# Patient Record
Sex: Female | Born: 1935 | Race: White | Hispanic: No | Smoking: Never smoker
Health system: Southern US, Community
[De-identification: ages and names within clinical notes are randomized; demographics above are authoritative.]

## PROBLEM LIST (undated history)

## (undated) DIAGNOSIS — R001 Bradycardia, unspecified: Secondary | ICD-10-CM

## (undated) DIAGNOSIS — E7800 Pure hypercholesterolemia, unspecified: Secondary | ICD-10-CM

## (undated) DIAGNOSIS — I10 Essential (primary) hypertension: Secondary | ICD-10-CM

## (undated) DIAGNOSIS — I219 Acute myocardial infarction, unspecified: Secondary | ICD-10-CM

## (undated) DIAGNOSIS — Z87898 Personal history of other specified conditions: Secondary | ICD-10-CM

## (undated) DIAGNOSIS — Z95820 Peripheral vascular angioplasty status with implants and grafts: Secondary | ICD-10-CM

## (undated) DIAGNOSIS — I5189 Other ill-defined heart diseases: Secondary | ICD-10-CM

## (undated) DIAGNOSIS — R413 Other amnesia: Secondary | ICD-10-CM

## (undated) DIAGNOSIS — I447 Left bundle-branch block, unspecified: Secondary | ICD-10-CM

## (undated) DIAGNOSIS — I6523 Occlusion and stenosis of bilateral carotid arteries: Secondary | ICD-10-CM

## (undated) DIAGNOSIS — E1140 Type 2 diabetes mellitus with diabetic neuropathy, unspecified: Secondary | ICD-10-CM

## (undated) DIAGNOSIS — K2990 Gastroduodenitis, unspecified, without bleeding: Secondary | ICD-10-CM

## (undated) DIAGNOSIS — I351 Nonrheumatic aortic (valve) insufficiency: Secondary | ICD-10-CM

## (undated) DIAGNOSIS — Z974 Presence of external hearing-aid: Secondary | ICD-10-CM

## (undated) HISTORY — PX: CORONARY ANGIOPLASTY WITH STENT PLACEMENT: SHX49

## (undated) HISTORY — PX: APPENDECTOMY: SHX54

## (undated) HISTORY — PX: ABDOMINAL HYSTERECTOMY: SHX81

---

## 2004-07-17 ENCOUNTER — Ambulatory Visit: Payer: Self-pay | Admitting: Internal Medicine

## 2005-09-18 ENCOUNTER — Ambulatory Visit: Payer: Self-pay | Admitting: Internal Medicine

## 2006-04-20 DIAGNOSIS — I219 Acute myocardial infarction, unspecified: Secondary | ICD-10-CM

## 2006-04-20 HISTORY — DX: Acute myocardial infarction, unspecified: I21.9

## 2006-08-11 ENCOUNTER — Ambulatory Visit: Payer: Self-pay | Admitting: Gastroenterology

## 2006-09-20 ENCOUNTER — Ambulatory Visit: Payer: Self-pay | Admitting: Internal Medicine

## 2006-10-24 ENCOUNTER — Other Ambulatory Visit: Payer: Self-pay

## 2006-10-25 ENCOUNTER — Other Ambulatory Visit: Payer: Self-pay

## 2006-10-25 ENCOUNTER — Inpatient Hospital Stay: Payer: Self-pay | Admitting: Cardiology

## 2006-10-26 ENCOUNTER — Other Ambulatory Visit: Payer: Self-pay

## 2006-12-06 ENCOUNTER — Other Ambulatory Visit: Payer: Self-pay

## 2006-12-06 ENCOUNTER — Inpatient Hospital Stay: Payer: Self-pay | Admitting: Specialist

## 2006-12-07 ENCOUNTER — Other Ambulatory Visit: Payer: Self-pay

## 2006-12-08 ENCOUNTER — Other Ambulatory Visit: Payer: Self-pay

## 2006-12-22 ENCOUNTER — Other Ambulatory Visit: Payer: Self-pay

## 2006-12-22 ENCOUNTER — Inpatient Hospital Stay: Payer: Self-pay | Admitting: Internal Medicine

## 2007-01-05 ENCOUNTER — Encounter: Payer: Self-pay | Admitting: Cardiology

## 2007-01-19 ENCOUNTER — Encounter: Payer: Self-pay | Admitting: Cardiology

## 2007-02-19 ENCOUNTER — Encounter: Payer: Self-pay | Admitting: Cardiology

## 2007-03-15 ENCOUNTER — Ambulatory Visit: Payer: Self-pay | Admitting: Internal Medicine

## 2007-03-21 ENCOUNTER — Encounter: Payer: Self-pay | Admitting: Cardiology

## 2007-04-21 ENCOUNTER — Encounter: Payer: Self-pay | Admitting: Cardiology

## 2007-09-22 ENCOUNTER — Ambulatory Visit: Payer: Self-pay | Admitting: Internal Medicine

## 2008-09-24 ENCOUNTER — Ambulatory Visit: Payer: Self-pay | Admitting: Internal Medicine

## 2009-02-01 ENCOUNTER — Inpatient Hospital Stay: Payer: Self-pay | Admitting: Internal Medicine

## 2009-09-25 ENCOUNTER — Ambulatory Visit: Payer: Self-pay | Admitting: Internal Medicine

## 2009-11-29 ENCOUNTER — Ambulatory Visit: Payer: Self-pay | Admitting: Internal Medicine

## 2010-05-24 ENCOUNTER — Inpatient Hospital Stay: Payer: Self-pay | Admitting: Internal Medicine

## 2010-09-29 ENCOUNTER — Ambulatory Visit: Payer: Self-pay | Admitting: Internal Medicine

## 2010-10-06 ENCOUNTER — Ambulatory Visit: Payer: Self-pay | Admitting: Internal Medicine

## 2010-12-30 ENCOUNTER — Inpatient Hospital Stay: Payer: Self-pay | Admitting: Internal Medicine

## 2011-05-17 ENCOUNTER — Emergency Department: Payer: Self-pay | Admitting: *Deleted

## 2011-05-18 LAB — COMPREHENSIVE METABOLIC PANEL
Alkaline Phosphatase: 94 U/L (ref 50–136)
Bilirubin,Total: 0.3 mg/dL (ref 0.2–1.0)
Chloride: 103 mmol/L (ref 98–107)
Co2: 28 mmol/L (ref 21–32)
Creatinine: 1.06 mg/dL (ref 0.60–1.30)
EGFR (Non-African Amer.): 53 — ABNORMAL LOW
Osmolality: 288 (ref 275–301)
Sodium: 142 mmol/L (ref 136–145)
Total Protein: 7.6 g/dL (ref 6.4–8.2)

## 2011-05-18 LAB — CBC
HCT: 40.2 % (ref 35.0–47.0)
HGB: 13.7 g/dL (ref 12.0–16.0)
MCH: 33.1 pg (ref 26.0–34.0)
MCHC: 34.1 g/dL (ref 32.0–36.0)
RBC: 4.15 10*6/uL (ref 3.80–5.20)

## 2011-05-18 LAB — CK TOTAL AND CKMB (NOT AT ARMC): CK-MB: 2.2 ng/mL (ref 0.5–3.6)

## 2011-05-18 LAB — TROPONIN I: Troponin-I: <0.02 ng/mL

## 2011-09-30 ENCOUNTER — Ambulatory Visit: Payer: Self-pay | Admitting: Internal Medicine

## 2011-12-08 ENCOUNTER — Encounter (HOSPITAL_COMMUNITY): Payer: Self-pay | Admitting: *Deleted

## 2011-12-08 ENCOUNTER — Emergency Department (HOSPITAL_COMMUNITY)
Admission: EM | Admit: 2011-12-08 | Discharge: 2011-12-08 | Disposition: A | Discharge status: Home / Self Care | Payer: Medicare Other | Attending: Emergency Medicine | Admitting: Emergency Medicine

## 2011-12-08 DIAGNOSIS — I10 Essential (primary) hypertension: Secondary | ICD-10-CM | POA: Insufficient documentation

## 2011-12-08 DIAGNOSIS — Z9861 Coronary angioplasty status: Secondary | ICD-10-CM | POA: Insufficient documentation

## 2011-12-08 DIAGNOSIS — E7800 Pure hypercholesterolemia, unspecified: Secondary | ICD-10-CM | POA: Insufficient documentation

## 2011-12-08 DIAGNOSIS — I252 Old myocardial infarction: Secondary | ICD-10-CM | POA: Insufficient documentation

## 2011-12-08 HISTORY — DX: Pure hypercholesterolemia, unspecified: E78.00

## 2011-12-08 HISTORY — DX: Essential (primary) hypertension: I10

## 2011-12-08 HISTORY — DX: Peripheral vascular angioplasty status with implants and grafts: Z95.820

## 2011-12-08 HISTORY — DX: Acute myocardial infarction, unspecified: I21.9

## 2011-12-08 LAB — CBC
HCT: 39.6 % (ref 36.0–46.0)
Hemoglobin: 14.1 g/dL (ref 12.0–15.0)
MCH: 32.9 pg (ref 26.0–34.0)
MCHC: 35.6 g/dL (ref 30.0–36.0)
MCV: 92.3 fL (ref 78.0–100.0)
Platelets: 323 10*3/uL (ref 150–400)
RBC: 4.29 MIL/uL (ref 3.87–5.11)
RDW: 13.7 % (ref 11.5–15.5)
WBC: 8 10*3/uL (ref 4.0–10.5)

## 2011-12-08 LAB — POCT I-STAT, CHEM 8
Calcium, Ion: 1.22 mmol/L (ref 1.13–1.30)
Chloride: 105 mEq/L (ref 96–112)
HCT: 42 % (ref 36.0–46.0)
Sodium: 142 mEq/L (ref 135–145)

## 2011-12-08 NOTE — ED Provider Notes (Signed)
History     CSN: [id number]  Arrival date & time 12/08/11  2008   First MD Initiated Contact with Patient 12/08/11 2212      Chief Complaint  Patient presents with  . Hypertension    (Consider location/radiation/quality/duration/timing/severity/associated sxs/prior treatment) Patient is a 78 y.o. female presenting with hypertension. The history is provided by the patient.  Hypertension This is a chronic problem. The current episode started more than 1 year ago. The problem occurs intermittently. The problem has been unchanged. Associated symptoms include headaches (frontal). Pertinent negatives include no abdominal pain, chest pain, chills, fever, nausea, urinary symptoms, vertigo, vomiting or weakness. Associated symptoms comments: No shortness of breath.. Exacerbated by: unknown.  Pt has had difficult to control blood pressure for greater than a year.  She has been in the hospital with her sister for the past 2 days.  Over the course of the past 24 hours she has had SBP from the 140's-230.  Past Medical History  Diagnosis Date  . S/P angioplasty with stent   . Myocardial infarction 2008  . Hypertension   . Hypercholesteremia     Past Surgical History  Procedure Date  . Coronary angioplasty with stent placement   . Abdominal hysterectomy   . Appendectomy     No family history on file.  History  Substance Use Topics  . Smoking status: Never Smoker   . Smokeless tobacco: Not on file  . Alcohol Use: No    OB History    Grav Para Term Preterm Abortions TAB SAB Ect Mult Living                  Review of Systems  Constitutional: Negative for fever and chills.  Eyes: Negative.  Negative for visual disturbance.  Respiratory: Negative.  Negative for shortness of breath.   Cardiovascular: Negative for chest pain, palpitations and leg swelling.  Gastrointestinal: Negative.  Negative for nausea, vomiting, abdominal pain, diarrhea and constipation.  Genitourinary:  Negative.  Negative for dysuria, urgency, frequency and difficulty urinating.  Musculoskeletal: Negative.   Skin: Negative.   Neurological: Positive for headaches (frontal). Negative for vertigo and weakness.  All other systems reviewed and are negative.    Allergies  Terbinafine and related  Home Medications   Current Outpatient Rx  Name Route Sig Dispense Refill  . ACETAMINOPHEN 500 MG PO TABS Oral Take 500 mg by mouth every 6 (six) hours as needed. For pain    . ALPRAZOLAM 0.25 MG PO TABS Oral Take 0.125 mg by mouth 2 (two) times daily.    . ASPIRIN EC 81 MG PO TBEC Oral Take 81 mg by mouth daily.    Marland Kitchen CLONIDINE HCL 0.1 MG/24HR TD PTWK Transdermal Place 1 patch onto the skin once a week.    Marland Kitchen CLONIDINE HCL 0.1 MG PO TABS Oral Take 0.1 mg by mouth daily.    Marland Kitchen CLOPIDOGREL BISULFATE 75 MG PO TABS Oral Take 75 mg by mouth daily.    Marland Kitchen HYDRALAZINE HCL 50 MG PO TABS Oral Take 50 mg by mouth 2 (two) times daily.    . IBUPROFEN 200 MG PO TABS Oral Take 200 mg by mouth every 6 (six) hours as needed. For pain    . ISOSORBIDE MONONITRATE ER 60 MG PO TB24 Oral Take 60 mg by mouth daily.    Marland Kitchen LANSOPRAZOLE 30 MG PO CPDR Oral Take 30 mg by mouth daily.    Marland Kitchen NITROGLYCERIN 0.4 MG SL SUBL Sublingual Place 0.4 mg under  the tongue every 5 (five) minutes as needed. For chest pain    . OMEGA-3-ACID ETHYL ESTERS 1 G PO CAPS Oral Take 1 g by mouth daily.    Marland Kitchen ROSUVASTATIN CALCIUM 20 MG PO TABS Oral Take 20 mg by mouth daily.    Marland Kitchen TIMOLOL HEMIHYDRATE 0.5 % OP SOLN Both Eyes Place 1 drop into both eyes every evening.    . TORSEMIDE 10 MG PO TABS Oral Take 10 mg by mouth every morning.    Marland Kitchen VERAPAMIL HCL ER 180 MG PO CP24 Oral Take 180 mg by mouth at bedtime.      BP 199/75  Pulse 99  Temp 98.3 F (36.8 C) (Oral)  Resp 18  SpO2 100%  Physical Exam  Nursing note and vitals reviewed. Constitutional: She is oriented to person, place, and time. She appears well-developed and well-nourished. No distress.   HENT:  Head: Normocephalic and atraumatic.  Eyes: Conjunctivae and EOM are normal. Pupils are equal, round, and reactive to light.  Fundoscopic exam:      The right eye shows no hemorrhage and no papilledema.       The left eye shows no hemorrhage and no papilledema.  Neck: Neck supple.  Cardiovascular: Normal rate, regular rhythm, normal heart sounds and intact distal pulses.   Pulmonary/Chest: Effort normal and breath sounds normal. She has no wheezes. She has no rales.  Abdominal: Soft. She exhibits no distension. There is no tenderness.  Musculoskeletal: Normal range of motion.  Neurological: She is alert and oriented to person, place, and time. She has normal strength. No cranial nerve deficit or sensory deficit. Coordination and gait normal.  Skin: Skin is warm and dry.    ED Course  Procedures (including critical care time)  Labs Reviewed  POCT I-STAT, CHEM 8 - Abnormal; Notable for the following:    Glucose, Bld 144 (*)     All other components within normal limits  CBC   No results found.   1. Hypertension       MDM  78 yo female with PMHx of poorly controlled HTN presents to the ED for hypertension.  She has been staying with her sister in the hospital for the past 2 days and has not taken her blood pressure medication at consistent times.  Over the past 24 hours SBP has ranged from 140-230.  RN on the floor took BP and recommended she come to the ED.  Pt complains only of mild frontal headache.  No visual changes, chest pain, shortness of breath, abdominal pain, urinary sx, or focal neurologic deficits.  AF, BP 190's/70's at time of exam, NAD.  Physical exam unremarkable with nml neurologic exam.  Nml fundoscopic exam w/o signs of hemorrhage or papilledema.  Cr wnl.  No signs of end organ damage to suggest hypertensive emergency.  Given history of labile blood pressure and no signs of hypertensive emergency, will not give antihypertensives at this time.  Recommend close  PCP follow-up for further evaluation and management of blood pressure.  Tx plan discussed with pt who voiced understanding and will follow-up.  Strong return precautions provided.        Cherre Robins, MD 12/09/11 [phone]

## 2011-12-08 NOTE — ED Notes (Signed)
Pt has been on the 3rd floor with her sick sister since yesterday. Her bp has been fluctuating b/w systolic 140's and 230's.  The RN took her bp upstairs and it was 238/97 and told her to come downstairs.  Pt states she is under a lot of stress taking care of her sister.  Pt states for several minutes her leg has been feeling "different", not numbness or tingling.  Pt c/o occipital headache 1/10.  No facial droop.  Grips equal bil.

## 2011-12-08 NOTE — ED Notes (Signed)
Pt. Here for HBP. Reports taking her BP medicine as normal but "I've been under a lot of stress lately". States her leg felt "weird". Denies numbness or pain. Reports hx of same.

## 2011-12-08 NOTE — ED Notes (Signed)
Pt given gingerale and crackers b/c she had not eaten anything since this am.

## 2011-12-08 NOTE — ED Notes (Signed)
BP re-taken and it is increaseing to 220/90.  Acuity increased to a 2.  Pt denies any new s/s

## 2011-12-08 NOTE — ED Notes (Signed)
Resident at bedside.  

## 2011-12-13 NOTE — ED Provider Notes (Signed)
I saw and evaluated the patient, reviewed the resident's note and I agree with the findings and plan.  78 year old female with poorly controlled hypertension. Asymptomatic. There is no evidence of acute end organ damage. On exam NAD, Lungs clear, irreg irreg w/o murmur, speech clear and content appropriate, abd soft and NT.  No indication for emergent reduction of her blood pressure. Patient has a PCP who she seems of good relationship with. Patient already on multiple blood pressure medications. Will defer further management to her PCP.  Raeford Razor, MD 12/13/11 [phone]

## 2011-12-14 ENCOUNTER — Ambulatory Visit: Payer: Self-pay | Admitting: Internal Medicine

## 2011-12-14 LAB — COMPREHENSIVE METABOLIC PANEL
Albumin: 4.5 g/dL (ref 3.4–5.0)
Anion Gap: 12 (ref 7–16)
Bilirubin,Total: 0.6 mg/dL (ref 0.2–1.0)
Calcium, Total: 9.6 mg/dL (ref 8.5–10.1)
Creatinine: 1.09 mg/dL (ref 0.60–1.30)
Glucose: 133 mg/dL — ABNORMAL HIGH (ref 65–99)
Osmolality: 276 (ref 275–301)
Potassium: 3.2 mmol/L — ABNORMAL LOW (ref 3.5–5.1)
Sodium: 137 mmol/L (ref 136–145)
Total Protein: 8 g/dL (ref 6.4–8.2)

## 2011-12-14 LAB — URINALYSIS, COMPLETE
Blood: NEGATIVE
Glucose,UR: NEGATIVE mg/dL (ref 0–75)
Protein: NEGATIVE
RBC,UR: <1 /HPF (ref 0–5)
Specific Gravity: 1.003 (ref 1.003–1.030)
WBC UR: 1 /HPF (ref 0–5)

## 2011-12-14 LAB — CBC
HGB: 13.6 g/dL (ref 12.0–16.0)
Platelet: 294 10*3/uL (ref 150–440)
RBC: 4.19 10*6/uL (ref 3.80–5.20)
WBC: 7.7 10*3/uL (ref 3.6–11.0)

## 2011-12-14 LAB — LIPASE, BLOOD: Lipase: 141 U/L (ref 73–393)

## 2011-12-15 ENCOUNTER — Inpatient Hospital Stay: Payer: Self-pay | Admitting: Internal Medicine

## 2011-12-15 LAB — TROPONIN I: Troponin-I: <0.02 ng/mL

## 2011-12-16 LAB — BASIC METABOLIC PANEL
BUN: 14 mg/dL (ref 7–18)
Chloride: 105 mmol/L (ref 98–107)
Co2: 27 mmol/L (ref 21–32)
Creatinine: 1.16 mg/dL (ref 0.60–1.30)
Potassium: 4.9 mmol/L (ref 3.5–5.1)

## 2011-12-19 ENCOUNTER — Emergency Department: Payer: Self-pay | Admitting: Emergency Medicine

## 2011-12-19 LAB — LIPASE, BLOOD: Lipase: 102 U/L (ref 73–393)

## 2011-12-19 LAB — CBC WITH DIFFERENTIAL/PLATELET
Basophil %: 0.3 %
HCT: 34.3 % — ABNORMAL LOW (ref 35.0–47.0)
HGB: 12 g/dL (ref 12.0–16.0)
Lymphocyte #: 1.1 10*3/uL (ref 1.0–3.6)
Lymphocyte %: 10.2 %
MCHC: 35 g/dL (ref 32.0–36.0)
MCV: 96 fL (ref 80–100)
Monocyte %: 6.2 %
Neutrophil #: 8.8 10*3/uL — ABNORMAL HIGH (ref 1.4–6.5)
RDW: 13.4 % (ref 11.5–14.5)

## 2011-12-19 LAB — COMPREHENSIVE METABOLIC PANEL
Alkaline Phosphatase: 95 U/L (ref 50–136)
Bilirubin,Total: 0.4 mg/dL (ref 0.2–1.0)
Calcium, Total: 9.3 mg/dL (ref 8.5–10.1)
Chloride: 100 mmol/L (ref 98–107)
Co2: 29 mmol/L (ref 21–32)
Creatinine: 1.18 mg/dL (ref 0.60–1.30)
Potassium: 4 mmol/L (ref 3.5–5.1)
Sodium: 136 mmol/L (ref 136–145)

## 2011-12-19 LAB — URINALYSIS, COMPLETE
Ketone: NEGATIVE
Nitrite: NEGATIVE
Ph: 9 (ref 4.5–8.0)
Protein: NEGATIVE
Squamous Epithelial: NONE SEEN
WBC UR: <1 /HPF (ref 0–5)

## 2012-02-04 ENCOUNTER — Ambulatory Visit: Payer: Self-pay | Admitting: Gastroenterology

## 2012-02-08 LAB — PATHOLOGY REPORT

## 2012-08-15 ENCOUNTER — Inpatient Hospital Stay: Payer: Self-pay | Admitting: Family Medicine

## 2012-08-15 LAB — LIPID PANEL
Cholesterol: 176 mg/dL (ref 0–200)
Ldl Cholesterol, Calc: 81 mg/dL (ref 0–100)

## 2012-08-15 LAB — URINALYSIS, COMPLETE
Bilirubin,UR: NEGATIVE
Blood: NEGATIVE
Glucose,UR: 300 mg/dL (ref 0–75)
Ketone: NEGATIVE
Leukocyte Esterase: NEGATIVE
Nitrite: NEGATIVE
Protein: 25
Specific Gravity: 1.015 (ref 1.003–1.030)

## 2012-08-15 LAB — COMPREHENSIVE METABOLIC PANEL
BUN: 17 mg/dL (ref 7–18)
Calcium, Total: 9.1 mg/dL (ref 8.5–10.1)
Co2: 27 mmol/L (ref 21–32)
SGOT(AST): 20 U/L (ref 15–37)
SGPT (ALT): 21 U/L (ref 12–78)
Sodium: 140 mmol/L (ref 136–145)

## 2012-08-15 LAB — CBC
HCT: 37 % (ref 35.0–47.0)
MCH: 32.7 pg (ref 26.0–34.0)
Platelet: 238 10*3/uL (ref 150–440)
RBC: 3.9 10*6/uL (ref 3.80–5.20)

## 2012-08-15 LAB — PROTIME-INR: INR: 0.9

## 2012-08-15 LAB — CK TOTAL AND CKMB (NOT AT ARMC)
CK, Total: 62 U/L (ref 21–215)
CK, Total: 83 U/L (ref 21–215)

## 2012-08-16 LAB — CK TOTAL AND CKMB (NOT AT ARMC)
CK, Total: 75 U/L (ref 21–215)
CK-MB: 1.3 ng/mL (ref 0.5–3.6)

## 2012-08-16 LAB — CBC WITH DIFFERENTIAL/PLATELET
Basophil #: 0.1 10*3/uL (ref 0.0–0.1)
Basophil %: 0.8 %
Eosinophil %: 3.5 %
HCT: 33 % — ABNORMAL LOW (ref 35.0–47.0)
HGB: 11.4 g/dL — ABNORMAL LOW (ref 12.0–16.0)
Lymphocyte #: 2.3 10*3/uL (ref 1.0–3.6)
Lymphocyte %: 28.8 %
MCH: 32.6 pg (ref 26.0–34.0)
Monocyte #: 0.7 x10 3/mm (ref 0.2–0.9)
Neutrophil #: 4.5 10*3/uL (ref 1.4–6.5)
Neutrophil %: 57.4 %
Platelet: 235 10*3/uL (ref 150–440)
RBC: 3.48 10*6/uL — ABNORMAL LOW (ref 3.80–5.20)

## 2012-08-16 LAB — BASIC METABOLIC PANEL
Anion Gap: 6 — ABNORMAL LOW (ref 7–16)
BUN: 21 mg/dL — ABNORMAL HIGH (ref 7–18)
Calcium, Total: 8.5 mg/dL (ref 8.5–10.1)
Chloride: 108 mmol/L — ABNORMAL HIGH (ref 98–107)
Co2: 25 mmol/L (ref 21–32)
Creatinine: 1.43 mg/dL — ABNORMAL HIGH (ref 0.60–1.30)
EGFR (African American): 40 — ABNORMAL LOW
EGFR (Non-African Amer.): 35 — ABNORMAL LOW
Glucose: 129 mg/dL — ABNORMAL HIGH (ref 65–99)
Sodium: 139 mmol/L (ref 136–145)

## 2012-08-16 LAB — TROPONIN I: Troponin-I: <0.02 ng/mL

## 2012-09-06 ENCOUNTER — Ambulatory Visit: Payer: Self-pay | Admitting: Vascular Surgery

## 2012-09-30 ENCOUNTER — Ambulatory Visit: Payer: Self-pay | Admitting: Internal Medicine

## 2013-10-09 DIAGNOSIS — I6529 Occlusion and stenosis of unspecified carotid artery: Secondary | ICD-10-CM | POA: Insufficient documentation

## 2013-10-09 DIAGNOSIS — I10 Essential (primary) hypertension: Secondary | ICD-10-CM | POA: Insufficient documentation

## 2013-10-10 ENCOUNTER — Ambulatory Visit: Payer: Self-pay | Admitting: Internal Medicine

## 2013-10-30 DIAGNOSIS — Z9889 Other specified postprocedural states: Secondary | ICD-10-CM | POA: Insufficient documentation

## 2014-08-07 NOTE — Consult Note (Signed)
Chief Complaint:   Subjective/Chief Complaint Please see full GI consult.  Patietn seen and examined, chart reviewed.  Patietn admitted with rlq pain and uncontrolled HTN.  Curently denies abdominal pain. tolerating regualr diet.  Plans for o/p colonoscopy noted, apparently already has an appointment with GI.  Currently no abdominal pain, abdominal exam normal.  See above.   VITAL SIGNS/ANCILLARY NOTES:  **Vital Signs.:   27-Aug-13 17:55   Temperature Temperature (F) 98.4   Celsius 36.8   Temperature Source Oral   Pulse Pulse 67   Systolic BP Systolic BP 726   Diastolic BP (mmHg) Diastolic BP (mmHg) 70   Mean BP 87   Pulse Ox % Pulse Ox % 96   Pulse Ox Activity Level  At rest   Oxygen Delivery Room Air/ 21 %  *Intake and Output.:   27-Aug-13 14:32   Stool  Large loose stool   Brief Assessment:   Cardiac Regular     Respiratory clear BS     Gastrointestinal details normal Soft  Nontender  Nondistended  No masses palpable  Bowel sounds normal    Lab Results:  Hepatic:  26-Aug-13 19:43    Bilirubin, Total 0.6   Alkaline Phosphatase 107   SGPT (ALT) 24   SGOT (AST) 19   Total Protein, Serum 8.0   Albumin, Serum 4.5  Routine Chem:  26-Aug-13 19:43    Lipase 141 (Result(s) reported on 14 Dec 2011 at 08:34PM.)   Glucose, Serum  133   BUN 14   Creatinine (comp) 1.09   Sodium, Serum 137   Potassium, Serum  3.2   Chloride, Serum  97   CO2, Serum 28   Calcium (Total), Serum 9.6   Osmolality (calc) 276   eGFR (African American)  56   eGFR (Non-African American)  49 (eGFR values <89m/min/1.73 m2 may be an indication of chronic kidney disease (CKD). Calculated eGFR is useful in patients with stable renal function. The eGFR calculation will not be reliable in acutely ill patients when serum creatinine is changing rapidly. It is not useful in  patients on dialysis. The eGFR calculation may not be applicable to patients at the low and high extremes of body sizes,  pregnant women, and vegetarians.)   Anion Gap 12  Cardiac:  26-Aug-13 19:43    CK, Total 79   CPK-MB, Serum 2.8 (Result(s) reported on 15 Dec 2011 at 02:49AM.)   Troponin I < 0.02 (0.00-0.05 0.05 ng/mL or less: NEGATIVE  Repeat testing in 3-6 hrs  if clinically indicated. >0.05 ng/mL: POTENTIAL  MYOCARDIAL INJURY. Repeat  testing in 3-6 hrs if  clinically indicated. NOTE: An increase or decrease  of 30% or more on serial  testing suggests a  clinically important change)  Routine UA:  26-Aug-13 19:43    Color (UA) Colorless   Clarity (UA) Clear   Glucose (UA) Negative   Bilirubin (UA) Negative   Ketones (UA) Trace   Specific Gravity (UA) 1.003   Blood (UA) Negative   pH (UA) 6.0   Protein (UA) Negative   Nitrite (UA) Negative   Leukocyte Esterase (UA) Negative (Result(s) reported on 14 Dec 2011 at 08:25PM.)   RBC (UA) <1 /HPF   WBC (UA) 1 /HPF   Bacteria (UA) TRACE   Epithelial Cells (UA) NONE SEEN   Mucous (UA) PRESENT (Result(s) reported on 14 Dec 2011 at 08:25PM.)  Routine Hem:  26-Aug-13 19:43    WBC (CBC) 7.7   RBC (CBC) 4.19   Hemoglobin (CBC)  13.6   Hematocrit (CBC) 39.8   Platelet Count (CBC) 294 (Result(s) reported on 14 Dec 2011 at 08:25PM.)   MCV 95   MCH 32.4   MCHC 34.1   RDW 13.4   Electronic Signatures: Loistine Simas (MD)  (Signed 27-Aug-13 18:58)  Authored: Chief Complaint, VITAL SIGNS/ANCILLARY NOTES, Brief Assessment, Lab Results   Last Updated: 27-Aug-13 18:58 by Loistine Simas (MD)

## 2014-08-07 NOTE — Consult Note (Signed)
PATIENT NAME:  [name], CALVARIO MR#:  [id number] DATE OF BIRTH:  May 07, 1930  DATE OF CONSULTATION:  12/15/2011  CONSULTING PHYSICIAN:  Christena Deem, MD/Nairi Oswald Denman George, NP PRIMARY CARE PHYSICIAN: Bethann Punches, MD   HISTORY OF PRESENT ILLNESS: Ms. [name] is a very pleasant 81 year old Caucasian woman with a history of coronary artery disease, myocardial infarction, cardiac stenting in 2008, chronic constipation, gastroesophageal reflux disease, diverticulosis, hysterectomy, for  evaluation of right lower quadrant pain. She was admitted last night after a hypertensive episode during CT and noted bradycardia while ambulating. She states she is feeling well at present and is not having abdominal pain. She does report the right lower quadrant pain did onset approximately 2 to 3 weeks ago and is unsure what started it. She states it increased with walking. It did increase over the last two days. She thought she had diverticulitis, so she put herself on a clear liquid diet and saw Dr. Hyacinth Meeker yesterday. She did undergo CT that was negative for diverticulitis or other acute abdominal pathology. She states she does have a history of chronic constipation for which she takes daily prune juice, Colace colon helper, and regular but not daily senna. She states that she has had abdominal discomfort due to constipation in the past, but the pain she had yesterday and the day prior was the worst it has ever been, unsure what has relieved it. She did have a dose of milk of magnesia today prior to my arrival as she states it has been several days since her last good bowel movement. She states that she was not having any other associated symptoms with her abdominal pain other than constipation. She reports 1 to 2 bowel movements weekly, usually hard, and sometimes she has to self disimpact. She has not been using MiraLax or Citrucel. She does strain to defecate. Her lab work has been unremarkable with the exception of  hypokalemia which was to be repleted. Her mother did die due to colon cancer diagnosed in her 3s. The patient has had a colonoscopy in 2003 and 2008 which revealed diverticula but no polyps. She states she has received a letter to repeat colonoscopy as outpatient but has not scheduled this appointment yet.   PAST MEDICAL/SURGICAL HISTORY:  1. Coronary artery disease, status post myocardial infarction, cardiac stenting in 2008. 2. Gastroesophageal reflux disease.  3. Hypertension. 4. Hyperlipidemia. 5. Glaucoma. 6. Diverticulitis.  7. Chronic constipation. 8. Diverticulosis. 9. Diet-controlled diabetes. 10. Hysterectomy. 11. Questionable appendectomy. The patient thinks this was removed when she was 81 years old.   MEDICATIONS:  1. Alprazolam 0.25 mg to 0.5 mg, 1 tab b.i.d. as needed.  2. ASA 81 mg p.o. daily.  3. Plavix 75 mg p.o. daily.  4. Hydralazine 50 mg p.o. q.i.d.  5. Isosorbide mononitrate 60 mg p.o. daily.  6. Lansoprazole 30 mg p.o. b.i.d.  7. Torsemide 10 mg p.o. daily.  8. Clonidine patch 0.2 mg weekly Saturdays. 9. Clonidine tablet 0.1 mg p.o. daily in the p.m.  10. Crestor 20 mg, 1 tablet p.o. daily.  11. Verapamil 180 mg p.o. daily.  12. Atenolol maleate ophthalmic solution 0.5%, 1 drop OU at bedtime.  13. Tylenol 500 mg, 1 to 2 tabs p.r.n.  14. Ibuprofen 200 mg, 1 tab b.i.d. p.r.n.  15. Stool softener, states this is a generic Colace colon helper caps, 2 tabs p.r.n.  16. Nitrostat 0.4 mg p.r.n.   ALLERGIES: Terbinafine.   FAMILY HISTORY: Pertinent for colon cancer in mother. Coronary artery  disease and myocardial in father, also hypertension. No known history of liver disease or ulcers.   SOCIAL HISTORY: No tobacco, EtOH or illicits. She lives with her husband.   REVIEW OF SYSTEMS: A 10-point review is negative with the exception of recent labile blood pressure and events noted in the admitting H and P. In chart review, it looks like she has had several  medication adjustments lately due to high blood pressure  LABORATORY, DIAGNOSTIC AND RADIOLOGICAL DATA: Most recent lab work: Glucose 133, BUN 14, creatinine 1.09, sodium 137, potassium 3.2, GFR 49, lipase 141, calcium 9.6, total serum protein 8.0, serum albumin 4.5, total bilirubin 0.6, alkaline phosphatase 107, AST 19, ALT 24. Troponin less than 0.02, CK 79, CPK-MB 2.8. WBC 7.7, hemoglobin 13.6, hematocrit 39.8, platelet count 294, MCV 95, MCH 32.4, MCHC 34.1, RDW 13.4. CT of abdomen and pelvis without contrast yesterday was demonstrative of colonic diverticulosis without evidence of acute diverticulitis. No evidence of acute appendicitis, states the appendix appears normal. No kidney stones or hydronephrosis.   PHYSICAL EXAMINATION:  VITAL SIGNS: Most recent vital signs: Temperature 98.3, pulse 79, respiratory rate 20, blood pressure 159/67, oxygen saturation 98% on room air.   GENERAL: A well-appearing elderly Caucasian woman in bed in no acute distress eating her lunch.   HEENT: Normocephalic, atraumatic. No redness, drainage, or inflammation to the eyes or the nares. Oral mucous membranes are pink and moist.   NECK: Supple. No thyromegaly, adenopathy, JVD.   RESPIRATORY: Respirations eupneic. Lungs CTAB.   CARDIOVASCULAR: S1, S2. Regular rate and rhythm. No murmurs, rubs or gallops. Peripheral pulses 2+. No appreciable edema.   ABDOMEN: Nondistended, active bowel sounds x4, very soft, nontender. No hepatosplenomegaly, peritoneal signs, masses, hernias or other abnormalities.   GENITOURINARY: Deferred.   RECTAL: Deferred.   EXTREMITIES: Moves all extremities well x4. Strength 5/5. No clubbing or cyanosis.   NEUROLOGICAL: Cranial nerves II through XII are grossly intact. Alert and oriented x3. Speech clear. No facial droop.   SKIN: Warm, dry, pink without erythema, lesion or rash.   PSYCHIATRIC: Logical thought, good memory, pleasant, cooperative,   IMPRESSION: Abdominal pain,  improved at present. Differentials include IBS, diverticulosis or other.  RECOMMENDATIONS :  1. Do recommend Citrucel once daily and MiraLax 1 to 2 times a day to help relieve this. This may also help prevent and alleviate further pain. These two agents are preferable over regular stimulant laxative use.  2. She did receive a letter to repeat her colonoscopy. She may repeat this as outpatient, but will require stable, controlled blood pressure, and absence of cardiac symptoms prior to evaluation. 3. Additionally, as the CT report seems to indicate that the patient has an appendix, and the patient thinks she had this removed, I will need to verify with the radiologist to clarify the reading.   These services were provided by Vevelyn Pat, MSN, NPC in collaboration with Christena Deem, MD with whom I have discussed this patient in full.   ____________________________ Keturah Barre, NP chl:cbb D: 12/15/2011 14:54:05 ET T: 12/15/2011 15:49:54 ET JOB#: [id number]  cc: Keturah Barre, NP, <Dictator> Eustaquio Maize Hilary Pundt FNP ELECTRONICALLY SIGNED 12/15/2011 16:22

## 2014-08-07 NOTE — Consult Note (Signed)
Brief Consult Note: Diagnosis: HTN, bradycardia, abdominal pain.   Patient was seen by consultant.   Consult note dictated.   Comments: Appreciate consult for 81 y/o cauc. woman with history of CAD/MI/cardiac stenting 2008, chronic constipation, GERD, diverticulosis, for eval of rlq abdominal pain. Adm last pm, after hypertensive episode during CT and noted bradycardia while ambulating.  States she is feeling well at present, and is not having abdominal pain.  Patient states onset of rlq discomfort 2-3w ago. Unsure what started it. States it increased with walking. Increased over the last 2d, thought she had diverticulitis, so she put herself on a CLIQ diet and saw Dr Hyacinth Meeker yesterday. Had a CT scan that was neg for diverticulitis, other acute abdominal pathology. Does have history of chronic constipation, for which she takes daily prune juice, colace, colon helper, and regular but not daily Senna.  States abdmoninal discomfort due to constipation in past, but the pain she had yesterday and day prior was the worst it has ever been. Unsure what has relieved it. Has had a dose of MOM today prior to my arrival, as she states it has been several days since her last "good" bowel movement.  States that she was not having other associated symptoms with the abdominal pain, other than constipation. Reports 1-2 bowel movements weekly, usually hard, sometimes she has to self-disimpact. No Miralax or Citrucel usage. Strains to defecate.  Her lab work has been unremarkable with the exception of hypoklemia, which was to be repleted. Mother deceased d/t colon cancer in her 51s. Pt had colonoscopy in 2003 & 2008, which revealed diverticula, no polyps. States received letter to rpt colonoscopy as o/p, but has not scheduled appt yet. Impr: Abdominal pain. Improved at present. Diffs incl IBS-C, diverticulosis, other. Do rec Citrucel, once qd, and Miralax 1-2/d to help relieve this. This is preferable over regular stimulant  laxative use.  Also recommend colonoscopy as outpatient at present, however, will need good bp control prior to this. Also will clarify appendix with radiologist, as pt thinks this was removed..  Electronic Signatures: Vevelyn Pat H (NP)  (Signed 27-Aug-13 15:00)  Authored: Brief Consult Note   Last Updated: 27-Aug-13 15:00 by Keturah Barre (NP)

## 2014-08-07 NOTE — H&P (Signed)
PATIENT NAME:  [name], [name] MR#:  [id number] DATE OF BIRTH:  October 30, 1930  DATE OF ADMISSION:  12/15/2011  CHIEF COMPLAINT: Uncontrolled hypertension.   HISTORY OF PRESENT ILLNESS: 81 year old female with chronic medical conditions of hypertension, coronary artery disease and other comorbidities presents with sudden onset of elevated blood pressure. Patient notes that she was in her usual state of health until about two weeks ago when she developed right lower quadrant abdominal pain that is described as a sharp non radiating pain similar to prior bout of diverticulitis that she had years ago. The pain is not exacerbated by meals. The pain is worse with walking and movements. Pain was briefly relieved by a liquid diet that she attempted when presumed diagnosis two weeks ago was recurrent diverticulitis. Pain improved with pain medicines in the ED and currently is a 3/10, dull ache. She presented to her PCP today who recommended a CAT scan of the abdomen. This was obtained and was essentially negative, however,before the CAT scan patient felt flushed and she says that usually when she gets this flushed feeling it is associated with uncontrolled hypertension so she asked that her blood pressure be checked at which point systolics are noted to be greater than 250 so a rapid response was called. She did not have any associated chest pain, headache, blurred vision just the feeling of her face being flushed. Patient has been compliant with her blood pressure medications. In the Emergency Department she has been medicated with her home dose hydralazine and clonidine. She improved, however, when they walked her she became bradycardic with heart rate in the 40s documented by EKG.   Of note, patient does have labile blood pressure particularly when she's stressed her. She has had increased stress recently because her sister just had neck surgery and her husband was in a motor vehicle accident today, actually before her  blood pressure was checked. She notes that after leaving her PCPs office her husband left to go retrieve their vehicle, however, he left her in the hospital parking lot and drove off. Apparently had erratic behavior on the road prompting the police to get involved and the patient's husband backed into the police vehicle.  She has been concerned that her husband has severe dementia.  She notes that she deals with chronic constipation and requires a stool softener and prune juice to help with her bowel movements. She denies nausea or vomiting.  She denies any recent illnesses, dark urine, recent travel.    PAST MEDICAL HISTORY: 1. Coronary artery disease, status post stent placement, status post MI in 2008.  2. Gastroesophageal reflux disease. 3. Hypertension.  4. Hyperlipidemia.  5. Glaucoma.  6. Diverticulitis.  7. History of cerebrovascular accident. 8. History of chronic constipation.  9. History of diverticulosis. 10. Diet controlled diabetes mellitus.   PAST SURGICAL HISTORY:  1. Hysterectomy. 2. Cardiac catheterization with stent placement to RCA.   MEDICATIONS:  1. Alprazolam 0.25 mg 0.5 to 1 tab b.i.d. as needed.  2. Aspirin 81 mg daily.  3. Clopidogril 75 mg daily.  4. Hydralazine 50 mg 4 times a day.  5. Isosorbide mononitrate 60 mg daily.  6. Lansoprazole 30 mg b.i.d.  7. Torsemide 10 mg daily.  8. Clonidine patch 0.2 mg weekly on Saturdays.  9. Clonidine tablet 0.1 mg tab daily in the p.m.   10. Crestor 20 mg 1 tablet daily at bedtime. 11. Verapamil 180 mg 1 tablet at bedtime.  12. Timolol maleate ophthalmic solution, 0.5% one drop  each eye at bedtime.    OVER-THE-COUNTER MEDICATIONS:  1. Tylenol 500 mg 1 to 2 tablets as needed.  2. Ibuprofen 200 mg 1 tab b.i.d. as needed.  3. Stool softener 2 tablets as needed. She takes Engineer, structural brand of Colace.  4. Colon helper caps 2 tablets as needed. 5. Omega 3 1000 mg 1 tab daily.  6. Nitrostat 0.4 mg as needed.    ALLERGIES: Terbinafine, reaction unknown.   FAMILY HISTORY: Notable for hypertension.   SOCIAL HISTORY: Nonsmoker. Denies alcohol or illicit drug use. Lives with her husband.   REVIEW OF SYSTEMS: CONSTITUTIONAL: Denies fevers, fatigue, weight loss or weight gain. EYES: Denies blurred vision, double vision. ENT: Denies tinnitus, ear pain. RESPIRATORY: Denies cough, wheeze, dyspnea. CARDIOVASCULAR: Denies chest pain, orthopnea, edema, palpitations. GASTROINTESTINAL: Admits to nausea today, mild and brief. Denies diarrhea. Admits to abdominal pain. No melena. No rectal bleeding. GENITOURINARY: Denies dysuria, frequency. ENDOCRINE: Denies increased sweating. INTEGUMENT: Denies new skin rashes. MUSCULOSKELETAL: Denies pain in joints or swelling in joints. NEURO: Denies numbness, weakness, or dysarthria. PSYCH: Admits to anxiety and increased stress.  PHYSICAL EXAM:  VITAL SIGNS: Temperature 98, pulse 112, blood pressure 236/108 on presentation, at the time of my evaluation blood pressure 147/47, sating 99% on room air. As I woke her up and she started to recount the events of the day her blood pressure consistently increased with systolics rising into the 160s and ultimately being 191/74.    GENERAL: Elderly Caucasian female in no apparent distress, well appearing.   EYES: Normal eyelid. Anicteric sclera. Pupils equally round and reactive to light and accommodation.   ENT: Normal external ears and nares. Posterior pharynx clear.   NECK: Supple. No thyromegaly.   CARDIOVASCULAR: Normal S1 and S2, regular rate and rhythm. No murmurs appreciated. No pedal edema.   CHEST: Clear to auscultation bilaterally. Normal respiratory effort.   ABDOMEN: Soft, nontender, nondistended. No organomegaly appreciated.   EXTREMITIES: Normal tone. Full muscle strength bilateral upper and lower extremities. Patient is ambulatory.   NEUROLOGICAL: No focal neurological deficits.   SKIN: Without rash, warm and  dry.   PSYCH: Awake, alert, oriented x3. Patient has good insight and judgment at this time. Good eye contact. Appropriate affect.   LABORATORY, DIAGNOSTIC AND RADIOLOGICAL DATA: Lipase 141, glucose 133, BUN 14, creatinine 1.09, sodium 137, potassium 3.2, chloride 97, CO2 28, calcium 9.6, bilirubin 0.6, alkaline phosphatase 107, ALT 24, AST 19, total protein 8, albumin 4.5, osmolality 276, GFR 56, CK 79, CPK-MB 2.8, troponin is less than 0.02.   CBC shows WBC count 7.7, hemoglobin 13.6, hematocrit 39.8, platelets 294, MCV 95.   EKG shows sinus bradycardia at 40 beats per minute.  CAT scan shows colonic diverticulosis without evidence of acute diverticulitis. No evidence of acute appendicitis. No evidence of acute appendicitis. No nephrolithiasis or hydronephrosis.   ASSESSMENT AND PLAN: A 81 year old female presenting with accelerated hypertension, bradycardia, hypokalemia, abdominal pain.  1. Accelerated hypertension. This has been ongoing in the outpatient setting. The patient recently saw her primary care physician for this about a week ago and there were medication changes made. We will continue her current home dose medications and at this point aim for blood pressures less than 160. She is on multiple medications for her medication resistant hypertension. We can consider increasing her clonidine dose instead of adding a diuretic because she is already on it.   2. Abdominal pain. The etiology of this is unclear. Patient does have chronic constipation. This could be as  a result of chronic constipation syndrome or constipation variant irritable bowel syndrome possibly. At this time we have ruled out acute infectious etiology such as diverticulitis, colitis, pancreatitis or any cholestatic disease. I am less concerned for ischemic colitis as her symptoms are not exacerbated nor associated with meals. However, given her history of atherosclerotic disease, work-up can include imaging of the mesenteric  vasculature if initial tests are negative. Currently pain is well controlled. Given history of constipation would avoid opiates. Manage pain with Tylenol or NSAIDs.   3. Bradycardia. I wonder if this is as a result of the medications. At this point will monitor her. I would not stop any AV nodal agents at this time simply because she does have treatment resistant hypertension. We will monitor on telemetry for further episodes.  4. Hypokalemia. This will be repleted.  5. Coronary artery disease. Will continue her aspirin, Plavix, statin and nitrates.  6. Gastroesophageal reflux disease. Will continue her protein pump inhibitor.  7. Constipation. We will add MiraLax in addition to Docusate.  8. Prophylaxis. Lovenox.  9. CODE STATUS: FULL.  10. Decision maker if Patient does not have capacity. Her son Reynaldo Minium. Vonstein.    TIME SPENT ON ADMISSION: 60 minutes.   ____________________________ Aurther Loft, DO aeo:cms D: 12/15/2011 05:18:28 ET T: 12/15/2011 08:56:26 ET JOB#: [id number]  cc: Aurther Loft, DO, <Dictator> Danella Penton, MD Lajune Perine E Arrionna Serena DO ELECTRONICALLY SIGNED 12/16/2011 6:50

## 2014-08-07 NOTE — Discharge Summary (Signed)
PATIENT NAME:  [name], [name] MR#:  [id number] DATE OF BIRTH:  [birth year]  DATE OF ADMISSION:  12/15/2011 DATE OF DISCHARGE:  12/16/2011  DISCHARGE DIAGNOSES:  1. Malignant hypertension.  2. Dehydration.  3. Right lower quadrant abdominal pain due to obstipation.  4. Coronary artery disease.  5. Diabetes mellitus, diet controlled.   DISCHARGE MEDICATIONS:  1. Xanax 0.5 mg b.i.d.  2. Aspirin 81 mg daily.  3. Plavix 75 mg daily.  4. Hydralazine 50 mg b.i.d.  5. Imdur 60 mg daily.  6. Prevacid 30 mg b.i.d.  7. Torsemide 10 mg daily.  8. Clonidine patch TTS-2 weekly clonidine 0.1 mg at bedtime.  9. Crestor 20 mg at bedtime.  10. Verapamil SR 180 mg at bedtime.   REASON FOR ADMISSION: 81 year old female presents with malignant hypertension and abdominal pain. Please see history and physical for history of present illness, past medical history, physical exam.   HOSPITAL COURSE: Patient was admitted. Her blood pressure was related to stressor due to her husband having an auto accident. In the hospital her blood pressure systolics were consistently in the 115 to 120 range on her usual medication. She was given milk of magnesia and her bowels moved well. Her abdominal pain resolved. She will getting a colonoscopy as an outpatient. Abdominal/pelvic CT did not show any abnormality. She will be going home on her usual medications to follow up with GI for outpatient colonoscopy. I talked with her a lot about stress reduction.    FOLLOW UP: Follow up with Dr. Hyacinth Meeker as scheduled.  ____________________________ Danella Penton, MD mfm:cms D: 12/16/2011 07:57:12 ET T: 12/16/2011 13:55:06 ET JOB#: [id number]  cc: Danella Penton, MD, <Dictator> Danella Penton MD ELECTRONICALLY SIGNED 12/17/2011 7:55

## 2014-08-10 NOTE — H&P (Signed)
PATIENT NAME:  [name], [name] MR#:  [id number] DATE OF BIRTH:  01/02/1931  DATE OF ADMISSION:  08/15/2012  ADMITTING PHYSICIAN:  , M.Sloan.   PRIMARY CARE PHYSICIAN: Dr. Mark Miller.   PRIMARY CARDIOLOGIST: Dr. Paraschos.   CHIEF COMPLAINT: Chest pain.   HISTORY OF PRESENT ILLNESS: The patient is a 81-year-old Caucasian female with a past medical history significant for hypertension, diet-controlled diabetes mellitus, diverticulosis and coronary artery disease status post stents, comes to the hospital secondary to chest pain that woke her up this morning. The patient said she was fine up until late last evening. She slept talking to her sister, missing her evening medications, and woke up at 2:30 a.m. in the middle of the night. She did not take her clonidine, and just took her Coreg, went to bed and woke up this morning with chest pain. Chest pain is mid-sternal. She was also nauseous with that. No diaphoresis. Felt  generalized weakness. She went to the bathroom to get her nitro pill. Initially she thought it was muscular chest pain, but it was more tightness in the center of the chest. She took her blood pressure before she took the nitro. It was about 98/54. She went and took her nitro. After the nitro, she felt slightly dizzy, lightheaded, walked to the couch, sat down and passed out, and she did not remember anything until her son started waking her up and called EMS. She still has 2/10 chest tightness right now. She is on nitro paste. She also continues to feel generalized weakness and not herself. Her first set of troponin is negative. Her last stress test by Dr. Paraschos, according to her, could have been around 6 to 8 months ago and according to her it was negative.   PAST MEDICAL HISTORY: 1.  Hypertension.  2.  Diet-controlled diabetes mellitus.  3.  Hyperlipidemia.  4.  CAD, status post LAD and RCA stents in the past.  5.  Diverticulosis and diverticulitis.  6.   Gastroesophageal reflux disease.   PAST SURGICAL HISTORY: 1.  Appendectomy.  2.  Hysterectomy.  3.  Cardiac catheterization and stent placement.   ALLERGIES TO MEDICATIONS: TERBINAFINE.   HOME MEDICATIONS:  1.  Xanax 0.5 mg p.o. b.i.Sloan., and another 0.5 mg as needed for anxiety.  2.  Aspirin 81 mg p.o. daily.  3.  Clonidine 0.1 mg in the evening at 6:00 p.m.  4.  Clonidine 0.2 mg transdermal patch once a week on Saturday.  5.  Plavix 75 mg p.o. daily.  6.  Coreg CR 20 mg p.o. daily in the evening.  7.  Crestor 20 mg p.o. daily.  8.  Hydralazine 50 mg p.o. 3 times a day.  9.  Imdur 60 mg p.o. daily.  10.  Lansoprazole 30 mg p.o. b.i.Sloan.  11.  Sublingual nitroglycerin 0.4 mg p.r.n. for chest pain.  12.  Timolol ophthalmic eyedrops, at bedtime.  13.  Torsemide 10 mg in the morning.   SOCIAL HISTORY: Lives at home with her husband. No smoking or alcohol use.   FAMILY HISTORY: Mom with unknown cancer and also heart disease, and dad with heart disease.   REVIEW OF SYSTEMS:  CONSTITUTIONAL: No fever or fatigue. Positive for generalized weakness.  EYES: Positive for glaucoma. No blurred vision or doubled vision. Uses reading glasses.  ENT: Mild hearing loss. No tinnitus, ear pain, epistaxis or discharge.  RESPIRATORY: No cough, wheeze, hemoptysis or COPD.  CARDIOVASCULAR: Positive for chest pain. No orthopnea, edema, arrhythmia, palpitations. Positive   for syncope.  GASTROINTESTINAL: Positive for nausea. No vomiting, diarrhea, abdominal pain, hematemesis or melena.  GENITOURINARY: No dysuria, hematuria, renal calculus, frequency or incontinence.  ENDOCRINE: No polyuria, nocturia, thyroid problems, heat or cold intolerance.  HEMATOLOGY: No anemia, easy bruising or bleeding.  SKIN: No acne, rash or lesions.  MUSCULOSKELETAL: No neck, back, shoulder pain, arthritis or gout.  NEUROLOGIC: No numbness, weakness, CVA, TIA or seizures.  PSYCHOLOGICAL: No anxiety, insomnia or depression.    PHYSICAL EXAMINATION: VITAL SIGNS: Temperature 97.5 degrees Fahrenheit, pulse 71, respirations 18, blood pressure 197/90, pulse ox 100% on room air.  GENERAL EXAM: Revealed a well-built, well-nourished female lying in bed, not in any acute distress.  HEENT: Normocephalic, atraumatic. Pupils equal, round, reacting to light. Anicteric sclerae. Extraocular movements intact. Oropharynx clear, without erythema, mass or exudates. Dry mucous membranes present.  NECK: Supple. No thyromegaly, JVD, or carotid bruits. No lymphadenopathy.  LUNGS: Moving air bilaterally. No wheeze or crackles. No use of accessory muscles for breathing.  CARDIOVASCULAR: S1, S2 regular rate and rhythm. No murmurs, rubs, or gallops. No chest wall tenderness. No pleuritic chest pain. ABDOMEN: Soft, nontender, nondistended. No hepatosplenomegaly. Normal bowel sounds.  EXTREMITIES: No pedal edema. No clubbing. No cyanosis; 2+ dorsalis pedis pulses palpable bilaterally.  SKIN: No acne, rash or lesions.  LYMPHATIC: No cervical or inguinal lymphadenopathy.  NEUROLOGIC: Cranial nerves intact. No focal motor or sensory deficits.  PSYCHOLOGICAL: The patient is awake, alert, oriented x3.   LAB DATA: WBC 10.2, hemoglobin 12.7, hematocrit 37.0, platelet count 238.   Sodium 140, potassium 4.0, chloride 105, bicarbonate 27, BUN 17, creatinine 1.1, glucose 178, and calcium of 9.1.   ALT 21, AST 20, alk phos 97, total bili 0.4, albumin of 3.7.   Urinalysis negative for any infection.   First set of troponin less than 0.02. BNP is normal at 307.   Chest x-ray revealing mild enlargement of cardiac silhouette, low-grade CHF, no alveolar pneumonia or pleural effusion seen.   CT of the head without contrast showing no acute intracranial process.   EKG showing normal sinus rhythm, heart rate of 70.  ASSESSMENT AND PLAN: A 81 year old female with hypertension, diet-controlled diabetes, coronary artery disease status post stents in the  past, comes in for a syncopal episode and chest pain.   1.  Syncope, likely cardiogenic in nature secondary to her chest pain. It also could be worsened by hypotension after the nitroglycerin. Continue to monitor on telemetry, get a Doppler ultrasound of the carotids.  2.  Chest pain: Sounds like angina. Troponins negative for the first set. We will admit under observation to telemetry. Will cycle troponins, and Myoview in the a.m. Get a cardiology consult and monitor. Continue her medications with aspirin, Plavix, and statin, and also nitro paste. She is not on any beta blockers. Possibly, there is a report of bradycardia the last time she was here.  3.  Coronary artery disease: Again, management as mentioned above, Myoview in the a.m., and cardiology consult. Her last stress test was as an outpatient, according to the patient in the last year, which was negative. Her last cardiac catheterization in September 2012 revealing patent stents and distal right coronary artery 60% stenosis.  4.  Accelerated hypertension: Continue home medication.On clonidine, coreg, Imdur and Hydralazine.  Intravenous hydralazine p.r.n. here for systolic blood pressure greater than 170.  5.  Diet-controlled diabetes mellitus: Follow up HbA1c and on sliding-scale insulin for now.  6.  Hyperlipemia: On Crestor.  7.  Gastrointestinal and deep  venous thrombosis prophylaxis: On Protonix b.i.Sloan. and subcutaneous heparin.   Code status of the patient is FULL CODE.   Time spent on admission is 50 minutes.   ____________________________ Gladstone Lighter, MD rk:dm Sloan: 08/15/2012 12:35:00 ET T: 08/15/2012 13:03:11 ET JOB#: [id number]  cc: Rusty Aus, MD Gladstone Lighter, MD, <Dictator>   Gladstone Lighter MD ELECTRONICALLY SIGNED 08/18/2012 15:41

## 2014-08-10 NOTE — Consult Note (Signed)
PATIENT NAME:  [name], [name] MR#:  [id number] DATE OF BIRTH:  November 21, 1930  DATE OF CONSULTATION:  08/15/2012  REFERRING PHYSICIAN: Danella Penton, MD  CONSULTING PHYSICIAN:  Shai Mckenzie D. Juliann Pares, MD  CARDIOLOGIST: Marcina Millard, MD  INDICATION: Chest pain.   HISTORY OF PRESENT ILLNESS: The patient is a 81 year old white female with past medical history of hypertension, diet-controlled diabetes, diverticulosis, coronary artery disease, prior stents, who came to the hospital with chest pain, which she recently had not had. It woke her up. She has had recurrent left-sided chest pain off and on. She said that she was pain-free up until last evening. She slept relatively early and forgot to take her medicines, so when she got up, she took her Coreg. She had forgotten her clonidine and Coreg, but took her Coreg. Later on that evening, her blood pressure became low, and she felt unsteady, but still had chest pain, so in that instance, she took nitroglycerin, which even lowered her blood pressure further. She had a little bit of headache and may have had a syncopal or blackout spell. Her son called rescue. Her pain at the time was 2/10 or 3/10, but with her low blood pressure after the nitroglycerin and a possible syncopal episode, she was brought to the Emergency Room for evaluation and therapy. While in the Emergency Room, she still had some chest pain, but felt better. Her blood pressure started to elevate, and she became hypertensive at one point. Of note, she had a visit with Dr. Darrold Junker several months ago, which included a stress test 6 to 8 months ago, which reportedly was okay.   REVIEW OF SYSTEMS: Recent blackout spell or syncope possibly related to medication. Denies weight loss, weight gain. No hemoptysis or hematemesis. No bright red blood per rectum. Denies vision change, hearing change. Denies sputum production or cough.   PAST MEDICAL HISTORY:  1. Hypertension.  2. Diet-controlled  diabetes.  3. Hyperlipidemia.  4. Coronary artery disease. 5. Mild obesity.  6. Diverticulosis.  7. Reflux.   PAST SURGICAL HISTORY:  1. Appendectomy.  2. Hysterectomy.  3. Cardiac catheterization with angioplasty and stent.   ALLERGIES: TERBINAFINE.   MEDICATIONS:  1. Xanax 0.5 twice a day p.r.n.  2. Aspirin 81 mg a day.  3. Clonidine 0.1 in the evening.  4. Clonidine 0.2 transdermal patch once a week on Saturday. 6. Plavix 75 a day. 7. Coreg 20 mg daily. 8. Crestor 20 a day.  9. Hydralazine 50 mg 2 times a day. 10. Imdur 60. 12. Lansoprazole 20 mg twice a day.  13. Sublingual nitroglycerin p.r.n.  14. Timolol eyedrops at bedtime.  15. Torsemide 10 mg a day.   FAMILY HISTORY: Cancer, heart disease.   SOCIAL HISTORY: Lives at home with her husband. No smoking or alcohol consumption.   PHYSICAL EXAMINATION:  VITAL SIGNS: Blood pressure was 200/90 in the Emergency Room, pulse of 70, respiratory rate of 16, blood pressure now is 160/70.  HEENT: Normocephalic, atraumatic. Pupils equal and reactive to light.  NECK: Supple. No JVD, bruits or adenopathy.  LUNGS: Clear to auscultation and percussion. No significant wheeze, rhonchi or rale.  HEART: Regular rate and rhythm. Systolic ejection murmur at the apex. Positive S4. PMI nondisplaced.  ABDOMEN: Benign.  EXTREMITIES: Within normal limits.  NEUROLOGIC: Intact.  SKIN: Normal.   LABORATORIES: White count of 10, hemoglobin is 12.7, hematocrit 37. Sodium 140, potassium 4.0, chloride of 105, BUN of 17, creatinine 1.1, glucose of 178, calcium 9.1. LFTs are negative.  UA was negative. Troponin less than 0.02. BNP 307. Chest x-ray: Possible low-grade CHF, but otherwise unremarkable. CT of the head was negative. EKG: Normal sinus rhythm of 70, nonspecific findings.   ASSESSMENT:  1. Unstable angina, intermittent chest pain.  2. Hypertension, malignant.  3. Obesity.  4. Coronary artery disease.  5. Diet-controlled diabetes.  6.  Hyperlipidemia.   PLAN: Agree with admit. Rule out for myocardial infarction. Continue cardiac evaluation, with beta blockers, clonidine, hydralazine. Maintain stricter blood pressure control. Follow up cardiac enzymes. Follow up EKG. Consider functional study. I suspect her blackout spells are related to nitroglycerin dropping her blood pressure. I do not recommend further evaluation with past CT of the head and carotid Dopplers. I doubt neurology consultation would be helpful. Continue blood pressure control as mentioned. Continue lipid management. She states she is unable to tolerate the statin. Continue coronary artery disease therapy with aspirin. Continue Plavix therapy. For anxiety, continue Xanax. For her reflux, continue lansoprazole. Continue Imdur for angina. She has been on Crestor, but states that she has not been able to tolerate it. Continue clonidine for blood pressure. Increase activity. Proceed with functional study, but looks okay. Will treat the patient medically for now and base further evaluation and further studies on tests.    ____________________________ Bobbie Stack Juliann Pares, MD ddc:OSi D: 08/16/2012 00:05:42 ET T: 08/16/2012 06:34:12 ET JOB#: [id number]  cc: Ciarrah Rae D. Juliann Pares, MD, <Dictator> Alwyn Pea MD ELECTRONICALLY SIGNED 09/20/2012 7:22

## 2014-08-10 NOTE — Discharge Summary (Signed)
PATIENT NAME:  [name], [name] MR#:  [id number] DATE OF BIRTH:  07-19-1930  DATE OF ADMISSION:  08/15/2012  DATE OF DISCHARGE:   08/18/2012  DISCHARGE DIAGNOSES: 1.  Chest pain that is noncardiac.  2.  Episode of syncope with hypotension.  3.  Malignant hypertension.   DISCHARGE MEDICATIONS: 1.  Alprazolam 0.5 mg 1 tab p.o. b.i.d. as needed for anxiety.  2.  Clopidogrel 75 mg p.o. daily.  3.  Clonidine 0.1 mg p.o. daily.  4.  Clonidine 0.2 mg transdermal patch once a week.  5.  Crestor 20 mg p.o. at bedtime.  6.  Hydralazine 50 mg p.o. t.i.d.  7.  Imdur 60 mg p.o. daily.  8.  Lansoprazole 30 mg p.o. 1 to 2 times a day as needed.  9.  Aspirin 81 mg p.o. daily.  10.  Timolol ophthalmic 0.5% 1 drop each eye at bedtime.  11.  Coreg 20 mg extended-release 1 capsule daily.   CONSULTATIONS:  Dr. Juliann Pares, per cardiology.   PROCEDURES:  Had a catheterization performed on 08/17/2012. Catheterization showed proximal LAD 60 % to 70% mid stent patent. RCA mid stent patent, EF of 55%. Bilateral renals were okay.   PERTINENT LABORATORIES AND STUDIES: On day of discharge, the patient had no lab work performed. Prior to discharge, the patient had a creatinine of 1.43, sodium 139, potassium 3.9. Cardiac enzymes were negative x 3. White blood cell count 7.9, hemoglobin 11.4, platelets 235.   BRIEF HOSPITAL COURSE:  1.  Chest pain: The patient initially came in with some chest discomfort with history of coronary artery disease in the past with stent placement. She was evaluated by cardiology. No significant EKG changes and no cardiac enzyme elevation was noted with negative troponin as well. She was placed under cardiology care and underwent catheterization, which did not show an acute blockage and no stenting was necessary. Prior stents were patent. Medical management was recommended by Dr. Juliann Pares, and it is thought that her chest pain is noncardiac, possibly MSK-related or anxiety related.  2.   Syncopal episode: The patient initially came in with a syncopal episode and hypotension. It was thought that was due to medication overdose, taking excessive amounts of Coreg against direction. Her blood pressure did rebound and did improve; however, she subsequently  developed malignant hypertension. The hydralazine was restarted, and her blood pressure returned to the 150s, and she was asymptomatic. Prior to discharge, the patient continued to have intermittent chest discomfort, but was cleared by Dr. Juliann Pares for discharge to home.   DISPOSITION: She is stable enough to be discharged home. Follow up with Dr. Hyacinth Meeker next week for further evaluation as needed. The patient will likely need evaluation of her carotid Dopplers at some point in the near future once her creatinine returns to normal.   Please note that the total time of dictation and discharge took greater than 30 minutes.      ____________________________ Marisue Ivan, MD kl:dmm D: 08/18/2012 12:30:30 ET T: 08/18/2012 12:42:43 ET JOB#: [id number]  cc: Marisue Ivan, MD, <Dictator> Dr. Tommie Raymond MD ELECTRONICALLY SIGNED 08/24/2012 16:50

## 2014-10-11 ENCOUNTER — Other Ambulatory Visit: Payer: Self-pay | Admitting: Psychiatry

## 2014-10-11 DIAGNOSIS — Z1231 Encounter for screening mammogram for malignant neoplasm of breast: Secondary | ICD-10-CM

## 2014-10-18 ENCOUNTER — Ambulatory Visit
Admission: RE | Admit: 2014-10-18 | Discharge: 2014-10-18 | Disposition: A | Discharge status: Home / Self Care | Payer: Medicare Other | Source: Ambulatory Visit | Attending: Psychiatry | Admitting: Psychiatry

## 2014-10-18 ENCOUNTER — Other Ambulatory Visit: Payer: Self-pay | Admitting: Psychiatry

## 2014-10-18 DIAGNOSIS — Z1231 Encounter for screening mammogram for malignant neoplasm of breast: Secondary | ICD-10-CM | POA: Insufficient documentation

## 2014-11-16 ENCOUNTER — Encounter: Payer: Self-pay | Admitting: Emergency Medicine

## 2014-11-16 ENCOUNTER — Emergency Department: Payer: Medicare Other

## 2014-11-16 ENCOUNTER — Observation Stay
Admission: EM | Admit: 2014-11-16 | Discharge: 2014-11-16 | Disposition: A | Discharge status: Home / Self Care | Payer: Medicare Other | Attending: Internal Medicine | Admitting: Internal Medicine

## 2014-11-16 DIAGNOSIS — E785 Hyperlipidemia, unspecified: Secondary | ICD-10-CM | POA: Insufficient documentation

## 2014-11-16 DIAGNOSIS — Z955 Presence of coronary angioplasty implant and graft: Secondary | ICD-10-CM | POA: Diagnosis not present

## 2014-11-16 DIAGNOSIS — I252 Old myocardial infarction: Secondary | ICD-10-CM | POA: Diagnosis not present

## 2014-11-16 DIAGNOSIS — Z888 Allergy status to other drugs, medicaments and biological substances status: Secondary | ICD-10-CM | POA: Diagnosis not present

## 2014-11-16 DIAGNOSIS — M2580 Other specified joint disorders, unspecified joint: Secondary | ICD-10-CM | POA: Insufficient documentation

## 2014-11-16 DIAGNOSIS — Z803 Family history of malignant neoplasm of breast: Secondary | ICD-10-CM | POA: Diagnosis not present

## 2014-11-16 DIAGNOSIS — R079 Chest pain, unspecified: Secondary | ICD-10-CM | POA: Diagnosis present

## 2014-11-16 DIAGNOSIS — I10 Essential (primary) hypertension: Secondary | ICD-10-CM | POA: Insufficient documentation

## 2014-11-16 DIAGNOSIS — R0789 Other chest pain: Principal | ICD-10-CM | POA: Insufficient documentation

## 2014-11-16 DIAGNOSIS — Z7982 Long term (current) use of aspirin: Secondary | ICD-10-CM | POA: Insufficient documentation

## 2014-11-16 DIAGNOSIS — Z79899 Other long term (current) drug therapy: Secondary | ICD-10-CM | POA: Insufficient documentation

## 2014-11-16 DIAGNOSIS — I2510 Atherosclerotic heart disease of native coronary artery without angina pectoris: Secondary | ICD-10-CM | POA: Insufficient documentation

## 2014-11-16 DIAGNOSIS — E780 Pure hypercholesterolemia: Secondary | ICD-10-CM | POA: Diagnosis not present

## 2014-11-16 DIAGNOSIS — R918 Other nonspecific abnormal finding of lung field: Secondary | ICD-10-CM | POA: Diagnosis not present

## 2014-11-16 LAB — BASIC METABOLIC PANEL
ANION GAP: 11 (ref 5–15)
BUN: 20 mg/dL (ref 6–20)
CO2: 25 mmol/L (ref 22–32)
CREATININE: 1.05 mg/dL — AB (ref 0.44–1.00)
Calcium: 9.9 mg/dL (ref 8.9–10.3)
Chloride: 98 mmol/L — ABNORMAL LOW (ref 101–111)
GFR calc non Af Amer: 48 mL/min — ABNORMAL LOW (ref >60)
GFR, EST AFRICAN AMERICAN: 56 mL/min — AB (ref >60)
GLUCOSE: 163 mg/dL — AB (ref 65–99)
POTASSIUM: 4.2 mmol/L (ref 3.5–5.1)
SODIUM: 134 mmol/L — AB (ref 135–145)

## 2014-11-16 LAB — CBC
HCT: 37.3 % (ref 35.0–47.0)
Hemoglobin: 12.6 g/dL (ref 12.0–16.0)
MCH: 32.4 pg (ref 26.0–34.0)
MCHC: 33.8 g/dL (ref 32.0–36.0)
MCV: 95.9 fL (ref 80.0–100.0)
PLATELETS: 262 10*3/uL (ref 150–440)
RBC: 3.89 MIL/uL (ref 3.80–5.20)
RDW: 13.5 % (ref 11.5–14.5)
WBC: 8.1 10*3/uL (ref 3.6–11.0)

## 2014-11-16 LAB — TROPONIN I
Troponin I: <0.03 ng/mL (ref <0.031)
Troponin I: <0.03 ng/mL (ref <0.031)

## 2014-11-16 LAB — FIBRIN DERIVATIVES D-DIMER (ARMC ONLY): Fibrin derivatives D-dimer (ARMC): 704 — ABNORMAL HIGH (ref 0–499)

## 2014-11-16 MED ORDER — SPIRONOLACTONE 25 MG PO TABS
25.0000 mg | ORAL_TABLET | Freq: Every day | ORAL | Status: DC
  Administered 2014-11-16: 25 mg via ORAL
  Filled 2014-11-16: qty 1

## 2014-11-16 MED ORDER — NITROGLYCERIN 0.4 MG SL SUBL: 0.4000 mg | SUBLINGUAL_TABLET | SUBLINGUAL | Status: DC | PRN

## 2014-11-16 MED ORDER — CARVEDILOL 25 MG PO TABS: 25.0000 mg | ORAL_TABLET | Freq: Every day | ORAL | Status: DC

## 2014-11-16 MED ORDER — ISOSORBIDE MONONITRATE ER 60 MG PO TB24
60.0000 mg | ORAL_TABLET | Freq: Every day | ORAL | Status: DC
Start: 2014-11-16 — End: 2014-11-16
  Administered 2014-11-16: 60 mg via ORAL
  Filled 2014-11-16: qty 1

## 2014-11-16 MED ORDER — ACETAMINOPHEN 325 MG PO TABS
650.0000 mg | ORAL_TABLET | Freq: Four times a day (QID) | ORAL | Status: DC | PRN
  Administered 2014-11-16: 650 mg via ORAL
  Filled 2014-11-16: qty 2

## 2014-11-16 MED ORDER — SENNOSIDES-DOCUSATE SODIUM 8.6-50 MG PO TABS
1.0000 | ORAL_TABLET | Freq: Two times a day (BID) | ORAL | Status: DC
  Administered 2014-11-16: 1 via ORAL
  Filled 2014-11-16: qty 1

## 2014-11-16 MED ORDER — HEPARIN SODIUM (PORCINE) 5000 UNIT/ML IJ SOLN
5000.0000 [IU] | Freq: Three times a day (TID) | INTRAMUSCULAR | Status: DC
  Administered 2014-11-16: 5000 [IU] via SUBCUTANEOUS
  Filled 2014-11-16: qty 1

## 2014-11-16 MED ORDER — TORSEMIDE 5 MG PO TABS: 10.0000 mg | ORAL_TABLET | Freq: Every evening | ORAL | Status: DC

## 2014-11-16 MED ORDER — ASPIRIN EC 81 MG PO TBEC
81.0000 mg | DELAYED_RELEASE_TABLET | Freq: Every day | ORAL | Status: DC
  Administered 2014-11-16: 81 mg via ORAL

## 2014-11-16 MED ORDER — ASPIRIN EC 81 MG PO TBEC
81.0000 mg | DELAYED_RELEASE_TABLET | Freq: Every day | ORAL | Status: DC
  Filled 2014-11-16: qty 1

## 2014-11-16 MED ORDER — ALPRAZOLAM 0.25 MG PO TABS: 0.1250 mg | ORAL_TABLET | Freq: Two times a day (BID) | ORAL | Status: DC | PRN

## 2014-11-16 MED ORDER — ACETAMINOPHEN 325 MG PO TABS
650.0000 mg | ORAL_TABLET | ORAL | Status: AC
  Administered 2014-11-16: 650 mg via ORAL
  Filled 2014-11-16: qty 2

## 2014-11-16 MED ORDER — MORPHINE SULFATE 2 MG/ML IJ SOLN: 2.0000 mg | INTRAMUSCULAR | Status: DC | PRN

## 2014-11-16 MED ORDER — CLOPIDOGREL BISULFATE 75 MG PO TABS
75.0000 mg | ORAL_TABLET | Freq: Every day | ORAL | Status: DC
  Administered 2014-11-16: 75 mg via ORAL
  Filled 2014-11-16: qty 1

## 2014-11-16 MED ORDER — CLONIDINE HCL 0.2 MG/24HR TD PTWK
0.2000 mg | MEDICATED_PATCH | TRANSDERMAL | Status: DC
  Filled 2014-11-16: qty 1

## 2014-11-16 MED ORDER — VITAMIN D 1000 UNITS PO TABS
1000.0000 [IU] | ORAL_TABLET | Freq: Every day | ORAL | Status: DC
  Administered 2014-11-16: 1000 [IU] via ORAL
  Filled 2014-11-16: qty 1

## 2014-11-16 MED ORDER — AMLODIPINE BESYLATE 5 MG PO TABS
5.0000 mg | ORAL_TABLET | Freq: Two times a day (BID) | ORAL | Status: DC
  Administered 2014-11-16: 5 mg via ORAL
  Filled 2014-11-16: qty 1

## 2014-11-16 MED ORDER — ONDANSETRON HCL 4 MG/2ML IJ SOLN: 4.0000 mg | Freq: Four times a day (QID) | INTRAMUSCULAR | Status: DC | PRN

## 2014-11-16 MED ORDER — ONDANSETRON HCL 4 MG PO TABS
4.0000 mg | ORAL_TABLET | Freq: Four times a day (QID) | ORAL | Status: DC | PRN
Start: 2014-11-16 — End: 2014-11-16

## 2014-11-16 MED ORDER — TIMOLOL HEMIHYDRATE 0.5 % OP SOLN
1.0000 [drp] | Freq: Every evening | OPHTHALMIC | Status: DC
  Filled 2014-11-16: qty 5

## 2014-11-16 MED ORDER — ASPIRIN 81 MG PO CHEW
324.0000 mg | CHEWABLE_TABLET | Freq: Once | ORAL | Status: AC
Start: 2014-11-16 — End: 2014-11-16
  Administered 2014-11-16: 324 mg via ORAL
  Filled 2014-11-16: qty 4

## 2014-11-16 MED ORDER — ROSUVASTATIN CALCIUM 20 MG PO TABS
20.0000 mg | ORAL_TABLET | Freq: Every day | ORAL | Status: DC
  Administered 2014-11-16: 20 mg via ORAL
  Filled 2014-11-16: qty 1

## 2014-11-16 MED ORDER — PANTOPRAZOLE SODIUM 40 MG PO TBEC
40.0000 mg | DELAYED_RELEASE_TABLET | Freq: Every day | ORAL | Status: DC
  Administered 2014-11-16: 40 mg via ORAL
  Filled 2014-11-16: qty 1

## 2014-11-16 MED ORDER — ACETAMINOPHEN 650 MG RE SUPP: 650.0000 mg | Freq: Four times a day (QID) | RECTAL | Status: DC | PRN

## 2014-11-16 MED ORDER — METHYLPREDNISOLONE SODIUM SUCC 40 MG IJ SOLR
40.0000 mg | Freq: Once | INTRAMUSCULAR | Status: AC
  Administered 2014-11-16: 40 mg via INTRAVENOUS
  Filled 2014-11-16: qty 1

## 2014-11-16 NOTE — Progress Notes (Signed)
Patient ambulated around nurses station twice. No distress noted. No c/o chest pain. Gait steady, no sob. Patient returned to room. Waiting on 3rd troponin to update dr. Hyacinth Sloan. Will continue to monitor [employer]

## 2014-11-16 NOTE — H&P (Signed)
[facility] [place] at [facility]   PATIENT NAME: [name]    MR#:  [id number]  DATE OF BIRTH:  [birth year]   DATE OF ADMISSION:  11/16/2014  PRIMARY CARE PHYSICIAN: Danella Penton., MD   REQUESTING/REFERRING PHYSICIAN: Quale  CHIEF COMPLAINT:   Chief Complaint  Patient presents with  . Chest Pain    HISTORY OF PRESENT ILLNESS:  [name]  is a 81 y.o. female with a known history of coronary artery disease presenting with chest pain. Describes three-day duration intermittent chest pain left chest with some radiation to the back. It is not related with exertion or further symptomatology. She did take a nitroglycerin without any relief. She was actually evaluated by her PCP for the above symptoms had an EKG which was within normal limits. Urine increasing intensity of symptoms is at present to the Hospital further workup and evaluation. She describes intensity 7/10, quality and dull ache, no known worsening or relieving factors  PAST MEDICAL HISTORY:   Past Medical History  Diagnosis Date  . S/P angioplasty with stent   . Myocardial infarction 2008  . Hypertension   . Hypercholesteremia     PAST SURGICAL HISTORY:   Past Surgical History  Procedure Laterality Date  . Coronary angioplasty with stent placement    . Abdominal hysterectomy    . Appendectomy      SOCIAL HISTORY:   History  Substance Use Topics  . Smoking status: Never Smoker   . Smokeless tobacco: Not on file  . Alcohol Use: No    FAMILY HISTORY:   Family History  Problem Relation Age of Onset  . Breast cancer Maternal Aunt     DRUG ALLERGIES:   Allergies  Allergen Reactions  . Terbinafine And Related Other (See Comments)    Tongue swelled and turned red    REVIEW OF SYSTEMS:  REVIEW OF SYSTEMS:  CONSTITUTIONAL: Denies fevers, chills, fatigue, weakness.  EYES: Denies blurred vision, double vision, or eye pain.  EARS, NOSE, THROAT: Denies tinnitus, ear  pain, hearing loss.  RESPIRATORY: denies cough, shortness of breath, wheezing  CARDIOVASCULAR: Positive chest pain denies, palpitations, edema.  GASTROINTESTINAL: Denies nausea, vomiting, diarrhea, abdominal pain.  GENITOURINARY: Denies dysuria, hematuria.  ENDOCRINE: Denies nocturia or thyroid problems. HEMATOLOGIC AND LYMPHATIC: Denies easy bruising or bleeding.  SKIN: Denies rash or lesions.  MUSCULOSKELETAL: Denies pain in neck, back, shoulder, knees, hips, or further arthritic symptoms.  NEUROLOGIC: Denies paralysis, paresthesias.  PSYCHIATRIC: Denies anxiety or depressive symptoms. Otherwise full review of systems performed by me is negative.   MEDICATIONS AT HOME:   Prior to Admission medications   Medication Sig Start Date End Date Taking? Authorizing Provider  acetaminophen (TYLENOL) 500 MG tablet Take 1,000 mg by mouth every 6 (six) hours as needed. 1-2 tabs daily as needed For pain   Yes Historical Provider, MD  ALPRAZolam (XANAX) 0.25 MG tablet Take 0.125 mg by mouth 2 (two) times daily as needed for anxiety or sleep.    Yes Historical Provider, MD  AMLODIPINE BESYLATE PO Take 5 mg by mouth 2 (two) times daily.   Yes Historical Provider, MD  aspirin EC 81 MG tablet Take 81 mg by mouth daily.   Yes Historical Provider, MD  carvedilol (COREG) 25 MG tablet Take 25 mg by mouth at bedtime.   Yes Historical Provider, MD  cholecalciferol (VITAMIN D) 1000 UNITS tablet Take 1,000 Units by mouth daily.   Yes Historical Provider, MD  cloNIDine (CATAPRES - DOSED  IN MG/24 HR) 0.2 mg/24hr patch Place 0.2 mg onto the skin once a week.   Yes Historical Provider, MD  clopidogrel (PLAVIX) 75 MG tablet Take 75 mg by mouth daily.   Yes Historical Provider, MD  ibuprofen (ADVIL,MOTRIN) 200 MG tablet Take 400 mg by mouth 2 (two) times daily. For pain   Yes Historical Provider, MD  isosorbide mononitrate (IMDUR) 60 MG 24 hr tablet Take 60 mg by mouth daily.   Yes Historical Provider, MD   lansoprazole (PREVACID) 30 MG capsule Take 30 mg by mouth daily.   Yes Historical Provider, MD  Magnesium 400 MG TABS Take 400 mg by mouth every morning.   Yes Historical Provider, MD  Multiple Vitamin (MULTIVITAMIN WITH MINERALS) TABS tablet Take 1 tablet by mouth daily.   Yes Historical Provider, MD  nitroGLYCERIN (NITROSTAT) 0.4 MG SL tablet Place 0.4 mg under the tongue every 5 (five) minutes as needed. For chest pain   Yes Historical Provider, MD  rosuvastatin (CRESTOR) 20 MG tablet Take 20 mg by mouth daily.   Yes Historical Provider, MD  senna-docusate (SENOKOT-S) 8.6-50 MG per tablet Take 1 tablet by mouth 2 (two) times daily.   Yes Historical Provider, MD  spironolactone (ALDACTONE) 25 MG tablet Take 25 mg by mouth daily.   Yes Historical Provider, MD  timolol (BETIMOL) 0.5 % ophthalmic solution Place 1 drop into both eyes every evening.   Yes Historical Provider, MD  torsemide (DEMADEX) 10 MG tablet Take 10 mg by mouth every evening.    Yes Historical Provider, MD      VITAL SIGNS:  Blood pressure 174/79, pulse 74, temperature 97.5 F (36.4 C), temperature source Oral, resp. rate 18, height 5\' 3"  (1.6 m), weight 149 lb (67.586 kg), SpO2 100 %.  PHYSICAL EXAMINATION:  VITAL SIGNS: Filed Vitals:   11/16/14 0320  BP: 174/79  Pulse: 74  Temp:   Resp: 18   GENERAL:81 y.o.female currently in no acute distress.  HEAD: Normocephalic, atraumatic.  EYES: Pupils equal, round, reactive to light. Extraocular muscles intact. No scleral icterus.  MOUTH: Moist mucosal membrane. Dentition intact. No abscess noted.  EAR, NOSE, THROAT: Clear without exudates. No external lesions.  NECK: Supple. No thyromegaly. No nodules. No JVD.  PULMONARY: Clear to ascultation, without wheeze rails or rhonci. No use of accessory muscles, Good respiratory effort. good air entry bilaterally CHEST: Nontender to palpation.  CARDIOVASCULAR: S1 and S2. Regular rate and rhythm. No murmurs, rubs, or gallops. No  edema. Pedal pulses 2+ bilaterally.  GASTROINTESTINAL: Soft, nontender, nondistended. No masses. Positive bowel sounds. No hepatosplenomegaly.  MUSCULOSKELETAL: No swelling, clubbing, or edema. Range of motion full in all extremities.  NEUROLOGIC: Cranial nerves II through XII are intact. No gross focal neurological deficits. Sensation intact. Reflexes intact.  SKIN: No ulceration, lesions, rashes, or cyanosis. Skin warm and dry. Turgor intact.  PSYCHIATRIC: Mood, affect within normal limits. The patient is awake, alert and oriented x 3. Insight, judgment intact.    LABORATORY PANEL:   CBC  Recent Labs Lab 11/16/14 0119  WBC 8.1  HGB 12.6  HCT 37.3  PLT 262   ------------------------------------------------------------------------------------------------------------------  Chemistries   Recent Labs Lab 11/16/14 0119  NA 134*  K 4.2  CL 98*  CO2 25  GLUCOSE 163*  BUN 20  CREATININE 1.05*  CALCIUM 9.9   ------------------------------------------------------------------------------------------------------------------  Cardiac Enzymes  Recent Labs Lab 11/16/14 0119  TROPONINI <0.03   ------------------------------------------------------------------------------------------------------------------  RADIOLOGY:  Dg Chest 2 View  11/16/2014   CLINICAL  DATA:  Intermittent chest pain for 4 days, worse tonight.  EXAM: CHEST  2 VIEW  COMPARISON:  08/15/2012  FINDINGS: The cardiomediastinal contours are unchanged with stable mild cardiomegaly. Minimal blunting of right costophrenic angle, likely small effusion. Prominent interstitial markings, slightly diminished from prior. Pulmonary vasculature is normal. No consolidation or pneumothorax. No acute osseous abnormalities are seen.  IMPRESSION: 1. Unchanged cardiomegaly. Prominent interstitial markings, appear chronic. 2. Small right pleural effusion.   Electronically Signed   By: Rubye Oaks M.D.   On: 11/16/2014 02:18     EKG:   Orders placed or performed during the hospital encounter of 11/16/14  . ED EKG within 10 minutes  . ED EKG within 10 minutes    IMPRESSION AND PLAN:   81 year old Caucasian female history coronary artery disease presenting with chest pain  1. Left-sided chest pain: Place on telemetry trend cardiac enzymes initiate aspirin statin therapy as needed morphine and nitroglycerin 2. Essential hypertension Coreg, amlodipine clonidine 3. Hyperlipidemia unspecified Crestor 4. Venous thromboembolism prophylactic: Heparin subcutaneous     All the records are reviewed and case discussed with ED provider. Management plans discussed with the patient, family and they are in agreement.  CODE STATUS: Full  TOTAL TIME TAKING CARE OF THIS PATIENT: 35 minutes.    Hower,  Mardi Mainland.D on 11/16/2014 at 3:59 AM  Between 7am to 6pm - Pager - [phone]  After 6pm: House Pager: - [phone]  [facility]  Office  [phone]  CC: Primary care physician; Danella Penton., MD

## 2014-11-16 NOTE — ED Provider Notes (Signed)
[facility] Emergency Department Provider Note  ____________________________________________  Time seen: Approximately 1:40 AM  I have reviewed the triage vital signs and the nursing notes.   HISTORY  Chief Complaint Chest Pain    HPI [name] is a 81 y.o. female who is been experiencing some chest discomfort for the last few days. She reports that she been having off and on chest pain which has been fairly mild in the left chest, radiates around to the left back. Occasionally associated with some mild shortness of breath. Not worse with deep inspiration. Some nausea but no vomiting. She does report a previous history of a heart attack where she had chest pressure that felt like a brick on her chest, today it feels slightly tight but does not feel like a similar heavy pressure.  She saw Dr. Hyacinth Meeker and was told her EKG was normal this week. Tonight she took a nitroglycerin at home for chest discomfort and became very lightheaded, and does not murmurs itself relieved her pain.  No recent hospitalizations, trauma, leg swelling. Does not take any estrogens. No history of previous blood clots. No cough.   Past Medical History  Diagnosis Date  . S/P angioplasty with stent   . Myocardial infarction 2008  . Hypertension   . Hypercholesteremia     There are no active problems to display for this patient.   Past Surgical History  Procedure Laterality Date  . Coronary angioplasty with stent placement    . Abdominal hysterectomy    . Appendectomy      Current Outpatient Rx  Name  Route  Sig  Dispense  Refill  . acetaminophen (TYLENOL) 500 MG tablet   Oral   Take 1,000 mg by mouth every 6 (six) hours as needed. 1-2 tabs daily as needed For pain         . ALPRAZolam (XANAX) 0.25 MG tablet   Oral   Take 0.125 mg by mouth 2 (two) times daily as needed for anxiety or sleep.          Marland Kitchen AMLODIPINE BESYLATE PO   Oral   Take 5 mg by mouth 2 (two) times  daily.         Marland Kitchen aspirin EC 81 MG tablet   Oral   Take 81 mg by mouth daily.         . carvedilol (COREG) 25 MG tablet   Oral   Take 25 mg by mouth at bedtime.         . cholecalciferol (VITAMIN D) 1000 UNITS tablet   Oral   Take 1,000 Units by mouth daily.         . cloNIDine (CATAPRES - DOSED IN MG/24 HR) 0.2 mg/24hr patch   Transdermal   Place 0.2 mg onto the skin once a week.         . clopidogrel (PLAVIX) 75 MG tablet   Oral   Take 75 mg by mouth daily.         Marland Kitchen ibuprofen (ADVIL,MOTRIN) 200 MG tablet   Oral   Take 400 mg by mouth 2 (two) times daily. For pain         . isosorbide mononitrate (IMDUR) 60 MG 24 hr tablet   Oral   Take 60 mg by mouth daily.         . lansoprazole (PREVACID) 30 MG capsule   Oral   Take 30 mg by mouth daily.         Marland Kitchen  Magnesium 400 MG TABS   Oral   Take 400 mg by mouth every morning.         . Multiple Vitamin (MULTIVITAMIN WITH MINERALS) TABS tablet   Oral   Take 1 tablet by mouth daily.         . nitroGLYCERIN (NITROSTAT) 0.4 MG SL tablet   Sublingual   Place 0.4 mg under the tongue every 5 (five) minutes as needed. For chest pain         . rosuvastatin (CRESTOR) 20 MG tablet   Oral   Take 20 mg by mouth daily.         Marland Kitchen senna-docusate (SENOKOT-S) 8.6-50 MG per tablet   Oral   Take 1 tablet by mouth 2 (two) times daily.         Marland Kitchen spironolactone (ALDACTONE) 25 MG tablet   Oral   Take 25 mg by mouth daily.         . timolol (BETIMOL) 0.5 % ophthalmic solution   Both Eyes   Place 1 drop into both eyes every evening.         . torsemide (DEMADEX) 10 MG tablet   Oral   Take 10 mg by mouth every evening.            Allergies Terbinafine and related  Family History  Problem Relation Age of Onset  . Breast cancer Maternal Aunt     Social History History  Substance Use Topics  . Smoking status: Never Smoker   . Smokeless tobacco: Not on file  . Alcohol Use: No    Review of  Systems Constitutional: No fever/chills Eyes: No visual changes. ENT: No sore throat. Cardiovascular: See history of present illness  Respiratory: See history of present illness  Gastrointestinal: No abdominal pain.   no vomiting.  No diarrhea.  No constipation. Genitourinary: Negative for dysuria. Musculoskeletal: Negative for back pain. Skin: Negative for rash. Neurological: Negative for headaches, focal weakness or numbness.  10-point ROS otherwise negative.  ____________________________________________   PHYSICAL EXAM:  VITAL SIGNS: ED Triage Vitals  Enc Vitals Group     BP 11/16/14 0112 178/56 mmHg     Pulse Rate 11/16/14 0112 63     Resp 11/16/14 0112 18     Temp 11/16/14 0112 97.5 F (36.4 C)     Temp Source 11/16/14 0112 Oral     SpO2 11/16/14 0112 100 %     Weight 11/16/14 0112 149 lb (67.586 kg)     Height 11/16/14 0112 5\' 3"  (1.6 m)     Head Cir --      Peak Flow --      Pain Score --      Pain Loc --      Pain Edu? --      Excl. in GC? --     Constitutional: Alert and oriented. Well appearing and in no acute distress. Eyes: Conjunctivae are normal. PERRL. EOMI. Head: Atraumatic. Nose: No congestion/rhinnorhea. Mouth/Throat: Mucous membranes are moist.  Oropharynx non-erythematous. Neck: No stridor.   Cardiovascular: Normal rate, regular rhythm. Grossly normal heart sounds.  Good peripheral circulation. Respiratory: Normal respiratory effort.  No retractions. Lungs CTAB. Gastrointestinal: Soft and nontender. No distention. No abdominal bruits. No CVA tenderness. Musculoskeletal: No lower extremity tenderness nor edema.  No joint effusions. Neurologic:  Normal speech and language. No gross focal neurologic deficits are appreciated. No gait instability. Skin:  Skin is warm, dry and intact. No rash noted. Psychiatric: Mood and affect  are normal. Speech and behavior are normal.  Patient reports still having some mild to moderate left-sided chest discomfort  which is a slight tightness and occasionally sharp feeling. There is no rash on the chest, no evidence of anything such as shingles. No reproducible chest discomfort to palpation. ____________________________________________   LABS (all labs ordered are listed, but only abnormal results are displayed)  Labs Reviewed  BASIC METABOLIC PANEL - Abnormal; Notable for the following:    Sodium 134 (*)    Chloride 98 (*)    Glucose, Bld 163 (*)    Creatinine, Ser 1.05 (*)    GFR calc non Af Amer 48 (*)    GFR calc Af Amer 56 (*)    All other components within normal limits  CBC  TROPONIN I  FIBRIN DERIVATIVES D-DIMER ([facility] ONLY)   ____________________________________________  EKG  ED ECG REPORT I, QUALE, MARK, the attending physician, personally viewed and interpreted this ECG.  Date: 11/16/2014 EKG Time: 1:15 Rate: 60 Rhythm: normal sinus rhythm QRS Axis: normal Intervals:  first-degree AV block ST/T Wave abnormalities: normal Conduction Disutrbances: none Narrative Interpretation: First-degree AV block no ischemic changes ____________________________________________  RADIOLOGY  DG Chest 2 View (Final result) Result time: 11/16/14 02:18:54   Final result by Rad Results In Interface (11/16/14 02:18:54)   Narrative:   CLINICAL DATA: Intermittent chest pain for 4 days, worse tonight.  EXAM: CHEST 2 VIEW  COMPARISON: 08/15/2012  FINDINGS: The cardiomediastinal contours are unchanged with stable mild cardiomegaly. Minimal blunting of right costophrenic angle, likely small effusion. Prominent interstitial markings, slightly diminished from prior. Pulmonary vasculature is normal. No consolidation or pneumothorax. No acute osseous abnormalities are seen.  IMPRESSION: 1. Unchanged cardiomegaly. Prominent interstitial markings, appear chronic. 2. Small right pleural effusion.     ____________________________________________   PROCEDURES  Procedure(s)  performed: None  Critical Care performed: No  ____________________________________________   INITIAL IMPRESSION / ASSESSMENT AND PLAN / ED COURSE  Pertinent labs & imaging results that were available during my care of the patient were reviewed by me and considered in my medical decision making (see chart for details).  DG Chest 2 View (Final result) Result time: 11/16/14 02:18:54   Final result by Rad Results In Interface (11/16/14 02:18:54)   Narrative:   CLINICAL DATA: Intermittent chest pain for 4 days, worse tonight.  EXAM: CHEST 2 VIEW  COMPARISON: 08/15/2012  FINDINGS: The cardiomediastinal contours are unchanged with stable mild cardiomegaly. Minimal blunting of right costophrenic angle, likely small effusion. Prominent interstitial markings, slightly diminished from prior. Pulmonary vasculature is normal. No consolidation or pneumothorax. No acute osseous abnormalities are seen.  IMPRESSION: 1. Unchanged cardiomegaly. Prominent interstitial markings, appear chronic. 2. Small right pleural effusion.    Patient presents with chest pain intermittent for the last 4 days but does have a history of cardiac disease. Presently no evidence of acute ischemia based on twelve-lead and troponin, other differential diagnosis would include less likely pneumothorax, pulmonary embolism for which we'll screen with d-dimer for low risk, pleurisy, etc. No symptoms of acute dissection, again screen with d-dimer. No abdominal pain.  Based on the patient's history of cardiac disease, and age as well as left-sided chest tightness I have recommended admission for chest pain observation and further workup. The patient is agreeable with this plan of care and I have discussed with Dr. Angelica Ran. D-dimer is pending at the time of admission.  ____________________________________________   FINAL CLINICAL IMPRESSION(S) / ED DIAGNOSES  Final diagnoses:  Chest pain with  moderate risk for  cardiac etiology      Sharyn Creamer, MD 11/16/14 [phone]

## 2014-11-16 NOTE — ED Notes (Signed)
Patient transported to X-ray 

## 2014-11-16 NOTE — ED Notes (Signed)
Assisted patient up to bathroom and back into bed.

## 2014-11-16 NOTE — ED Notes (Signed)
Patient back from x-ray 

## 2014-11-16 NOTE — ED Notes (Signed)
Assisted patient up to bathroom and back in bed.

## 2014-11-16 NOTE — Progress Notes (Signed)
[name] is a 81 y.o. female   SUBJECTIVE:  Admitted with left thoracic radiating pain to ant chest,nonexertional, troponin neg, h/o cad. BP stable  ______________________________________________________________________  ROS: Review of systems is unremarkable for any active cardiac,respiratory, GI, GU, hematologic, neurologic or psychiatric systems, 10 systems reviewed.  Marland Kitchen amLODipine  5 mg Oral BID  . aspirin EC  81 mg Oral Daily  . aspirin EC  81 mg Oral Daily  . carvedilol  25 mg Oral QHS  . cholecalciferol  1,000 Units Oral Daily  . cloNIDine  0.2 mg Transdermal Weekly  . clopidogrel  75 mg Oral Daily  . heparin  5,000 Units Subcutaneous 3 times per day  . isosorbide mononitrate  60 mg Oral Daily  . methylPREDNISolone (SOLU-MEDROL) injection  40 mg Intravenous Once  . pantoprazole  40 mg Oral Daily  . rosuvastatin  20 mg Oral Daily  . senna-docusate  1 tablet Oral BID  . spironolactone  25 mg Oral Daily  . timolol  1 drop Both Eyes QPM  . torsemide  10 mg Oral QPM   acetaminophen **OR** acetaminophen, ALPRAZolam, morphine injection, ondansetron **OR** ondansetron (ZOFRAN) IV   Past Medical History  Diagnosis Date  . S/P angioplasty with stent   . Myocardial infarction 2008  . Hypertension   . Hypercholesteremia     Past Surgical History  Procedure Laterality Date  . Coronary angioplasty with stent placement    . Abdominal hysterectomy    . Appendectomy      PHYSICAL EXAM:  BP 174/53 mmHg  Pulse 75  Temp(Src) 97.9 F (36.6 C) (Oral)  Resp 20  Ht 5\' 3"  (1.6 m)  Wt 65 kg (143 lb 4.8 oz)  BMI 25.39 kg/m2  SpO2 100%  Wt Readings from Last 3 Encounters:  11/16/14 65 kg (143 lb 4.8 oz)           BP Readings from Last 3 Encounters:  11/16/14 174/53  12/08/11 163/58    Constitutional: NAD Neck: supple, no thyromegaly Respiratory: CTA, no rales or wheezes Cardiovascular: RRR, no murmur, no gallop Abdomen: soft, good BS, nontender Extremities: no  edema Neuro: alert and oriented, no focal motor or sensory deficits  ASSESSMENT/PLAN:  Labs and imaging studies were reviewed  Atypical cp- likely MS, thoracic, solumedrol.with cad plan stress myoview HTN- complicated regimen, stable

## 2014-11-16 NOTE — Discharge Summary (Signed)
                                                                                    [name], is a 81 y.o. female  DOB 04-04-1931  MRN [id number].  Admission date:  11/16/2014  Admitting Physician  Wyatt Haste, MD  Discharge Date:  11/16/2014    Admission Diagnosis  Chest pain with moderate risk for cardiac etiology [R07.9]  Discharge Diagnoses   Atypical chest pain Thoracic nerve impingement HTN CAD   Past Medical History  Diagnosis Date  . S/P angioplasty with stent   . Myocardial infarction 2008  . Hypertension   . Hypercholesteremia     Past Surgical History  Procedure Laterality Date  . Coronary angioplasty with stent placement    . Abdominal hysterectomy    . Appendectomy         History of present illness and  Hospital Course:     Kindly see H&P for history of present illness and admission details, please review complete Labs, Consult reports and Test reports for all details in brief  HPI  from the history and physical done on the day of admission    Hospital Course    Pt's CP resolved in the ER with troponin neg times 3 and sxs c/w thoracic nerve impingement on left. Pt ambulated with no chest pain. Likelihood of ACS very low.   Discharge Condition: stable    Follow UP  Dr Hyacinth Meeker as scheduled    Discharge Instructions  and  Discharge Medications  Xanax 0.25mg  bid prn Norvasc 5mg  bid Asa 81mg  qd Coreg 25mg  qhs Clonidine TTS-2 patch weekly Plavix 75mg  qd Imdur 60mg  qd Prevacid 30mg  qd Magnesium 400mg  qd Crestor 20mg  qhs Spironolactone qhs Torsemide 10mg  qd    Today   Subjective:   [place] Lasyone today is asx and ready to go home.  Objective:   Blood pressure 144/52, pulse 60, temperature 98.2 F (36.8 C), temperature source Oral, resp. rate 17, height 5\' 3"  (1.6 m), weight 65 kg (143 lb 4.8 oz), SpO2 98 %.   Total Time in preparing paper work, data evaluation and todays exam - 35 minutes  Jaiyanna Safran F. M.D on  11/16/2014 at 5:35 PM

## 2014-11-16 NOTE — ED Notes (Addendum)
Patient present to ED via [facility] from home with intermittent chest pain since Monday, was seen by Dr, Hyacinth Meeker (PCP) on Tuesday and patient reports Dr.Miller said "it was not my heart" Patient states pain became worse tonight. Has history of heart attack, denies similar symptoms. Patient denies chest pain at this time, took 1 Nitro at home with relief. Complaining of nausea. Patient alert and oriented x 4, respirations even and unlabored.

## 2014-11-16 NOTE — Care Management Utilization Note (Signed)
Patient admitted under observation.  Presented and explained observation notice.  Obtained signature.  Original placed in medical record and provided patient/family copy  

## 2014-11-16 NOTE — Progress Notes (Signed)
Per dr. Hyacinth Meeker cancel order for stress test. Have patient up and ambulating, will wait for third troponin. If troponin returns negative and patient is able to ambulate will discharge home this evening [name] [facility]

## 2014-11-16 NOTE — Progress Notes (Signed)
Discharge instructions along with home medication list and follow up gone over with patient. Patient verbalized that she understood instructions. No rx given to patient. No distress noted, no c/o pain. Will remove iv and telemetry once ride is available. Will continue to monitor [employer]

## 2015-02-17 ENCOUNTER — Emergency Department
Admission: EM | Admit: 2015-02-17 | Discharge: 2015-02-17 | Disposition: A | Discharge status: Home / Self Care | Payer: Medicare Other | Attending: Emergency Medicine | Admitting: Emergency Medicine

## 2015-02-17 ENCOUNTER — Encounter: Payer: Self-pay | Admitting: Emergency Medicine

## 2015-02-17 ENCOUNTER — Emergency Department: Payer: Medicare Other

## 2015-02-17 DIAGNOSIS — M5430 Sciatica, unspecified side: Secondary | ICD-10-CM

## 2015-02-17 DIAGNOSIS — Z79899 Other long term (current) drug therapy: Secondary | ICD-10-CM | POA: Insufficient documentation

## 2015-02-17 DIAGNOSIS — Z7902 Long term (current) use of antithrombotics/antiplatelets: Secondary | ICD-10-CM | POA: Insufficient documentation

## 2015-02-17 DIAGNOSIS — M5432 Sciatica, left side: Secondary | ICD-10-CM | POA: Insufficient documentation

## 2015-02-17 DIAGNOSIS — I10 Essential (primary) hypertension: Secondary | ICD-10-CM | POA: Diagnosis not present

## 2015-02-17 DIAGNOSIS — Z7982 Long term (current) use of aspirin: Secondary | ICD-10-CM | POA: Insufficient documentation

## 2015-02-17 DIAGNOSIS — M25552 Pain in left hip: Secondary | ICD-10-CM | POA: Diagnosis present

## 2015-02-17 MED ORDER — TRAMADOL HCL 50 MG PO TABS
50.0000 mg | ORAL_TABLET | ORAL | Status: DC
  Filled 2015-02-17: qty 1

## 2015-02-17 MED ORDER — TRAMADOL HCL 50 MG PO TABS
50.0000 mg | ORAL_TABLET | Freq: Once | ORAL | Status: AC
  Administered 2015-02-17: 50 mg via ORAL
  Filled 2015-02-17: qty 1

## 2015-02-17 MED ORDER — TRAMADOL HCL 50 MG PO TABS: 50.0000 mg | ORAL_TABLET | ORAL | Status: DC

## 2015-02-17 MED ORDER — ACETAMINOPHEN 325 MG PO TABS
650.0000 mg | ORAL_TABLET | ORAL | Status: AC
  Administered 2015-02-17: 650 mg via ORAL
  Filled 2015-02-17: qty 2

## 2015-02-17 NOTE — ED Notes (Signed)
MD at bedside. 

## 2015-02-17 NOTE — ED Notes (Signed)
Patient transported to X-ray 

## 2015-02-17 NOTE — Discharge Instructions (Signed)

## 2015-02-17 NOTE — ED Notes (Signed)
Pt alert and oriented X4, active, cooperative, pt in NAD. RR even and unlabored, color WNL.  Pt informed to return if any life threatening symptoms occur.   

## 2015-02-17 NOTE — ED Provider Notes (Signed)
[facility] Emergency Department Provider Note REMINDER - THIS NOTE IS NOT A FINAL MEDICAL RECORD UNTIL IT IS SIGNED. UNTIL THEN, THE CONTENT BELOW MAY REFLECT INFORMATION FROM A DOCUMENTATION TEMPLATE, NOT THE ACTUAL PATIENT VISIT. ____________________________________________  Time seen: Approximately 8:22 AM  I have reviewed the triage vital signs and the nursing notes.   HISTORY  Chief Complaint Hip Pain    HPI [name] is a 81 y.o. female history of myocardial infarction, hypertension high cholesterol. She reports that for the last couple of weeks she's been having a sharp pain in the left buttock that shoots down into the area behind the left knee. She saw her doctor for this and was given prescription for tramadol and also had an injection done about a week ago which she states actually made the pain seemed worse. She is able to walk, she occasionally gets a feeling of tingling in the left foot, but no muscle weakness. She denies pain in her back. The pain is primarily shoots out from behind the left buttock. No associated abdominal pain, no chest pain or trouble breathing.  Has been no fall or injury.  Patient expresses a Neurontin does not seem to helping, she is also taking Tylenol with little relief. She then told not to take NSAIDs.     Past Medical History  Diagnosis Date  . S/P angioplasty with stent   . Myocardial infarction (HCC) 2008  . Hypertension   . Hypercholesteremia     Patient Active Problem List   Diagnosis Date Noted  . Chest pain 11/16/2014    Past Surgical History  Procedure Laterality Date  . Coronary angioplasty with stent placement    . Abdominal hysterectomy    . Appendectomy      Current Outpatient Rx  Name  Route  Sig  Dispense  Refill  . acetaminophen (TYLENOL) 500 MG tablet   Oral   Take 1,000 mg by mouth every 6 (six) hours as needed. 1-2 tabs daily as needed For pain         . ALPRAZolam (XANAX)  0.25 MG tablet   Oral   Take 0.125 mg by mouth 2 (two) times daily as needed for anxiety or sleep.          Marland Kitchen AMLODIPINE BESYLATE PO   Oral   Take 5 mg by mouth 2 (two) times daily.         Marland Kitchen aspirin EC 81 MG tablet   Oral   Take 81 mg by mouth daily.         . carvedilol (COREG) 25 MG tablet   Oral   Take 25 mg by mouth at bedtime.         . cholecalciferol (VITAMIN D) 1000 UNITS tablet   Oral   Take 1,000 Units by mouth daily.         . cloNIDine (CATAPRES - DOSED IN MG/24 HR) 0.2 mg/24hr patch   Transdermal   Place 0.2 mg onto the skin once a week.         . clopidogrel (PLAVIX) 75 MG tablet   Oral   Take 75 mg by mouth daily.         Marland Kitchen etodolac (LODINE) 400 MG tablet   Oral   Take 2 tablets by mouth daily.         Marland Kitchen gabapentin (NEURONTIN) 100 MG capsule   Oral   Take 4 capsules by mouth daily.         Marland Kitchen  ibuprofen (ADVIL,MOTRIN) 200 MG tablet   Oral   Take 400 mg by mouth 2 (two) times daily. For pain         . isosorbide mononitrate (IMDUR) 60 MG 24 hr tablet   Oral   Take 60 mg by mouth daily.         . lansoprazole (PREVACID) 30 MG capsule   Oral   Take 30 mg by mouth daily.         . Magnesium 400 MG TABS   Oral   Take 400 mg by mouth every morning.         . Multiple Vitamin (MULTIVITAMIN WITH MINERALS) TABS tablet   Oral   Take 1 tablet by mouth daily.         . nitroGLYCERIN (NITROSTAT) 0.4 MG SL tablet   Sublingual   Place 0.4 mg under the tongue every 5 (five) minutes as needed. For chest pain         . rosuvastatin (CRESTOR) 20 MG tablet   Oral   Take 20 mg by mouth daily.         Marland Kitchen senna-docusate (SENOKOT-S) 8.6-50 MG per tablet   Oral   Take 1 tablet by mouth 2 (two) times daily.         Marland Kitchen spironolactone (ALDACTONE) 25 MG tablet   Oral   Take 25 mg by mouth daily.         . timolol (BETIMOL) 0.5 % ophthalmic solution   Both Eyes   Place 1 drop into both eyes every evening.         .  torsemide (DEMADEX) 10 MG tablet   Oral   Take 10 mg by mouth every evening.          . traMADol (ULTRAM) 50 MG tablet   Oral   Take 1 tablet (50 mg total) by mouth stat.   30 tablet   0     Allergies Terbinafine and related  Family History  Problem Relation Age of Onset  . Breast cancer Maternal Aunt     Social History Social History  Substance Use Topics  . Smoking status: Never Smoker   . Smokeless tobacco: None  . Alcohol Use: No    Review of Systems Constitutional: No fever/chills Eyes: No visual changes. ENT: No sore throat. Cardiovascular: Denies chest pain. Respiratory: Denies shortness of breath. Gastrointestinal: No abdominal pain.  No nausea, no vomiting.  No diarrhea.  No constipation. Genitourinary: Negative for dysuria. Musculoskeletal: Negative for back pain. Skin: Negative for rash. Neurological: Negative for headaches, focal weakness or numbness.  10-point ROS otherwise negative.  ____________________________________________   PHYSICAL EXAM:  VITAL SIGNS: ED Triage Vitals  Enc Vitals Group     BP 02/17/15 0542 166/67 mmHg     Pulse Rate 02/17/15 0542 63     Resp 02/17/15 0542 18     Temp 02/17/15 0542 98.2 F (36.8 C)     Temp Source 02/17/15 0542 Oral     SpO2 --      Weight 02/17/15 0542 149 lb (67.586 kg)     Height 02/17/15 0542 5' 3.75" (1.619 m)     Head Cir --      Peak Flow --      Pain Score 02/17/15 0540 10     Pain Loc --      Pain Edu? --      Excl. in GC? --    Constitutional: Alert and oriented. Well appearing  and in no acute distress. Eyes: Conjunctivae are normal. PERRL. EOMI. Head: Atraumatic. Nose: No congestion/rhinnorhea. Mouth/Throat: Mucous membranes are moist.  Oropharynx non-erythematous. Neck: No stridor.  No cervical, thoracic or lumbar tenderness. Cardiovascular: Normal rate, regular rhythm. Grossly normal heart sounds.  Good peripheral circulation. Respiratory: Normal respiratory effort.  No  retractions. Lungs CTAB. Gastrointestinal: Soft and nontender. No distention. No abdominal bruits. No CVA tenderness. Musculoskeletal: No lower extremity tenderness nor edema.  No joint effusions. The patient does have significant pain with palpation of the left sciatic notch, which causes reproducible shooting pain in the left leg. She is neurovascularly intact with strong dorsalis pedis pulses bilaterally. She is able to walk, but reports that she has bruising and pain when she tries to lay flat or stretch the left leg. She has no neurologic deficit noted. 5 out of 5 strength in lower extremity as bilaterally. Neurologic:  Normal speech and language. No gross focal neurologic deficits are appreciated. No gait instability though slightly antalgic. Skin:  Skin is warm, dry and intact. No rash noted. Psychiatric: Mood and affect are normal. Speech and behavior are normal.  ____________________________________________   LABS (all labs ordered are listed, but only abnormal results are displayed)  Labs Reviewed - No data to display ____________________________________________  EKG   ____________________________________________  RADIOLOGY  G FEMUR MIN 2 VIEWS LEFT (Final result) Result time: 02/17/15 07:49:58   Final result by Rad Results In Interface (02/17/15 07:49:58)   Narrative:   CLINICAL DATA: LEFT posterior hip and thigh pain without injury. Pain for 1 week  EXAM: LEFT FEMUR 2 VIEWS  COMPARISON: None.  FINDINGS: No fracture dislocation of the LEFT femur. Arterial vascular calcification noted.  IMPRESSION: No acute osseous abnormality.   Electronically Signed By: Genevive Bi M.D. On: 02/17/2015 07:49          DG Lumbar Spine 2-3 Views (Final result) Result time: 02/17/15 07:51:16   Final result by Rad Results In Interface (02/17/15 07:51:16)   Narrative:   CLINICAL DATA: LEFT hip pain and buttock pain  EXAM: LUMBAR SPINE - 2-3  VIEW  COMPARISON: CT 12/14/2011  FINDINGS: Normal alignment of lumbar vertebral bodies. No loss vertebral body height and disc height. Mild endplate spurring at L2. Dense calcification of the abdominal aorta.  IMPRESSION: 1. No acute findings lumbar spine. 2. Mild disc osteophytic disease. 3. Atherosclerotic calcification of the abdominal aorta.      ____________________________________________   PROCEDURES  Procedure(s) performed: None  Critical Care performed: No  ____________________________________________   INITIAL IMPRESSION / ASSESSMENT AND PLAN / ED COURSE  Pertinent labs & imaging results that were available during my care of the patient were reviewed by me and considered in my medical decision making (see chart for details).  Percent with pain from the left buttock down. Seems most consistent with sciatica, and she has been treated with Neurontin with little relief. She has no evidence to suggest acute neurovascular abnormality. Her gait is slightly antalgic, but able to walk stably. She has no intra-abdominal complaints. Do not believe there is any associated clinical findings to suggest need for imaging be on plain films to rule out compression fracture or stress fracture of the hip. No symptoms to suggest acute cardiac, pulmonary, or vascular abnormality.  We'll treat her with tramadol which is reported as benign helpful pain medicine with no concerning side effects for her in the past. I'll refer her back to Dr. Hyacinth Meeker, for ongoing care and evaluation.  ----------------------------------------- 8:53 AM on 02/17/2015 -----------------------------------------  And given a second dose of tramadol as a patient reports the first dose only mildly dulled her pain. She is fully awake and alert in no distress.  Discharge the patient with tramadol, going home with her husband and son who are driving her home. Careful return precautions discussed including any  numbness, weakness, severe back pain, abdominal pain, fevers, or other concerns arise. She has a primary physician, Dr. Hyacinth Meeker, and she can follow up closely.  ----------------------------------------- 9:14 AM on 02/17/2015 -----------------------------------------  Patient reports that tramadol s now working, her pain is dull significantly and she feels improved. She is awake and alert in no distress. Return precautions and follow-up care advised. ____________________________________________   FINAL CLINICAL IMPRESSION(S) / ED DIAGNOSES  Final diagnoses:  Sciatic leg pain      Sharyn Creamer, MD 02/17/15 [phone]

## 2015-02-17 NOTE — ED Notes (Addendum)
Pt able to stand independently with ease. Pt states that she has prescription for gabapentin but medication is not helping with the pain.  Pt denies injury to hip. Skin around area of injection appears normal, no deformities.

## 2015-02-17 NOTE — ED Notes (Addendum)
Pt says she got an injection in her left buttock Tuesday for sciatica; has been feeling bad since Sunday; pain has worsened slowly since Wednesday; called her MD Thursday and was prescribed etodolac; called the next day and told her to discontinue that medication; put on gabapentin; gabapentin is not helping either; pt denies injury; pt says she's been using a cane or walker to assist with ambulation

## 2015-02-19 ENCOUNTER — Other Ambulatory Visit: Payer: Self-pay | Admitting: Internal Medicine

## 2015-02-19 DIAGNOSIS — M5116 Intervertebral disc disorders with radiculopathy, lumbar region: Secondary | ICD-10-CM

## 2015-02-28 ENCOUNTER — Ambulatory Visit
Admission: RE | Admit: 2015-02-28 | Discharge: 2015-02-28 | Disposition: A | Discharge status: Home / Self Care | Payer: Medicare Other | Source: Ambulatory Visit | Attending: Internal Medicine | Admitting: Internal Medicine

## 2015-02-28 DIAGNOSIS — M5127 Other intervertebral disc displacement, lumbosacral region: Secondary | ICD-10-CM | POA: Diagnosis not present

## 2015-02-28 DIAGNOSIS — M4806 Spinal stenosis, lumbar region: Secondary | ICD-10-CM | POA: Diagnosis not present

## 2015-02-28 DIAGNOSIS — M5116 Intervertebral disc disorders with radiculopathy, lumbar region: Secondary | ICD-10-CM | POA: Diagnosis present

## 2015-02-28 DIAGNOSIS — M5186 Other intervertebral disc disorders, lumbar region: Secondary | ICD-10-CM | POA: Diagnosis not present

## 2015-02-28 DIAGNOSIS — M1288 Other specific arthropathies, not elsewhere classified, other specified site: Secondary | ICD-10-CM | POA: Insufficient documentation

## 2015-02-28 DIAGNOSIS — M5416 Radiculopathy, lumbar region: Secondary | ICD-10-CM | POA: Diagnosis present

## 2015-04-12 DIAGNOSIS — M48061 Spinal stenosis, lumbar region without neurogenic claudication: Secondary | ICD-10-CM | POA: Insufficient documentation

## 2015-04-25 DIAGNOSIS — M48062 Spinal stenosis, lumbar region with neurogenic claudication: Secondary | ICD-10-CM | POA: Insufficient documentation

## 2015-05-02 DIAGNOSIS — E1121 Type 2 diabetes mellitus with diabetic nephropathy: Secondary | ICD-10-CM | POA: Insufficient documentation

## 2015-10-14 DIAGNOSIS — E782 Mixed hyperlipidemia: Secondary | ICD-10-CM | POA: Insufficient documentation

## 2015-10-28 ENCOUNTER — Other Ambulatory Visit: Payer: Self-pay | Admitting: Internal Medicine

## 2015-11-22 ENCOUNTER — Other Ambulatory Visit: Payer: Self-pay | Admitting: Internal Medicine

## 2015-11-22 DIAGNOSIS — Z1231 Encounter for screening mammogram for malignant neoplasm of breast: Secondary | ICD-10-CM

## 2015-12-11 ENCOUNTER — Ambulatory Visit
Admission: RE | Admit: 2015-12-11 | Discharge: 2015-12-11 | Disposition: A | Discharge status: Home / Self Care | Payer: Medicare Other | Source: Ambulatory Visit | Attending: Internal Medicine | Admitting: Internal Medicine

## 2015-12-11 ENCOUNTER — Other Ambulatory Visit: Payer: Self-pay | Admitting: Internal Medicine

## 2015-12-11 DIAGNOSIS — Z1231 Encounter for screening mammogram for malignant neoplasm of breast: Secondary | ICD-10-CM | POA: Insufficient documentation

## 2016-02-05 DIAGNOSIS — K219 Gastro-esophageal reflux disease without esophagitis: Secondary | ICD-10-CM | POA: Insufficient documentation

## 2016-02-05 DIAGNOSIS — I219 Acute myocardial infarction, unspecified: Secondary | ICD-10-CM | POA: Insufficient documentation

## 2016-02-05 DIAGNOSIS — R778 Other specified abnormalities of plasma proteins: Secondary | ICD-10-CM | POA: Insufficient documentation

## 2016-02-05 DIAGNOSIS — E119 Type 2 diabetes mellitus without complications: Secondary | ICD-10-CM | POA: Insufficient documentation

## 2016-02-05 DIAGNOSIS — R7989 Other specified abnormal findings of blood chemistry: Secondary | ICD-10-CM | POA: Insufficient documentation

## 2016-09-24 ENCOUNTER — Ambulatory Visit (INDEPENDENT_AMBULATORY_CARE_PROVIDER_SITE_OTHER): Payer: Medicare Other | Admitting: Vascular Surgery

## 2016-09-24 ENCOUNTER — Encounter (INDEPENDENT_AMBULATORY_CARE_PROVIDER_SITE_OTHER): Payer: Medicare Other

## 2016-09-24 ENCOUNTER — Other Ambulatory Visit (INDEPENDENT_AMBULATORY_CARE_PROVIDER_SITE_OTHER): Payer: Self-pay | Admitting: Vascular Surgery

## 2016-09-24 DIAGNOSIS — I779 Disorder of arteries and arterioles, unspecified: Secondary | ICD-10-CM

## 2016-09-24 DIAGNOSIS — I739 Peripheral vascular disease, unspecified: Principal | ICD-10-CM

## 2016-11-16 ENCOUNTER — Encounter (INDEPENDENT_AMBULATORY_CARE_PROVIDER_SITE_OTHER): Payer: Self-pay | Admitting: Vascular Surgery

## 2016-11-16 ENCOUNTER — Ambulatory Visit (INDEPENDENT_AMBULATORY_CARE_PROVIDER_SITE_OTHER): Payer: Medicare Other

## 2016-11-16 ENCOUNTER — Ambulatory Visit (INDEPENDENT_AMBULATORY_CARE_PROVIDER_SITE_OTHER): Payer: Medicare Other | Admitting: Vascular Surgery

## 2016-11-16 DIAGNOSIS — I779 Disorder of arteries and arterioles, unspecified: Secondary | ICD-10-CM | POA: Diagnosis not present

## 2016-11-16 DIAGNOSIS — I10 Essential (primary) hypertension: Secondary | ICD-10-CM | POA: Diagnosis not present

## 2016-11-16 DIAGNOSIS — I25118 Atherosclerotic heart disease of native coronary artery with other forms of angina pectoris: Secondary | ICD-10-CM

## 2016-11-16 DIAGNOSIS — E782 Mixed hyperlipidemia: Secondary | ICD-10-CM

## 2016-11-16 DIAGNOSIS — I6523 Occlusion and stenosis of bilateral carotid arteries: Secondary | ICD-10-CM | POA: Diagnosis not present

## 2016-11-16 DIAGNOSIS — G629 Polyneuropathy, unspecified: Secondary | ICD-10-CM

## 2016-11-16 DIAGNOSIS — I739 Peripheral vascular disease, unspecified: Principal | ICD-10-CM

## 2016-11-18 DIAGNOSIS — G629 Polyneuropathy, unspecified: Secondary | ICD-10-CM | POA: Insufficient documentation

## 2016-11-18 DIAGNOSIS — E785 Hyperlipidemia, unspecified: Secondary | ICD-10-CM | POA: Insufficient documentation

## 2016-11-18 DIAGNOSIS — I6529 Occlusion and stenosis of unspecified carotid artery: Secondary | ICD-10-CM

## 2016-11-18 DIAGNOSIS — I10 Essential (primary) hypertension: Secondary | ICD-10-CM | POA: Insufficient documentation

## 2016-11-18 DIAGNOSIS — I2510 Atherosclerotic heart disease of native coronary artery without angina pectoris: Secondary | ICD-10-CM | POA: Insufficient documentation

## 2016-11-18 NOTE — Progress Notes (Signed)
MRN : [id number]  [name] is a 83 y.o. (15-Jan-1931) female who presents with chief complaint of  Chief Complaint  Patient presents with  . Carotid    U/S follow up  .  History of Present Illness: The patient is seen for follow up evaluation of carotid stenosis. The carotid stenosis followed by ultrasound.   The patient denies amaurosis fugax. There is no recent history of TIA symptoms or focal motor deficits. There is no prior documented CVA.  The patient is taking enteric-coated aspirin 81 mg daily.  There is no history of migraine headaches. There is no history of seizures.  The patient has a history of coronary artery disease, no recent episodes of angina or shortness of breath. The patient denies PAD or claudication symptoms. There is a history of hyperlipidemia which is being treated with a statin.     Current Meds  Medication Sig  . acetaminophen (TYLENOL) 500 MG tablet Take 1,000 mg by mouth every 6 (six) hours as needed. 1-2 tabs daily as needed For pain  . ALPRAZolam (XANAX) 0.25 MG tablet Take 0.125 mg by mouth 2 (two) times daily as needed for anxiety or sleep.   Marland Kitchen AMLODIPINE BESYLATE PO Take 5 mg by mouth 2 (two) times daily.  Marland Kitchen aspirin EC 81 MG tablet Take 81 mg by mouth daily.  . carvedilol (COREG) 25 MG tablet Take 25 mg by mouth at bedtime.  . cholecalciferol (VITAMIN D) 1000 UNITS tablet Take 1,000 Units by mouth daily.  . cloNIDine (CATAPRES - DOSED IN MG/24 HR) 0.2 mg/24hr patch Place 0.2 mg onto the skin once a week.  . clopidogrel (PLAVIX) 75 MG tablet Take 75 mg by mouth daily.  Marland Kitchen gabapentin (NEURONTIN) 100 MG capsule Take 4 capsules by mouth daily.  Marland Kitchen ibuprofen (ADVIL,MOTRIN) 200 MG tablet Take 400 mg by mouth 2 (two) times daily. For pain  . isosorbide mononitrate (IMDUR) 60 MG 24 hr tablet Take 60 mg by mouth daily.  . lansoprazole (PREVACID) 30 MG capsule Take 30 mg by mouth daily.  . nitroGLYCERIN (NITROSTAT) 0.4 MG SL tablet Place 0.4 mg under  the tongue every 5 (five) minutes as needed. For chest pain  . rosuvastatin (CRESTOR) 20 MG tablet Take 20 mg by mouth daily.  Marland Kitchen senna-docusate (SENOKOT-S) 8.6-50 MG per tablet Take 1 tablet by mouth 2 (two) times daily.  Marland Kitchen spironolactone (ALDACTONE) 25 MG tablet Take 25 mg by mouth daily.  . timolol (BETIMOL) 0.5 % ophthalmic solution Place 1 drop into both eyes every evening.  . torsemide (DEMADEX) 10 MG tablet Take 10 mg by mouth every evening.   . traMADol (ULTRAM) 50 MG tablet Take 1 tablet (50 mg total) by mouth stat.  . [DISCONTINUED] Magnesium 400 MG TABS Take 400 mg by mouth every morning.  . [DISCONTINUED] Multiple Vitamin (MULTIVITAMIN WITH MINERALS) TABS tablet Take 1 tablet by mouth daily.    Past Medical History:  Diagnosis Date  . Hypercholesteremia   . Hypertension   . Myocardial infarction (HCC) 2008  . S/P angioplasty with stent     Past Surgical History:  Procedure Laterality Date  . ABDOMINAL HYSTERECTOMY    . APPENDECTOMY    . CORONARY ANGIOPLASTY WITH STENT PLACEMENT      Social History Social History  Substance Use Topics  . Smoking status: Never Smoker  . Smokeless tobacco: Never Used  . Alcohol use No    Family History Family History  Problem Relation Age of Onset  .  Breast cancer Maternal Aunt     Allergies  Allergen Reactions  . Butenafine Swelling    Tongue swelled and turned red  . Levofloxacin     Other reaction(s): Other (See Comments) Tongue swelled and turned red  . Terbinafine And Related Other (See Comments)    Tongue swelled and turned red     REVIEW OF SYSTEMS (Negative unless checked)  Constitutional: [] Weight loss  [] Fever  [] Chills Cardiac: [] Chest pain   [] Chest pressure   [] Palpitations   [] Shortness of breath when laying flat   [] Shortness of breath with exertion. Vascular:  [] Pain in legs with walking   [] Pain in legs at rest  [] History of DVT   [] Phlebitis   [] Swelling in legs   [] Varicose veins   [] Non-healing  ulcers Pulmonary:   [] Uses home oxygen   [] Productive cough   [] Hemoptysis   [] Wheeze  [] COPD   [] Asthma Neurologic:  [] Dizziness   [] Seizures   [] History of stroke   [] History of TIA  [] Aphasia   [] Vissual changes   [] Weakness or numbness in arm   [] Weakness or numbness in leg Musculoskeletal:   [] Joint swelling   [] Joint pain   [] Low back pain Hematologic:  [] Easy bruising  [] Easy bleeding   [] Hypercoagulable state   [] Anemic Gastrointestinal:  [] Diarrhea   [] Vomiting  [] Gastroesophageal reflux/heartburn   [] Difficulty swallowing. Genitourinary:  [] Chronic kidney disease   [] Difficult urination  [] Frequent urination   [] Blood in urine Skin:  [] Rashes   [] Ulcers  Psychological:  [] History of anxiety   []  History of major depression.  Physical Examination  Vitals:   11/16/16 1129 11/16/16 1130  BP: (!) 151/62 (!) 154/61  Pulse: (!) 56 (!) 52  Resp: 16   Weight: 67.6 kg (149 lb)   Height: 5\' 1"  (1.549 m)    Body mass index is 28.15 kg/m. Gen: WD/WN, NAD Head: [place]/AT, No temporalis wasting.  Ear/Nose/Throat: Hearing grossly intact, nares w/o erythema or drainage Eyes: PER, EOMI, sclera nonicteric.  Neck: Supple, no large masses.   Pulmonary:  Good air movement, no audible wheezing bilaterally, no use of accessory muscles.  Cardiac: RRR, no JVD Vascular: bilateral carotid bruits  Vessel Right Left  Radial Palpable Palpable  Ulnar Palpable Palpable  Brachial Palpable Palpable  Carotid Palpable Palpable  Femoral Palpable Palpable  Popliteal Palpable Palpable  PT Palpable Palpable  DP Palpable Palpable  Gastrointestinal: Non-distended. No guarding/no peritoneal signs.  Musculoskeletal: M/S 5/5 throughout.  No deformity or atrophy.  Neurologic: CN 2-12 intact. Symmetrical.  Speech is fluent. Motor exam as listed above. Psychiatric: Judgment intact, Mood & affect appropriate for pt's clinical situation. Dermatologic: No rashes or ulcers noted.  No changes consistent with  cellulitis. Lymph : No lichenification or skin changes of chronic lymphedema.  CBC Lab Results  Component Value Date   WBC 8.1 11/16/2014   HGB 12.6 11/16/2014   HCT 37.3 11/16/2014   MCV 95.9 11/16/2014   PLT 262 11/16/2014    BMET    Component Value Date/Time   NA 134 (L) 11/16/2014 0119   NA 139 08/16/2012 0255   K 4.2 11/16/2014 0119   K 3.9 08/16/2012 0255   CL 98 (L) 11/16/2014 0119   CL 108 (H) 08/16/2012 0255   CO2 25 11/16/2014 0119   CO2 25 08/16/2012 0255   GLUCOSE 163 (H) 11/16/2014 0119   GLUCOSE 129 (H) 08/16/2012 0255   BUN 20 11/16/2014 0119   BUN 21 (H) 08/16/2012 0255   CREATININE 1.05 (H)  11/16/2014 0119   CREATININE 1.43 (H) 08/16/2012 0255   CALCIUM 9.9 11/16/2014 0119   CALCIUM 8.5 08/16/2012 0255   GFRNONAA 48 (L) 11/16/2014 0119   GFRNONAA 35 (L) 08/16/2012 0255   GFRAA 56 (L) 11/16/2014 0119   GFRAA 40 (L) 08/16/2012 0255   CrCl cannot be calculated (Patient's most recent lab result is older than the maximum 21 days allowed.).  COAG Lab Results  Component Value Date   INR 0.9 08/15/2012    Radiology No results found.  Assessment/Plan 1. Bilateral carotid artery stenosis Recommend:  Given the patient's asymptomatic subcritical stenosis no further invasive testing or surgery at this time.  Duplex ultrasound shows 40-59% stenosis bilaterally.  Continue antiplatelet therapy as prescribed Continue management of CAD, HTN and Hyperlipidemia Healthy heart diet,  encouraged exercise at least 4 times per week Follow up in 12 months with duplex ultrasound and physical exam based on >50% stenosis of the bilateral carotid artery   - VAS US CAROTID; Future  2. Essential hypertension Continue antihypertensive medications as already ordered, these medications have been reviewed and there are no changes at this time.   3. Neuropathy Continue Neurontin as already ordered, these medications have been reviewed and there are no changes at this  time.   4. Coronary artery disease of native artery of native heart with stable angina pectoris (HCC) Continue cardiac and antihypertensive medications as already ordered and reviewed, no changes at this time.  Continue statin as ordered and reviewed, no changes at this time  Nitrates PRN for chest pain   5. Mixed hyperlipidemia Continue statin as ordered and reviewed, no changes at this time     Levora Dredge, MD  11/18/2016 10:40 AM

## 2017-03-15 ENCOUNTER — Encounter: Payer: Self-pay | Admitting: Emergency Medicine

## 2017-03-15 ENCOUNTER — Emergency Department
Admission: EM | Admit: 2017-03-15 | Discharge: 2017-03-15 | Disposition: A | Discharge status: Home / Self Care | Payer: Medicare Other | Attending: Emergency Medicine | Admitting: Emergency Medicine

## 2017-03-15 ENCOUNTER — Other Ambulatory Visit: Payer: Self-pay

## 2017-03-15 DIAGNOSIS — Z7982 Long term (current) use of aspirin: Secondary | ICD-10-CM | POA: Diagnosis not present

## 2017-03-15 DIAGNOSIS — I10 Essential (primary) hypertension: Secondary | ICD-10-CM | POA: Diagnosis not present

## 2017-03-15 DIAGNOSIS — G8929 Other chronic pain: Secondary | ICD-10-CM

## 2017-03-15 DIAGNOSIS — I252 Old myocardial infarction: Secondary | ICD-10-CM | POA: Insufficient documentation

## 2017-03-15 DIAGNOSIS — I2510 Atherosclerotic heart disease of native coronary artery without angina pectoris: Secondary | ICD-10-CM | POA: Diagnosis not present

## 2017-03-15 DIAGNOSIS — Z79899 Other long term (current) drug therapy: Secondary | ICD-10-CM | POA: Diagnosis not present

## 2017-03-15 DIAGNOSIS — R1031 Right lower quadrant pain: Secondary | ICD-10-CM | POA: Diagnosis present

## 2017-03-15 DIAGNOSIS — Z955 Presence of coronary angioplasty implant and graft: Secondary | ICD-10-CM | POA: Insufficient documentation

## 2017-03-15 DIAGNOSIS — M545 Low back pain, unspecified: Secondary | ICD-10-CM

## 2017-03-15 LAB — URINALYSIS, COMPLETE (UACMP) WITH MICROSCOPIC
BILIRUBIN URINE: NEGATIVE
Bacteria, UA: NONE SEEN
Glucose, UA: NEGATIVE mg/dL
HGB URINE DIPSTICK: NEGATIVE
KETONES UR: NEGATIVE mg/dL
LEUKOCYTES UA: NEGATIVE
NITRITE: NEGATIVE
PH: 6 (ref 5.0–8.0)
PROTEIN: NEGATIVE mg/dL
Specific Gravity, Urine: 1.005 (ref 1.005–1.030)
Squamous Epithelial / LPF: NONE SEEN

## 2017-03-15 LAB — BASIC METABOLIC PANEL
ANION GAP: 8 (ref 5–15)
BUN: 24 mg/dL — AB (ref 6–20)
CALCIUM: 9.3 mg/dL (ref 8.9–10.3)
CO2: 26 mmol/L (ref 22–32)
Chloride: 102 mmol/L (ref 101–111)
Creatinine, Ser: 1.35 mg/dL — ABNORMAL HIGH (ref 0.44–1.00)
GFR calc Af Amer: 41 mL/min — ABNORMAL LOW (ref >60)
GFR, EST NON AFRICAN AMERICAN: 35 mL/min — AB (ref >60)
GLUCOSE: 192 mg/dL — AB (ref 65–99)
Potassium: 3.8 mmol/L (ref 3.5–5.1)
Sodium: 136 mmol/L (ref 135–145)

## 2017-03-15 LAB — CBC
HCT: 32.7 % — ABNORMAL LOW (ref 35.0–47.0)
Hemoglobin: 11.4 g/dL — ABNORMAL LOW (ref 12.0–16.0)
MCH: 33 pg (ref 26.0–34.0)
MCHC: 34.8 g/dL (ref 32.0–36.0)
MCV: 94.9 fL (ref 80.0–100.0)
Platelets: 255 10*3/uL (ref 150–440)
RBC: 3.45 MIL/uL — ABNORMAL LOW (ref 3.80–5.20)
RDW: 13.5 % (ref 11.5–14.5)
WBC: 8.6 10*3/uL (ref 3.6–11.0)

## 2017-03-15 MED ORDER — HYDROCODONE-ACETAMINOPHEN 5-325 MG PO TABS: 1.0000 | ORAL_TABLET | Freq: Four times a day (QID) | ORAL | 0 refills | Status: DC | PRN

## 2017-03-15 MED ORDER — HYDROCODONE-ACETAMINOPHEN 5-325 MG PO TABS
1.0000 | ORAL_TABLET | Freq: Once | ORAL | Status: AC
  Administered 2017-03-15: 1 via ORAL
  Filled 2017-03-15: qty 1

## 2017-03-15 NOTE — ED Triage Notes (Signed)
Pt reports that she developed Right flank pain last night. She states that it came on all the sudden and it "cathes". Denies any pain or blood when she urinates.

## 2017-03-15 NOTE — Discharge Instructions (Signed)
Please take your pain medication as needed for severe symptoms and follow-up with your primary care physician in 2 days for recheck.  Return to the emergency department sooner for any concerns whatsoever such as fevers, chills, numbness, weakness, worsening pain, or for any other issues whatsoever.  It was a pleasure to take care of you today, and thank you for coming to our emergency department.  If you have any questions or concerns before leaving please ask the nurse to grab me and I'm more than happy to go through your aftercare instructions again.  If you were prescribed any opioid pain medication today such as Norco, Vicodin, Percocet, morphine, hydrocodone, or oxycodone please make sure you do not drive when you are taking this medication as it can alter your ability to drive safely.  If you have any concerns once you are home that you are not improving or are in fact getting worse before you can make it to your follow-up appointment, please do not hesitate to call 911 and come back for further evaluation.  Merrily Brittle, MD  Results for orders placed or performed during the hospital encounter of 03/15/17  Urinalysis, Complete w Microscopic  Result Value Ref Range   Color, Urine STRAW (A) YELLOW   APPearance CLEAR (A) CLEAR   Specific Gravity, Urine 1.005 1.005 - 1.030   pH 6.0 5.0 - 8.0   Glucose, UA NEGATIVE NEGATIVE mg/dL   Hgb urine dipstick NEGATIVE NEGATIVE   Bilirubin Urine NEGATIVE NEGATIVE   Ketones, ur NEGATIVE NEGATIVE mg/dL   Protein, ur NEGATIVE NEGATIVE mg/dL   Nitrite NEGATIVE NEGATIVE   Leukocytes, UA NEGATIVE NEGATIVE   RBC / HPF 0-5 0 - 5 RBC/hpf   WBC, UA 0-5 0 - 5 WBC/hpf   Bacteria, UA NONE SEEN NONE SEEN   Squamous Epithelial / LPF NONE SEEN NONE SEEN   Hyaline Casts, UA PRESENT   Basic metabolic panel  Result Value Ref Range   Sodium 136 135 - 145 mmol/L   Potassium 3.8 3.5 - 5.1 mmol/L   Chloride 102 101 - 111 mmol/L   CO2 26 22 - 32 mmol/L   Glucose, Bld 192 (H) 65 - 99 mg/dL   BUN 24 (H) 6 - 20 mg/dL   Creatinine, Ser 6.26 (H) 0.44 - 1.00 mg/dL   Calcium 9.3 8.9 - 94.8 mg/dL   GFR calc non Af Amer 35 (L) >60 mL/min   GFR calc Af Amer 41 (L) >60 mL/min   Anion gap 8 5 - 15  CBC  Result Value Ref Range   WBC 8.6 3.6 - 11.0 K/uL   RBC 3.45 (L) 3.80 - 5.20 MIL/uL   Hemoglobin 11.4 (L) 12.0 - 16.0 g/dL   HCT 54.6 (L) 27.0 - 35.0 %   MCV 94.9 80.0 - 100.0 fL   MCH 33.0 26.0 - 34.0 pg   MCHC 34.8 32.0 - 36.0 g/dL   RDW 09.3 81.8 - 29.9 %   Platelets 255 150 - 440 K/uL

## 2017-03-15 NOTE — ED Provider Notes (Signed)
[facility] Emergency Department Provider Note  ____________________________________________   First MD Initiated Contact with Patient 03/15/17 1107     (approximate)  I have reviewed the triage vital signs and the nursing notes.   HISTORY  Chief Complaint Flank Pain    HPI [name] is a 83 y.o. female who self presents the emergency department with roughly 24 hours of throbbing aching right flank pain that is nonradiating.  Pain is moderate to severe at its worst.  It is worse with twisting lifting or moving.  Improved with rest.  She has a long-standing history of chronic low back pain and arthritis.  She denies numbness or weakness.  She denies bowel or bladder incontinence or recent manipulation surgically or injection.  She denies hematuria dysuria frequency or hesitancy.  Past Medical History:  Diagnosis Date  . Hypercholesteremia   . Hypertension   . Myocardial infarction (HCC) 2008  . S/P angioplasty with stent     Patient Active Problem List   Diagnosis Date Noted  . Carotid stenosis 11/18/2016  . Essential hypertension 11/18/2016  . Neuropathy 11/18/2016  . CAD (coronary artery disease) 11/18/2016  . Hyperlipidemia 11/18/2016  . Chest pain 11/16/2014    Past Surgical History:  Procedure Laterality Date  . ABDOMINAL HYSTERECTOMY    . APPENDECTOMY    . CORONARY ANGIOPLASTY WITH STENT PLACEMENT      Prior to Admission medications   Medication Sig Start Date End Date Taking? Authorizing Provider  acetaminophen (TYLENOL) 500 MG tablet Take 1,000 mg by mouth every 6 (six) hours as needed. 1-2 tabs daily as needed For pain    [provider]  ALPRAZolam (XANAX) 0.25 MG tablet Take 0.125 mg by mouth 2 (two) times daily as needed for anxiety or sleep.     [provider]  AMLODIPINE BESYLATE PO Take 5 mg by mouth 2 (two) times daily.    [provider]  aspirin EC 81 MG tablet Take 81 mg by mouth daily.     [provider]  carvedilol (COREG) 25 MG tablet Take 25 mg by mouth at bedtime.    [provider]  cholecalciferol (VITAMIN D) 1000 UNITS tablet Take 1,000 Units by mouth daily.    [provider]  cloNIDine (CATAPRES - DOSED IN MG/24 HR) 0.2 mg/24hr patch Place 0.2 mg onto the skin once a week.    [provider]  clopidogrel (PLAVIX) 75 MG tablet Take 75 mg by mouth daily.    [provider]  gabapentin (NEURONTIN) 100 MG capsule Take 4 capsules by mouth daily. 02/14/15   [provider]  HYDROcodone-acetaminophen (NORCO) 5-325 MG tablet Take 1 tablet by mouth every 6 (six) hours as needed for up to 7 doses for severe pain. 03/15/17   Merrily Brittle, MD  ibuprofen (ADVIL,MOTRIN) 200 MG tablet Take 400 mg by mouth 2 (two) times daily. For pain    [provider]  isosorbide mononitrate (IMDUR) 60 MG 24 hr tablet Take 60 mg by mouth daily.    [provider]  lansoprazole (PREVACID) 30 MG capsule Take 30 mg by mouth daily.    [provider]  nitroGLYCERIN (NITROSTAT) 0.4 MG SL tablet Place 0.4 mg under the tongue every 5 (five) minutes as needed. For chest pain    [provider]  rosuvastatin (CRESTOR) 20 MG tablet Take 20 mg by mouth daily.    [provider]  senna-docusate (SENOKOT-S) 8.6-50 MG per tablet Take  1 tablet by mouth 2 (two) times daily.    [provider]  spironolactone (ALDACTONE) 25 MG tablet Take 25 mg by mouth daily.    [provider]  timolol (BETIMOL) 0.5 % ophthalmic solution Place 1 drop into both eyes every evening.    [provider]  torsemide (DEMADEX) 10 MG tablet Take 10 mg by mouth every evening.     [provider]  traMADol (ULTRAM) 50 MG tablet Take 1 tablet (50 mg total) by mouth stat. 02/17/15   Sharyn CreamerQuale, Mark, MD    Allergies Butenafine; Levofloxacin; and Terbinafine and related  Family History  Problem Relation Age of  Onset  . Breast cancer Maternal Aunt     Social History Social History   Tobacco Use  . Smoking status: Never Smoker  . Smokeless tobacco: Never Used  Substance Use Topics  . Alcohol use: No  . Drug use: No    Review of Systems Constitutional: No fever/chills Eyes: No visual changes. ENT: No sore throat. Cardiovascular: Denies chest pain. Respiratory: Denies shortness of breath. Gastrointestinal: No abdominal pain.  No nausea, no vomiting.  No diarrhea.  No constipation. Genitourinary: Negative for dysuria. Musculoskeletal: Positive for for back pain. Skin: Negative for rash. Neurological: Negative for headaches, focal weakness or numbness.   ____________________________________________   PHYSICAL EXAM:  VITAL SIGNS: ED Triage Vitals  Enc Vitals Group     BP 03/15/17 1001 (!) 122/43     Pulse Rate 03/15/17 1001 (!) 59     Resp 03/15/17 1001 20     Temp 03/15/17 1001 98.2 F (36.8 C)     Temp Source 03/15/17 1001 Oral     SpO2 03/15/17 1001 98 %     Weight 03/15/17 1002 150 lb (68 kg)     Height 03/15/17 1002 5\' 3"  (1.6 m)     Head Circumference --      Peak Flow --      Pain Score --      Pain Loc --      Pain Edu? --      Excl. in GC? --     Constitutional: Alert and oriented x4 appears somewhat uncomfortable holding her right low back no diaphoresis speaks in full clear sentences Eyes: PERRL EOMI. Head: Atraumatic. Nose: No congestion/rhinnorhea. Mouth/Throat: No trismus Neck: No stridor.   Cardiovascular: Cardiac rate, regular rhythm. Grossly normal heart sounds.  Good peripheral circulation. Respiratory: Normal respiratory effort.  No retractions. Lungs CTAB and moving good air Gastrointestinal: Soft nondistended nontender no rebound or guarding no peritonitis Musculoskeletal: No lower extremity edema   No midline back tenderness she is tender right lumbar spine with some spasm 5 out of 5 hip flexion extension plantar flexion dorsiflexion DTRs no  ankle clonus sensation intact light touch throughout Neurologic:  Normal speech and language. No gross focal neurologic deficits are appreciated. Skin:  Skin is warm, dry and intact. No rash noted. Psychiatric: Mood and affect are normal. Speech and behavior are normal.    ____________________________________________   DIFFERENTIAL includes but not limited to  Kidney stone, pyelonephritis, muscle spasm, radicular pain, cauda equina, renal infarction ____________________________________________   LABS (all labs ordered are listed, but only abnormal results are displayed)  Labs Reviewed  URINALYSIS, COMPLETE (UACMP) WITH MICROSCOPIC - Abnormal; Notable for the following components:      Result Value   Color, Urine STRAW (*)    APPearance CLEAR (*)    All other components within normal limits  BASIC  METABOLIC PANEL - Abnormal; Notable for the following components:   Glucose, Bld 192 (*)    BUN 24 (*)    Creatinine, Ser 1.35 (*)    GFR calc non Af Amer 35 (*)    GFR calc Af Amer 41 (*)    All other components within normal limits  CBC - Abnormal; Notable for the following components:   RBC 3.45 (*)    Hemoglobin 11.4 (*)    HCT 32.7 (*)    All other components within normal limits    Blood work reviewed by me with no acute disease noted.  Chronic kidney disease at baseline __________________________________________  EKG   ____________________________________________  RADIOLOGY   ____________________________________________   PROCEDURES  Procedure(s) performed: no  Procedures  Critical Care performed: no  Observation: no ____________________________________________   INITIAL IMPRESSION / ASSESSMENT AND PLAN / ED COURSE  Pertinent labs & imaging results that were available during my care of the patient were reviewed by me and considered in my medical decision making (see chart for details).  The patient arrives somewhat uncomfortable appearing with  intermittent aching right sided low back pain that is worse with movement and touch and improved with rest.  She has normal neurological exam.  On review of her MRI from 2 years ago she has significant arthritis in this in her low back.  I had a lengthy discussion with the patient and given her normal neurological symptoms I do not believe she requires advanced imaging at this time.  She was given 1 hydrocodone and her pain is improved.  I have advised her to follow-up with her primary care physician in 2 days for recheck and strict return precautions have been given.  The patient and her son verbalized understanding and agreed with the plan.      ____________________________________________   FINAL CLINICAL IMPRESSION(S) / ED DIAGNOSES  Final diagnoses:  Chronic right-sided low back pain without sciatica      NEW MEDICATIONS STARTED DURING THIS VISIT:  This SmartLink is deprecated. Use AVSMEDLIST instead to display the medication list for a patient.   Note:  This document was prepared using Dragon voice recognition software and may include unintentional dictation errors.     Merrily Brittle, MD 03/15/17 1143

## 2017-04-06 DIAGNOSIS — N183 Chronic kidney disease, stage 3 unspecified: Secondary | ICD-10-CM | POA: Insufficient documentation

## 2017-08-13 ENCOUNTER — Emergency Department
Admission: EM | Admit: 2017-08-13 | Discharge: 2017-08-13 | Disposition: A | Discharge status: Home / Self Care | Payer: Medicare Other | Attending: Emergency Medicine | Admitting: Emergency Medicine

## 2017-08-13 ENCOUNTER — Emergency Department: Payer: Medicare Other

## 2017-08-13 ENCOUNTER — Encounter: Payer: Self-pay | Admitting: Emergency Medicine

## 2017-08-13 DIAGNOSIS — E860 Dehydration: Secondary | ICD-10-CM | POA: Diagnosis not present

## 2017-08-13 DIAGNOSIS — I10 Essential (primary) hypertension: Secondary | ICD-10-CM | POA: Diagnosis not present

## 2017-08-13 DIAGNOSIS — K5900 Constipation, unspecified: Secondary | ICD-10-CM | POA: Insufficient documentation

## 2017-08-13 DIAGNOSIS — Z79899 Other long term (current) drug therapy: Secondary | ICD-10-CM | POA: Insufficient documentation

## 2017-08-13 DIAGNOSIS — R55 Syncope and collapse: Secondary | ICD-10-CM | POA: Insufficient documentation

## 2017-08-13 DIAGNOSIS — Z955 Presence of coronary angioplasty implant and graft: Secondary | ICD-10-CM | POA: Insufficient documentation

## 2017-08-13 DIAGNOSIS — Z7982 Long term (current) use of aspirin: Secondary | ICD-10-CM | POA: Insufficient documentation

## 2017-08-13 DIAGNOSIS — I259 Chronic ischemic heart disease, unspecified: Secondary | ICD-10-CM | POA: Diagnosis not present

## 2017-08-13 DIAGNOSIS — Z7902 Long term (current) use of antithrombotics/antiplatelets: Secondary | ICD-10-CM | POA: Diagnosis not present

## 2017-08-13 LAB — CBC WITH DIFFERENTIAL/PLATELET
BASOS PCT: 1 %
Basophils Absolute: 0.1 10*3/uL (ref 0–0.1)
Eosinophils Absolute: 0.5 10*3/uL (ref 0–0.7)
Eosinophils Relative: 6 %
HEMATOCRIT: 32 % — AB (ref 35.0–47.0)
HEMOGLOBIN: 11.1 g/dL — AB (ref 12.0–16.0)
LYMPHS ABS: 1.9 10*3/uL (ref 1.0–3.6)
Lymphocytes Relative: 23 %
MCH: 33 pg (ref 26.0–34.0)
MCHC: 34.8 g/dL (ref 32.0–36.0)
MCV: 94.9 fL (ref 80.0–100.0)
MONO ABS: 1.1 10*3/uL — AB (ref 0.2–0.9)
MONOS PCT: 13 %
NEUTROS ABS: 5 10*3/uL (ref 1.4–6.5)
Neutrophils Relative %: 57 %
Platelets: 261 10*3/uL (ref 150–440)
RBC: 3.38 MIL/uL — ABNORMAL LOW (ref 3.80–5.20)
RDW: 13.3 % (ref 11.5–14.5)
WBC: 8.6 10*3/uL (ref 3.6–11.0)

## 2017-08-13 LAB — GASTROINTESTINAL PANEL BY PCR, STOOL (REPLACES STOOL CULTURE)
ASTROVIRUS: NOT DETECTED
Adenovirus F40/41: NOT DETECTED
CAMPYLOBACTER SPECIES: NOT DETECTED
CRYPTOSPORIDIUM: NOT DETECTED
Cyclospora cayetanensis: NOT DETECTED
ENTEROAGGREGATIVE E COLI (EAEC): NOT DETECTED
ENTEROPATHOGENIC E COLI (EPEC): NOT DETECTED
ENTEROTOXIGENIC E COLI (ETEC): NOT DETECTED
Entamoeba histolytica: NOT DETECTED
GIARDIA LAMBLIA: NOT DETECTED
Norovirus GI/GII: NOT DETECTED
PLESIMONAS SHIGELLOIDES: NOT DETECTED
ROTAVIRUS A: NOT DETECTED
Salmonella species: NOT DETECTED
Sapovirus (I, II, IV, and V): NOT DETECTED
Shiga like toxin producing E coli (STEC): NOT DETECTED
Shigella/Enteroinvasive E coli (EIEC): NOT DETECTED
Vibrio cholerae: NOT DETECTED
Vibrio species: NOT DETECTED
Yersinia enterocolitica: NOT DETECTED

## 2017-08-13 LAB — COMPREHENSIVE METABOLIC PANEL
ALBUMIN: 3.8 g/dL (ref 3.5–5.0)
ALK PHOS: 66 U/L (ref 38–126)
ALT: 14 U/L (ref 14–54)
AST: 21 U/L (ref 15–41)
Anion gap: 11 (ref 5–15)
BILIRUBIN TOTAL: 0.5 mg/dL (ref 0.3–1.2)
BUN: 23 mg/dL — ABNORMAL HIGH (ref 6–20)
CALCIUM: 8.9 mg/dL (ref 8.9–10.3)
CO2: 23 mmol/L (ref 22–32)
Chloride: 104 mmol/L (ref 101–111)
Creatinine, Ser: 1.71 mg/dL — ABNORMAL HIGH (ref 0.44–1.00)
GFR, EST AFRICAN AMERICAN: 30 mL/min — AB (ref >60)
GFR, EST NON AFRICAN AMERICAN: 26 mL/min — AB (ref >60)
Glucose, Bld: 177 mg/dL — ABNORMAL HIGH (ref 65–99)
POTASSIUM: 3.7 mmol/L (ref 3.5–5.1)
Sodium: 138 mmol/L (ref 135–145)
Total Protein: 6.7 g/dL (ref 6.5–8.1)

## 2017-08-13 LAB — LACTIC ACID, PLASMA: LACTIC ACID, VENOUS: 1.6 mmol/L (ref 0.5–1.9)

## 2017-08-13 LAB — C DIFFICILE QUICK SCREEN W PCR REFLEX
C DIFFICILE (CDIFF) INTERP: NOT DETECTED
C DIFFICILE (CDIFF) TOXIN: NEGATIVE
C Diff antigen: NEGATIVE

## 2017-08-13 LAB — LIPASE, BLOOD: LIPASE: 24 U/L (ref 11–51)

## 2017-08-13 LAB — TROPONIN I

## 2017-08-13 MED ORDER — ONDANSETRON HCL 4 MG/2ML IJ SOLN
4.0000 mg | Freq: Once | INTRAMUSCULAR | Status: AC
  Administered 2017-08-13: 4 mg via INTRAVENOUS

## 2017-08-13 MED ORDER — NALOXONE HCL 2 MG/2ML IJ SOSY
PREFILLED_SYRINGE | INTRAMUSCULAR | Status: AC
  Administered 2017-08-13: 0.4 mg via INTRAVENOUS
  Filled 2017-08-13: qty 2

## 2017-08-13 MED ORDER — ONDANSETRON HCL 4 MG/2ML IJ SOLN
INTRAMUSCULAR | Status: AC
  Filled 2017-08-13: qty 2

## 2017-08-13 MED ORDER — NALOXONE HCL 2 MG/2ML IJ SOSY
0.4000 mg | PREFILLED_SYRINGE | Freq: Once | INTRAMUSCULAR | Status: AC
  Administered 2017-08-13: 0.4 mg via INTRAVENOUS

## 2017-08-13 NOTE — ED Triage Notes (Signed)
Pt arrived from home via EMS where report by family was pt had syncopal episode while setting at table eating. Pt pale in color, pt c/o that she need to have BM. MD at bedside.

## 2017-08-13 NOTE — ED Provider Notes (Signed)
[facility] Emergency Department Provider Note  ____________________________________________   First MD Initiated Contact with Patient 08/13/17 [phone]     (approximate)  I have reviewed the triage vital signs and the nursing notes.   HISTORY  Chief Complaint Loss of Consciousness  History limited by the patient's clinical condition  HPI [name] is a 84 y.o. female who comes to the emergency department via EMS after a syncopal event.  According to EMS they were initially dispatched for a cardiac arrest but when they arrived the patient was diaphoretic bradycardic and ill-appearing and perseverated that she had to "move my bowels".  Normal blood sugar in route.  Further history limited by the patient's clinical condition.  Past Medical History:  Diagnosis Date  . Hypercholesteremia   . Hypertension   . Myocardial infarction (HCC) 2008  . S/P angioplasty with stent     Patient Active Problem List   Diagnosis Date Noted  . Carotid stenosis 11/18/2016  . Essential hypertension 11/18/2016  . Neuropathy 11/18/2016  . CAD (coronary artery disease) 11/18/2016  . Hyperlipidemia 11/18/2016  . Chest pain 11/16/2014    Past Surgical History:  Procedure Laterality Date  . ABDOMINAL HYSTERECTOMY    . APPENDECTOMY    . CORONARY ANGIOPLASTY WITH STENT PLACEMENT      Prior to Admission medications   Medication Sig Start Date End Date Taking? Authorizing Provider  acetaminophen (TYLENOL) 500 MG tablet Take 1,000 mg by mouth every 6 (six) hours as needed. 1-2 tabs daily as needed For pain    [provider]  ALPRAZolam (XANAX) 0.25 MG tablet Take 0.125 mg by mouth 2 (two) times daily as needed for anxiety or sleep.     [provider]  AMLODIPINE BESYLATE PO Take 5 mg by mouth 2 (two) times daily.    [provider]  aspirin EC 81 MG tablet Take 81 mg by mouth daily.    [provider]  carvedilol (COREG) 25 MG tablet  Take 25 mg by mouth at bedtime.    [provider]  cholecalciferol (VITAMIN D) 1000 UNITS tablet Take 1,000 Units by mouth daily.    [provider]  cloNIDine (CATAPRES - DOSED IN MG/24 HR) 0.2 mg/24hr patch Place 0.2 mg onto the skin once a week.    [provider]  clopidogrel (PLAVIX) 75 MG tablet Take 75 mg by mouth daily.    [provider]  gabapentin (NEURONTIN) 100 MG capsule Take 4 capsules by mouth daily. 02/14/15   [provider]  HYDROcodone-acetaminophen (NORCO) 5-325 MG tablet Take 1 tablet by mouth every 6 (six) hours as needed for up to 7 doses for severe pain. 03/15/17   Merrily Brittle, MD  ibuprofen (ADVIL,MOTRIN) 200 MG tablet Take 400 mg by mouth 2 (two) times daily. For pain    [provider]  isosorbide mononitrate (IMDUR) 60 MG 24 hr tablet Take 60 mg by mouth daily.    [provider]  lansoprazole (PREVACID) 30 MG capsule Take 30 mg by mouth daily.    [provider]  nitroGLYCERIN (NITROSTAT) 0.4 MG SL tablet Place 0.4 mg under the tongue every 5 (five) minutes as needed. For chest pain    [provider]  rosuvastatin (CRESTOR) 20 MG tablet Take 20 mg by mouth daily.    [provider]  senna-docusate (SENOKOT-S) 8.6-50 MG per tablet Take 1 tablet by mouth 2 (two) times daily.    [provider]  spironolactone (ALDACTONE) 25 MG tablet Take 25 mg by mouth daily.    [provider]  timolol (BETIMOL) 0.5 % ophthalmic solution Place 1 drop into both eyes every evening.    [provider]  torsemide (DEMADEX) 10 MG tablet Take 10 mg by mouth every evening.     [provider]  traMADol (ULTRAM) 50 MG tablet Take 1 tablet (50 mg total) by mouth stat. 02/17/15   Sharyn Creamer, MD    Allergies Butenafine; Levofloxacin; and Terbinafine and related  Family History  Problem Relation Age of Onset  . Breast cancer Maternal Aunt     Social  History Social History   Tobacco Use  . Smoking status: Never Smoker  . Smokeless tobacco: Never Used  Substance Use Topics  . Alcohol use: No  . Drug use: No    Review of Systems History limited by the patient's clinical condition  ____________________________________________   PHYSICAL EXAM:  VITAL SIGNS: ED Triage Vitals [08/13/17 0053]  Enc Vitals Group     BP      Pulse      Resp      Temp      Temp src      SpO2      Weight 150 lb (68 kg)     Height      Head Circumference      Peak Flow      Pain Score      Pain Loc      Pain Edu?      Excl. in GC?     Constitutional: Appears uncomfortable, fidgeting and agitated, diaphoretic Eyes: PERRL EOMI. midrange and brisk Head: Atraumatic. Nose: No congestion/rhinnorhea. Mouth/Throat: No trismus Neck: No stridor.   Cardiovascular: Normal rate, regular rhythm. Grossly normal heart sounds.  Good peripheral circulation. Respiratory: Increased respiratory effort.  No retractions. Lungs CTAB and moving good air Gastrointestinal: Soft nondistended nontender no rebound or guarding no peritonitis no masses palpated Bedside ultrasound performed no AAA noted no free fluid Musculoskeletal: No lower extremity edema   Neurologic:  No gross focal neurologic deficits are appreciated. Skin:  Skin is warm, dry and intact. No rash noted. Psychiatric: She does appear quite anxious   ____________________________________________   DIFFERENTIAL includes but not limited to  AAA, STEMI, drug overdose, renal failure ____________________________________________   LABS (all labs ordered are listed, but only abnormal results are displayed)  Labs Reviewed  CBC WITH DIFFERENTIAL/PLATELET - Abnormal; Notable for the following components:      Result Value   RBC 3.38 (*)    Hemoglobin 11.1 (*)    HCT 32.0 (*)    Monocytes Absolute 1.1 (*)    All other components within normal limits  COMPREHENSIVE METABOLIC PANEL - Abnormal;  Notable for the following components:   Glucose, Bld 177 (*)    BUN 23 (*)    Creatinine, Ser 1.71 (*)    GFR calc non Af Amer 26 (*)    GFR calc Af Amer 30 (*)    All other components within normal limits  GASTROINTESTINAL PANEL BY PCR, STOOL (REPLACES STOOL CULTURE)  C DIFFICILE QUICK SCREEN W PCR REFLEX  TROPONIN I  LACTIC ACID, PLASMA  LIPASE, BLOOD    Lab work reviewed by me with slight increase in creatinine __________________________________________  EKG  ED ECG REPORT I, Merrily Brittle, the attending physician, personally viewed and interpreted this ECG.  Date: 08/14/2017 EKG Time:  Rate: 61 Rhythm: normal sinus rhythm QRS Axis: normal Intervals:  First-degree AV block ST/T Wave abnormalities: normal Narrative Interpretation: no evidence of acute ischemia  ____________________________________________  RADIOLOGY  Head CT reviewed by me with no acute disease CT abdomen pelvis reviewed by me with no acute disease ____________________________________________   PROCEDURES  Procedure(s) performed: no  Procedures  Critical Care performed: no  Observation: no ____________________________________________   INITIAL IMPRESSION / ASSESSMENT AND PLAN / ED COURSE  Pertinent labs & imaging results that were available during my care of the patient were reviewed by me and considered in my medical decision making (see chart for details).  On arrival the patient is diaphoretic and uncomfortable appearing although hemodynamically stable.  Differential is extremely broad and most pressing for me given her abdominal pain and diaphoresis is AAA.  Bedside ultrasound shows fluid and no AAA.  Broad labs EKG head and normal CTs are pending.  Patient had a large bowel movement and nearly immediately felt improved.  Work-up is still pending but I do suspect that this could have been contributed by a vasovagal episode.     ----------------------------------------- 3:28 AM  on 08/13/2017 -----------------------------------------  The patient feels improved.  She has been on monitor several hours with no ectopy.  Son is at bedside.  She is discharged home in improved condition verbalized understanding agreement plan with strict return precautions were ____________________________________________   FINAL CLINICAL IMPRESSION(S) / ED DIAGNOSES  Final diagnoses:  Syncope and collapse  Dehydration  Constipation, unspecified constipation type      NEW MEDICATIONS STARTED DURING THIS VISIT:  Discharge Medication List as of 08/13/2017  3:28 AM       Note:  This document was prepared using Dragon voice recognition software and may include unintentional dictation errors.     Merrily Brittle, MD 08/14/17 1102

## 2017-08-13 NOTE — Discharge Instructions (Signed)
Please make sure you remain well-hydrated and follow-up with your primary care physician this coming Monday for recheck.  Return to the emergency department sooner for any new or worsening symptoms such as fevers, chills, chest pain, shortness of breath, or for any other issues whatsoever.  It was a pleasure to take care of you today, and thank you for coming to our emergency department.  If you have any questions or concerns before leaving please ask the nurse to grab me and I'm more than happy to go through your aftercare instructions again.  If you were prescribed any opioid pain medication today such as Norco, Vicodin, Percocet, morphine, hydrocodone, or oxycodone please make sure you do not drive when you are taking this medication as it can alter your ability to drive safely.  If you have any concerns once you are home that you are not improving or are in fact getting worse before you can make it to your follow-up appointment, please do not hesitate to call 911 and come back for further evaluation.  Merrily Brittle, MD  Results for orders placed or performed during the hospital encounter of 08/13/17  C difficile quick scan w PCR reflex  Result Value Ref Range   C Diff antigen NEGATIVE NEGATIVE   C Diff toxin NEGATIVE NEGATIVE   C Diff interpretation No C. difficile detected.   CBC with Differential  Result Value Ref Range   WBC 8.6 3.6 - 11.0 K/uL   RBC 3.38 (L) 3.80 - 5.20 MIL/uL   Hemoglobin 11.1 (L) 12.0 - 16.0 g/dL   HCT 29.5 (L) 18.8 - 41.6 %   MCV 94.9 80.0 - 100.0 fL   MCH 33.0 26.0 - 34.0 pg   MCHC 34.8 32.0 - 36.0 g/dL   RDW 60.6 30.1 - 60.1 %   Platelets 261 150 - 440 K/uL   Neutrophils Relative % 57 %   Neutro Abs 5.0 1.4 - 6.5 K/uL   Lymphocytes Relative 23 %   Lymphs Abs 1.9 1.0 - 3.6 K/uL   Monocytes Relative 13 %   Monocytes Absolute 1.1 (H) 0.2 - 0.9 K/uL   Eosinophils Relative 6 %   Eosinophils Absolute 0.5 0 - 0.7 K/uL   Basophils Relative 1 %   Basophils  Absolute 0.1 0 - 0.1 K/uL  Comprehensive metabolic panel  Result Value Ref Range   Sodium 138 135 - 145 mmol/L   Potassium 3.7 3.5 - 5.1 mmol/L   Chloride 104 101 - 111 mmol/L   CO2 23 22 - 32 mmol/L   Glucose, Bld 177 (H) 65 - 99 mg/dL   BUN 23 (H) 6 - 20 mg/dL   Creatinine, Ser 0.93 (H) 0.44 - 1.00 mg/dL   Calcium 8.9 8.9 - 23.5 mg/dL   Total Protein 6.7 6.5 - 8.1 g/dL   Albumin 3.8 3.5 - 5.0 g/dL   AST 21 15 - 41 U/L   ALT 14 14 - 54 U/L   Alkaline Phosphatase 66 38 - 126 U/L   Total Bilirubin 0.5 0.3 - 1.2 mg/dL   GFR calc non Af Amer 26 (L) >60 mL/min   GFR calc Af Amer 30 (L) >60 mL/min   Anion gap 11 5 - 15  Troponin I  Result Value Ref Range   Troponin I <0.03 <0.03 ng/mL  Lactic acid, plasma  Result Value Ref Range   Lactic Acid, Venous 1.6 0.5 - 1.9 mmol/L  Lipase, blood  Result Value Ref Range   Lipase 24 11 -  51 U/L   Ct Abdomen Pelvis Wo Contrast  Result Date: 08/13/2017 CLINICAL DATA:  Syncopal episode at dining table. History of appendectomy, hysterectomy, colonic diverticulosis. EXAM: CT ABDOMEN AND PELVIS WITHOUT CONTRAST TECHNIQUE: Multidetector CT imaging of the abdomen and pelvis was performed following the standard protocol without IV contrast. COMPARISON:  MRI lumbar spine February 28, 2015 and CT abdomen and pelvis December 14, 2011 FINDINGS: LOWER CHEST: Dependent and lingular atelectasis. Heart size is normal. Severe coronary artery calcifications and/or stent. No pericardial effusion. HEPATOBILIARY: Normal. PANCREAS: Normal. SPLEEN: Normal. ADRENALS/URINARY TRACT: Kidneys are orthotopic, demonstrating normal size and morphology. No nephrolithiasis, hydronephrosis; limited assessment for renal masses on this nonenhanced examination. The unopacified ureters are normal in course and caliber. Urinary bladder is well distended and unremarkable. Normal adrenal glands. STOMACH/BOWEL: Very small hiatal hernia. The stomach, small and large bowel are normal in course and  caliber without inflammatory changes, sensitivity decreased by lack of enteric contrast. Layering dense enteric contents within ptotic cecum. Moderate sigmoid colonic diverticulosis. VASCULAR/LYMPHATIC: Aortoiliac vessels are normal in course and caliber. Severe calcific atherosclerosis. No lymphadenopathy by CT size criteria. REPRODUCTIVE: Status post hysterectomy. OTHER: No intraperitoneal free fluid or free air. MUSCULOSKELETAL: Non-acute. Rectus abdominis diastasis. Small fat containing umbilical hernia. Osteopenia. Grade 1 L4-5 anterolisthesis, unchanged. Stable degenerative change of lumbar spine. IMPRESSION: 1. Colonic diverticulosis without diverticulitis nor acute intra-abdominal/pelvic process. Aortic Atherosclerosis (ICD10-I70.0). Electronically Signed   By: Awilda Metro M.D.   On: 08/13/2017 03:16   Ct Head Wo Contrast  Result Date: 08/13/2017 CLINICAL DATA:  Syncopal episode while eating dinner. History of hypertension and hypercholesterolemia. EXAM: CT HEAD WITHOUT CONTRAST TECHNIQUE: Contiguous axial images were obtained from the base of the skull through the vertex without intravenous contrast. COMPARISON:  CT HEAD August 15, 2012 FINDINGS: BRAIN: No intraparenchymal hemorrhage, mass effect nor midline shift. Moderate ventriculomegaly with slight sulcal effacement at the convexities. Patchy supratentorial white matter hypodensities less than expected for patient's age, though non-specific are most compatible with chronic small vessel ischemic disease. No acute large vascular territory infarcts. No abnormal extra-axial fluid collections. Basal cisterns are patent. VASCULAR: Moderate to severe calcific atherosclerosis of the carotid siphons. SKULL: No skull fracture. No significant scalp soft tissue swelling. SINUSES/ORBITS: The mastoid air-cells and included paranasal sinuses are well-aerated.The included ocular globes and orbital contents are non-suspicious. OTHER: None. IMPRESSION: 1. No  acute intracranial process. 2. 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 Electronically Signed   By: Awilda Metro M.D.   On: 08/13/2017 03:03   Dg Chest Portable 1 View  Result Date: 08/13/2017 CLINICAL DATA:  Cough EXAM: PORTABLE CHEST 1 VIEW COMPARISON:  11/16/2014 FINDINGS: Normal heart size and pulmonary vascularity. No focal airspace disease or consolidation in the lungs. No blunting of costophrenic angles. No pneumothorax. Mediastinal contours appear intact. Calcification of the aorta IMPRESSION: No evidence of active pulmonary disease.  Aortic atherosclerosis. Electronically Signed   By: Burman Nieves M.D.   On: 08/13/2017 02:06

## 2017-11-15 ENCOUNTER — Encounter (INDEPENDENT_AMBULATORY_CARE_PROVIDER_SITE_OTHER): Payer: Self-pay

## 2017-11-15 ENCOUNTER — Ambulatory Visit (INDEPENDENT_AMBULATORY_CARE_PROVIDER_SITE_OTHER): Payer: Medicare Other | Admitting: Vascular Surgery

## 2017-11-15 ENCOUNTER — Ambulatory Visit (INDEPENDENT_AMBULATORY_CARE_PROVIDER_SITE_OTHER): Payer: Medicare Other

## 2017-11-15 DIAGNOSIS — I6523 Occlusion and stenosis of bilateral carotid arteries: Secondary | ICD-10-CM

## 2017-11-16 DIAGNOSIS — Z0000 Encounter for general adult medical examination without abnormal findings: Secondary | ICD-10-CM | POA: Insufficient documentation

## 2017-11-19 ENCOUNTER — Other Ambulatory Visit (INDEPENDENT_AMBULATORY_CARE_PROVIDER_SITE_OTHER): Payer: Self-pay | Admitting: Vascular Surgery

## 2017-11-19 ENCOUNTER — Encounter (INDEPENDENT_AMBULATORY_CARE_PROVIDER_SITE_OTHER): Payer: Self-pay | Admitting: Vascular Surgery

## 2017-11-19 DIAGNOSIS — I6523 Occlusion and stenosis of bilateral carotid arteries: Secondary | ICD-10-CM

## 2018-11-17 ENCOUNTER — Encounter (INDEPENDENT_AMBULATORY_CARE_PROVIDER_SITE_OTHER): Payer: Medicare Other

## 2018-11-17 ENCOUNTER — Ambulatory Visit (INDEPENDENT_AMBULATORY_CARE_PROVIDER_SITE_OTHER): Payer: Medicare Other | Admitting: Vascular Surgery

## 2018-12-14 DIAGNOSIS — R600 Localized edema: Secondary | ICD-10-CM | POA: Insufficient documentation

## 2019-01-23 ENCOUNTER — Encounter (INDEPENDENT_AMBULATORY_CARE_PROVIDER_SITE_OTHER): Payer: Self-pay | Admitting: Vascular Surgery

## 2019-01-23 ENCOUNTER — Ambulatory Visit (INDEPENDENT_AMBULATORY_CARE_PROVIDER_SITE_OTHER): Payer: Medicare Other | Admitting: Vascular Surgery

## 2019-01-23 ENCOUNTER — Ambulatory Visit (INDEPENDENT_AMBULATORY_CARE_PROVIDER_SITE_OTHER): Payer: Medicare Other

## 2019-01-23 ENCOUNTER — Other Ambulatory Visit: Payer: Self-pay

## 2019-01-23 VITALS — BP 171/63 | HR 67 | Resp 10 | Ht 60.5 in | Wt 133.0 lb

## 2019-01-23 DIAGNOSIS — I6523 Occlusion and stenosis of bilateral carotid arteries: Secondary | ICD-10-CM | POA: Diagnosis not present

## 2019-01-23 DIAGNOSIS — E1159 Type 2 diabetes mellitus with other circulatory complications: Secondary | ICD-10-CM

## 2019-01-23 DIAGNOSIS — I25118 Atherosclerotic heart disease of native coronary artery with other forms of angina pectoris: Secondary | ICD-10-CM

## 2019-01-23 DIAGNOSIS — I10 Essential (primary) hypertension: Secondary | ICD-10-CM | POA: Diagnosis not present

## 2019-01-23 DIAGNOSIS — E782 Mixed hyperlipidemia: Secondary | ICD-10-CM

## 2019-01-23 NOTE — Progress Notes (Signed)
MRN : [id number]  [name] is a 85 y.o. (Aug 24, 1930) female who presents with chief complaint of  Chief Complaint  Patient presents with  . Follow-up  .  History of Present Illness:   The patient is seen for follow up evaluation of carotid stenosis. The carotid stenosis followed by ultrasound.   The patient denies amaurosis fugax. There is no recent history of TIA symptoms or focal motor deficits. There is no prior documented CVA.  The patient is taking enteric-coated aspirin 81 mg daily.  There is no history of migraine headaches. There is no history of seizures.  The patient has a history of coronary artery disease, no recent episodes of angina or shortness of breath. The patient denies PAD or claudication symptoms. There is a history of hyperlipidemia which is being treated with a statin.   Current Meds  Medication Sig  . acetaminophen (TYLENOL) 500 MG tablet Take 1,000 mg by mouth every 6 (six) hours as needed. 1-2 tabs daily as needed For pain  . AMLODIPINE BESYLATE PO Take 5 mg by mouth 2 (two) times daily.  Marland Kitchen aspirin EC 81 MG tablet Take 81 mg by mouth daily.  . cloNIDine (CATAPRES - DOSED IN MG/24 HR) 0.2 mg/24hr patch Place 0.2 mg onto the skin once a week.  . clopidogrel (PLAVIX) 75 MG tablet Take 75 mg by mouth daily.  Marland Kitchen gabapentin (NEURONTIN) 100 MG capsule Take 4 capsules by mouth daily.  Marland Kitchen glimepiride (AMARYL) 1 MG tablet Take by mouth.  . isosorbide mononitrate (IMDUR) 60 MG 24 hr tablet Take 60 mg by mouth daily.  . magnesium oxide (MAG-OX) 400 MG tablet Take by mouth.  . nitroGLYCERIN (NITROSTAT) 0.4 MG SL tablet Place 0.4 mg under the tongue every 5 (five) minutes as needed. For chest pain  . omeprazole (PRILOSEC) 40 MG capsule   . rosuvastatin (CRESTOR) 20 MG tablet Take 20 mg by mouth daily.  Marland Kitchen senna-docusate (SENOKOT-S) 8.6-50 MG per tablet Take 1 tablet by mouth 2 (two) times daily.  Marland Kitchen spironolactone (ALDACTONE) 25 MG tablet Take 25 mg by mouth  daily.  . timolol (BETIMOL) 0.5 % ophthalmic solution Place 1 drop into both eyes every evening.  . torsemide (DEMADEX) 10 MG tablet Take 10 mg by mouth every evening.     Past Medical History:  Diagnosis Date  . Hypercholesteremia   . Hypertension   . Myocardial infarction (HCC) 2008  . S/P angioplasty with stent     Past Surgical History:  Procedure Laterality Date  . ABDOMINAL HYSTERECTOMY    . APPENDECTOMY    . CORONARY ANGIOPLASTY WITH STENT PLACEMENT      Social History Social History   Tobacco Use  . Smoking status: Never Smoker  . Smokeless tobacco: Never Used  Substance Use Topics  . Alcohol use: No  . Drug use: No    Family History Family History  Problem Relation Age of Onset  . Breast cancer Maternal Aunt     Allergies  Allergen Reactions  . Butenafine Swelling    Tongue swelled and turned red  . Levofloxacin     Other reaction(s): Other (See Comments) Tongue swelled and turned red  . Limonene Other (See Comments)    Other reaction(s): Other (See Comments) Tongue swelled and turned red Tongue swelled and turned red   . Terbinafine And Related Other (See Comments)    Tongue swelled and turned red     REVIEW OF SYSTEMS (Negative unless checked)  Constitutional: []   Weight loss  [] Fever  [] Chills Cardiac: [] Chest pain   [] Chest pressure   [] Palpitations   [] Shortness of breath when laying flat   [] Shortness of breath with exertion. Vascular:  [] Pain in legs with walking   [] Pain in legs at rest  [] History of DVT   [] Phlebitis   [] Swelling in legs   [] Varicose veins   [] Non-healing ulcers Pulmonary:   [] Uses home oxygen   [] Productive cough   [] Hemoptysis   [] Wheeze  [] COPD   [] Asthma Neurologic:  [] Dizziness   [] Seizures   [] History of stroke   [] History of TIA  [] Aphasia   [] Vissual changes   [] Weakness or numbness in arm   [] Weakness or numbness in leg Musculoskeletal:   [] Joint swelling   [] Joint pain   [] Low back pain Hematologic:  [] Easy  bruising  [] Easy bleeding   [] Hypercoagulable state   [] Anemic Gastrointestinal:  [] Diarrhea   [] Vomiting  [] Gastroesophageal reflux/heartburn   [] Difficulty swallowing. Genitourinary:  [] Chronic kidney disease   [] Difficult urination  [] Frequent urination   [] Blood in urine Skin:  [] Rashes   [] Ulcers  Psychological:  [] History of anxiety   []  History of major depression.  Physical Examination  Vitals:   01/23/19 1057  BP: (!) 171/63  Pulse: 67  Resp: 10  Weight: 133 lb (60.3 kg)  Height: 5' 0.5" (1.537 m)   Body mass index is 25.55 kg/m. Gen: WD/WN, NAD Head: [place]/AT, No temporalis wasting.  Ear/Nose/Throat: Hearing grossly intact, nares w/o erythema or drainage Eyes: PER, EOMI, sclera nonicteric.  Neck: Supple, no large masses.   Pulmonary:  Good air movement, no audible wheezing bilaterally, no use of accessory muscles.  Cardiac: RRR, no JVD Vascular:  Bilateral carotid bruit Vessel Right Left  Radial Palpable Palpable  Brachial Palpable Palpable  Carotid Palpable Palpable  Gastrointestinal: Non-distended. No guarding/no peritoneal signs.  Musculoskeletal: M/S 5/5 throughout.  No deformity or atrophy.  Neurologic: CN 2-12 intact. Symmetrical.  Speech is fluent. Motor exam as listed above. Psychiatric: Judgment intact, Mood & affect appropriate for pt's clinical situation. Dermatologic: No rashes or ulcers noted.  No changes consistent with cellulitis. Lymph : No lichenification or skin changes of chronic lymphedema.  CBC Lab Results  Component Value Date   WBC 8.6 08/13/2017   HGB 11.1 (L) 08/13/2017   HCT 32.0 (L) 08/13/2017   MCV 94.9 08/13/2017   PLT 261 08/13/2017    BMET    Component Value Date/Time   NA 138 08/13/2017 0055   NA 139 08/16/2012 0255   K 3.7 08/13/2017 0055   K 3.9 08/16/2012 0255   CL 104 08/13/2017 0055   CL 108 (H) 08/16/2012 0255   CO2 23 08/13/2017 0055   CO2 25 08/16/2012 0255   GLUCOSE 177 (H) 08/13/2017 0055   GLUCOSE 129 (H)  08/16/2012 0255   BUN 23 (H) 08/13/2017 0055   BUN 21 (H) 08/16/2012 0255   CREATININE 1.71 (H) 08/13/2017 0055   CREATININE 1.43 (H) 08/16/2012 0255   CALCIUM 8.9 08/13/2017 0055   CALCIUM 8.5 08/16/2012 0255   GFRNONAA 26 (L) 08/13/2017 0055   GFRNONAA 35 (L) 08/16/2012 0255   GFRAA 30 (L) 08/13/2017 0055   GFRAA 40 (L) 08/16/2012 0255   CrCl cannot be calculated (Patient's most recent lab result is older than the maximum 21 days allowed.).  COAG Lab Results  Component Value Date   INR 0.9 08/15/2012    Radiology No results found.  Assessment/Plan 1. Bilateral carotid artery stenosis Recommend:  Given  the patient's asymptomatic subcritical stenosis no further invasive testing or surgery at this time.  Duplex ultrasound shows <60% stenosis bilaterally.  Continue antiplatelet therapy as prescribed Continue management of CAD, HTN and Hyperlipidemia Healthy heart diet,  encouraged exercise at least 4 times per week Follow up in 12 months with duplex ultrasound and physical exam  - VAS US CAROTID; Future  2. Essential hypertension Continue antihypertensive medications as already ordered, these medications have been reviewed and there are no changes at this time.   3. Coronary artery disease of native artery of native heart with stable angina pectoris (HCC) Continue cardiac and antihypertensive medications as already ordered and reviewed, no changes at this time.  Continue statin as ordered and reviewed, no changes at this time  Nitrates PRN for chest pain   4. Type 2 diabetes mellitus with other circulatory complication, unspecified whether long term insulin use (HCC) Continue hypoglycemic medications as already ordered, these medications have been reviewed and there are no changes at this time.  Hgb A1C to be monitored as already arranged by primary service   5. Mixed hyperlipidemia Continue statin as ordered and reviewed, no changes at this time     Hortencia Pilar, MD  01/23/2019 11:08 AM

## 2019-03-15 ENCOUNTER — Other Ambulatory Visit: Payer: Self-pay

## 2019-03-15 ENCOUNTER — Emergency Department
Admission: EM | Admit: 2019-03-15 | Discharge: 2019-03-15 | Disposition: A | Discharge status: Home / Self Care | Payer: Medicare Other | Attending: Emergency Medicine | Admitting: Emergency Medicine

## 2019-03-15 ENCOUNTER — Emergency Department: Payer: Medicare Other

## 2019-03-15 ENCOUNTER — Encounter: Payer: Self-pay | Admitting: Emergency Medicine

## 2019-03-15 DIAGNOSIS — Z7982 Long term (current) use of aspirin: Secondary | ICD-10-CM | POA: Insufficient documentation

## 2019-03-15 DIAGNOSIS — Z79899 Other long term (current) drug therapy: Secondary | ICD-10-CM | POA: Diagnosis not present

## 2019-03-15 DIAGNOSIS — E119 Type 2 diabetes mellitus without complications: Secondary | ICD-10-CM | POA: Insufficient documentation

## 2019-03-15 DIAGNOSIS — Z7902 Long term (current) use of antithrombotics/antiplatelets: Secondary | ICD-10-CM | POA: Insufficient documentation

## 2019-03-15 DIAGNOSIS — M545 Low back pain: Secondary | ICD-10-CM | POA: Diagnosis not present

## 2019-03-15 DIAGNOSIS — R079 Chest pain, unspecified: Secondary | ICD-10-CM | POA: Diagnosis not present

## 2019-03-15 DIAGNOSIS — I129 Hypertensive chronic kidney disease with stage 1 through stage 4 chronic kidney disease, or unspecified chronic kidney disease: Secondary | ICD-10-CM | POA: Insufficient documentation

## 2019-03-15 DIAGNOSIS — M79642 Pain in left hand: Secondary | ICD-10-CM | POA: Diagnosis not present

## 2019-03-15 DIAGNOSIS — N183 Chronic kidney disease, stage 3 unspecified: Secondary | ICD-10-CM | POA: Diagnosis not present

## 2019-03-15 DIAGNOSIS — R519 Headache, unspecified: Secondary | ICD-10-CM | POA: Diagnosis not present

## 2019-03-15 DIAGNOSIS — W19XXXA Unspecified fall, initial encounter: Secondary | ICD-10-CM

## 2019-03-15 LAB — COMPREHENSIVE METABOLIC PANEL
ALT: 18 U/L (ref 0–44)
AST: 19 U/L (ref 15–41)
Albumin: 4.3 g/dL (ref 3.5–5.0)
Alkaline Phosphatase: 62 U/L (ref 38–126)
Anion gap: 12 (ref 5–15)
BUN: 28 mg/dL — ABNORMAL HIGH (ref 8–23)
CO2: 27 mmol/L (ref 22–32)
Calcium: 9.9 mg/dL (ref 8.9–10.3)
Chloride: 101 mmol/L (ref 98–111)
Creatinine, Ser: 1.33 mg/dL — ABNORMAL HIGH (ref 0.44–1.00)
GFR calc Af Amer: 42 mL/min — ABNORMAL LOW (ref >60)
GFR calc non Af Amer: 36 mL/min — ABNORMAL LOW (ref >60)
Glucose, Bld: 117 mg/dL — ABNORMAL HIGH (ref 70–99)
Potassium: 3.9 mmol/L (ref 3.5–5.1)
Sodium: 140 mmol/L (ref 135–145)
Total Bilirubin: 0.6 mg/dL (ref 0.3–1.2)
Total Protein: 7.6 g/dL (ref 6.5–8.1)

## 2019-03-15 LAB — CBC WITH DIFFERENTIAL/PLATELET
Abs Immature Granulocytes: 0.09 10*3/uL — ABNORMAL HIGH (ref 0.00–0.07)
Basophils Absolute: 0.1 10*3/uL (ref 0.0–0.1)
Basophils Relative: 1 %
Eosinophils Absolute: 0.4 10*3/uL (ref 0.0–0.5)
Eosinophils Relative: 4 %
HCT: 37.5 % (ref 36.0–46.0)
Hemoglobin: 12.8 g/dL (ref 12.0–15.0)
Immature Granulocytes: 1 %
Lymphocytes Relative: 21 %
Lymphs Abs: 1.6 10*3/uL (ref 0.7–4.0)
MCH: 32.5 pg (ref 26.0–34.0)
MCHC: 34.1 g/dL (ref 30.0–36.0)
MCV: 95.2 fL (ref 80.0–100.0)
Monocytes Absolute: 0.6 10*3/uL (ref 0.1–1.0)
Monocytes Relative: 7 %
Neutro Abs: 5.2 10*3/uL (ref 1.7–7.7)
Neutrophils Relative %: 66 %
Platelets: 320 10*3/uL (ref 150–400)
RBC: 3.94 MIL/uL (ref 3.87–5.11)
RDW: 13.4 % (ref 11.5–15.5)
WBC: 7.9 10*3/uL (ref 4.0–10.5)
nRBC: 0 % (ref 0.0–0.2)

## 2019-03-15 LAB — TROPONIN I (HIGH SENSITIVITY): Troponin I (High Sensitivity): 9 ng/L (ref <18)

## 2019-03-15 MED ORDER — TRAMADOL HCL 50 MG PO TABS: 50.0000 mg | ORAL_TABLET | Freq: Four times a day (QID) | ORAL | 0 refills | Status: AC | PRN

## 2019-03-15 MED ORDER — TRAMADOL HCL 50 MG PO TABS
50.0000 mg | ORAL_TABLET | Freq: Once | ORAL | Status: DC
  Filled 2019-03-15: qty 1

## 2019-03-15 NOTE — ED Triage Notes (Signed)
Presents from home via EMS   States she was getting up[ from commode  Slipped  [place]  Hit head on door  Did have headache at time  States now she is having lower back pain  Also has pain and swelling to left 5 th finger
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
## 2019-03-15 NOTE — ED Notes (Signed)
Pt unable to provide UA at this time. Pt made aware UA is needed. Call bell at bedside within arms reach.

## 2019-03-30 ENCOUNTER — Emergency Department: Payer: Medicare Other

## 2019-03-30 ENCOUNTER — Encounter: Payer: Self-pay | Admitting: Emergency Medicine

## 2019-03-30 ENCOUNTER — Other Ambulatory Visit: Payer: Self-pay

## 2019-03-30 ENCOUNTER — Observation Stay
Admission: EM | Admit: 2019-03-30 | Discharge: 2019-03-31 | Disposition: A | Discharge status: Home / Self Care | Payer: Medicare Other | Attending: Internal Medicine | Admitting: Internal Medicine

## 2019-03-30 DIAGNOSIS — Z7982 Long term (current) use of aspirin: Secondary | ICD-10-CM | POA: Diagnosis not present

## 2019-03-30 DIAGNOSIS — Z955 Presence of coronary angioplasty implant and graft: Secondary | ICD-10-CM | POA: Insufficient documentation

## 2019-03-30 DIAGNOSIS — K219 Gastro-esophageal reflux disease without esophagitis: Secondary | ICD-10-CM | POA: Diagnosis not present

## 2019-03-30 DIAGNOSIS — Z881 Allergy status to other antibiotic agents status: Secondary | ICD-10-CM | POA: Diagnosis not present

## 2019-03-30 DIAGNOSIS — G629 Polyneuropathy, unspecified: Secondary | ICD-10-CM

## 2019-03-30 DIAGNOSIS — E1140 Type 2 diabetes mellitus with diabetic neuropathy, unspecified: Secondary | ICD-10-CM | POA: Diagnosis not present

## 2019-03-30 DIAGNOSIS — E119 Type 2 diabetes mellitus without complications: Secondary | ICD-10-CM

## 2019-03-30 DIAGNOSIS — I10 Essential (primary) hypertension: Secondary | ICD-10-CM | POA: Diagnosis not present

## 2019-03-30 DIAGNOSIS — I493 Ventricular premature depolarization: Secondary | ICD-10-CM | POA: Diagnosis not present

## 2019-03-30 DIAGNOSIS — R55 Syncope and collapse: Secondary | ICD-10-CM | POA: Diagnosis present

## 2019-03-30 DIAGNOSIS — Z79899 Other long term (current) drug therapy: Secondary | ICD-10-CM | POA: Diagnosis not present

## 2019-03-30 DIAGNOSIS — E1122 Type 2 diabetes mellitus with diabetic chronic kidney disease: Secondary | ICD-10-CM | POA: Insufficient documentation

## 2019-03-30 DIAGNOSIS — W1830XA Fall on same level, unspecified, initial encounter: Secondary | ICD-10-CM | POA: Insufficient documentation

## 2019-03-30 DIAGNOSIS — N183 Chronic kidney disease, stage 3 unspecified: Secondary | ICD-10-CM | POA: Diagnosis not present

## 2019-03-30 DIAGNOSIS — E7800 Pure hypercholesterolemia, unspecified: Secondary | ICD-10-CM | POA: Diagnosis not present

## 2019-03-30 DIAGNOSIS — M48062 Spinal stenosis, lumbar region with neurogenic claudication: Secondary | ICD-10-CM | POA: Diagnosis not present

## 2019-03-30 DIAGNOSIS — R079 Chest pain, unspecified: Secondary | ICD-10-CM | POA: Insufficient documentation

## 2019-03-30 DIAGNOSIS — S72112A Displaced fracture of greater trochanter of left femur, initial encounter for closed fracture: Secondary | ICD-10-CM | POA: Diagnosis not present

## 2019-03-30 DIAGNOSIS — S7002XA Contusion of left hip, initial encounter: Secondary | ICD-10-CM | POA: Insufficient documentation

## 2019-03-30 DIAGNOSIS — I2510 Atherosclerotic heart disease of native coronary artery without angina pectoris: Secondary | ICD-10-CM | POA: Diagnosis present

## 2019-03-30 DIAGNOSIS — Z20828 Contact with and (suspected) exposure to other viral communicable diseases: Secondary | ICD-10-CM | POA: Insufficient documentation

## 2019-03-30 DIAGNOSIS — M25552 Pain in left hip: Secondary | ICD-10-CM | POA: Diagnosis present

## 2019-03-30 DIAGNOSIS — I129 Hypertensive chronic kidney disease with stage 1 through stage 4 chronic kidney disease, or unspecified chronic kidney disease: Secondary | ICD-10-CM | POA: Insufficient documentation

## 2019-03-30 DIAGNOSIS — I252 Old myocardial infarction: Secondary | ICD-10-CM | POA: Diagnosis not present

## 2019-03-30 DIAGNOSIS — S72115A Nondisplaced fracture of greater trochanter of left femur, initial encounter for closed fracture: Secondary | ICD-10-CM

## 2019-03-30 DIAGNOSIS — Y92010 Kitchen of single-family (private) house as the place of occurrence of the external cause: Secondary | ICD-10-CM | POA: Insufficient documentation

## 2019-03-30 DIAGNOSIS — S20219A Contusion of unspecified front wall of thorax, initial encounter: Secondary | ICD-10-CM | POA: Diagnosis not present

## 2019-03-30 DIAGNOSIS — Z7984 Long term (current) use of oral hypoglycemic drugs: Secondary | ICD-10-CM | POA: Diagnosis not present

## 2019-03-30 DIAGNOSIS — S0083XA Contusion of other part of head, initial encounter: Secondary | ICD-10-CM | POA: Diagnosis not present

## 2019-03-30 DIAGNOSIS — W19XXXA Unspecified fall, initial encounter: Secondary | ICD-10-CM

## 2019-03-30 DIAGNOSIS — M48061 Spinal stenosis, lumbar region without neurogenic claudication: Secondary | ICD-10-CM | POA: Diagnosis present

## 2019-03-30 LAB — CBC
HCT: 37.8 % (ref 36.0–46.0)
Hemoglobin: 12.9 g/dL (ref 12.0–15.0)
MCH: 31.9 pg (ref 26.0–34.0)
MCHC: 34.1 g/dL (ref 30.0–36.0)
MCV: 93.6 fL (ref 80.0–100.0)
Platelets: 396 10*3/uL (ref 150–400)
RBC: 4.04 MIL/uL (ref 3.87–5.11)
RDW: 13.3 % (ref 11.5–15.5)
WBC: 11.5 10*3/uL — ABNORMAL HIGH (ref 4.0–10.5)
nRBC: 0 % (ref 0.0–0.2)

## 2019-03-30 LAB — BASIC METABOLIC PANEL
Anion gap: 10 (ref 5–15)
BUN: 26 mg/dL — ABNORMAL HIGH (ref 8–23)
CO2: 29 mmol/L (ref 22–32)
Calcium: 9.8 mg/dL (ref 8.9–10.3)
Chloride: 101 mmol/L (ref 98–111)
Creatinine, Ser: 1.39 mg/dL — ABNORMAL HIGH (ref 0.44–1.00)
GFR calc Af Amer: 40 mL/min — ABNORMAL LOW (ref >60)
GFR calc non Af Amer: 34 mL/min — ABNORMAL LOW (ref >60)
Glucose, Bld: 99 mg/dL (ref 70–99)
Potassium: 3.9 mmol/L (ref 3.5–5.1)
Sodium: 140 mmol/L (ref 135–145)

## 2019-03-30 LAB — URINALYSIS, COMPLETE (UACMP) WITH MICROSCOPIC
Bacteria, UA: NONE SEEN
Bilirubin Urine: NEGATIVE
Glucose, UA: NEGATIVE mg/dL
Hgb urine dipstick: NEGATIVE
Ketones, ur: NEGATIVE mg/dL
Nitrite: NEGATIVE
Protein, ur: NEGATIVE mg/dL
Specific Gravity, Urine: 1.006 (ref 1.005–1.030)
Squamous Epithelial / HPF: NONE SEEN (ref 0–5)
pH: 7 (ref 5.0–8.0)

## 2019-03-30 LAB — GLUCOSE, CAPILLARY
Glucose-Capillary: 96 mg/dL (ref 70–99)
Glucose-Capillary: 92 mg/dL (ref 70–99)

## 2019-03-30 MED ORDER — MAGNESIUM OXIDE 400 (241.3 MG) MG PO TABS
400.0000 mg | ORAL_TABLET | Freq: Every day | ORAL | Status: DC
  Administered 2019-03-31: 400 mg via ORAL
  Filled 2019-03-30: qty 1

## 2019-03-30 MED ORDER — SODIUM CHLORIDE 0.9% FLUSH
3.0000 mL | Freq: Once | INTRAVENOUS | Status: AC
  Administered 2019-03-30: 3 mL via INTRAVENOUS

## 2019-03-30 MED ORDER — TIZANIDINE HCL 2 MG PO TABS
2.0000 mg | ORAL_TABLET | Freq: Three times a day (TID) | ORAL | Status: DC | PRN
  Filled 2019-03-30: qty 1

## 2019-03-30 MED ORDER — CLONIDINE HCL 0.2 MG/24HR TD PTWK
0.2000 mg | MEDICATED_PATCH | TRANSDERMAL | Status: DC
  Filled 2019-03-30: qty 1

## 2019-03-30 MED ORDER — ENOXAPARIN SODIUM 40 MG/0.4ML ~~LOC~~ SOLN: 40.0000 mg | SUBCUTANEOUS | Status: DC

## 2019-03-30 MED ORDER — TORSEMIDE 10 MG PO TABS
10.0000 mg | ORAL_TABLET | Freq: Every evening | ORAL | Status: DC
  Filled 2019-03-30: qty 1

## 2019-03-30 MED ORDER — OXYCODONE HCL 5 MG PO TABS: 5.0000 mg | ORAL_TABLET | ORAL | Status: DC | PRN

## 2019-03-30 MED ORDER — NITROGLYCERIN 0.4 MG SL SUBL: 0.4000 mg | SUBLINGUAL_TABLET | SUBLINGUAL | Status: DC | PRN

## 2019-03-30 MED ORDER — ISOSORBIDE MONONITRATE ER 60 MG PO TB24
60.0000 mg | ORAL_TABLET | Freq: Every day | ORAL | Status: DC
  Administered 2019-03-31: 60 mg via ORAL
  Filled 2019-03-30: qty 1

## 2019-03-30 MED ORDER — LACTATED RINGERS IV BOLUS
1000.0000 mL | Freq: Once | INTRAVENOUS | Status: AC
  Administered 2019-03-30: 1000 mL via INTRAVENOUS

## 2019-03-30 MED ORDER — ROSUVASTATIN CALCIUM 20 MG PO TABS
20.0000 mg | ORAL_TABLET | Freq: Every day | ORAL | Status: DC
  Administered 2019-03-31: 20 mg via ORAL
  Filled 2019-03-30: qty 1
  Filled 2019-03-30: qty 2

## 2019-03-30 MED ORDER — PANTOPRAZOLE SODIUM 40 MG PO TBEC
40.0000 mg | DELAYED_RELEASE_TABLET | Freq: Every day | ORAL | Status: DC
  Administered 2019-03-31: 40 mg via ORAL
  Filled 2019-03-30: qty 1

## 2019-03-30 MED ORDER — FENTANYL CITRATE (PF) 100 MCG/2ML IJ SOLN
25.0000 ug | Freq: Once | INTRAMUSCULAR | Status: AC
  Administered 2019-03-30: 25 ug via INTRAVENOUS
  Filled 2019-03-30: qty 2

## 2019-03-30 MED ORDER — AMLODIPINE BESYLATE 5 MG PO TABS
5.0000 mg | ORAL_TABLET | Freq: Two times a day (BID) | ORAL | Status: DC
  Administered 2019-03-30 – 2019-03-31 (×2): 5 mg via ORAL
  Filled 2019-03-30 (×2): qty 1

## 2019-03-30 MED ORDER — INSULIN ASPART 100 UNIT/ML ~~LOC~~ SOLN: 0.0000 [IU] | Freq: Three times a day (TID) | SUBCUTANEOUS | Status: DC

## 2019-03-30 MED ORDER — TIMOLOL MALEATE 0.5 % OP SOLN
1.0000 [drp] | Freq: Every evening | OPHTHALMIC | Status: DC
  Filled 2019-03-30: qty 5

## 2019-03-30 MED ORDER — ACETAMINOPHEN 500 MG PO TABS
1000.0000 mg | ORAL_TABLET | Freq: Once | ORAL | Status: AC
  Administered 2019-03-30: 1000 mg via ORAL
  Filled 2019-03-30: qty 2

## 2019-03-30 MED ORDER — MORPHINE SULFATE (PF) 2 MG/ML IV SOLN: 0.5000 mg | INTRAVENOUS | Status: DC | PRN

## 2019-03-30 MED ORDER — CLOPIDOGREL BISULFATE 75 MG PO TABS
75.0000 mg | ORAL_TABLET | Freq: Every day | ORAL | Status: DC
  Administered 2019-03-31: 75 mg via ORAL
  Filled 2019-03-30: qty 1

## 2019-03-30 MED ORDER — INSULIN ASPART 100 UNIT/ML ~~LOC~~ SOLN: 0.0000 [IU] | Freq: Every day | SUBCUTANEOUS | Status: DC

## 2019-03-30 MED ORDER — SPIRONOLACTONE 25 MG PO TABS
25.0000 mg | ORAL_TABLET | Freq: Every day | ORAL | Status: DC
  Administered 2019-03-31: 25 mg via ORAL
  Filled 2019-03-30: qty 1

## 2019-03-30 MED ORDER — POLYETHYLENE GLYCOL 3350 17 G PO PACK: 17.0000 g | PACK | Freq: Every day | ORAL | Status: DC | PRN

## 2019-03-30 MED ORDER — ACETAMINOPHEN 500 MG PO TABS
1000.0000 mg | ORAL_TABLET | Freq: Four times a day (QID) | ORAL | Status: DC | PRN
  Administered 2019-03-31 (×2): 1000 mg via ORAL
  Filled 2019-03-30 (×2): qty 2

## 2019-03-30 MED ORDER — GABAPENTIN 400 MG PO CAPS
400.0000 mg | ORAL_CAPSULE | Freq: Every day | ORAL | Status: DC
  Administered 2019-03-31: 400 mg via ORAL
  Filled 2019-03-30: qty 1

## 2019-03-30 MED ORDER — SENNOSIDES-DOCUSATE SODIUM 8.6-50 MG PO TABS
1.0000 | ORAL_TABLET | Freq: Two times a day (BID) | ORAL | Status: DC
  Administered 2019-03-30: 1 via ORAL
  Filled 2019-03-30 (×2): qty 1

## 2019-03-30 NOTE — ED Notes (Signed)
Pt given water 

## 2019-03-30 NOTE — ED Notes (Signed)
ED TO INPATIENT HANDOFF REPORT  ED Nurse Name and Phone #: Bas[place] Levels Name/Age/Gender [name] 85 y.o. female Room/Bed: ED16A/ED16A  Code Status   Code Status: Full Code  Home/SNF/Other Home Patient oriented to: self, place, time and situation Is this baseline? Yes   Triage Complete: Triage complete  Chief Complaint Greater trochanter fracture ([place]) [S72.113A] Closed avulsion fracture of greater trochanter of left femur ([place]) [S72.112A]  Triage Note Pt via ems from home after slip and fall. Fall was witnessed by son. Pt denies dizziness or LOC. Pt c/o pain to her left hip, knee, ribcage. Pt also has hematoma to left temple area. Pt alert & oriented, nad noted.     Allergies Allergies  Allergen Reactions  . Butenafine Swelling    Tongue swelled and turned red  . Levofloxacin     Other reaction(s): Other (See Comments) Tongue swelled and turned red  . Limonene Other (See Comments)    Other reaction(s): Other (See Comments) Tongue swelled and turned red Tongue swelled and turned red   . Terbinafine And Related Other (See Comments)    Tongue swelled and turned red    Level of Care/Admitting Diagnosis ED Disposition    ED Disposition Condition [place]: [place] [[id number]]  Level of Care: Telemetry [5]  Covid Evaluation: Asymptomatic Screening Protocol (No Symptoms)  Diagnosis: Closed avulsion fracture of greater trochanter of left femur [facility]) [[id number]]  Admitting Physician: Louellen Molder [phone]  Attending Physician: Rebecca Eaton       B Medical/Surgery History Past Medical History:  Diagnosis Date  . Hypercholesteremia   . Hypertension   . Myocardial infarction ([place]) 2008  . S/P angioplasty with stent    Past Surgical History:  Procedure Laterality Date  . ABDOMINAL HYSTERECTOMY    . APPENDECTOMY    . CORONARY ANGIOPLASTY WITH STENT PLACEMENT       A IV Location/Drains/Wounds Patient  Lines/Drains/Airways Status   Active Line/Drains/Airways    Name:   Placement date:   Placement time:   Site:   Days:   Peripheral IV 03/30/19 Right Antecubital   03/30/19    1415    Antecubital   less than 1          Intake/Output Last 24 hours  Intake/Output Summary (Last 24 hours) at 03/30/2019 1902 Last data filed at 03/30/2019 1835 Gross per 24 hour  Intake 1003 ml  Output --  Net 1003 ml    Labs/Imaging Results for orders placed or performed during the hospital encounter of 03/30/19 (from the past 48 hour(s))  Basic metabolic panel     Status: Abnormal   Collection Time: 03/30/19  2:21 PM  Result Value Ref Range   Sodium 140 135 - 145 mmol/L   Potassium 3.9 3.5 - 5.1 mmol/L   Chloride 101 98 - 111 mmol/L   CO2 29 22 - 32 mmol/L   Glucose, Bld 99 70 - 99 mg/dL   BUN 26 (H) 8 - 23 mg/dL   Creatinine, Ser 1.39 (H) 0.44 - 1.00 mg/dL   Calcium 9.8 8.9 - 10.3 mg/dL   GFR calc non Af Amer 34 (L) >60 mL/min   GFR calc Af Amer 40 (L) >60 mL/min   Anion gap 10 5 - 15    Comment: Performed at [facility], [address] [place], [place] [address]  CBC     Status: Abnormal   Collection Time: 03/30/19  2:21 PM  Result Value Ref Range   WBC 11.5 (H) 4.0 - 10.5 K/uL   RBC 4.04 3.87 - 5.11 MIL/uL   Hemoglobin 12.9 12.0 - 15.0 g/dL   HCT 17.6 16.0 - 73.7 %   MCV 93.6 80.0 - 100.0 fL   MCH 31.9 26.0 - 34.0 pg   MCHC 34.1 30.0 - 36.0 g/dL   RDW 10.6 26.9 - 48.5 %   Platelets 396 150 - 400 K/uL   nRBC 0.0 0.0 - 0.2 %    Comment: Performed at [facility], [address] [place], [place] [address]  Urinalysis, Complete w Microscopic     Status: Abnormal   Collection Time: 03/30/19  2:21 PM  Result Value Ref Range   Color, Urine COLORLESS (A) YELLOW   APPearance CLEAR (A) CLEAR   Specific Gravity, Urine 1.006 1.005 - 1.030   pH 7.0 5.0 - 8.0   Glucose, UA NEGATIVE NEGATIVE mg/dL   Hgb urine dipstick NEGATIVE NEGATIVE   Bilirubin Urine NEGATIVE  NEGATIVE   Ketones, ur NEGATIVE NEGATIVE mg/dL   Protein, ur NEGATIVE NEGATIVE mg/dL   Nitrite NEGATIVE NEGATIVE   Leukocytes,Ua TRACE (A) NEGATIVE   RBC / HPF 0-5 0 - 5 RBC/hpf   WBC, UA 6-10 0 - 5 WBC/hpf   Bacteria, UA NONE SEEN NONE SEEN   Squamous Epithelial / LPF NONE SEEN 0 - 5   Hyaline Casts, UA PRESENT     Comment: Performed at [facility], [address] [place], [place] [address]  Glucose, capillary     Status: None   Collection Time: 03/30/19  5:05 PM  Result Value Ref Range   Glucose-Capillary 96 70 - 99 mg/dL   CT Head Wo Contrast  Result Date: 03/30/2019 CLINICAL DATA:  Head injury after fall. No loss of consciousness. EXAM: CT HEAD WITHOUT CONTRAST CT CERVICAL SPINE WITHOUT CONTRAST TECHNIQUE: Multidetector CT imaging of the head and cervical spine was performed following the standard protocol without intravenous contrast. Multiplanar CT image reconstructions of the cervical spine were also generated. COMPARISON:  March 15, 2019. FINDINGS: CT HEAD FINDINGS Brain: Mild diffuse cortical atrophy is noted. Mild chronic ischemic white matter disease is noted. No mass effect or midline shift is noted. Ventricular size is within normal limits. There is no evidence of mass lesion, hemorrhage or acute infarction. Vascular: No hyperdense vessel or unexpected calcification. Skull: Normal. Negative for fracture or focal lesion. Sinuses/Orbits: No acute finding. Other: None. CT CERVICAL SPINE FINDINGS Alignment: Mild grade 1 anterolisthesis of C4-5 is noted secondary to posterior facet joint hypertrophy. Skull base and vertebrae: No acute fracture. No primary bone lesion or focal pathologic process. Soft tissues and spinal canal: No prevertebral fluid or swelling. No visible canal hematoma. Disc levels: Severe degenerative disc disease is noted at C3-4, C5-6 and C6-7 with anterior osteophyte formation. Upper chest: Negative. Other: Severe hypertrophy of the right-sided posterior  facet joints is noted at C2-3 and C3-4. IMPRESSION: 1. Mild diffuse cortical atrophy. Mild chronic ischemic white matter disease. No acute intracranial abnormality seen. 2. Severe multilevel degenerative disc disease. No acute abnormality seen in the cervical spine. Electronically Signed   By: Lupita Raider M.D.   On: 03/30/2019 14:53   CT Chest Wo Contrast  Result Date: 03/30/2019 CLINICAL DATA:  Chest pain after fall EXAM: CT CHEST WITHOUT CONTRAST TECHNIQUE: Multidetector CT imaging of the chest was performed following the standard protocol without IV contrast. COMPARISON:  CT 10/25/2006 FINDINGS: Cardiovascular: Heart size is normal.  No pericardial effusion. Thoracic aorta is nonaneurysmal. There are atherosclerotic calcifications of the aorta and coronary arteries. Main pulmonary trunk is nondilated. Mediastinum/Nodes: No enlarged mediastinal or axillary lymph nodes. Thyroid gland, trachea, and esophagus demonstrate no significant findings. Lungs/Pleura: Lungs are clear. No pleural effusion or pneumothorax. Upper Abdomen: No acute abnormality. Musculoskeletal: Subtle superior endplate compression deformities of T2 and T3 with less than 25% height loss, which appear acute to subacute. No bony retropulsion. No displaced rib fracture. IMPRESSION: 1. Subtle superior endplate compression deformities of T2 and T3, which appear acute to subacute. 2. Otherwise, no acute findings within the chest. 3. Coronary artery and aortic atherosclerosis. Aortic Atherosclerosis (ICD10-I70.0). Electronically Signed   By: Duanne GuessNicholas  Plundo M.D.   On: 03/30/2019 14:57   CT Cervical Spine Wo Contrast  Result Date: 03/30/2019 CLINICAL DATA:  Head injury after fall. No loss of consciousness. EXAM: CT HEAD WITHOUT CONTRAST CT CERVICAL SPINE WITHOUT CONTRAST TECHNIQUE: Multidetector CT imaging of the head and cervical spine was performed following the standard protocol without intravenous contrast. Multiplanar CT image  reconstructions of the cervical spine were also generated. COMPARISON:  March 15, 2019. FINDINGS: CT HEAD FINDINGS Brain: Mild diffuse cortical atrophy is noted. Mild chronic ischemic white matter disease is noted. No mass effect or midline shift is noted. Ventricular size is within normal limits. There is no evidence of mass lesion, hemorrhage or acute infarction. Vascular: No hyperdense vessel or unexpected calcification. Skull: Normal. Negative for fracture or focal lesion. Sinuses/Orbits: No acute finding. Other: None. CT CERVICAL SPINE FINDINGS Alignment: Mild grade 1 anterolisthesis of C4-5 is noted secondary to posterior facet joint hypertrophy. Skull base and vertebrae: No acute fracture. No primary bone lesion or focal pathologic process. Soft tissues and spinal canal: No prevertebral fluid or swelling. No visible canal hematoma. Disc levels: Severe degenerative disc disease is noted at C3-4, C5-6 and C6-7 with anterior osteophyte formation. Upper chest: Negative. Other: Severe hypertrophy of the right-sided posterior facet joints is noted at C2-3 and C3-4. IMPRESSION: 1. Mild diffuse cortical atrophy. Mild chronic ischemic white matter disease. No acute intracranial abnormality seen. 2. Severe multilevel degenerative disc disease. No acute abnormality seen in the cervical spine. Electronically Signed   By: Lupita RaiderJames  Green Jr M.D.   On: 03/30/2019 14:53   CT Hip Left Wo Contrast  Result Date: 03/30/2019 CLINICAL DATA:  Left hip pain secondary to a fall today. Negative radiographs. EXAM: CT OF THE LEFT HIP WITHOUT CONTRAST TECHNIQUE: Multidetector CT imaging of the left hip was performed according to the standard protocol. Multiplanar CT image reconstructions were also generated. COMPARISON:  None. FINDINGS: Bones/Joint/Cartilage There is a minimally displaced fracture of the proximal tip of the left greater trochanter. The remainder of the proximal femur is intact. The visualized pelvic bones are  intact. Muscles and Tendons Normal. Soft tissues Sigmoid diverticulosis. IMPRESSION: Minimally displaced fracture of the proximal tip of the left greater trochanter. Electronically Signed   By: Francene BoyersJames  Maxwell M.D.   On: 03/30/2019 15:29   DG Hip Unilat With Pelvis 2-3 Views Left  Result Date: 03/30/2019 CLINICAL DATA:  Left hip pain after fall today.  Initial encounter. EXAM: DG HIP (WITH OR WITHOUT PELVIS) 2-3V LEFT COMPARISON:  None. FINDINGS: There is no evidence of hip fracture or dislocation. There is no evidence of arthropathy or other focal bone abnormality. IMPRESSION: Negative exam. Electronically Signed   By: Drusilla Kannerhomas  Dalessio M.D.   On: 03/30/2019 14:52    Pending Labs Unresulted Labs (From admission, onward)  Start     Ordered   04/06/19 0500  Creatinine, serum  (enoxaparin (LOVENOX)    CrCl >/= 30 ml/min)  Weekly,   STAT    Comments: while on enoxaparin therapy    03/30/19 1750   03/30/19 1749  CBC  (enoxaparin (LOVENOX)    CrCl >/= 30 ml/min)  Once,   STAT    Comments: Baseline for enoxaparin therapy IF NOT ALREADY DRAWN.  Notify MD if PLT < 100 K.    03/30/19 1750   03/30/19 1749  Creatinine, serum  (enoxaparin (LOVENOX)    CrCl >/= 30 ml/min)  Once,   STAT    Comments: Baseline for enoxaparin therapy IF NOT ALREADY DRAWN.    03/30/19 1750   03/30/19 1710  SARS CORONAVIRUS 2 (TAT 6-24 HRS) Nasopharyngeal Nasopharyngeal Swab  (Asymptomatic/Tier 3)  Once,   STAT    Question Answer Comment  Is this test for diagnosis or screening Screening   Symptomatic for COVID-19 as defined by CDC No   Hospitalized for COVID-19 No   Admitted to ICU for COVID-19 No   Previously tested for COVID-19 No   Resident in a congregate (group) care setting No   Employed in healthcare setting No   Pregnant No      03/30/19 1710          Vitals/Pain Today's Vitals   03/30/19 1328 03/30/19 1329  BP: (!) 194/71   Pulse: 94   Resp: 18   Temp: 97.8 F (36.6 C)   TempSrc: Oral   SpO2:  100%   Weight:  63.5 kg  Height:  5\' 2"  (1.575 m)  PainSc: 5      Isolation Precautions No active isolations  Medications Medications  acetaminophen (TYLENOL) tablet 1,000 mg (has no administration in time range)  amLODipine (NORVASC) tablet 5 mg (has no administration in time range)  isosorbide mononitrate (IMDUR) 24 hr tablet 60 mg (has no administration in time range)  cloNIDine (CATAPRES - Dosed in mg/24 hr) patch 0.2 mg (has no administration in time range)  nitroGLYCERIN (NITROSTAT) SL tablet 0.4 mg (has no administration in time range)  rosuvastatin (CRESTOR) tablet 20 mg (has no administration in time range)  spironolactone (ALDACTONE) tablet 25 mg (has no administration in time range)  torsemide (DEMADEX) tablet 10 mg (has no administration in time range)  magnesium oxide (MAG-OX) tablet 400 mg (has no administration in time range)  pantoprazole (PROTONIX) EC tablet 40 mg (has no administration in time range)  senna-docusate (Senokot-S) tablet 1 tablet (has no administration in time range)  clopidogrel (PLAVIX) tablet 75 mg (has no administration in time range)  gabapentin (NEURONTIN) capsule 400 mg (has no administration in time range)  timolol (BETIMOL) 0.5 % ophthalmic solution 1 drop (has no administration in time range)  tiZANidine (ZANAFLEX) tablet 2 mg (has no administration in time range)  morphine 2 MG/ML injection 0.5 mg (has no administration in time range)  enoxaparin (LOVENOX) injection 40 mg (has no administration in time range)  oxyCODONE (Oxy IR/ROXICODONE) immediate release tablet 5-10 mg (has no administration in time range)  polyethylene glycol (MIRALAX / GLYCOLAX) packet 17 g (has no administration in time range)  insulin aspart (novoLOG) injection 0-9 Units (has no administration in time range)  insulin aspart (novoLOG) injection 0-5 Units (has no administration in time range)  sodium chloride flush (NS) 0.9 % injection 3 mL (3 mLs Intravenous Given  03/30/19 1629)  acetaminophen (TYLENOL) tablet 1,000 mg (1,000 mg Oral Given 03/30/19  1628)  fentaNYL (SUBLIMAZE) injection 25 mcg (25 mcg Intravenous Given 03/30/19 1629)  lactated ringers bolus 1,000 mL (0 mLs Intravenous Stopped 03/30/19 1835)    Mobility non-ambulatory Low fall risk   Focused Assessments Cardiac Assessment Handoff:    Lab Results  Component Value Date   CKTOTAL 75 08/16/2012   CKMB 1.3 08/16/2012   TROPONINI <0.03 08/13/2017   No results found for: DDIMER Does the Patient currently have chest pain? No     R Recommendations: See Admitting Provider Note  Report given to:   Additional Notes:

## 2019-03-30 NOTE — H&P (Signed)
TRH H&P   Patient Demographics:    Harvel QualeJanice Kalata, is a 85 y.o. female  MRN: [id number]   DOB - [birth year]  Admit Date - 03/30/2019  Outpatient Primary MD for the patient is Danella PentonMiller, Mark F, MD  Referring MD: ED  Outpatient Specialists: None  Patient coming from: Home  Chief Complaint  Patient presents with  . Fall      HPI:    Harvel QualeJanice Woodbury  is a 85 y.o. female, with history of hypertension, CAD, chronic kidney disease? Stage 3, spinal stenosis, diabetes mellitus type 2 who lives at home with her son presented with a fall at home.  She reports that this morning after she had breakfast she stood up and was putting away the lid to obtain underneath the stove.  When she stood up she started walking backwards and became unsteady on her feet and landed on her left side.  She also hit her forehead against a bar cart.  Denies any dizziness, syncope or loss of consciousness.  She had severe pain in her left hip without any radiation.  Denies any chest pain, dizziness, palpitations, shortness of breath, fevers, chills, nausea, vomiting, headache, blurred vision, abdominal pain, dysuria, diarrhea, muscle weakness, tingling or numbness.  She reports that 2 weeks back she fell in the bathroom while trying to put on her clothes and landed on her back.  Denied any loss of consciousness at that time.  At baseline she uses a walker (not very frequently).  Denies any recent illness or sick contact.  Denies any change in her medications.  In the ED her blood pressure was elevated with systolic in the 190s.  Afebrile.  Blood work showed WC of 11.5k, BUN of 26 and creatinine of 1.39 which is at baseline.  CBG was normal.  A CT of the head and cervical spine was negative for any injury.  A CT chest without contrast was done which showed superior endplate compression deformity of T2 and T3 (acute versus  subacute).  A CT of the left hip done showed minimally displaced fracture of the proximal tip of the left greater trochanter. Orthopedic surgeon on-call was consulted by ED physician who recommended nonoperative management with pain control and PT evaluation and some weightbearing. Patient went to the bathroom in the ED and while sitting in the commode had a near syncopal episode.  EKG done showed normal sinus rhythm with PVCs.  Hospitalist consulted for observation on telemetry with pain control and PT evaluation.  COVID-19 test sent from ED and pending.   Review of systems:    In addition to the HPI above, No Fever-chills, No Headache, No changes with Vision or hearing, No problems swallowing food or Liquids, No Chest pain, Cough or Shortness of Breath, No Abdominal pain, No Nausea or vomiting, Bowel movements are regular, No Blood in stool or Urine, No dysuria, No  new skin rashes or bruises, No new joints pains-aches,  Low back and left hip pain No new weakness, tingling, numbness in any extremity, No recent weight gain or loss, No polyuria, polydypsia or polyphagia, No significant Mental Stressors.     With Past History of the following :    Past Medical History:  Diagnosis Date  . Hypercholesteremia   . Hypertension   . Myocardial infarction ([place]) 2008  . S/P angioplasty with stent       Past Surgical History:  Procedure Laterality Date  . ABDOMINAL HYSTERECTOMY    . APPENDECTOMY    . CORONARY ANGIOPLASTY WITH STENT PLACEMENT        Social History:     Social History   Tobacco Use  . Smoking status: Never Smoker  . Smokeless tobacco: Never Used  Substance Use Topics  . Alcohol use: No     Lives -home with son  Mobility -independent, occasionally using walker     Family History :     Family History  Problem Relation Age of Onset  . Breast cancer Maternal Aunt       Home Medications:   Prior to Admission medications   Medication Sig Start  Date End Date Taking? Authorizing Provider  acetaminophen (TYLENOL) 500 MG tablet Take 1,000 mg by mouth every 6 (six) hours as needed. 1-2 tabs daily as needed For pain    [provider]  AMLODIPINE BESYLATE PO Take 5 mg by mouth 2 (two) times daily.    [provider]  aspirin EC 81 MG tablet Take 81 mg by mouth daily.    [provider]  cloNIDine (CATAPRES - DOSED IN MG/24 HR) 0.2 mg/24hr patch Place 0.2 mg onto the skin once a week.    [provider]  clopidogrel (PLAVIX) 75 MG tablet Take 75 mg by mouth daily.    [provider]  gabapentin (NEURONTIN) 100 MG capsule Take 4 capsules by mouth daily. 02/14/15   [provider]  glimepiride (AMARYL) 1 MG tablet Take by mouth. 08/29/18 08/29/19  [provider]  isosorbide mononitrate (IMDUR) 60 MG 24 hr tablet Take 60 mg by mouth daily.    [provider]  magnesium oxide (MAG-OX) 400 MG tablet Take by mouth.    [provider]  nitroGLYCERIN (NITROSTAT) 0.4 MG SL tablet Place 0.4 mg under the tongue every 5 (five) minutes as needed. For chest pain    [provider]  omeprazole (PRILOSEC) 40 MG capsule  11/22/18   [provider]  rosuvastatin (CRESTOR) 20 MG tablet Take 20 mg by mouth daily.    [provider]  senna-docusate (SENOKOT-S) 8.6-50 MG per tablet Take 1 tablet by mouth 2 (two) times daily.    [provider]  spironolactone (ALDACTONE) 25 MG tablet Take 25 mg by mouth daily.    [provider]  timolol (BETIMOL) 0.5 % ophthalmic solution Place 1 drop into both eyes every evening.    [provider]  torsemide (DEMADEX) 10 MG tablet Take 10 mg by mouth every evening.     [provider]     Allergies:     Allergies  Allergen Reactions  . Butenafine Swelling    Tongue swelled and turned red  . Levofloxacin     Other reaction(s): Other (See Comments) Tongue swelled and turned red  .  Limonene Other (See Comments)    Other reaction(s): Other (See Comments) Tongue swelled and turned red Tongue swelled  and turned red   . Terbinafine And Related Other (See Comments)    Tongue swelled and turned red     Physical Exam:   Vitals  Blood pressure (!) 194/71, pulse 94, temperature 97.8 F (36.6 C), temperature source Oral, resp. rate 18, height 5\' 2"  (1.575 m), weight 63.5 kg, SpO2 100 %.   General: Elderly female lying in bed in no acute distress HEENT: Pupils reactive bilaterally, EOMI, no pallor, no icterus, moist mucosa, supple neck Chest: Clear bilaterally CVs: Normal S1-S2, no murmurs rub or gallop GI: Soft, nontender, nondistended, bowel sounds present Musculoskeletal: Warm, limited mobility of the left hip due to pain, did not examine her back, normal sensation in bilateral lower extremities CNS: Alert and oriented, nonfocal   Data Review:    CBC Recent Labs  Lab 03/30/19 1421  WBC 11.5*  HGB 12.9  HCT 37.8  PLT 396  MCV 93.6  MCH 31.9  MCHC 34.1  RDW 13.3   ------------------------------------------------------------------------------------------------------------------  Chemistries  Recent Labs  Lab 03/30/19 1421  NA 140  K 3.9  CL 101  CO2 29  GLUCOSE 99  BUN 26*  CREATININE 1.39*  CALCIUM 9.8   ------------------------------------------------------------------------------------------------------------------ estimated creatinine clearance is 25.5 mL/min (A) (by C-G formula based on SCr of 1.39 mg/dL (H)). ------------------------------------------------------------------------------------------------------------------ No results for input(s): TSH, T4TOTAL, T3FREE, THYROIDAB in the last 72 hours.  Invalid input(s): FREET3  Coagulation profile No results for input(s): INR, PROTIME in the last 168 hours. ------------------------------------------------------------------------------------------------------------------- No results for  input(s): DDIMER in the last 72 hours. -------------------------------------------------------------------------------------------------------------------  Cardiac Enzymes No results for input(s): CKMB, TROPONINI, MYOGLOBIN in the last 168 hours.  Invalid input(s): CK ------------------------------------------------------------------------------------------------------------------    Component Value Date/Time   BNP 307 08/15/2012 1031     ---------------------------------------------------------------------------------------------------------------  Urinalysis    Component Value Date/Time   COLORURINE COLORLESS (A) 03/30/2019 1421   APPEARANCEUR CLEAR (A) 03/30/2019 1421   APPEARANCEUR CLEAR 08/15/2012 1031   LABSPEC 1.006 03/30/2019 1421   LABSPEC 1.015 08/15/2012 1031   PHURINE 7.0 03/30/2019 1421   GLUCOSEU NEGATIVE 03/30/2019 1421   GLUCOSEU 300 mg/dL 84/13/244004/28/2014 10271031   HGBUR NEGATIVE 03/30/2019 1421   BILIRUBINUR NEGATIVE 03/30/2019 1421   BILIRUBINUR NEGATIVE 08/15/2012 1031   KETONESUR NEGATIVE 03/30/2019 1421   PROTEINUR NEGATIVE 03/30/2019 1421   NITRITE NEGATIVE 03/30/2019 1421   LEUKOCYTESUR TRACE (A) 03/30/2019 1421   LEUKOCYTESUR NEGATIVE 08/15/2012 1031    ----------------------------------------------------------------------------------------------------------------   Imaging Results:    CT Head Wo Contrast  Result Date: 03/30/2019 CLINICAL DATA:  Head injury after fall. No loss of consciousness. EXAM: CT HEAD WITHOUT CONTRAST CT CERVICAL SPINE WITHOUT CONTRAST TECHNIQUE: Multidetector CT imaging of the head and cervical spine was performed following the standard protocol without intravenous contrast. Multiplanar CT image reconstructions of the cervical spine were also generated. COMPARISON:  March 15, 2019. FINDINGS: CT HEAD FINDINGS Brain: Mild diffuse cortical atrophy is noted. Mild chronic ischemic white matter disease is noted. No mass effect or  midline shift is noted. Ventricular size is within normal limits. There is no evidence of mass lesion, hemorrhage or acute infarction. Vascular: No hyperdense vessel or unexpected calcification. Skull: Normal. Negative for fracture or focal lesion. Sinuses/Orbits: No acute finding. Other: None. CT CERVICAL SPINE FINDINGS Alignment: Mild grade 1 anterolisthesis of C4-5 is noted secondary to posterior facet joint hypertrophy. Skull base and vertebrae: No acute fracture. No primary bone lesion or focal pathologic process. Soft tissues and spinal canal: No prevertebral fluid or swelling. No visible canal  hematoma. Disc levels: Severe degenerative disc disease is noted at C3-4, C5-6 and C6-7 with anterior osteophyte formation. Upper chest: Negative. Other: Severe hypertrophy of the right-sided posterior facet joints is noted at C2-3 and C3-4. IMPRESSION: 1. Mild diffuse cortical atrophy. Mild chronic ischemic white matter disease. No acute intracranial abnormality seen. 2. Severe multilevel degenerative disc disease. No acute abnormality seen in the cervical spine. Electronically Signed   By: Lupita Raider M.D.   On: 03/30/2019 14:53   CT Chest Wo Contrast  Result Date: 03/30/2019 CLINICAL DATA:  Chest pain after fall EXAM: CT CHEST WITHOUT CONTRAST TECHNIQUE: Multidetector CT imaging of the chest was performed following the standard protocol without IV contrast. COMPARISON:  CT 10/25/2006 FINDINGS: Cardiovascular: Heart size is normal. No pericardial effusion. Thoracic aorta is nonaneurysmal. There are atherosclerotic calcifications of the aorta and coronary arteries. Main pulmonary trunk is nondilated. Mediastinum/Nodes: No enlarged mediastinal or axillary lymph nodes. Thyroid gland, trachea, and esophagus demonstrate no significant findings. Lungs/Pleura: Lungs are clear. No pleural effusion or pneumothorax. Upper Abdomen: No acute abnormality. Musculoskeletal: Subtle superior endplate compression deformities  of T2 and T3 with less than 25% height loss, which appear acute to subacute. No bony retropulsion. No displaced rib fracture. IMPRESSION: 1. Subtle superior endplate compression deformities of T2 and T3, which appear acute to subacute. 2. Otherwise, no acute findings within the chest. 3. Coronary artery and aortic atherosclerosis. Aortic Atherosclerosis (ICD10-I70.0). Electronically Signed   By: Duanne Guess M.D.   On: 03/30/2019 14:57   CT Cervical Spine Wo Contrast  Result Date: 03/30/2019 CLINICAL DATA:  Head injury after fall. No loss of consciousness. EXAM: CT HEAD WITHOUT CONTRAST CT CERVICAL SPINE WITHOUT CONTRAST TECHNIQUE: Multidetector CT imaging of the head and cervical spine was performed following the standard protocol without intravenous contrast. Multiplanar CT image reconstructions of the cervical spine were also generated. COMPARISON:  March 15, 2019. FINDINGS: CT HEAD FINDINGS Brain: Mild diffuse cortical atrophy is noted. Mild chronic ischemic white matter disease is noted. No mass effect or midline shift is noted. Ventricular size is within normal limits. There is no evidence of mass lesion, hemorrhage or acute infarction. Vascular: No hyperdense vessel or unexpected calcification. Skull: Normal. Negative for fracture or focal lesion. Sinuses/Orbits: No acute finding. Other: None. CT CERVICAL SPINE FINDINGS Alignment: Mild grade 1 anterolisthesis of C4-5 is noted secondary to posterior facet joint hypertrophy. Skull base and vertebrae: No acute fracture. No primary bone lesion or focal pathologic process. Soft tissues and spinal canal: No prevertebral fluid or swelling. No visible canal hematoma. Disc levels: Severe degenerative disc disease is noted at C3-4, C5-6 and C6-7 with anterior osteophyte formation. Upper chest: Negative. Other: Severe hypertrophy of the right-sided posterior facet joints is noted at C2-3 and C3-4. IMPRESSION: 1. Mild diffuse cortical atrophy. Mild chronic  ischemic white matter disease. No acute intracranial abnormality seen. 2. Severe multilevel degenerative disc disease. No acute abnormality seen in the cervical spine. Electronically Signed   By: Lupita Raider M.D.   On: 03/30/2019 14:53   CT Hip Left Wo Contrast  Result Date: 03/30/2019 CLINICAL DATA:  Left hip pain secondary to a fall today. Negative radiographs. EXAM: CT OF THE LEFT HIP WITHOUT CONTRAST TECHNIQUE: Multidetector CT imaging of the left hip was performed according to the standard protocol. Multiplanar CT image reconstructions were also generated. COMPARISON:  None. FINDINGS: Bones/Joint/Cartilage There is a minimally displaced fracture of the proximal tip of the left greater trochanter. The remainder of the proximal  femur is intact. The visualized pelvic bones are intact. Muscles and Tendons Normal. Soft tissues Sigmoid diverticulosis. IMPRESSION: Minimally displaced fracture of the proximal tip of the left greater trochanter. Electronically Signed   By: Francene Boyers M.D.   On: 03/30/2019 15:29   DG Hip Unilat With Pelvis 2-3 Views Left  Result Date: 03/30/2019 CLINICAL DATA:  Left hip pain after fall today.  Initial encounter. EXAM: DG HIP (WITH OR WITHOUT PELVIS) 2-3V LEFT COMPARISON:  None. FINDINGS: There is no evidence of hip fracture or dislocation. There is no evidence of arthropathy or other focal bone abnormality. IMPRESSION: Negative exam. Electronically Signed   By: Drusilla Kanner M.D.   On: 03/30/2019 14:52    My personal review of EKG: normal sinus rhythm at 82 with PVCs, normal QTC   Assessment & Plan:    Principal Problem:   Greater trochanter fracture (HCC) Minimally displaced.  ED physician spoke with orthopedic consult on call (Dr. Odis Luster) who recommended nonoperative management with pain control and PT evaluation. Placed on observation.  Pain control with as needed oxycodone and low-dose morphine for severe pain.  Added bowel regimen.  Hip fracture  pathway initiated. Had recent fall landing on her back 2 weeks ago. PT/OT evaluation.  Social work consult.  Active Problems: Near syncope Suspect this is vasovagal.  EKG without ischemic changes or QT prolongation.  Show some PVCs. Monitor K and mag.  Monitor on telemetry.    Essential hypertension/accelerated hypertension Patient on multiple antihypertensives.  Blood pressure likely elevated due to pain.  Have resumed her home blood pressure meds.  Diabetes mellitus type 2, controlled Resume gabapentin.  Lumbar stenosis with neurogenic claudication .  Patient reports taking Zanaflex as needed.  Resumed.    CKD (chronic kidney disease) stage 3, GFR 30-59 ml/min Renal function seems at baseline.  Monitor.  CAD with history of MI Aspirin and statin      DVT Prophylaxis: Subcu Lovenox  AM Labs Ordered, also please review Full Orders  Family Communication: Admission, patients condition and plan of care including tests being ordered have been discussed with the patient and her son at bedside  Code Status discussed CODE STATUS.  Denies having a living will and prefers full scope of treatment at present  Likely DC to home with home health versus SNF  Condition: Fair  Consults called: Discussed with orthopedic surgeon on the phone by ED  Admission status: Observation  Time spent in minutes : 50   Mayelin Panos M.D on 03/30/2019 at 5:51 PM  Between 7am to 7pm - Pager - [phone]. After 7pm go to [email or web address] - password [facility]  [facility] - Office  [phone]

## 2019-03-30 NOTE — ED Notes (Signed)
Report given to Kate, RN

## 2019-03-30 NOTE — ED Notes (Signed)
Attempted to call for report- per charge RN they still did not know what room she was going to and states they will call back
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
## 2019-03-30 NOTE — ED Provider Notes (Addendum)
-----------------------------------------   3:59 PM on 03/30/2019 -----------------------------------------  Blood pressure (!) 194/71, pulse 94, temperature 97.8 F (36.6 C), temperature source Oral, resp. rate 18, height 5\' 2"  (1.575 m), weight 63.5 kg, SpO2 100 %.  Assuming care from Dr. Joan Mayans.  In short, [name] is a 85 y.o. female with a chief complaint of Fall .  Refer to the original H&P for additional details.  The current plan of care is to follow-up after pain medication administration, if able to walk she will be appropriate for discharge home.  After pain medication administration, patient was able to ambulate to the bathroom with the assistance of a walker, however she had a syncopal episode while sitting on the toilet.  She had to be lifted back onto the stretcher and was subsequently placed in a room.  Blood glucose noted to be normal and EKG showed no acute changes.  Patient gradually returned to her baseline mental status.  Given this episode, patient seems to be a poor candidate for discharge home with pain control.  Case discussed with hospitalist, who accepts patient for admission.  ED ECG REPORT I, Blake Divine, the attending physician, personally viewed and interpreted this ECG.   Date: 03/30/2019  EKG Time: 17:07  Rate: 82  Rhythm: normal sinus rhythm, PVC  Axis: Normal  Intervals:none  ST&T Change: None     Blake Divine, MD 03/30/19 2045    Blake Divine, MD 03/30/19 2045

## 2019-03-30 NOTE — ED Triage Notes (Signed)
Pt via ems from home after slip and fall. Fall was witnessed by son. Pt denies dizziness or LOC. Pt c/o pain to her left hip, knee, ribcage. Pt also has hematoma to left temple area. Pt alert & oriented, nad noted.

## 2019-03-30 NOTE — ED Notes (Signed)
Pt ambulated from stretcher to toilet with walker and with son's assistance. Pt had no difficulty ambulating but said she was feeling like she was going to pass out when she sat down. She slumped over the walker with open eyes but became momentarily unresponsive. Dr. Charna Archer, RN Aldona Bar, and this nurse carried pt to the stretcher and moved her into room 16. Pt has since gotten her color back. LR started, but IV infiltrated, so it was stopped in order to start a new line.

## 2019-03-31 DIAGNOSIS — R55 Syncope and collapse: Secondary | ICD-10-CM | POA: Diagnosis not present

## 2019-03-31 DIAGNOSIS — S72112D Displaced fracture of greater trochanter of left femur, subsequent encounter for closed fracture with routine healing: Secondary | ICD-10-CM | POA: Diagnosis not present

## 2019-03-31 DIAGNOSIS — S72112A Displaced fracture of greater trochanter of left femur, initial encounter for closed fracture: Secondary | ICD-10-CM | POA: Diagnosis not present

## 2019-03-31 DIAGNOSIS — N183 Chronic kidney disease, stage 3 unspecified: Secondary | ICD-10-CM | POA: Diagnosis not present

## 2019-03-31 DIAGNOSIS — I10 Essential (primary) hypertension: Secondary | ICD-10-CM | POA: Diagnosis not present

## 2019-03-31 LAB — GLUCOSE, CAPILLARY
Glucose-Capillary: 71 mg/dL (ref 70–99)
Glucose-Capillary: 147 mg/dL — ABNORMAL HIGH (ref 70–99)
Glucose-Capillary: 124 mg/dL — ABNORMAL HIGH (ref 70–99)

## 2019-03-31 LAB — SARS CORONAVIRUS 2 (TAT 6-24 HRS): SARS Coronavirus 2: NEGATIVE

## 2019-03-31 MED ORDER — OXYCODONE HCL 5 MG PO TABS: 5.0000 mg | ORAL_TABLET | ORAL | 0 refills | Status: DC | PRN

## 2019-03-31 MED ORDER — POLYETHYLENE GLYCOL 3350 17 G PO PACK: 17.0000 g | PACK | Freq: Every day | ORAL | 0 refills | Status: DC | PRN

## 2019-03-31 NOTE — Evaluation (Signed)
Physical Therapy Evaluation Patient Details Name: [name] MRN: [id number] DOB: 1930/12/01 Today's Date: 03/31/2019   History of Present Illness  From MD note: pt is an 85 y.o. female, with history of hypertension, CAD, chronic kidney disease stage 3, spinal stenosis, diabetes mellitus type 2 who lives at home with her son presented with a fall at home.  MD assessment includes: L minimally displaced greater trochanter fracture under conservative management, near syncope, HTN, DM II, lumbar stenosis with neurogenic claudication, CKD, and CAD with h/o MI.    Clinical Impression  Pt presented with deficits in strength, transfers, mobility, gait, balance, and activity tolerance.  Pt reported no pain at rest both pre and post WB activities.  Pt required min A to control the LLE in/out of bed with cues for general sequencing for decreased caregiver assistance.  Pt ambulated with very slow cadence 15' with distance limited by pt needing to have a BM.  Pt reports son available at home 24/7 with ramp entrance.  Pt will benefit fromHHPT services upon discharge to safely address above deficits for decreased caregiver assistance and eventual return to PLOF.      Follow Up Recommendations Home health PT;Supervision for mobility/OOB    Equipment Recommendations  None recommended by PT    Recommendations for Other Services       Precautions / Restrictions Precautions Precautions: Fall Restrictions Weight Bearing Restrictions: Yes LLE Weight Bearing: Weight bearing as tolerated Other Position/Activity Restrictions: No resisted L hip abduction      Mobility  Bed Mobility Overal bed mobility: Needs Assistance Bed Mobility: Supine to Sit;Sit to Supine     Supine to sit: Min assist Sit to supine: Min assist   General bed mobility comments: Min A for LLE in/out of bed and for trunk to full upright position  Transfers Overall transfer level: Needs assistance Equipment used: Rolling walker  (2 wheeled) Transfers: Sit to/from Stand           General transfer comment: Good eccentric and concentric control with transfers  Ambulation/Gait Ambulation/Gait assistance: Min guard Gait Distance (Feet): 15 Feet Assistive device: Rolling walker (2 wheeled) Gait Pattern/deviations: Step-to pattern;Antalgic;Decreased step length - right;Decreased stance time - left Gait velocity: decreased   General Gait Details: Mildly antalgic gait on the LLE with step-to pattern sequencing education provided for pain control  Stairs            Wheelchair Mobility    Modified Rankin (Stroke Patients Only)       Balance Overall balance assessment: Mild deficits observed, not formally tested                                           Pertinent Vitals/Pain Pain Assessment: No/denies pain    Home Living Family/patient expects to be discharged to:: Private residence Living Arrangements: Children Available Help at Discharge: Family;Available 24 hours/day Type of Home: House Home Access: Ramped entrance     Home Layout: One level Home Equipment: Walker - 2 wheels;Walker - 4 wheels      Prior Function Level of Independence: Independent         Comments: Ind Amb mostly without an AD but occasionally uses a RW when fatigued, two falls in the last 6 months, Ind with ADLs     Hand Dominance        Extremity/Trunk Assessment   Upper Extremity Assessment Upper  Extremity Assessment: Defer to OT evaluation    Lower Extremity Assessment Lower Extremity Assessment: Generalized weakness;LLE deficits/detail LLE Deficits / Details: L hip flex <3/5, limited by pain LLE: Unable to fully assess due to pain       Communication   Communication: No difficulties  Cognition Arousal/Alertness: Awake/alert Behavior During Therapy: WFL for tasks assessed/performed Overall Cognitive Status: Within Functional Limits for tasks assessed                                         General Comments      Exercises     Assessment/Plan    PT Assessment Patient needs continued PT services  PT Problem List Decreased strength;Decreased activity tolerance;Decreased balance;Decreased mobility;Decreased knowledge of use of DME       PT Treatment Interventions DME instruction;Gait training;Functional mobility training;Therapeutic activities;Therapeutic exercise;Balance training;Patient/family education    PT Goals (Current goals can be found in the Care Plan section)  Acute Rehab PT Goals Patient Stated Goal: To walk better and return home PT Goal Formulation: With patient Time For Goal Achievement: 04/13/19 Potential to Achieve Goals: Good    Frequency 7X/week   Barriers to discharge        Co-evaluation               AM-PAC PT "6 Clicks" Mobility  Outcome Measure Help needed turning from your back to your side while in a flat bed without using bedrails?: A Little Help needed moving from lying on your back to sitting on the side of a flat bed without using bedrails?: A Little Help needed moving to and from a bed to a chair (including a wheelchair)?: A Little Help needed standing up from a chair using your arms (e.g., wheelchair or bedside chair)?: A Little Help needed to walk in hospital room?: A Little Help needed climbing 3-5 steps with a railing? : A Little 6 Click Score: 18    End of Session Equipment Utilized During Treatment: Gait belt Activity Tolerance: Patient tolerated treatment well Patient left: Other (comment);with nursing/sitter in room(Pt left in BR having BM with CNA in room) Nurse Communication: Mobility status PT Visit Diagnosis: History of falling (Z91.81);Difficulty in walking, not elsewhere classified (R26.2);Muscle weakness (generalized) (M62.81);Pain Pain - Right/Left: Left Pain - part of body: Hip    Time: 1130-1155 PT Time Calculation (min) (ACUTE ONLY): 25 min   Charges:   PT Evaluation $PT  Eval Moderate Complexity: 1 Mod          D. Scott Jazminn Pomales PT, DPT 03/31/19, 1:15 PM

## 2019-03-31 NOTE — Progress Notes (Signed)
Initial Nutrition Assessment  DOCUMENTATION CODES:   Not applicable  INTERVENTION:  -Magic cup TID with meals, each supplement provides 290 kcal and 9 grams of protein  -MVI with minerals daily   NUTRITION DIAGNOSIS:   Increased nutrient needs related to acute illness(nonoperative management of displaced fx of left greater trochanter; near syncopal episode in ED) as evidenced by estimated needs.   GOAL:   Patient will meet greater than or equal to 90% of their needs  MONITOR:   Labs, Skin, PO intake, Weight trends, Supplement acceptance  REASON FOR ASSESSMENT:   Consult Assessment of nutrition requirement/status  ASSESSMENT:  RD working remotely.  85 year old female with past medical history of HTN, HLD, CAD, CKD3, spinal stenosis, T2DM, h/o MI s/p angioplasty with stent in 2008 who presented to ED with c/o severe pain in left hip after mechanical fall at home. CT chest showed superior endplate compression deformity of T2 and T3; CT left hip showed minimally displaced fracture of proximal tip of the left greater trochanter. Nonoperative management with pain control and PT evaluation recommended by on-call orthopedics. In ED, pt noted to have gone to the bathroom and had near syncopal episode while sitting on the commode and admitted for observation on telemetry.   Spoke with pt via phone this afternoon, she reports feeling good today and stated "Im not quite ready to jump through any hoops, but I think I am ready to go home" Patient denies decreased appetite/intake at home. She reports that she eats a late breakfast around 10 am and recalls  Frosted Flakes, oatmeal, or grits, toast, and coffee. She reports occasional snacking later in the day and eating dinner around 6. Patient recalls cooking meat with vegetables or take-out brought home by her son. Lunch tray delivered during conversation, she reports ordering meatloaf, salad, and vegetable soup; stated the meatloaf looked  delicious.  Patient reports no po intake yesterday until 11 pm secondary to her reporting to ED s/p fall in the morning. She stated that she was starving and asked for ice cream once getting to the floor. Patient recalls RN bringing a [place] sandwich, applesauce, and chips. Patient reports enjoying the sandwich and consuming 100%. RD offered patient Magic Cup ice cream supplement with meals; pt delighted and amenable to chocolate or vanilla.    Current wt 63.5 kg (139.7 lbs) Limited wt history for review, pt noted 60.3 kg - 65.8 kg over the past 2 months.  03/15/19 65.8 kg  01/23/19 60.3 kg    Medications reviewed and include: Gabapentin, Plavix, SSI with meals, Imdur, Mag-ox, Protonix  Labs: CBGs 71-92 x 2 since admit, BUN 26 (H), Cr 1.39 (H), WBC 11.5 (H)  NUTRITION - FOCUSED PHYSICAL EXAM: Unable to complete at this time, RD working remotely.  Diet Order:   Diet Order            Diet heart healthy/carb modified Room service appropriate? Yes; Fluid consistency: Thin  Diet effective now              EDUCATION NEEDS:   No education needs have been identified at this time  Skin:  Skin Assessment: Reviewed RN Assessment  Last BM:  12/9  Height:   Ht Readings from Last 1 Encounters:  03/30/19 5\' 2"  (1.575 m)    Weight:   Wt Readings from Last 1 Encounters:  03/30/19 63.5 kg    Ideal Body Weight:  50 kg  BMI:  Body mass index is 25.61 kg/m.  Estimated  Nutritional Needs:   Kcal:  1250-1450  Protein:  70-76  Fluid:  >/= 1 L/day   Lars Masson, RD, LDN Clinical Nutrition [place] Telephone [phone] After Hours/Weekend Pager: [phone]

## 2019-03-31 NOTE — Evaluation (Signed)
Occupational Therapy Evaluation Patient Details Name: [name] MRN: [id number] DOB: May 24, 1930 Today's Date: 03/31/2019    History of Present Illness From MD note: pt is an 85 y.o. female, with history of hypertension, CAD, chronic kidney disease stage 3, spinal stenosis, diabetes mellitus type 2 who lives at home with her son presented with a fall at home.  MD assessment includes: L minimally displaced greater trochanter fracture under conservative management, near syncope, HTN, DM II, lumbar stenosis with neurogenic claudication, CKD, and CAD with h/o MI.   Clinical Impression   Pt seen for OT evaluation this date. Pt received supine in bed reporting pain in her LLE minimally increasing, but agreeable to OT evaluation. Pt reports she was generally independent in all ADLs prior to surgery, however she endorses at least 2 falls in the past 6 months and states she typically sponge bathes due to fear of falling in her walk-in shower. Pt is eager to return to PLOF with less pain and improved safety and independence. Pt currently requires minimal assist for LB dressing and bathing while in seated position due to pain and limited AROM of her LLE. Pt instructed in self care skills, falls prevention strategies, home/routines modifications, DME/AE for LB bathing and dressing tasks, and compression stocking mgt strategies. Pt would benefit from additional instruction in self care skills and techniques with or without assistive devices to support recall and carryover prior to discharge. Recommend HHOT upon discharge.      Follow Up Recommendations  Home health OT    Equipment Recommendations  3 in 1 bedside commode    Recommendations for Other Services       Precautions / Restrictions Precautions Precautions: Fall Restrictions Weight Bearing Restrictions: Yes LLE Weight Bearing: Weight bearing as tolerated Other Position/Activity Restrictions: No resisted L hip abduction      Mobility Bed  Mobility Overal bed mobility: Needs Assistance Bed Mobility: Supine to Sit;Sit to Supine     Supine to sit: Min assist Sit to supine: Min assist   General bed mobility comments: Min A for LLE in/out of bed and for trunk to full upright position  Transfers Overall transfer level: Needs assistance Equipment used: Rolling walker (2 wheeled) Transfers: Sit to/from Stand Sit to Stand: Min guard;Supervision         General transfer comment: Good eccentric and concentric control with transfers    Balance Overall balance assessment: Mild deficits observed, not formally tested                                         ADL either performed or assessed with clinical judgement   ADL Overall ADL's : Needs assistance/impaired                                       General ADL Comments: Min-Mod assist for LB ADL management due to pain and decreased ROM for the LLE. Supervision for safety during functional transfers. Pt requires consistent cueing for safe technique this date. Performs toilet transfer and peri-care with supervision for safety.     Vision Baseline Vision/History: Wears glasses Wears Glasses: Reading only Patient Visual Report: No change from baseline       Perception     Praxis      Pertinent Vitals/Pain Pain Assessment: 0-10 Pain Score: 4  Pain Location: LLE with attempts to move it in bed. Pain Descriptors / Indicators: Aching;Sore Pain Intervention(s): Limited activity within patient's tolerance;Monitored during session;Repositioned     Hand Dominance Right   Extremity/Trunk Assessment Upper Extremity Assessment Upper Extremity Assessment: (5th digit broken on LUE.)   Lower Extremity Assessment Lower Extremity Assessment: LLE deficits/detail;Generalized weakness;Defer to PT evaluation LLE Deficits / Details: L hip flex <3/5, limited by pain LLE: Unable to fully assess due to pain LLE Coordination: decreased gross  motor;decreased fine motor       Communication Communication Communication: No difficulties   Cognition Arousal/Alertness: Awake/alert Behavior During Therapy: WFL for tasks assessed/performed Overall Cognitive Status: Within Functional Limits for tasks assessed                                     General Comments       Exercises Other Exercises Other Exercises: Pt educated in falls prevention strategies, safe use of AE/DME for ADL mgt, safe use of AE for functional mobility, and safe transfer techniques this date. Pt return verbalizes understanding of education provided but would benefit from review.   Shoulder Instructions      Home Living Family/patient expects to be discharged to:: Private residence Living Arrangements: Children Available Help at Discharge: Family;Available 24 hours/day Type of Home: House Home Access: Ramped entrance     Home Layout: One level     Bathroom Shower/Tub: Walk-in shower         Home Equipment: Shower seat - built in;Shower seat;Hand held shower head;Grab bars - tub/shower   Additional Comments: Levy currently stuck. Plans to have son fix it.      Prior Functioning/Environment Level of Independence: Independent        Comments: Ind Amb mostly without an AD but occasionally uses a RW when fatigued, two falls in the last 6 months, Ind with ADLs; Sponge bathes, is afriad of falling in her walk-in shower.        OT Problem List: Decreased strength;Decreased coordination;Pain;Decreased range of motion;Decreased activity tolerance;Decreased safety awareness;Impaired balance (sitting and/or standing);Decreased knowledge of use of DME or AE      OT Treatment/Interventions: Self-care/ADL training;Therapeutic exercise;Therapeutic activities;DME and/or AE instruction;Patient/family education;Balance training    OT Goals(Current goals can be found in the care plan section) Acute Rehab OT Goals Patient Stated  Goal: To walk better and return home OT Goal Formulation: With patient Time For Goal Achievement: 04/14/19 Potential to Achieve Goals: Good ADL Goals Pt Will Perform Lower Body Dressing: with modified independence;sit to/from stand(With LRAD PRN for improved safety and functional independence.) Pt Will Transfer to Toilet: bedside commode;with modified independence;regular height toilet;ambulating(With LRAD PRN for improved safety and functional independence.) Pt Will Perform Toileting - Clothing Manipulation and hygiene: sit to/from stand;with modified independence;with adaptive equipment(With LRAD PRN for improved safety and functional independence.)  OT Frequency: Min 1X/week   Barriers to D/C:            Co-evaluation              AM-PAC OT "6 Clicks" Daily Activity     Outcome Measure Help from another person eating meals?: None Help from another person taking care of personal grooming?: None Help from another person toileting, which includes using toliet, bedpan, or urinal?: A Little Help from another person bathing (including washing, rinsing, drying)?: A Little Help from another person to put on and taking  off regular upper body clothing?: None Help from another person to put on and taking off regular lower body clothing?: A Little 6 Click Score: 21   End of Session Equipment Utilized During Treatment: Gait belt;Rolling walker Nurse Communication: Patient requests pain meds  Activity Tolerance: Patient tolerated treatment well Patient left: in bed;with call bell/phone within reach;with bed alarm set  OT Visit Diagnosis: Other abnormalities of gait and mobility (R26.89);History of falling (Z91.81);Pain Pain - Right/Left: Left Pain - part of body: Hip;Knee;Leg                Time: 1410-1448 OT Time Calculation (min): 38 min Charges:  OT General Charges $OT Visit: 1 Visit OT Evaluation $OT Eval Moderate Complexity: 1 Mod OT Treatments $Self Care/Home Management :  23-37 mins  Rockney Ghee, M.S., OTR/L Ascom: [phone] 03/31/19, 4:07 PM

## 2019-03-31 NOTE — TOC Transition Note (Signed)
Transition of Care [facility]) - CM/SW Discharge Note   Patient Details  Name: [name] MRN: [id number] Date of Birth: 04/03/1931  [facility]) CM/SW Contact:  Trecia Rogers, LCSW Phone Number: 03/31/2019, 4:35 PM   Clinical Narrative:     Patient is [facility]. Patient will be going home with her son and sister.   Final next level of care: [place] Barriers to Discharge: Continued Medical Work up   Patient Goals and CMS Choice Patient states their goals for this hospitalization and ongoing recovery are:: "go home" CMS Medicare.gov Compare Post Acute Care list provided to:: Patient Choice offered to / list presented to : Patient  Discharge Placement                       Discharge Plan and Services In-house Referral: Clinical Social Work   Post Acute Care Choice: Museum/gallery conservator, Home Health                    HH Arranged: PT, OT [facility] Agency: [place] ([facility]) Date HH Agency Contacted: 03/31/19 Time [place]: [phone] Representative spoke with at [place]: [place] ([place]) Interventions     Readmission Risk Interventions No flowsheet data found.

## 2019-03-31 NOTE — Progress Notes (Signed)
Patients son notified of discharge and need of transportation.

## 2019-03-31 NOTE — Progress Notes (Signed)
Discharge instructions reviewed with patient and IV removed.

## 2019-03-31 NOTE — Plan of Care (Signed)
?  Problem: Education: ?Goal: Knowledge of General Education information will improve ?Description: Including pain rating scale, medication(s)/side effects and non-pharmacologic comfort measures ?Outcome: Adequate for Discharge ?  ?Problem: Pain Managment: ?Goal: General experience of comfort will improve ?Outcome: Adequate for Discharge ?  ?Problem: Safety: ?Goal: Ability to remain free from injury will improve ?Outcome: Adequate for Discharge ?  ?
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
## 2019-03-31 NOTE — Discharge Summary (Signed)
Physician Discharge Summary  Aleynah Rocchio Luepke PYP:[id number] DOB: [birth year] DOA: 03/30/2019  PCP: Danella Penton, MD  Admit date: 03/30/2019 Discharge date: 03/31/2019  Admitted From: Home Disposition: Home  Recommendations for Outpatient Follow-up:  1. Follow up with PCP in 1-2 weeks 2. Has appointment to see orthopedic surgeon Dr. Odis Luster on 12/29  Home Health: RN PT and OT Equipment/Devices: Has rolling walker  Discharge Condition: Fair CODE STATUS:Full code Diet recommendation: Heart healthy/carb modified    Discharge Diagnoses:  Principal Problem:   Closed avulsion fracture of greater trochanter of left femur (HCC)  Active Problems:   Essential hypertension   Neuropathy   CAD (coronary artery disease)   Type 2 diabetes mellitus (HCC)   CKD (chronic kidney disease) stage 3, GFR 30-59 ml/min   Lumbar stenosis with neurogenic claudication   Spinal stenosis of lumbar region   Near syncope  Brief narrative/HPI 85 y.o. female, with history of hypertension, CAD, chronic kidney disease? Stage 3, spinal stenosis, diabetes mellitus type 2 who lives at home with her son presented with a fall at home.  She reports that this morning after she had breakfast she stood up and was putting away the lid to obtain underneath the stove.  When she stood up she started walking backwards and became unsteady on her feet and landed on her left side.  She also hit her forehead against a bar cart.  Denies any dizziness, syncope or loss of consciousness.  She had severe pain in her left hip without any radiation.  Denies any chest pain, dizziness, palpitations, shortness of breath, fevers, chills, nausea, vomiting, headache, blurred vision, abdominal pain, dysuria, diarrhea, muscle weakness, tingling or numbness.  She reports that 2 weeks back she fell in the bathroom while trying to put on her clothes and landed on her back.  Denied any loss of consciousness at that time.  At baseline she uses a  walker (not very frequently).  Denies any recent illness or sick contact.  Denies any change in her medications.  In the ED her blood pressure was elevated with systolic in the 190s.  Afebrile.  Blood work showed WC of 11.5k, BUN of 26 and creatinine of 1.39 which is at baseline.  CBG was normal.  A CT of the head and cervical spine was negative for any injury.  A CT chest without contrast was done which showed superior endplate compression deformity of T2 and T3 (acute versus subacute).  A CT of the left hip done showed minimally displaced fracture of the proximal tip of the left greater trochanter. Orthopedic surgeon on-call was consulted by ED physician who recommended nonoperative management with pain control and PT evaluation and some weightbearing. Patient went to the bathroom in the ED and while sitting in the commode had a near syncopal episode.  EKG done showed normal sinus rhythm with PVCs.  Hospitalist consulted for observation on telemetry with pain control and PT evaluation.     Hospital course  Principal Problem:   Greater trochanter fracture (HCC) Minimally displaced.  ED physician spoke with orthopedic consult on call (Dr. Odis Luster) who recommended nonoperative management with pain control and PT evaluation. Placed on observation.  Pain control with as needed oxycodone and added bowel regimen.  Continue Zanaflex for her back spasms.  Can you schedule Tylenol 3-4 times a day if needed.  Had recent fall landing on her back 2 weeks ago.  X-ray of the lumbar spine during that time was negative. Seen by PT and  recommends home health with supervision for mobility.  Patient was able to ambulate about 15 feet with rolling walker.  Spoke with her son and confirmed that he is available 24/7 at home and they also have a ramp entrance.  We will arrange home health PT, OT and RN.  Patient has appointment to see orthopedic surgeon Dr. Odis LusterBowers on 12/29.  Active Problems: Near syncope Suspect  this is vasovagal.  EKG without ischemic changes or QT prolongation.  Show some PVCs. Electrolytes normal and patient stable on telemetry.    Essential hypertension/accelerated hypertension Patient on 5 different antihypertensives.  Blood pressure initially elevated likely due to pain.  Her diastolic blood pressure is soft so I am going to discontinue her amlodipine since it will interact with her statin as well.  Monitor blood pressure as outpatient.   Diabetes mellitus type 2, controlled Resume gabapentin.  Lumbar stenosis with neurogenic claudication .  Patient reports taking Zanaflex as needed.  Resumed.    CKD (chronic kidney disease) stage 3, GFR 30-59 ml/min Renal function seems at baseline.  Monitor.  CAD with history of MI Aspirin and statin   Family communication: Spoke with sons on the phone   Procedure: CT head  Disposition: Home Discharge Instructions   Allergies as of 03/31/2019      Reactions   Butenafine Swelling   Tongue swelled and turned red   Levofloxacin    Other reaction(s): Other (See Comments) Tongue swelled and turned red   Limonene Other (See Comments)   Other reaction(s): Other (See Comments) Tongue swelled and turned red Tongue swelled and turned red   Terbinafine And Related Other (See Comments)   Tongue swelled and turned red      Medication List    STOP taking these medications   amLODipine 5 MG tablet Commonly known as: NORVASC     TAKE these medications   acetaminophen 500 MG tablet Commonly known as: TYLENOL Take 1,000 mg by mouth every 6 (six) hours as needed. 1-2 tabs daily as needed For pain   aspirin EC 81 MG tablet Take 81 mg by mouth daily.   cloNIDine 0.2 mg/24hr patch Commonly known as: CATAPRES - Dosed in mg/24 hr Place 0.2 mg onto the skin once a week.   clopidogrel 75 MG tablet Commonly known as: PLAVIX Take 75 mg by mouth daily.   gabapentin 100 MG capsule Commonly known as: NEURONTIN Take 100 mg by  mouth 2 (two) times daily.   glimepiride 1 MG tablet Commonly known as: AMARYL Take 1 mg by mouth daily with breakfast.   isosorbide mononitrate 60 MG 24 hr tablet Commonly known as: IMDUR Take 60 mg by mouth daily.   magnesium oxide 400 MG tablet Commonly known as: MAG-OX Take 400 mg by mouth daily.   nitroGLYCERIN 0.4 MG SL tablet Commonly known as: NITROSTAT Place 0.4 mg under the tongue every 5 (five) minutes as needed. For chest pain   omeprazole 40 MG capsule Commonly known as: PRILOSEC Take 40 mg by mouth daily.   oxyCODONE 5 MG immediate release tablet Commonly known as: Oxy IR/ROXICODONE Take 1 tablet (5 mg total) by mouth every 4 (four) hours as needed for breakthrough pain ((for MODERATE breakthrough pain)).   polyethylene glycol 17 g packet Commonly known as: MiraLax Take 17 g by mouth daily as needed for mild constipation.   rosuvastatin 20 MG tablet Commonly known as: CRESTOR Take 20 mg by mouth daily.   senna-docusate 8.6-50 MG tablet Commonly known as:  Senokot-S Take 1 tablet by mouth 2 (two) times daily.   spironolactone 25 MG tablet Commonly known as: ALDACTONE Take 25 mg by mouth daily.   timolol 0.5 % ophthalmic solution Commonly known as: BETIMOL Place 1 drop into both eyes every evening.   tizanidine 2 MG capsule Commonly known as: ZANAFLEX Take 2 mg by mouth 3 (three) times daily as needed for muscle spasms.   torsemide 10 MG tablet Commonly known as: DEMADEX Take 10 mg by mouth every evening.      Follow-up Information    Lyndle Herrlich, MD. Schedule an appointment as soon as possible for a visit on 04/18/2019.   Specialty: Orthopedic Surgery Why: for follow up; @ 9:00 am Contact information: [address] [place] [address] [phone]        Danella Penton, MD. Schedule an appointment as soon as possible for a visit in 1 week(s).   Specialty: Internal Medicine Contact information: [facility] [address] [place] [address] [phone]          Allergies  Allergen Reactions  . Butenafine Swelling    Tongue swelled and turned red  . Levofloxacin     Other reaction(s): Other (See Comments) Tongue swelled and turned red  . Limonene Other (See Comments)    Other reaction(s): Other (See Comments) Tongue swelled and turned red Tongue swelled and turned red   . Terbinafine And Related Other (See Comments)    Tongue swelled and turned red      Procedures/Studies: DG Chest 2 View  Result Date: 03/15/2019 CLINICAL DATA:  Recent slip and fall with chest pain, initial encounter EXAM: CHEST - 2 VIEW COMPARISON:  08/13/2017 FINDINGS: Cardiac shadows within normal limits. Aortic calcifications are noted. The lungs are mildly hyperinflated without focal infiltrate or sizable effusion. No compression deformities are seen. No definitive rib fractures are noted. IMPRESSION: Mild COPD without acute abnormality. Electronically Signed   By: Alcide Clever M.D.   On: 03/15/2019 19:14   DG Thoracic Spine 2 View  Result Date: 03/15/2019 CLINICAL DATA:  Recent slip and fall with upper back pain, initial encounter EXAM: THORACIC SPINE 2 VIEWS COMPARISON:  None. FINDINGS: Vertebral body height is well maintained. Mild osteophytic changes are seen. No pedicle abnormality or paraspinal mass is seen. No rib abnormality is noted. IMPRESSION: Mild degenerative change without acute abnormality. Electronically Signed   By: Alcide Clever M.D.   On: 03/15/2019 19:15   DG Lumbar Spine 2-3 Views  Result Date: 03/15/2019 CLINICAL DATA:  Recent slip and fall with low back pain, initial encounter EXAM: LUMBAR SPINE - 3 VIEW COMPARISON:  02/17/2015 FINDINGS: Five lumbar type vertebral bodies are well visualized. Vertebral body height is well maintained. Mild anterolisthesis of L4 on L5 and L5 on S1 is noted stable in appearance from the prior exam. Osteophytic changes are noted most prominent at L1-2  anteriorly increased from the prior study. Mild retrolisthesis of L2 on L3 is seen. No acute soft tissue abnormality is noted. Diffuse aortic calcifications are seen. IMPRESSION: Multilevel degenerative change progressed from the prior exam. No acute abnormality noted. Electronically Signed   By: Alcide Clever M.D.   On: 03/15/2019 19:10   CT Head Wo Contrast  Result Date: 03/30/2019 CLINICAL DATA:  Head injury after fall. No loss of consciousness. EXAM: CT HEAD WITHOUT CONTRAST CT CERVICAL SPINE WITHOUT CONTRAST TECHNIQUE: Multidetector CT imaging of the head and cervical spine was performed following the standard protocol without  intravenous contrast. Multiplanar CT image reconstructions of the cervical spine were also generated. COMPARISON:  March 15, 2019. FINDINGS: CT HEAD FINDINGS Brain: Mild diffuse cortical atrophy is noted. Mild chronic ischemic white matter disease is noted. No mass effect or midline shift is noted. Ventricular size is within normal limits. There is no evidence of mass lesion, hemorrhage or acute infarction. Vascular: No hyperdense vessel or unexpected calcification. Skull: Normal. Negative for fracture or focal lesion. Sinuses/Orbits: No acute finding. Other: None. CT CERVICAL SPINE FINDINGS Alignment: Mild grade 1 anterolisthesis of C4-5 is noted secondary to posterior facet joint hypertrophy. Skull base and vertebrae: No acute fracture. No primary bone lesion or focal pathologic process. Soft tissues and spinal canal: No prevertebral fluid or swelling. No visible canal hematoma. Disc levels: Severe degenerative disc disease is noted at C3-4, C5-6 and C6-7 with anterior osteophyte formation. Upper chest: Negative. Other: Severe hypertrophy of the right-sided posterior facet joints is noted at C2-3 and C3-4. IMPRESSION: 1. Mild diffuse cortical atrophy. Mild chronic ischemic white matter disease. No acute intracranial abnormality seen. 2. Severe multilevel degenerative disc  disease. No acute abnormality seen in the cervical spine. Electronically Signed   By: Lupita RaiderJames  Green Jr M.D.   On: 03/30/2019 14:53   CT Head Wo Contrast  Result Date: 03/15/2019 CLINICAL DATA:  Slip and fall getting up from the commode striking head on door. Headache. EXAM: CT HEAD WITHOUT CONTRAST CT CERVICAL SPINE WITHOUT CONTRAST TECHNIQUE: Multidetector CT imaging of the head and cervical spine was performed following the standard protocol without intravenous contrast. Multiplanar CT image reconstructions of the cervical spine were also generated. COMPARISON:  Head CT 08/13/2017 FINDINGS: CT HEAD FINDINGS Brain: No intracranial hemorrhage, mass effect, or midline shift. Stable age related atrophy. Mild chronic small vessel ischemia. No hydrocephalus. The basilar cisterns are patent. No evidence of territorial infarct or acute ischemia. No extra-axial or intracranial fluid collection. Vascular: Atherosclerosis of skullbase vasculature without hyperdense vessel or abnormal calcification. Skull: No fracture or focal lesion. Sinuses/Orbits: Paranasal sinuses and mastoid air cells are clear. The visualized orbits are unremarkable. Other: None. CT CERVICAL SPINE FINDINGS Alignment: 3 mm anterolisthesis of C4 on C5 is likely degenerative and facet mediated. No evidence of traumatic subluxation. Skull base and vertebrae: No acute fracture. Vertebral body heights are maintained. The dens and skull base are intact. Soft tissues and spinal canal: No prevertebral fluid or swelling. No visible canal hematoma. Disc levels: Diffuse degenerative disc disease, most prominent at C5-C6. Multilevel facet hypertrophy. Upper chest: No acute findings. Other: Advanced carotid calcifications. IMPRESSION: 1. No acute intracranial abnormality. No skull fracture. Stable age related atrophy and chronic small vessel ischemia. 2. Multilevel degenerative change throughout the cervical spine without acute fracture or subluxation. 3. Carotid  and skullbase atherosclerosis. Electronically Signed   By: Narda RutherfordMelanie  Sanford M.D.   On: 03/15/2019 18:57   CT Chest Wo Contrast  Result Date: 03/30/2019 CLINICAL DATA:  Chest pain after fall EXAM: CT CHEST WITHOUT CONTRAST TECHNIQUE: Multidetector CT imaging of the chest was performed following the standard protocol without IV contrast. COMPARISON:  CT 10/25/2006 FINDINGS: Cardiovascular: Heart size is normal. No pericardial effusion. Thoracic aorta is nonaneurysmal. There are atherosclerotic calcifications of the aorta and coronary arteries. Main pulmonary trunk is nondilated. Mediastinum/Nodes: No enlarged mediastinal or axillary lymph nodes. Thyroid gland, trachea, and esophagus demonstrate no significant findings. Lungs/Pleura: Lungs are clear. No pleural effusion or pneumothorax. Upper Abdomen: No acute abnormality. Musculoskeletal: Subtle superior endplate compression deformities of T2 and T3  with less than 25% height loss, which appear acute to subacute. No bony retropulsion. No displaced rib fracture. IMPRESSION: 1. Subtle superior endplate compression deformities of T2 and T3, which appear acute to subacute. 2. Otherwise, no acute findings within the chest. 3. Coronary artery and aortic atherosclerosis. Aortic Atherosclerosis (ICD10-I70.0). Electronically Signed   By: Davina Poke M.D.   On: 03/30/2019 14:57   CT Cervical Spine Wo Contrast  Result Date: 03/30/2019 CLINICAL DATA:  Head injury after fall. No loss of consciousness. EXAM: CT HEAD WITHOUT CONTRAST CT CERVICAL SPINE WITHOUT CONTRAST TECHNIQUE: Multidetector CT imaging of the head and cervical spine was performed following the standard protocol without intravenous contrast. Multiplanar CT image reconstructions of the cervical spine were also generated. COMPARISON:  March 15, 2019. FINDINGS: CT HEAD FINDINGS Brain: Mild diffuse cortical atrophy is noted. Mild chronic ischemic white matter disease is noted. No mass effect or midline  shift is noted. Ventricular size is within normal limits. There is no evidence of mass lesion, hemorrhage or acute infarction. Vascular: No hyperdense vessel or unexpected calcification. Skull: Normal. Negative for fracture or focal lesion. Sinuses/Orbits: No acute finding. Other: None. CT CERVICAL SPINE FINDINGS Alignment: Mild grade 1 anterolisthesis of C4-5 is noted secondary to posterior facet joint hypertrophy. Skull base and vertebrae: No acute fracture. No primary bone lesion or focal pathologic process. Soft tissues and spinal canal: No prevertebral fluid or swelling. No visible canal hematoma. Disc levels: Severe degenerative disc disease is noted at C3-4, C5-6 and C6-7 with anterior osteophyte formation. Upper chest: Negative. Other: Severe hypertrophy of the right-sided posterior facet joints is noted at C2-3 and C3-4. IMPRESSION: 1. Mild diffuse cortical atrophy. Mild chronic ischemic white matter disease. No acute intracranial abnormality seen. 2. Severe multilevel degenerative disc disease. No acute abnormality seen in the cervical spine. Electronically Signed   By: Marijo Conception M.D.   On: 03/30/2019 14:53   CT Cervical Spine Wo Contrast  Result Date: 03/15/2019 CLINICAL DATA:  Slip and fall getting up from the commode striking head on door. Headache. EXAM: CT HEAD WITHOUT CONTRAST CT CERVICAL SPINE WITHOUT CONTRAST TECHNIQUE: Multidetector CT imaging of the head and cervical spine was performed following the standard protocol without intravenous contrast. Multiplanar CT image reconstructions of the cervical spine were also generated. COMPARISON:  Head CT 08/13/2017 FINDINGS: CT HEAD FINDINGS Brain: No intracranial hemorrhage, mass effect, or midline shift. Stable age related atrophy. Mild chronic small vessel ischemia. No hydrocephalus. The basilar cisterns are patent. No evidence of territorial infarct or acute ischemia. No extra-axial or intracranial fluid collection. Vascular:  Atherosclerosis of skullbase vasculature without hyperdense vessel or abnormal calcification. Skull: No fracture or focal lesion. Sinuses/Orbits: Paranasal sinuses and mastoid air cells are clear. The visualized orbits are unremarkable. Other: None. CT CERVICAL SPINE FINDINGS Alignment: 3 mm anterolisthesis of C4 on C5 is likely degenerative and facet mediated. No evidence of traumatic subluxation. Skull base and vertebrae: No acute fracture. Vertebral body heights are maintained. The dens and skull base are intact. Soft tissues and spinal canal: No prevertebral fluid or swelling. No visible canal hematoma. Disc levels: Diffuse degenerative disc disease, most prominent at C5-C6. Multilevel facet hypertrophy. Upper chest: No acute findings. Other: Advanced carotid calcifications. IMPRESSION: 1. No acute intracranial abnormality. No skull fracture. Stable age related atrophy and chronic small vessel ischemia. 2. Multilevel degenerative change throughout the cervical spine without acute fracture or subluxation. 3. Carotid and skullbase atherosclerosis. Electronically Signed   By: Aurther Loft.D.  On: 03/15/2019 18:57   CT Hip Left Wo Contrast  Result Date: 03/30/2019 CLINICAL DATA:  Left hip pain secondary to a fall today. Negative radiographs. EXAM: CT OF THE LEFT HIP WITHOUT CONTRAST TECHNIQUE: Multidetector CT imaging of the left hip was performed according to the standard protocol. Multiplanar CT image reconstructions were also generated. COMPARISON:  None. FINDINGS: Bones/Joint/Cartilage There is a minimally displaced fracture of the proximal tip of the left greater trochanter. The remainder of the proximal femur is intact. The visualized pelvic bones are intact. Muscles and Tendons Normal. Soft tissues Sigmoid diverticulosis. IMPRESSION: Minimally displaced fracture of the proximal tip of the left greater trochanter. Electronically Signed   By: Francene Boyers M.D.   On: 03/30/2019 15:29   DG Hand  Complete Left  Result Date: 03/15/2019 CLINICAL DATA:  Left hand pain following slip and fall, initial encounter EXAM: LEFT HAND - COMPLETE 3+ VIEW COMPARISON:  None. FINDINGS: There is a mildly displaced fracture through the midportion of the fifth proximal phalanx. Degenerative changes of the interphalangeal joints and first CMC joint are seen. No other fracture is noted. IMPRESSION: Fracture of the fifth proximal phalanx without significant displacement. Mild degenerative changes are noted. Electronically Signed   By: Alcide Clever M.D.   On: 03/15/2019 19:14   DG Hip Unilat With Pelvis 2-3 Views Left  Result Date: 03/30/2019 CLINICAL DATA:  Left hip pain after fall today.  Initial encounter. EXAM: DG HIP (WITH OR WITHOUT PELVIS) 2-3V LEFT COMPARISON:  None. FINDINGS: There is no evidence of hip fracture or dislocation. There is no evidence of arthropathy or other focal bone abnormality. IMPRESSION: Negative exam. Electronically Signed   By: Drusilla Kanner M.D.   On: 03/30/2019 14:52       Subjective: Denies any hip pain this morning.  No overnight events.  Discharge Exam: Vitals:   03/31/19 0017 03/31/19 0848  BP: (!) 152/41 (!) 152/48  Pulse: 75 79  Resp: 19 16  Temp: (!) 97.2 F (36.2 C) 97.8 F (36.6 C)  SpO2: 100% 95%   Vitals:   03/30/19 2037 03/30/19 2042 03/31/19 0017 03/31/19 0848  BP: (!) 163/53 (!) 154/58 (!) 152/41 (!) 152/48  Pulse: 87 83 75 79  Resp: 18  19 16   Temp: 98.4 F (36.9 C)  (!) 97.2 F (36.2 C) 97.8 F (36.6 C)  TempSrc: Oral  Oral   SpO2: 97%  100% 95%  Weight:      Height:        General: Elderly female not in distress HEENT: Moist mucosa, supple neck, left scalp hematoma Chest: Clear bilaterally CVs: Normal S1-S2 GI: Soft, nondistended, nontender Musculoskeletal: Limited mobility of the left hip     The results of significant diagnostics from this hospitalization (including imaging, microbiology, ancillary and laboratory) are listed  below for reference.     Microbiology: Recent Results (from the past 240 hour(s))  SARS CORONAVIRUS 2 (TAT 6-24 HRS) Nasopharyngeal Nasopharyngeal Swab     Status: None   Collection Time: 03/30/19  5:28 PM   Specimen: Nasopharyngeal Swab  Result Value Ref Range Status   SARS Coronavirus 2 NEGATIVE NEGATIVE Final    Comment: (NOTE) SARS-CoV-2 target nucleic acids are NOT DETECTED. The SARS-CoV-2 RNA is generally detectable in upper and lower respiratory specimens during the acute phase of infection. Negative results do not preclude SARS-CoV-2 infection, do not rule out co-infections with other pathogens, and should not be used as the sole basis for treatment or other patient management  decisions. Negative results must be combined with clinical observations, patient history, and epidemiological information. The expected result is Negative. Fact Sheet for Patients: HairSlick.no Fact Sheet for Healthcare Providers: quierodirigir.com This test is not yet approved or cleared by the [place] FDA and  has been authorized for detection and/or diagnosis of SARS-CoV-2 by FDA under an Emergency Use Authorization (EUA). This EUA will remain  in effect (meaning this test can be used) for the duration of the COVID-19 declaration under Section 56 4(b)(1) of the Act, 21 U.S.C. section 360bbb-3(b)(1), unless the authorization is terminated or revoked sooner. Performed at [facility], [address], [place], [place] [address]      Labs: BNP (last 3 results) No results for input(s): BNP in the last 8760 hours. Basic Metabolic Panel: Recent Labs  Lab 03/30/19 1421  NA 140  K 3.9  CL 101  CO2 29  GLUCOSE 99  BUN 26*  CREATININE 1.39*  CALCIUM 9.8   Liver Function Tests: No results for input(s): AST, ALT, ALKPHOS, BILITOT, PROT, ALBUMIN in the last 168 hours. No results for input(s): LIPASE, AMYLASE in the last 168  hours. No results for input(s): AMMONIA in the last 168 hours. CBC: Recent Labs  Lab 03/30/19 1421  WBC 11.5*  HGB 12.9  HCT 37.8  MCV 93.6  PLT 396   Cardiac Enzymes: No results for input(s): CKTOTAL, CKMB, CKMBINDEX, TROPONINI in the last 168 hours. BNP: Invalid input(s): POCBNP CBG: Recent Labs  Lab 03/30/19 1705 03/30/19 2244 03/31/19 0915 03/31/19 1212  GLUCAP 96 92 71 147*   D-Dimer No results for input(s): DDIMER in the last 72 hours. Hgb A1c No results for input(s): HGBA1C in the last 72 hours. Lipid Profile No results for input(s): CHOL, HDL, LDLCALC, TRIG, CHOLHDL, LDLDIRECT in the last 72 hours. Thyroid function studies No results for input(s): TSH, T4TOTAL, T3FREE, THYROIDAB in the last 72 hours.  Invalid input(s): FREET3 Anemia work up No results for input(s): VITAMINB12, FOLATE, FERRITIN, TIBC, IRON, RETICCTPCT in the last 72 hours. Urinalysis    Component Value Date/Time   COLORURINE COLORLESS (A) 03/30/2019 1421   APPEARANCEUR CLEAR (A) 03/30/2019 1421   APPEARANCEUR CLEAR 08/15/2012 1031   LABSPEC 1.006 03/30/2019 1421   LABSPEC 1.015 08/15/2012 1031   PHURINE 7.0 03/30/2019 1421   GLUCOSEU NEGATIVE 03/30/2019 1421   GLUCOSEU 300 mg/dL 60/45/4098 1191   HGBUR NEGATIVE 03/30/2019 1421   BILIRUBINUR NEGATIVE 03/30/2019 1421   BILIRUBINUR NEGATIVE 08/15/2012 1031   KETONESUR NEGATIVE 03/30/2019 1421   PROTEINUR NEGATIVE 03/30/2019 1421   NITRITE NEGATIVE 03/30/2019 1421   LEUKOCYTESUR TRACE (A) 03/30/2019 1421   LEUKOCYTESUR NEGATIVE 08/15/2012 1031   Sepsis Labs Invalid input(s): PROCALCITONIN,  WBC,  LACTICIDVEN Microbiology Recent Results (from the past 240 hour(s))  SARS CORONAVIRUS 2 (TAT 6-24 HRS) Nasopharyngeal Nasopharyngeal Swab     Status: None   Collection Time: 03/30/19  5:28 PM   Specimen: Nasopharyngeal Swab  Result Value Ref Range Status   SARS Coronavirus 2 NEGATIVE NEGATIVE Final    Comment: (NOTE) SARS-CoV-2 target  nucleic acids are NOT DETECTED. The SARS-CoV-2 RNA is generally detectable in upper and lower respiratory specimens during the acute phase of infection. Negative results do not preclude SARS-CoV-2 infection, do not rule out co-infections with other pathogens, and should not be used as the sole basis for treatment or other patient management decisions. Negative results must be combined with clinical observations, patient history, and epidemiological information. The expected result is Negative.  Fact Sheet for Patients: HairSlick.no Fact Sheet for Healthcare Providers: quierodirigir.com This test is not yet approved or cleared by the [place] FDA and  has been authorized for detection and/or diagnosis of SARS-CoV-2 by FDA under an Emergency Use Authorization (EUA). This EUA will remain  in effect (meaning this test can be used) for the duration of the COVID-19 declaration under Section 56 4(b)(1) of the Act, 21 U.S.C. section 360bbb-3(b)(1), unless the authorization is terminated or revoked sooner. Performed at [facility], [address], [place], [place] [address]      Time coordinating discharge: 25 minutes  SIGNED:   Eddie North, MD  [facility] 03/31/2019, 3:33 PM Pager   If 7PM-7AM, please contact night-coverage [email or web address] Password [email or web address]

## 2019-08-21 DIAGNOSIS — F0390 Unspecified dementia without behavioral disturbance: Secondary | ICD-10-CM | POA: Insufficient documentation

## 2019-08-21 DIAGNOSIS — F03A0 Unspecified dementia, mild, without behavioral disturbance, psychotic disturbance, mood disturbance, and anxiety: Secondary | ICD-10-CM | POA: Insufficient documentation

## 2019-11-17 ENCOUNTER — Emergency Department: Payer: Medicare Other

## 2019-11-17 ENCOUNTER — Encounter: Payer: Self-pay | Admitting: Radiology

## 2019-11-17 ENCOUNTER — Inpatient Hospital Stay
Admission: EM | Admit: 2019-11-17 | Discharge: 2019-11-21 | DRG: 280 | Disposition: A | Discharge status: Home Health | Payer: Medicare Other | Attending: Internal Medicine | Admitting: Internal Medicine

## 2019-11-17 ENCOUNTER — Other Ambulatory Visit: Payer: Self-pay

## 2019-11-17 DIAGNOSIS — E1122 Type 2 diabetes mellitus with diabetic chronic kidney disease: Secondary | ICD-10-CM | POA: Diagnosis present

## 2019-11-17 DIAGNOSIS — I129 Hypertensive chronic kidney disease with stage 1 through stage 4 chronic kidney disease, or unspecified chronic kidney disease: Secondary | ICD-10-CM | POA: Diagnosis present

## 2019-11-17 DIAGNOSIS — E785 Hyperlipidemia, unspecified: Secondary | ICD-10-CM | POA: Diagnosis present

## 2019-11-17 DIAGNOSIS — I252 Old myocardial infarction: Secondary | ICD-10-CM

## 2019-11-17 DIAGNOSIS — Z7984 Long term (current) use of oral hypoglycemic drugs: Secondary | ICD-10-CM

## 2019-11-17 DIAGNOSIS — E782 Mixed hyperlipidemia: Secondary | ICD-10-CM | POA: Diagnosis present

## 2019-11-17 DIAGNOSIS — I2510 Atherosclerotic heart disease of native coronary artery without angina pectoris: Secondary | ICD-10-CM | POA: Diagnosis present

## 2019-11-17 DIAGNOSIS — I447 Left bundle-branch block, unspecified: Secondary | ICD-10-CM | POA: Diagnosis present

## 2019-11-17 DIAGNOSIS — N1832 Chronic kidney disease, stage 3b: Secondary | ICD-10-CM | POA: Diagnosis present

## 2019-11-17 DIAGNOSIS — I214 Non-ST elevation (NSTEMI) myocardial infarction: Principal | ICD-10-CM | POA: Diagnosis present

## 2019-11-17 DIAGNOSIS — Z955 Presence of coronary angioplasty implant and graft: Secondary | ICD-10-CM | POA: Diagnosis not present

## 2019-11-17 DIAGNOSIS — Z7982 Long term (current) use of aspirin: Secondary | ICD-10-CM | POA: Diagnosis not present

## 2019-11-17 DIAGNOSIS — J1282 Pneumonia due to coronavirus disease 2019: Secondary | ICD-10-CM | POA: Diagnosis present

## 2019-11-17 DIAGNOSIS — F0390 Unspecified dementia without behavioral disturbance: Secondary | ICD-10-CM | POA: Diagnosis present

## 2019-11-17 DIAGNOSIS — Z7902 Long term (current) use of antithrombotics/antiplatelets: Secondary | ICD-10-CM | POA: Diagnosis not present

## 2019-11-17 DIAGNOSIS — I10 Essential (primary) hypertension: Secondary | ICD-10-CM | POA: Diagnosis present

## 2019-11-17 DIAGNOSIS — U071 COVID-19: Secondary | ICD-10-CM | POA: Diagnosis present

## 2019-11-17 DIAGNOSIS — K219 Gastro-esophageal reflux disease without esophagitis: Secondary | ICD-10-CM | POA: Diagnosis present

## 2019-11-17 DIAGNOSIS — N183 Chronic kidney disease, stage 3 unspecified: Secondary | ICD-10-CM | POA: Diagnosis present

## 2019-11-17 DIAGNOSIS — E119 Type 2 diabetes mellitus without complications: Secondary | ICD-10-CM

## 2019-11-17 DIAGNOSIS — R079 Chest pain, unspecified: Secondary | ICD-10-CM

## 2019-11-17 DIAGNOSIS — R778 Other specified abnormalities of plasma proteins: Secondary | ICD-10-CM | POA: Diagnosis present

## 2019-11-17 LAB — CBC WITH DIFFERENTIAL/PLATELET
Abs Immature Granulocytes: 0.07 10*3/uL (ref 0.00–0.07)
Basophils Absolute: 0.1 10*3/uL (ref 0.0–0.1)
Basophils Relative: 0 %
Eosinophils Absolute: 0 10*3/uL (ref 0.0–0.5)
Eosinophils Relative: 0 %
HCT: 32.7 % — ABNORMAL LOW (ref 36.0–46.0)
Hemoglobin: 11 g/dL — ABNORMAL LOW (ref 12.0–15.0)
Immature Granulocytes: 1 %
Lymphocytes Relative: 3 %
Lymphs Abs: 0.5 10*3/uL — ABNORMAL LOW (ref 0.7–4.0)
MCH: 32.8 pg (ref 26.0–34.0)
MCHC: 33.6 g/dL (ref 30.0–36.0)
MCV: 97.6 fL (ref 80.0–100.0)
Monocytes Absolute: 0.3 10*3/uL (ref 0.1–1.0)
Monocytes Relative: 2 %
Neutro Abs: 13.4 10*3/uL — ABNORMAL HIGH (ref 1.7–7.7)
Neutrophils Relative %: 94 %
Platelets: 289 10*3/uL (ref 150–400)
RBC: 3.35 MIL/uL — ABNORMAL LOW (ref 3.87–5.11)
RDW: 14 % (ref 11.5–15.5)
WBC: 14.3 10*3/uL — ABNORMAL HIGH (ref 4.0–10.5)
nRBC: 0 % (ref 0.0–0.2)

## 2019-11-17 LAB — BASIC METABOLIC PANEL
Anion gap: 12 (ref 5–15)
BUN: 26 mg/dL — ABNORMAL HIGH (ref 8–23)
CO2: 23 mmol/L (ref 22–32)
Calcium: 8.8 mg/dL — ABNORMAL LOW (ref 8.9–10.3)
Chloride: 99 mmol/L (ref 98–111)
Creatinine, Ser: 1.48 mg/dL — ABNORMAL HIGH (ref 0.44–1.00)
GFR calc Af Amer: 37 mL/min — ABNORMAL LOW (ref >60)
GFR calc non Af Amer: 32 mL/min — ABNORMAL LOW (ref >60)
Glucose, Bld: 240 mg/dL — ABNORMAL HIGH (ref 70–99)
Potassium: 3.8 mmol/L (ref 3.5–5.1)
Sodium: 134 mmol/L — ABNORMAL LOW (ref 135–145)

## 2019-11-17 LAB — TROPONIN I (HIGH SENSITIVITY)
Troponin I (High Sensitivity): 138 ng/L (ref <18)
Troponin I (High Sensitivity): 997 ng/L (ref <18)

## 2019-11-17 LAB — PROTIME-INR
INR: 1 (ref 0.8–1.2)
Prothrombin Time: 13 seconds (ref 11.4–15.2)

## 2019-11-17 LAB — APTT: aPTT: 38 seconds — ABNORMAL HIGH (ref 24–36)

## 2019-11-17 MED ORDER — HEPARIN (PORCINE) 25000 UT/250ML-% IV SOLN
700.0000 [IU]/h | INTRAVENOUS | Status: DC
  Administered 2019-11-17: 800 [IU]/h via INTRAVENOUS
  Administered 2019-11-19: 600 [IU]/h via INTRAVENOUS
  Filled 2019-11-17 (×2): qty 250

## 2019-11-17 MED ORDER — IOHEXOL 350 MG/ML SOLN
60.0000 mL | Freq: Once | INTRAVENOUS | Status: AC | PRN
  Administered 2019-11-17: 60 mL via INTRAVENOUS

## 2019-11-17 MED ORDER — HEPARIN BOLUS VIA INFUSION
3400.0000 [IU] | Freq: Once | INTRAVENOUS | Status: AC
  Administered 2019-11-17: 3400 [IU] via INTRAVENOUS
  Filled 2019-11-17: qty 3400

## 2019-11-17 NOTE — ED Notes (Signed)
Date and time results received: 11/17/19 2019 (use smartphrase ".now" to insert current time)  Test: Troponin Critical Value: 138  Name of Provider Notified: Dr. Derrill Kay  Orders Received? Or Actions Taken?: Provider notified

## 2019-11-17 NOTE — H&P (Signed)
History and Physical   [name] TZG:[id number] DOB: Dec 07, 1930 DOA: 11/17/2019  Referring MD/NP/PA: Dr. Derrill Kay  PCP: Danella Penton, MD   Outpatient Specialists: Dr. Gwen Pounds  Patient coming from: Home  Chief Complaint: Chest pain  HPI: [name] is a 86 y.o. female with medical history significant of coronary artery disease, diabetes, hypertension, status post previous stent, hyperlipidemia who was recently diagnosed with COVID-19 infection about 4 days ago.  Patient has received the Pfizer vaccine.  She came in with substernal chest pain.  Associated with some cough and shortness of breath.  Patient went to get IVIG today and was having significant chest pain.  Chest pain was rated a 7 out of 10 substernal more pressure-like.  Similar to her previous chest pain when she had MI.  It was relieved mildly by nitroglycerin and morphine in the ER.  She denies any fever or chills.  Not hypoxic.  She has remained asymptomatic with her COVID-19 except for generalized aches and malaise.  Patient's troponin was noted in the ER to initially be above 100 and now about 1000 and the second 1.  EKG also showed new bundle branch block so patient is being admitted with non-ST elevation MI..  ED Course: Temperature 99.8 blood pressure 140/51 pulse 97 respirate 25 oxygen sats 93% on room air.  White count is 14.3 hemoglobin 11.0 with platelets 289.  Sodium 134 potassium 3.8 chloride 99 CO2 is 23 BUN 26 creatinine 1.49 calcium 8.8 glucose 241 INR 1.0.  EKG showed new left bundle branch block.  Initial troponin was 138 and second troponin is 997.  Chest x-ray showed mild changes of bibasilar opacities consistent with COVID-19 infection.  CT angios of the chest showed no PE there was small focal patchy right the lung left base due to Covid infection.  Patient being admitted with non-ST elevation MI.  Review of Systems: As per HPI otherwise 10 point review of systems negative.    Past Medical History:    Diagnosis Date  . Hypercholesteremia   . Hypertension   . Myocardial infarction (HCC) 2008  . S/P angioplasty with stent     Past Surgical History:  Procedure Laterality Date  . ABDOMINAL HYSTERECTOMY    . APPENDECTOMY    . CORONARY ANGIOPLASTY WITH STENT PLACEMENT       reports that she has never smoked. She has never used smokeless tobacco. She reports that she does not drink alcohol and does not use drugs.  Allergies  Allergen Reactions  . Butenafine Swelling    Tongue swelled and turned red  . Levofloxacin     Other reaction(s): Other (See Comments) Tongue swelled and turned red  . Limonene Other (See Comments)    Other reaction(s): Other (See Comments) Tongue swelled and turned red Tongue swelled and turned red   . Terbinafine And Related Other (See Comments)    Tongue swelled and turned red    Family History  Problem Relation Age of Onset  . Breast cancer Maternal Aunt      Prior to Admission medications   Medication Sig Start Date End Date Taking? Authorizing Provider  acetaminophen (TYLENOL) 500 MG tablet Take 1,000 mg by mouth every 6 (six) hours as needed. 1-2 tabs daily as needed For pain   Yes [provider]  amLODipine (NORVASC) 5 MG tablet Take 5 mg by mouth daily. 11/14/19  Yes [provider]  aspirin EC 81 MG tablet Take 81 mg by mouth daily.  Yes [provider]  cloNIDine (CATAPRES - DOSED IN MG/24 HR) 0.1 mg/24hr patch 0.1 mg once a week. 11/14/19  Yes [provider]  clopidogrel (PLAVIX) 75 MG tablet Take 75 mg by mouth daily.   Yes [provider]  colchicine 0.6 MG tablet Take 0.6 mg by mouth daily. 11/16/19  Yes [provider]  donepezil (ARICEPT) 5 MG tablet Take 5 mg by mouth daily. 11/14/19  Yes [provider]  doxycycline (VIBRAMYCIN) 100 MG capsule Take 100 mg by mouth 2 (two) times daily. 11/16/19  Yes [provider]  fluticasone (FLONASE) 50 MCG/ACT nasal spray  Place 2 sprays into both nostrils daily. 11/16/19  Yes [provider]  gabapentin (NEURONTIN) 100 MG capsule Take 100 mg by mouth 3 (three) times daily.  02/14/15  Yes [provider]  glimepiride (AMARYL) 1 MG tablet Take 1 mg by mouth daily with breakfast.  08/29/18 11/17/19 Yes [provider]  isosorbide mononitrate (IMDUR) 60 MG 24 hr tablet Take 60 mg by mouth daily.   Yes [provider]  magnesium oxide (MAG-OX) 400 MG tablet Take 400 mg by mouth daily.    Yes [provider]  Multiple Vitamins-Minerals (MULTIVITAMIN GUMMIES ADULT) CHEW Chew 1 tablet by mouth daily. 08/30/19  Yes [provider]  omeprazole (PRILOSEC) 20 MG capsule Take 20 mg by mouth daily.  11/22/18  Yes [provider]  polyethylene glycol (MIRALAX) 17 g packet Take 17 g by mouth daily as needed for mild constipation. 03/31/19  Yes Dhungel, Nishant, MD  predniSONE (DELTASONE) 20 MG tablet Take 20 mg by mouth daily. 11/16/19  Yes [provider]  rosuvastatin (CRESTOR) 20 MG tablet Take 20 mg by mouth daily.   Yes [provider]  spironolactone (ALDACTONE) 25 MG tablet Take 25 mg by mouth daily.   Yes [provider]  timolol (BETIMOL) 0.5 % ophthalmic solution Place 1 drop into both eyes every evening.   Yes [provider]  tizanidine (ZANAFLEX) 2 MG capsule Take 2 mg by mouth 3 (three) times daily as needed for muscle spasms.   Yes [provider]  torsemide (DEMADEX) 20 MG tablet Take 30 mg by mouth every evening.    Yes [provider]  nitroGLYCERIN (NITROSTAT) 0.4 MG SL tablet Place 0.4 mg under the tongue every 5 (five) minutes as needed. For chest pain    [provider]    Physical Exam: Vitals:   11/17/19 2100 11/17/19 2145 11/17/19 2200 11/17/19 2313  BP: (!) 132/50  (!) 121/50 (!) 128/45  Pulse: 81 82 77 75  Resp: 22 22 22 15   Temp:      TempSrc:      SpO2: 93% 94% 94% 95%  Weight:       Height:          Constitutional: Acutely ill looking, worried, And mildly anxious Vitals:   11/17/19 2100 11/17/19 2145 11/17/19 2200 11/17/19 2313  BP: (!) 132/50  (!) 121/50 (!) 128/45  Pulse: 81 82 77 75  Resp: 22 22 22 15   Temp:      TempSrc:      SpO2: 93% 94% 94% 95%  Weight:      Height:       Eyes: PERRL, lids and conjunctivae normal ENMT: Mucous membranes are moist. Posterior pharynx clear of any exudate or lesions.Normal dentition.  Neck: normal, supple, no masses, no thyromegaly Respiratory: Coarse breath sounds bilaterally, no wheezing, no crackles. Normal respiratory  effort. No accessory muscle use.  Cardiovascular: Regular rate and rhythm, no murmurs / rubs / gallops. No extremity edema. 2+ pedal pulses. No carotid bruits.  Abdomen: no tenderness, no masses palpated. No hepatosplenomegaly. Bowel sounds positive.  Musculoskeletal: no clubbing / cyanosis. No joint deformity upper and lower extremities. Good ROM, no contractures. Normal muscle tone.  Skin: no rashes, lesions, ulcers. No induration Neurologic: CN 2-12 grossly intact. Sensation intact, DTR normal. Strength 5/5 in all 4.  Psychiatric: Normal judgment and insight. Alert and oriented x 3. Normal mood.     Labs on Admission: I have personally reviewed following labs and imaging studies  CBC: Recent Labs  Lab 11/17/19 1936  WBC 14.3*  NEUTROABS 13.4*  HGB 11.0*  HCT 32.7*  MCV 97.6  PLT 289   Basic Metabolic Panel: Recent Labs  Lab 11/17/19 1936  NA 134*  K 3.8  CL 99  CO2 23  GLUCOSE 240*  BUN 26*  CREATININE 1.48*  CALCIUM 8.8*   GFR: Estimated Creatinine Clearance: 24.1 mL/min (A) (by C-G formula based on SCr of 1.48 mg/dL (H)). Liver Function Tests: No results for input(s): AST, ALT, ALKPHOS, BILITOT, PROT, ALBUMIN in the last 168 hours. No results for input(s): LIPASE, AMYLASE in the last 168 hours. No results for input(s): AMMONIA in the last 168 hours. Coagulation  Profile: Recent Labs  Lab 11/17/19 1936  INR 1.0   Cardiac Enzymes: No results for input(s): CKTOTAL, CKMB, CKMBINDEX, TROPONINI in the last 168 hours. BNP (last 3 results) No results for input(s): PROBNP in the last 8760 hours. HbA1C: No results for input(s): HGBA1C in the last 72 hours. CBG: No results for input(s): GLUCAP in the last 168 hours. Lipid Profile: No results for input(s): CHOL, HDL, LDLCALC, TRIG, CHOLHDL, LDLDIRECT in the last 72 hours. Thyroid Function Tests: No results for input(s): TSH, T4TOTAL, FREET4, T3FREE, THYROIDAB in the last 72 hours. Anemia Panel: No results for input(s): VITAMINB12, FOLATE, FERRITIN, TIBC, IRON, RETICCTPCT in the last 72 hours. Urine analysis:    Component Value Date/Time   COLORURINE COLORLESS (A) 03/30/2019 1421   APPEARANCEUR CLEAR (A) 03/30/2019 1421   APPEARANCEUR CLEAR 08/15/2012 1031   LABSPEC 1.006 03/30/2019 1421   LABSPEC 1.015 08/15/2012 1031   PHURINE 7.0 03/30/2019 1421   GLUCOSEU NEGATIVE 03/30/2019 1421   GLUCOSEU 300 mg/dL 70/17/7939 0300   HGBUR NEGATIVE 03/30/2019 1421   BILIRUBINUR NEGATIVE 03/30/2019 1421   BILIRUBINUR NEGATIVE 08/15/2012 1031   KETONESUR NEGATIVE 03/30/2019 1421   PROTEINUR NEGATIVE 03/30/2019 1421   NITRITE NEGATIVE 03/30/2019 1421   LEUKOCYTESUR TRACE (A) 03/30/2019 1421   LEUKOCYTESUR NEGATIVE 08/15/2012 1031   Sepsis Labs: @LABRCNTIP (procalcitonin:4,lacticidven:4) )No results found for this or any previous visit (from the past 240 hour(s)).   Radiological Exams on Admission: CT Angio Chest PE W and/or Wo Contrast  Result Date: 11/17/2019 CLINICAL DATA:  Positive COVID, cough EXAM: CT ANGIOGRAPHY CHEST WITH CONTRAST TECHNIQUE: Multidetector CT imaging of the chest was performed using the standard protocol during bolus administration of intravenous contrast. Multiplanar CT image reconstructions and MIPs were obtained to evaluate the vascular anatomy. CONTRAST:  63mL OMNIPAQUE IOHEXOL  350 MG/ML SOLN COMPARISON:  March 30, 2019 FINDINGS: Cardiovascular: There is a optimal opacification of the pulmonary arteries. There is no central,segmental, or subsegmental filling defects within the pulmonary arteries. There is mild cardiomegaly. Coronary artery calcifications are seen. No pericardial effusion or thickening. No evidence right heart strain. There is a patent four-vessel arch with a common origin of  the left vertebral artery off the arch. Scattered aortic atherosclerosis is noted. Mediastinum/Nodes: No hilar, mediastinal, or axillary adenopathy. Thyroid gland, trachea, and esophagus demonstrate no significant findings. Lungs/Pleura: There is a small focal patchy opacity seen at the left lung base. The right lung is clear. No pleural effusion is seen. Upper Abdomen: No acute abnormalities present in the visualized portions of the upper abdomen. Musculoskeletal: No chest wall abnormality. No acute or significant osseous findings. Review of the MIP images confirms the above findings. IMPRESSION: No central, segmental, or subsegmental pulmonary embolism. Small focal patchy area at the left lung base which could be due to atelectasis and/or early infectious etiology. Aortic Atherosclerosis (ICD10-I70.0). Electronically Signed   By: Jonna Clark M.D.   On: 11/17/2019 21:40   DG Chest Portable 1 View  Result Date: 11/17/2019 CLINICAL DATA:  Cough, chest pain, COVID positive. EXAM: PORTABLE CHEST 1 VIEW COMPARISON:  03/15/2019 FINDINGS: The cardiomediastinal contours are normal. Aortic atherosclerosis. Minimal subsegmental opacities in the lung bases. Pulmonary vasculature is normal. No consolidation, pleural effusion, or pneumothorax. No acute osseous abnormalities are seen. IMPRESSION: Minimal subsegmental bibasilar opacities, may be atelectasis or atypical viral pneumonia in the setting of COVID. Aortic Atherosclerosis (ICD10-I70.0). Electronically Signed   By: Narda Rutherford M.D.   On:  11/17/2019 20:28    EKG: Independently reviewed.  New left bundle branch block with a rate of 98.  Assessment/Plan Principal Problem:   NSTEMI (non-ST elevated myocardial infarction) (HCC) Active Problems:   CAD (coronary artery disease)   Hyperlipidemia   Type 2 diabetes mellitus (HCC)   CKD (chronic kidney disease) stage 3, GFR 30-59 ml/min   GERD (gastroesophageal reflux disease)   Essential (primary) hypertension   Hyperlipidemia, mixed     #1 non-ST elevation MI: In the setting of known coronary artery disease.  Patient chest pain is better now.  Initiate IV heparin.  Continue nitroglycerin, aspirin, beta-blockers, continue with cardiology consult for any further evaluation.  #2 COVID-19 infection: Largely asymptomatic.  Patient will be placed on steroids.  She is having cough but no significant hypoxia.  #3 coronary artery disease: Now has NSTEMI.  Continue all other medications including statins.  Maintain on telemetry  #4 GERD: Continue with PPIs  #5 essential hypertension: Blood pressure will be controlled with home regimen.  #6 diabetes: Sliding scale insulin.  Monitor blood sugar before every meal and nightly.    DVT prophylaxis: Heparin drip Code Status: Full code Family Communication: No family at bedside Disposition Plan: Home Consults called: Dr. Gwen Pounds, cardiology Admission status: Inpatient  Severity of Illness: The appropriate patient status for this patient is INPATIENT. Inpatient status is judged to be reasonable and necessary in order to provide the required intensity of service to ensure the patient's safety. The patient's presenting symptoms, physical exam findings, and initial radiographic and laboratory data in the context of their chronic comorbidities is felt to place them at high risk for further clinical deterioration. Furthermore, it is not anticipated that the patient will be medically stable for discharge from the hospital within 2 midnights  of admission. The following factors support the patient status of inpatient.   " The patient's presenting symptoms include chest pain. " The worrisome physical exam findings include nonreproducible chest pain. " The initial radiographic and laboratory data are worrisome because of elevated troponin. " The chronic co-morbidities include coronary artery disease and new COVID-19 infection.   * I certify that at the point of admission it is my clinical judgment that  the patient will require inpatient hospital care spanning beyond 2 midnights from the point of admission due to high intensity of service, high risk for further deterioration and high frequency of surveillance required.Lonia Blood MD [facility] Pager [phone]  If 7PM-7AM, please contact night-coverage [email or web address] Password [facility]  11/17/2019, 11:27 PM

## 2019-11-17 NOTE — ED Notes (Signed)
Pt states past history of heart attach. Pt states testing positive for covid-19. Pt noted to have cough. EKG completed. ER provider aware. Pt placed on cardiac, bp and pulse ox monitor.
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
## 2019-11-17 NOTE — ED Triage Notes (Signed)
Pt BIB EMS after testing positive for covid-19 yesterday. Pt came in complaining of chest pain that resolved. Pt took 4 baby aspirin 81mg  PTA.

## 2019-11-17 NOTE — Progress Notes (Signed)
ANTICOAGULATION CONSULT NOTE - Initial Consult  Pharmacy Consult for heparin Indication: chest pain/ACS  Allergies  Allergen Reactions  . Butenafine Swelling    Tongue swelled and turned red  . Levofloxacin     Other reaction(s): Other (See Comments) Tongue swelled and turned red  . Limonene Other (See Comments)    Other reaction(s): Other (See Comments) Tongue swelled and turned red Tongue swelled and turned red   . Terbinafine And Related Other (See Comments)    Tongue swelled and turned red    Patient Measurements: Height: 5\' 2"  (157.5 cm) Weight: 64.9 kg (143 lb) IBW/kg (Calculated) : 50.1 Heparin Dosing Weight: 56 kg  Vital Signs: Temp: 99.8 F (37.7 C) (07/30 1931) Temp Source: Oral (07/30 1931) BP: 121/50 (07/30 2200) Pulse Rate: 77 (07/30 2200)  Labs: Recent Labs    11/17/19 1936 11/17/19 2146  HGB 11.0*  --   HCT 32.7*  --   PLT 289  --   CREATININE 1.48*  --   TROPONINIHS 138* 997*    Estimated Creatinine Clearance: 24.1 mL/min (A) (by C-G formula based on SCr of 1.48 mg/dL (H)).   Medical History: Past Medical History:  Diagnosis Date  . Hypercholesteremia   . Hypertension   . Myocardial infarction (HCC) 2008  . S/P angioplasty with stent     Medications:  Scheduled:  . heparin  3,400 Units Intravenous Once    Assessment: Patient admitted for CP w/ trop of 138 >> 997, EKG showing LBBB, no anticoagulation PTA. Baseline CBC WNL for patient aPTT/INR pending. Patient being started on heparin drip for NSTEMI.  Goal of Therapy:  Heparin level 0.3-0.7 units/ml Monitor platelets by anticoagulation protocol: Yes   Plan:  Will bolus heparin 3400 units IV x 1 Will start rate at 800 units/hr and will check anti-Xa at 0700. Will monitor daily CBC's and adjust per anti-Xa levels.  2009, PharmD, BCPS Clinical Pharmacist 11/17/2019,10:48 PM

## 2019-11-18 LAB — TROPONIN I (HIGH SENSITIVITY)
Troponin I (High Sensitivity): 6149 ng/L (ref <18)
Troponin I (High Sensitivity): 4922 ng/L (ref <18)
Troponin I (High Sensitivity): 5031 ng/L (ref <18)
Troponin I (High Sensitivity): 3927 ng/L (ref <18)

## 2019-11-18 LAB — LIPID PANEL
Cholesterol: 170 mg/dL (ref 0–200)
HDL: 52 mg/dL (ref >40)
LDL Cholesterol: 100 mg/dL — ABNORMAL HIGH (ref 0–99)
Total CHOL/HDL Ratio: 3.3 RATIO
Triglycerides: 92 mg/dL (ref <150)
VLDL: 18 mg/dL (ref 0–40)

## 2019-11-18 LAB — GLUCOSE, CAPILLARY
Glucose-Capillary: 153 mg/dL — ABNORMAL HIGH (ref 70–99)
Glucose-Capillary: 167 mg/dL — ABNORMAL HIGH (ref 70–99)

## 2019-11-18 LAB — HEMOGLOBIN A1C
Hgb A1c MFr Bld: 6.5 % — ABNORMAL HIGH (ref 4.8–5.6)
Mean Plasma Glucose: 139.85 mg/dL

## 2019-11-18 LAB — HEPARIN LEVEL (UNFRACTIONATED)
Heparin Unfractionated: 0.94 IU/mL — ABNORMAL HIGH (ref 0.30–0.70)
Heparin Unfractionated: 0.43 IU/mL (ref 0.30–0.70)

## 2019-11-18 MED ORDER — GABAPENTIN 100 MG PO CAPS
100.0000 mg | ORAL_CAPSULE | Freq: Three times a day (TID) | ORAL | Status: DC
  Administered 2019-11-18 – 2019-11-21 (×9): 100 mg via ORAL
  Filled 2019-11-18 (×10): qty 1

## 2019-11-18 MED ORDER — DONEPEZIL HCL 5 MG PO TABS
5.0000 mg | ORAL_TABLET | Freq: Every day | ORAL | Status: DC
  Administered 2019-11-18 – 2019-11-21 (×4): 5 mg via ORAL
  Filled 2019-11-18 (×4): qty 1

## 2019-11-18 MED ORDER — METOPROLOL TARTRATE 25 MG PO TABS
25.0000 mg | ORAL_TABLET | Freq: Two times a day (BID) | ORAL | Status: DC
  Administered 2019-11-18 – 2019-11-20 (×5): 25 mg via ORAL
  Filled 2019-11-18 (×5): qty 1

## 2019-11-18 MED ORDER — ACETAMINOPHEN 325 MG PO TABS: 650.0000 mg | ORAL_TABLET | ORAL | Status: DC | PRN

## 2019-11-18 MED ORDER — FLUTICASONE PROPIONATE 50 MCG/ACT NA SUSP
2.0000 | Freq: Every day | NASAL | Status: DC
  Filled 2019-11-18: qty 16

## 2019-11-18 MED ORDER — INSULIN ASPART 100 UNIT/ML ~~LOC~~ SOLN
0.0000 [IU] | Freq: Three times a day (TID) | SUBCUTANEOUS | Status: DC
  Administered 2019-11-19: 5 [IU] via SUBCUTANEOUS
  Administered 2019-11-19: 2 [IU] via SUBCUTANEOUS
  Administered 2019-11-21: 5 [IU] via SUBCUTANEOUS
  Filled 2019-11-18 (×3): qty 1

## 2019-11-18 MED ORDER — CLONIDINE HCL 0.1 MG/24HR TD PTWK
0.1000 mg | MEDICATED_PATCH | TRANSDERMAL | Status: DC
  Administered 2019-11-18: 0.1 mg via TRANSDERMAL
  Filled 2019-11-18: qty 1

## 2019-11-18 MED ORDER — ADULT MULTIVITAMIN W/MINERALS CH
1.0000 | ORAL_TABLET | Freq: Every day | ORAL | Status: DC
  Administered 2019-11-18 – 2019-11-21 (×4): 1 via ORAL
  Filled 2019-11-18 (×4): qty 1

## 2019-11-18 MED ORDER — CLOPIDOGREL BISULFATE 75 MG PO TABS
75.0000 mg | ORAL_TABLET | Freq: Every day | ORAL | Status: DC
  Administered 2019-11-18 – 2019-11-21 (×4): 75 mg via ORAL
  Filled 2019-11-18 (×4): qty 1

## 2019-11-18 MED ORDER — ASPIRIN EC 81 MG PO TBEC
81.0000 mg | DELAYED_RELEASE_TABLET | Freq: Every day | ORAL | Status: DC
  Administered 2019-11-18 – 2019-11-21 (×4): 81 mg via ORAL
  Filled 2019-11-18 (×4): qty 1

## 2019-11-18 MED ORDER — AMLODIPINE BESYLATE 5 MG PO TABS
5.0000 mg | ORAL_TABLET | Freq: Every day | ORAL | Status: DC
  Administered 2019-11-18 – 2019-11-21 (×4): 5 mg via ORAL
  Filled 2019-11-18 (×4): qty 1

## 2019-11-18 MED ORDER — TORSEMIDE 20 MG PO TABS
30.0000 mg | ORAL_TABLET | Freq: Every evening | ORAL | Status: DC
  Filled 2019-11-18: qty 1

## 2019-11-18 MED ORDER — COLCHICINE 0.6 MG PO TABS
0.3000 mg | ORAL_TABLET | Freq: Every day | ORAL | Status: DC
  Administered 2019-11-19 – 2019-11-21 (×3): 0.3 mg via ORAL
  Filled 2019-11-18 (×2): qty 0.5
  Filled 2019-11-18: qty 1
  Filled 2019-11-18: qty 0.5
  Filled 2019-11-18: qty 1

## 2019-11-18 MED ORDER — POLYETHYLENE GLYCOL 3350 17 G PO PACK: 17.0000 g | PACK | Freq: Every day | ORAL | Status: DC | PRN

## 2019-11-18 MED ORDER — ROSUVASTATIN CALCIUM 20 MG PO TABS
20.0000 mg | ORAL_TABLET | Freq: Every day | ORAL | Status: DC
  Filled 2019-11-18 (×2): qty 1

## 2019-11-18 MED ORDER — TIMOLOL MALEATE 0.5 % OP SOLN
1.0000 [drp] | Freq: Every evening | OPHTHALMIC | Status: DC
  Filled 2019-11-18: qty 5

## 2019-11-18 MED ORDER — TIZANIDINE HCL 4 MG PO TABS
2.0000 mg | ORAL_TABLET | Freq: Three times a day (TID) | ORAL | Status: DC | PRN
  Filled 2019-11-18: qty 1

## 2019-11-18 MED ORDER — DOXYCYCLINE HYCLATE 100 MG PO TABS
100.0000 mg | ORAL_TABLET | Freq: Two times a day (BID) | ORAL | Status: DC
  Administered 2019-11-18 – 2019-11-21 (×7): 100 mg via ORAL
  Filled 2019-11-18 (×8): qty 1

## 2019-11-18 MED ORDER — COLCHICINE 0.6 MG PO TABS
0.6000 mg | ORAL_TABLET | Freq: Every day | ORAL | Status: DC
  Administered 2019-11-18: 0.6 mg via ORAL
  Filled 2019-11-18: qty 1

## 2019-11-18 MED ORDER — SODIUM CHLORIDE 0.45 % IV SOLN: INTRAVENOUS | Status: DC

## 2019-11-18 MED ORDER — MAGNESIUM OXIDE 400 (241.3 MG) MG PO TABS
400.0000 mg | ORAL_TABLET | Freq: Every day | ORAL | Status: DC
  Administered 2019-11-18 – 2019-11-21 (×4): 400 mg via ORAL
  Filled 2019-11-18 (×4): qty 1

## 2019-11-18 MED ORDER — ATORVASTATIN CALCIUM 20 MG PO TABS
40.0000 mg | ORAL_TABLET | Freq: Every day | ORAL | Status: DC
  Administered 2019-11-19 – 2019-11-21 (×3): 40 mg via ORAL
  Filled 2019-11-18 (×3): qty 2

## 2019-11-18 MED ORDER — ROSUVASTATIN CALCIUM 20 MG PO TABS: 20.0000 mg | ORAL_TABLET | Freq: Every day | ORAL | Status: DC

## 2019-11-18 MED ORDER — PANTOPRAZOLE SODIUM 40 MG PO TBEC
40.0000 mg | DELAYED_RELEASE_TABLET | Freq: Every day | ORAL | Status: DC
  Administered 2019-11-18 – 2019-11-21 (×4): 40 mg via ORAL
  Filled 2019-11-18 (×4): qty 1

## 2019-11-18 MED ORDER — DEXAMETHASONE SODIUM PHOSPHATE 10 MG/ML IJ SOLN
6.0000 mg | INTRAMUSCULAR | Status: DC
  Administered 2019-11-18 – 2019-11-21 (×4): 6 mg via INTRAVENOUS
  Filled 2019-11-18 (×4): qty 1

## 2019-11-18 MED ORDER — ACETAMINOPHEN 500 MG PO TABS: 1000.0000 mg | ORAL_TABLET | Freq: Four times a day (QID) | ORAL | Status: DC | PRN

## 2019-11-18 MED ORDER — ALPRAZOLAM 0.25 MG PO TABS: 0.2500 mg | ORAL_TABLET | Freq: Two times a day (BID) | ORAL | Status: DC | PRN

## 2019-11-18 MED ORDER — ONDANSETRON HCL 4 MG/2ML IJ SOLN: 4.0000 mg | Freq: Four times a day (QID) | INTRAMUSCULAR | Status: DC | PRN

## 2019-11-18 MED ORDER — GLIMEPIRIDE 1 MG PO TABS
1.0000 mg | ORAL_TABLET | Freq: Every day | ORAL | Status: DC
  Administered 2019-11-19 – 2019-11-21 (×3): 1 mg via ORAL
  Filled 2019-11-18 (×4): qty 1

## 2019-11-18 MED ORDER — NITROGLYCERIN 0.4 MG SL SUBL: 0.4000 mg | SUBLINGUAL_TABLET | SUBLINGUAL | Status: DC | PRN

## 2019-11-18 MED ORDER — ISOSORBIDE MONONITRATE ER 60 MG PO TB24
60.0000 mg | ORAL_TABLET | Freq: Every day | ORAL | Status: DC
  Administered 2019-11-18 – 2019-11-21 (×4): 60 mg via ORAL
  Filled 2019-11-18 (×4): qty 1

## 2019-11-18 MED ORDER — SPIRONOLACTONE 25 MG PO TABS
25.0000 mg | ORAL_TABLET | Freq: Every day | ORAL | Status: DC
  Filled 2019-11-18: qty 1

## 2019-11-18 NOTE — Consult Note (Signed)
[facility] Card[facility] Consultation Note  Patient ID: [name], MRN: [id number], DOB/AGE: 1931/03/17 86 y.o. Admit date: 11/17/2019   Date of Consult: 11/18/2019 Primary Physician: Danella Penton, MD Primary Cardiologist: Julio Sicks Paraschosf Complaint:  Chief Complaint  Patient presents with  . Chest Pain   Reason for Consult: Myocardial infarction  HPI: 86 y.o. female with known coronary artery disease diabetes hypertension hyperlipidemia status post previous stent placement in the past who has had an acute respiratory failure due to viral pneumonia and Covid positive.  The patient was proceeding to get treatment for this viral pneumonia when she had acute onset of chest discomfort substernal in nature continuing throughout her period of time requiring assessment in the emergency room.  At that time the patient has had significant improvements of symptoms of although EKG has shown normal sinus rhythm with left bundle branch block and troponin level of 6149 is consistent with non-ST elevation myocardial infarction.  Currently the patient is hemodynamically stable and currently is received Plavix heparin and appropriate other medication management.  There has been a discussion of further intervention although being Covid positive would likely proceed to more noninvasive procedures unless absolutely necessary until patient further improves from viral pneumonia  Past Medical History:  Diagnosis Date  . Hypercholesteremia   . Hypertension   . Myocardial infarction (HCC) 2008  . S/P angioplasty with stent       Surgical History:  Past Surgical History:  Procedure Laterality Date  . ABDOMINAL HYSTERECTOMY    . APPENDECTOMY    . CORONARY ANGIOPLASTY WITH STENT PLACEMENT       Home Meds: Prior to Admission medications   Medication Sig Start Date End Date Taking? Authorizing Provider  acetaminophen (TYLENOL) 500 MG tablet Take 1,000 mg by mouth every 6 (six) hours as needed.  1-2 tabs daily as needed For pain   Yes [provider]  amLODipine (NORVASC) 5 MG tablet Take 5 mg by mouth daily. 11/14/19  Yes [provider]  aspirin EC 81 MG tablet Take 81 mg by mouth daily.   Yes [provider]  cloNIDine (CATAPRES - DOSED IN MG/24 HR) 0.1 mg/24hr patch 0.1 mg once a week. 11/14/19  Yes [provider]  clopidogrel (PLAVIX) 75 MG tablet Take 75 mg by mouth daily.   Yes [provider]  colchicine 0.6 MG tablet Take 0.6 mg by mouth daily. 11/16/19  Yes [provider]  donepezil (ARICEPT) 5 MG tablet Take 5 mg by mouth daily. 11/14/19  Yes [provider]  doxycycline (VIBRAMYCIN) 100 MG capsule Take 100 mg by mouth 2 (two) times daily. 11/16/19  Yes [provider]  fluticasone (FLONASE) 50 MCG/ACT nasal spray Place 2 sprays into both nostrils daily. 11/16/19  Yes [provider]  gabapentin (NEURONTIN) 100 MG capsule Take 100 mg by mouth 3 (three) times daily.  02/14/15  Yes [provider]  glimepiride (AMARYL) 1 MG tablet Take 1 mg by mouth daily with breakfast.  08/29/18 11/17/19 Yes [provider]  isosorbide mononitrate (IMDUR) 60 MG 24 hr tablet Take 60 mg by mouth daily.   Yes [provider]  magnesium oxide (MAG-OX) 400 MG tablet Take 400 mg by mouth daily.    Yes [provider]  Multiple Vitamins-Minerals (MULTIVITAMIN GUMMIES ADULT) CHEW Chew 1 tablet by mouth daily. 08/30/19  Yes [provider]  omeprazole (PRILOSEC) 20 MG capsule Take 20 mg by mouth daily.  11/22/18  Yes [provider]  polyethylene glycol (MIRALAX) 17 g packet Take 17 g by mouth daily as needed for mild constipation. 03/31/19  Yes Dhungel, Nishant, MD  predniSONE (DELTASONE) 20 MG tablet Take 20 mg by mouth daily. 11/16/19  Yes [provider]  rosuvastatin (CRESTOR) 20 MG tablet Take 20 mg by mouth daily.   Yes [provider]  spironolactone  (ALDACTONE) 25 MG tablet Take 25 mg by mouth daily.   Yes [provider]  timolol (BETIMOL) 0.5 % ophthalmic solution Place 1 drop into both eyes every evening.   Yes [provider]  tizanidine (ZANAFLEX) 2 MG capsule Take 2 mg by mouth 3 (three) times daily as needed for muscle spasms.   Yes [provider]  torsemide (DEMADEX) 20 MG tablet Take 30 mg by mouth every evening.    Yes [provider]  nitroGLYCERIN (NITROSTAT) 0.4 MG SL tablet Place 0.4 mg under the tongue every 5 (five) minutes as needed. For chest pain    [provider]    Inpatient Medications:  . amLODipine  5 mg Oral Daily  . aspirin EC  81 mg Oral Daily  . atorvastatin  40 mg Oral Daily  . cloNIDine  0.1 mg Transdermal Weekly  . clopidogrel  75 mg Oral Daily  . [START ON 11/19/2019] colchicine  0.3 mg Oral Daily  . dexamethasone (DECADRON) injection  6 mg Intravenous Q24H  . donepezil  5 mg Oral Daily  . doxycycline  100 mg Oral BID  . fluticasone  2 spray Each Nare Daily  . gabapentin  100 mg Oral TID  . [START ON 11/19/2019] glimepiride  1 mg Oral Q breakfast  . insulin aspart  0-15 Units Subcutaneous TID WC  . isosorbide mononitrate  60 mg Oral Daily  . magnesium oxide  400 mg Oral Daily  . metoprolol tartrate  25 mg Oral BID  . multivitamin with minerals  1 tablet Oral Daily  . pantoprazole  40 mg Oral Daily  . timolol  1 drop Both Eyes QPM   . sodium chloride 75 mL/hr at 11/18/19 1126  . heparin 600 Units/hr (11/18/19 0817)    Allergies:  Allergies  Allergen Reactions  . Butenafine Swelling    Tongue swelled and turned red  . Levofloxacin     Other reaction(s): Other (See Comments) Tongue swelled and turned red  . Limonene Other (See Comments)    Other reaction(s): Other (See Comments) Tongue swelled and turned red Tongue swelled and turned red   . Terbinafine And Related Other (See Comments)    Tongue swelled and turned red    Social History    Socioeconomic History  . Marital status: Married    Spouse name: Not on file  . Number of children: Not on file  . Years of education: Not on file  . Highest education level: Not on file  Occupational History  . Not on file  Tobacco Use  . Smoking status: Never Smoker  . Smokeless tobacco: Never Used  Vaping Use  . Vaping Use: Never used  Substance and Sexual Activity  . Alcohol use: No  . Drug use: No  . Sexual activity: Not on file  Other Topics Concern  . Not on file  Social History Narrative  . Not on file   Social Determinants of Health   Financial Resource Strain:   . Difficulty of Paying Living Expenses:   Food Insecurity:   . Worried About Programme researcher, broadcasting/film/video in  the Last Year:   . Ran Out of Food in the Last Year:   Transportation Needs:   . Freight forwarder (Medical):   Marland Kitchen Lack of Transportation (Non-Medical):   Physical Activity:   . Days of Exercise per Week:   . Minutes of Exercise per Session:   Stress:   . Feeling of Stress :   Social Connections:   . Frequency of Communication with Friends and Family:   . Frequency of Social Gatherings with Friends and Family:   . Attends Religious Services:   . Active Member of Clubs or Organizations:   . Attends Banker Meetings:   Marland Kitchen Marital Status:   Intimate Partner Violence:   . Fear of Current or Ex-Partner:   . Emotionally Abused:   Marland Kitchen Physically Abused:   . Sexually Abused:      Family History  Problem Relation Age of Onset  . Breast cancer Maternal Aunt      Review of Systems Not assessed    Labs: No results for input(s): CKTOTAL, CKMB, TROPONINI in the last 72 hours. Lab Results  Component Value Date   WBC 14.3 (H) 11/17/2019   HGB 11.0 (L) 11/17/2019   HCT 32.7 (L) 11/17/2019   MCV 97.6 11/17/2019   PLT 289 11/17/2019    Recent Labs  Lab 11/17/19 1936  NA 134*  K 3.8  CL 99  CO2 23  BUN 26*  CREATININE 1.48*  CALCIUM 8.8*  GLUCOSE 240*   Lab Results   Component Value Date   CHOL 170 11/18/2019   HDL 52 11/18/2019   LDLCALC 100 (H) 11/18/2019   TRIG 92 11/18/2019   No results found for: DDIMER  Radiology/Studies:  CT Angio Chest PE W and/or Wo Contrast  Result Date: 11/17/2019 CLINICAL DATA:  Positive COVID, cough EXAM: CT ANGIOGRAPHY CHEST WITH CONTRAST TECHNIQUE: Multidetector CT imaging of the chest was performed using the standard protocol during bolus administration of intravenous contrast. Multiplanar CT image reconstructions and MIPs were obtained to evaluate the vascular anatomy. CONTRAST:  53mL OMNIPAQUE IOHEXOL 350 MG/ML SOLN COMPARISON:  March 30, 2019 FINDINGS: Cardiovascular: There is a optimal opacification of the pulmonary arteries. There is no central,segmental, or subsegmental filling defects within the pulmonary arteries. There is mild cardiomegaly. Coronary artery calcifications are seen. No pericardial effusion or thickening. No evidence right heart strain. There is a patent four-vessel arch with a common origin of the left vertebral artery off the arch. Scattered aortic atherosclerosis is noted. Mediastinum/Nodes: No hilar, mediastinal, or axillary adenopathy. Thyroid gland, trachea, and esophagus demonstrate no significant findings. Lungs/Pleura: There is a small focal patchy opacity seen at the left lung base. The right lung is clear. No pleural effusion is seen. Upper Abdomen: No acute abnormalities present in the visualized portions of the upper abdomen. Musculoskeletal: No chest wall abnormality. No acute or significant osseous findings. Review of the MIP images confirms the above findings. IMPRESSION: No central, segmental, or subsegmental pulmonary embolism. Small focal patchy area at the left lung base which could be due to atelectasis and/or early infectious etiology. Aortic Atherosclerosis (ICD10-I70.0). Electronically Signed   By: Jonna Clark M.D.   On: 11/17/2019 21:40   DG Chest Portable 1 View  Result Date:  11/17/2019 CLINICAL DATA:  Cough, chest pain, COVID positive. EXAM: PORTABLE CHEST 1 VIEW COMPARISON:  03/15/2019 FINDINGS: The cardiomediastinal contours are normal. Aortic atherosclerosis. Minimal subsegmental opacities in the lung bases. Pulmonary vasculature is normal. No consolidation, pleural effusion, or pneumothorax.  No acute osseous abnormalities are seen. IMPRESSION: Minimal subsegmental bibasilar opacities, may be atelectasis or atypical viral pneumonia in the setting of COVID. Aortic Atherosclerosis (ICD10-I70.0). Electronically Signed   By: Narda Rutherford M.D.   On: 11/17/2019 20:28    EKG: Normal sinus rhythm with left bundle branch block  Weights: Filed Weights   11/17/19 1932  Weight: 64.9 kg     Physical Exam: Blood pressure (!) 144/53, pulse 72, temperature 99.8 F (37.7 C), temperature source Oral, resp. rate 19, height 5\' 2"  (1.575 m), weight 64.9 kg, SpO2 96 %. Body mass index is 26.16 kg/m. As per primary doc    Assessment: 86 year old female with known coronary artery disease hypertension hyperlipidemia chronic kidney disease stage III with viral pneumonia likely exacerbating cardiovascular disease with acute non-ST elevation myocardial infarction now stable with appropriate medication management  Plan: 1.  Continue heparin for a minimum of 48 hours for non-ST elevation myocardial infarction and addition of Plavix as well 2.  Addition of low-dose beta-blocker for myocardial infarction for hypertension and heart rate control for risk reduction of cardiovascular event 3.  Echocardiogram for assessment of LV systolic dysfunction valvular heart disease contributing to above 4.  Currently goal to avoid invasive procedure at this time due to significant viral pneumonia and chronic kidney disease until more stable and able to safely proceed for further evaluation  Signed, Lamar Blinks M.D. [facility] Cardiology 11/18/2019, 4:26 PM

## 2019-11-18 NOTE — Progress Notes (Signed)
ANTICOAGULATION CONSULT NOTE  Pharmacy Consult for heparin Indication: chest pain/ACS  Allergies  Allergen Reactions   Butenafine Swelling    Tongue swelled and turned red   Levofloxacin     Other reaction(s): Other (See Comments) Tongue swelled and turned red   Limonene Other (See Comments)    Other reaction(s): Other (See Comments) Tongue swelled and turned red Tongue swelled and turned red    Terbinafine And Related Other (See Comments)    Tongue swelled and turned red    Patient Measurements: Height: 5\' 2"  (157.5 cm) Weight: 64.9 kg (143 lb) IBW/kg (Calculated) : 50.1 Heparin Dosing Weight: 56 kg  Vital Signs: BP: 149/58 (07/31 0651) Pulse Rate: 75 (07/31 0651)  Labs: Recent Labs    11/17/19 1936 11/17/19 2146 11/18/19 0647  HGB 11.0*  --   --   HCT 32.7*  --   --   PLT 289  --   --   APTT 38*  --   --   LABPROT 13.0  --   --   INR 1.0  --   --   HEPARINUNFRC  --   --  0.94*  CREATININE 1.48*  --   --   TROPONINIHS 138* 997*  --     Estimated Creatinine Clearance: 24.1 mL/min (A) (by C-G formula based on SCr of 1.48 mg/dL (H)).   Medical History: Past Medical History:  Diagnosis Date   Hypercholesteremia    Hypertension    Myocardial infarction (HCC) 2008   S/P angioplasty with stent     Medications:  Scheduled:   amLODipine  5 mg Oral Daily   aspirin EC  81 mg Oral Daily   cloNIDine  0.1 mg Transdermal Weekly   clopidogrel  75 mg Oral Daily   colchicine  0.6 mg Oral Daily   dexamethasone (DECADRON) injection  6 mg Intravenous Q24H   donepezil  5 mg Oral Daily   doxycycline  100 mg Oral BID   fluticasone  2 spray Each Nare Daily   gabapentin  100 mg Oral TID   glimepiride  1 mg Oral Q breakfast   isosorbide mononitrate  60 mg Oral Daily   magnesium oxide  400 mg Oral Daily   metoprolol tartrate  25 mg Oral BID   multivitamin with minerals  1 tablet Oral Daily   pantoprazole  40 mg Oral Daily   rosuvastatin  20  mg Oral Daily   spironolactone  25 mg Oral Daily   timolol  1 drop Both Eyes QPM   torsemide  30 mg Oral QPM    Assessment: Patient admitted for CP w/ trop of 138 >> 997, EKG showing LBBB, no anticoagulation PTA. Baseline CBC WNL for patient aPTT/INR pending. Patient being started on heparin drip for NSTEMI.  7/31 0647 HL 0.94   Goal of Therapy:  Heparin level 0.3-0.7 units/ml Monitor platelets by anticoagulation protocol: Yes   Plan:  Heparin level is supratherapeutic. Will decrease heparin infusion to 600 units/hr and will check anti-Xa in 8 hours. CBC stable. CBC daily while on heparin.   8/31,  PharmD, BCPS Clinical Pharmacist 11/18/2019,7:56 AM

## 2019-11-18 NOTE — Progress Notes (Signed)
PROGRESS NOTE    [name]  CWC:[id number] DOB: Dec 30, 1930 DOA: 11/17/2019 PCP: Danella Penton, MD    Brief Narrative:[name] is a 86 y.o. female with medical history significant of coronary artery disease, diabetes, hypertension, status post previous stent, hyperlipidemia who was recently diagnosed with COVID-19 infection about 4 days ago.  Patient has received the Pfizer vaccine.  She came in with substernal chest pain.  Associated with some cough and shortness of breath.  Patient went to get IVIG today and was having significant chest pain.  Chest pain was rated a 7 out of 10 substernal more pressure-like.  Similar to her previous chest pain when she had MI.  It was relieved mildly by nitroglycerin and morphine in the ER.  She denies any fever or chills.  Not hypoxic.  She has remained asymptomatic with her COVID-19 except for generalized aches and malaise.  Patient's troponin was noted in the ER to initially be above 100 and now about 1000 and the second 1.  EKG also showed new bundle branch block so patient is being admitted with non-ST elevation MI..  ED Course: Temperature 99.8 blood pressure 140/51 pulse 97 respirate 25 oxygen sats 93% on room air.  White count is 14.3 hemoglobin 11.0 with platelets 289.  Sodium 134 potassium 3.8 chloride 99 CO2 is 23 BUN 26 creatinine 1.49 calcium 8.8 glucose 241 INR 1.0.  EKG showed new left bundle branch block.  Initial troponin was 138 and second troponin is 997.  Chest x-ray showed mild changes of bibasilar opacities consistent with COVID-19 infection.  CT angios of the chest showed no PE there was small focal patchy right the lung left base due to Covid infection.  Patient being admitted with non-ST elevation MI.  Assessment & Plan:   Principal Problem:   NSTEMI (non-ST elevated myocardial infarction) (HCC) Active Problems:   CAD (coronary artery disease)   Hyperlipidemia   Type 2 diabetes mellitus (HCC)   CKD (chronic kidney disease)  stage 3, GFR 30-59 ml/min   GERD (gastroesophageal reflux disease)   Essential (primary) hypertension   Hyperlipidemia, mixed   #1 COVID-19 patient admitted with productive cough but on room air saturating normal. She is not hypoxic at this time. Will treated with remdesivir protocol for 5 days. CT chest shows no evidence of pulmonary embolism however does have some infiltrates left lung base. Isolate for 10 days Continue Decadron Patient has been started on doxycycline at the time of admission will continue.  #2 NSTEMI with new acute EKG changes-patient with history of prior MI. Troponin trending up. Cardiology consulted. Troponin 6149 up from 997 up from 138 on admission. EKG new left bundle compared to her EKG from December 2020. Check echo  #3 history of CAD-cardiology consulted continue Plavix and asa. Patient does not appear to be in fluid overload will hold Demadex and Aldactone.  Slow IV hydration.  #4 history of essential hypertension-on Norvasc Catapres Imdur, Aldactone and Demadex   #5 history of type 2 diabetes-CBG (last 3)  No results for input(s): GLUCAP in the last 72 hours.  On Amaryl ssi  #6 history of GERD on Prilosec  #7 history of dementia on Aricept  #8 history of gout continue colchicine  #9 hyperlipidemia continue Crestor   Estimated body mass index is 26.16 kg/m as calculated from the following:   Height as of this encounter: 5\' 2"  (1.575 m).   Weight as of this encounter: 64.9 kg.  DVT prophylaxis: Heparin Code Status:  Full code Family Communication: None at bedside Disposition Plan:  Status is: Inpatient  Dispo: The patient is from: Home              Anticipated d/c is to: Home              Anticipated d/c date is: > 3 days              Patient currently is not medically stable to d/c.  Patient admitted with  NSTEMI and Covid    Consultants:   Cardiology  Procedures: None Antimicrobials: Doxycycline  Subjective: She complains of a  productive cough denies any further chest pain does have some shortness of breath  Objective: Vitals:   11/18/19 0430 11/18/19 0500 11/18/19 0530 11/18/19 0651  BP: 117/70 (!) 169/58 (!) 161/53 (!) 149/58  Pulse: 77 79 82 75  Resp: 15 20 17 18   Temp:      TempSrc:      SpO2: 98% 95% 94% 96%  Weight:      Height:        Intake/Output Summary (Last 24 hours) at 11/18/2019 1011 Last data filed at 11/18/2019 0532 Gross per 24 hour  Intake 83.62 ml  Output --  Net 83.62 ml   Filed Weights   11/17/19 1932  Weight: 64.9 kg    Examination:  General exam: Appears calm and comfortable  Respiratory system: Coarse to auscultation. Respiratory effort normal. Cardiovascular system: S1 & S2 heard, RRR. No JVD, murmurs, rubs, gallops or clicks. No pedal edema. Gastrointestinal system: Abdomen is nondistended, soft and nontender. No organomegaly or masses felt. Normal bowel sounds heard. Central nervous system: Alert and oriented. No focal neurological deficits. Extremities: Symmetric 5 x 5 power. Skin: No rashes, lesions or ulcers Psychiatry: Judgement and insight appear normal. Mood & affect appropriate.     Data Reviewed: I have personally reviewed following labs and imaging studies  CBC: Recent Labs  Lab 11/17/19 1936  WBC 14.3*  NEUTROABS 13.4*  HGB 11.0*  HCT 32.7*  MCV 97.6  PLT 289   Basic Metabolic Panel: Recent Labs  Lab 11/17/19 1936  NA 134*  K 3.8  CL 99  CO2 23  GLUCOSE 240*  BUN 26*  CREATININE 1.48*  CALCIUM 8.8*   GFR: Estimated Creatinine Clearance: 24.1 mL/min (A) (by C-G formula based on SCr of 1.48 mg/dL (H)). Liver Function Tests: No results for input(s): AST, ALT, ALKPHOS, BILITOT, PROT, ALBUMIN in the last 168 hours. No results for input(s): LIPASE, AMYLASE in the last 168 hours. No results for input(s): AMMONIA in the last 168 hours. Coagulation Profile: Recent Labs  Lab 11/17/19 1936  INR 1.0   Cardiac Enzymes: No results for  input(s): CKTOTAL, CKMB, CKMBINDEX, TROPONINI in the last 168 hours. BNP (last 3 results) No results for input(s): PROBNP in the last 8760 hours. HbA1C: No results for input(s): HGBA1C in the last 72 hours. CBG: No results for input(s): GLUCAP in the last 168 hours. Lipid Profile: Recent Labs    11/18/19 0647  CHOL 170  HDL 52  LDLCALC 100*  TRIG 92  CHOLHDL 3.3   Thyroid Function Tests: No results for input(s): TSH, T4TOTAL, FREET4, T3FREE, THYROIDAB in the last 72 hours. Anemia Panel: No results for input(s): VITAMINB12, FOLATE, FERRITIN, TIBC, IRON, RETICCTPCT in the last 72 hours. Sepsis Labs: No results for input(s): PROCALCITON, LATICACIDVEN in the last 168 hours.  No results found for this or any previous visit (from the past 240 hour(s)).  Radiology Studies: CT Angio Chest PE W and/or Wo Contrast  Result Date: 11/17/2019 CLINICAL DATA:  Positive COVID, cough EXAM: CT ANGIOGRAPHY CHEST WITH CONTRAST TECHNIQUE: Multidetector CT imaging of the chest was performed using the standard protocol during bolus administration of intravenous contrast. Multiplanar CT image reconstructions and MIPs were obtained to evaluate the vascular anatomy. CONTRAST:  70mL OMNIPAQUE IOHEXOL 350 MG/ML SOLN COMPARISON:  March 30, 2019 FINDINGS: Cardiovascular: There is a optimal opacification of the pulmonary arteries. There is no central,segmental, or subsegmental filling defects within the pulmonary arteries. There is mild cardiomegaly. Coronary artery calcifications are seen. No pericardial effusion or thickening. No evidence right heart strain. There is a patent four-vessel arch with a common origin of the left vertebral artery off the arch. Scattered aortic atherosclerosis is noted. Mediastinum/Nodes: No hilar, mediastinal, or axillary adenopathy. Thyroid gland, trachea, and esophagus demonstrate no significant findings. Lungs/Pleura: There is a small focal patchy opacity seen at the left  lung base. The right lung is clear. No pleural effusion is seen. Upper Abdomen: No acute abnormalities present in the visualized portions of the upper abdomen. Musculoskeletal: No chest wall abnormality. No acute or significant osseous findings. Review of the MIP images confirms the above findings. IMPRESSION: No central, segmental, or subsegmental pulmonary embolism. Small focal patchy area at the left lung base which could be due to atelectasis and/or early infectious etiology. Aortic Atherosclerosis (ICD10-I70.0). Electronically Signed   By: Jonna Clark M.D.   On: 11/17/2019 21:40   DG Chest Portable 1 View  Result Date: 11/17/2019 CLINICAL DATA:  Cough, chest pain, COVID positive. EXAM: PORTABLE CHEST 1 VIEW COMPARISON:  03/15/2019 FINDINGS: The cardiomediastinal contours are normal. Aortic atherosclerosis. Minimal subsegmental opacities in the lung bases. Pulmonary vasculature is normal. No consolidation, pleural effusion, or pneumothorax. No acute osseous abnormalities are seen. IMPRESSION: Minimal subsegmental bibasilar opacities, may be atelectasis or atypical viral pneumonia in the setting of COVID. Aortic Atherosclerosis (ICD10-I70.0). Electronically Signed   By: Narda Rutherford M.D.   On: 11/17/2019 20:28        Scheduled Meds: . amLODipine  5 mg Oral Daily  . aspirin EC  81 mg Oral Daily  . cloNIDine  0.1 mg Transdermal Weekly  . clopidogrel  75 mg Oral Daily  . colchicine  0.6 mg Oral Daily  . dexamethasone (DECADRON) injection  6 mg Intravenous Q24H  . donepezil  5 mg Oral Daily  . doxycycline  100 mg Oral BID  . fluticasone  2 spray Each Nare Daily  . gabapentin  100 mg Oral TID  . [START ON 11/19/2019] glimepiride  1 mg Oral Q breakfast  . isosorbide mononitrate  60 mg Oral Daily  . magnesium oxide  400 mg Oral Daily  . metoprolol tartrate  25 mg Oral BID  . multivitamin with minerals  1 tablet Oral Daily  . pantoprazole  40 mg Oral Daily  . rosuvastatin  20 mg Oral Daily    . spironolactone  25 mg Oral Daily  . timolol  1 drop Both Eyes QPM  . torsemide  30 mg Oral QPM   Continuous Infusions: . sodium chloride    . heparin 600 Units/hr (11/18/19 0817)     LOS: 1 day     Alwyn Ren, MD 11/18/2019, 10:11 AM

## 2019-11-19 ENCOUNTER — Encounter: Payer: Self-pay | Admitting: Internal Medicine

## 2019-11-19 LAB — CBC
HCT: 31.8 % — ABNORMAL LOW (ref 36.0–46.0)
Hemoglobin: 10.9 g/dL — ABNORMAL LOW (ref 12.0–15.0)
MCH: 33 pg (ref 26.0–34.0)
MCHC: 34.3 g/dL (ref 30.0–36.0)
MCV: 96.4 fL (ref 80.0–100.0)
Platelets: 292 10*3/uL (ref 150–400)
RBC: 3.3 MIL/uL — ABNORMAL LOW (ref 3.87–5.11)
RDW: 14.1 % (ref 11.5–15.5)
WBC: 14.4 10*3/uL — ABNORMAL HIGH (ref 4.0–10.5)
nRBC: 0 % (ref 0.0–0.2)

## 2019-11-19 LAB — GLUCOSE, CAPILLARY
Glucose-Capillary: 128 mg/dL — ABNORMAL HIGH (ref 70–99)
Glucose-Capillary: 208 mg/dL — ABNORMAL HIGH (ref 70–99)
Glucose-Capillary: 144 mg/dL — ABNORMAL HIGH (ref 70–99)
Glucose-Capillary: 135 mg/dL — ABNORMAL HIGH (ref 70–99)

## 2019-11-19 LAB — HEPARIN LEVEL (UNFRACTIONATED)
Heparin Unfractionated: 0.32 IU/mL (ref 0.30–0.70)
Heparin Unfractionated: 0.38 IU/mL (ref 0.30–0.70)

## 2019-11-19 LAB — PROCALCITONIN: Procalcitonin: <0.1 ng/mL

## 2019-11-19 MED ORDER — INSULIN ASPART 100 UNIT/ML ~~LOC~~ SOLN
4.0000 [IU] | Freq: Three times a day (TID) | SUBCUTANEOUS | Status: DC
  Administered 2019-11-19 – 2019-11-21 (×4): 4 [IU] via SUBCUTANEOUS
  Filled 2019-11-19 (×4): qty 1

## 2019-11-19 MED ORDER — DM-GUAIFENESIN ER 30-600 MG PO TB12
1.0000 | ORAL_TABLET | Freq: Two times a day (BID) | ORAL | Status: DC
  Administered 2019-11-19 – 2019-11-21 (×5): 1 via ORAL
  Filled 2019-11-19 (×5): qty 1

## 2019-11-19 NOTE — Progress Notes (Signed)
ANTICOAGULATION CONSULT NOTE  Pharmacy Consult for heparin Indication: chest pain/ACS  Allergies  Allergen Reactions  . Butenafine Swelling    Tongue swelled and turned red  . Levofloxacin     Other reaction(s): Other (See Comments) Tongue swelled and turned red  . Limonene Other (See Comments)    Other reaction(s): Other (See Comments) Tongue swelled and turned red Tongue swelled and turned red   . Terbinafine And Related Other (See Comments)    Tongue swelled and turned red    Patient Measurements: Height: 5\' 2"  (157.5 cm) Weight: 64.9 kg (143 lb) IBW/kg (Calculated) : 50.1 Heparin Dosing Weight: 56 kg  Vital Signs: BP: 151/60 (08/01 1000) Pulse Rate: 71 (08/01 1000)  Labs: Recent Labs    11/17/19 1936 11/17/19 2146 11/18/19 0647 11/18/19 0933 11/18/19 1612 11/18/19 1931 11/19/19 0059 11/19/19 0725 11/19/19 0932  HGB 11.0*  --   --   --   --   --   --  10.9*  --   HCT 32.7*  --   --   --   --   --   --  31.8*  --   PLT 289  --   --   --   --   --   --  292  --   APTT 38*  --   --   --   --   --   --   --   --   LABPROT 13.0  --   --   --   --   --   --   --   --   INR 1.0  --   --   --   --   --   --   --   --   HEPARINUNFRC  --    < >   < >  --  0.43  --  0.32  --  0.38  CREATININE 1.48*  --   --   --   --   --   --   --   --   TROPONINIHS 138*   < >  --  4,922* 5,031* 3,927*  --   --   --    < > = values in this interval not displayed.    Estimated Creatinine Clearance: 24.1 mL/min (A) (by C-G formula based on SCr of 1.48 mg/dL (H)).   Medical History: Past Medical History:  Diagnosis Date  . Hypercholesteremia   . Hypertension   . Myocardial infarction (HCC) 2008  . S/P angioplasty with stent     Medications:  Scheduled:  . amLODipine  5 mg Oral Daily  . aspirin EC  81 mg Oral Daily  . atorvastatin  40 mg Oral Daily  . cloNIDine  0.1 mg Transdermal Weekly  . clopidogrel  75 mg Oral Daily  . colchicine  0.3 mg Oral Daily  . dexamethasone  (DECADRON) injection  6 mg Intravenous Q24H  . donepezil  5 mg Oral Daily  . doxycycline  100 mg Oral BID  . fluticasone  2 spray Each Nare Daily  . gabapentin  100 mg Oral TID  . glimepiride  1 mg Oral Q breakfast  . insulin aspart  0-15 Units Subcutaneous TID WC  . isosorbide mononitrate  60 mg Oral Daily  . magnesium oxide  400 mg Oral Daily  . metoprolol tartrate  25 mg Oral BID  . multivitamin with minerals  1 tablet Oral Daily  . pantoprazole  40 mg Oral  Daily  . timolol  1 drop Both Eyes QPM    Assessment: Patient admitted for CP w/ trop of 138 >> 997, EKG showing LBBB, no anticoagulation PTA. Baseline CBC WNL for patient aPTT/INR pending. Patient being started on heparin drip for NSTEMI.  7/31 0647 HL 0.94  8/1 0059 HL 0.32 8/1 0932 HL 0.38   Goal of Therapy:  Heparin level 0.3-0.7 units/ml Monitor platelets by anticoagulation protocol: Yes   Plan:  Heparin level is therapeutic. Will continue current rate (600 units/hr) and will recheck HL and CBC with AM labs.    Paschal Dopp, PharmD, BCPS Clinical Pharmacist 11/19/2019,11:06 AM

## 2019-11-19 NOTE — Progress Notes (Signed)
ANTICOAGULATION CONSULT NOTE  Pharmacy Consult for heparin Indication: chest pain/ACS  Allergies  Allergen Reactions  . Butenafine Swelling    Tongue swelled and turned red  . Levofloxacin     Other reaction(s): Other (See Comments) Tongue swelled and turned red  . Limonene Other (See Comments)    Other reaction(s): Other (See Comments) Tongue swelled and turned red Tongue swelled and turned red   . Terbinafine And Related Other (See Comments)    Tongue swelled and turned red    Patient Measurements: Height: 5\' 2"  (157.5 cm) Weight: 64.9 kg (143 lb) IBW/kg (Calculated) : 50.1 Heparin Dosing Weight: 56 kg  Vital Signs: BP: 127/62 (08/01 0057) Pulse Rate: 75 (08/01 0057)  Labs: Recent Labs    11/17/19 1936 11/17/19 2146 11/18/19 11/20/19 11/18/19 0647 11/18/19 0933 11/18/19 1612 11/18/19 1931 11/19/19 0059  HGB 11.0*  --   --   --   --   --   --   --   HCT 32.7*  --   --   --   --   --   --   --   PLT 289  --   --   --   --   --   --   --   APTT 38*  --   --   --   --   --   --   --   LABPROT 13.0  --   --   --   --   --   --   --   INR 1.0  --   --   --   --   --   --   --   HEPARINUNFRC  --   --  0.94*  --   --  0.43  --  0.32  CREATININE 1.48*  --   --   --   --   --   --   --   TROPONINIHS 138*   < > 6,149*   < > 4,922* 5,031* 3,927*  --    < > = values in this interval not displayed.    Estimated Creatinine Clearance: 24.1 mL/min (A) (by C-G formula based on SCr of 1.48 mg/dL (H)).   Medical History: Past Medical History:  Diagnosis Date  . Hypercholesteremia   . Hypertension   . Myocardial infarction (HCC) 2008  . S/P angioplasty with stent     Medications:  Scheduled:  . amLODipine  5 mg Oral Daily  . aspirin EC  81 mg Oral Daily  . atorvastatin  40 mg Oral Daily  . cloNIDine  0.1 mg Transdermal Weekly  . clopidogrel  75 mg Oral Daily  . colchicine  0.3 mg Oral Daily  . dexamethasone (DECADRON) injection  6 mg Intravenous Q24H  . donepezil   5 mg Oral Daily  . doxycycline  100 mg Oral BID  . fluticasone  2 spray Each Nare Daily  . gabapentin  100 mg Oral TID  . glimepiride  1 mg Oral Q breakfast  . insulin aspart  0-15 Units Subcutaneous TID WC  . isosorbide mononitrate  60 mg Oral Daily  . magnesium oxide  400 mg Oral Daily  . metoprolol tartrate  25 mg Oral BID  . multivitamin with minerals  1 tablet Oral Daily  . pantoprazole  40 mg Oral Daily  . timolol  1 drop Both Eyes QPM    Assessment: Patient admitted for CP w/ trop of 138 >> 997, EKG showing  LBBB, no anticoagulation PTA. Baseline CBC WNL for patient aPTT/INR pending. Patient being started on heparin drip for NSTEMI.  7/31 0647 HL 0.94   Goal of Therapy:  Heparin level 0.3-0.7 units/ml Monitor platelets by anticoagulation protocol: Yes   Plan:  08/01 @ 0000 HL 0.32 therapeutic. Will continue current rate and will recheck HL w/ am labs, trops trending down, will continue to monitor.  Thomasene Ripple, PharmD, BCPS Clinical Pharmacist 11/19/2019,2:18 AM

## 2019-11-19 NOTE — Progress Notes (Addendum)
PROGRESS NOTE    [name]  LNL:[id number] DOB: [birth year] DOA: 11/17/2019 PCP: Danella Penton, MD   Brief Narrative:  [name] is a 86 y.o. female with medical history significant of coronary artery disease, diabetes, hypertension, status post previous stent, hyperlipidemia who was recently diagnosed with COVID-19 infection about 4 days ago.  Patient has received the Pfizer vaccine.  She came in with substernal chest pain.  Associated with some cough and shortness of breath. Patient received 1 dose of monoclonal antibody from and was started on Decadron and doxycycline. Found to have new onset left bundle branch block and elevated troponin, admitted for NSTEMI. Cardiology was consulted.  Subjective: Patient was complaining of worsening cough, initially dry and now becoming productive. Denies any chest pain or shortness of breath. Normal p.o. intake. No nausea or vomiting.  Assessment & Plan:   Principal Problem:   NSTEMI (non-ST elevated myocardial infarction) (HCC) Active Problems:   CAD (coronary artery disease)   Hyperlipidemia   Type 2 diabetes mellitus (HCC)   CKD (chronic kidney disease) stage 3, GFR 30-59 ml/min   GERD (gastroesophageal reflux disease)   Essential (primary) hypertension   Hyperlipidemia, mixed  NSTEMI.Patient with new onset left bundle branch block with elevated troponin, makes her NSTEMI. Has an history of recent stent placement.  Troponin peaked at 6149, now trending down. No chest pain today.  Cardiology was consulted.  -Cardiology is recommending medical management and continuation of heparin GTT for another 24-hour. -Continue DAPT with aspirin and Plavix. -Continue beta-blocker and nitroglycerin. -Echo pending  COVID-19 infection. Patient completed her vaccination with Pfizer. Only having a cough, no x-ray changes, CTA with no PE but a small possible infiltrate in right lung base, may be early infection. No hypoxia. She did received one-time  dose of monoclonal antibodies at his PCP office and started on Decadron and doxycycline. Her symptoms started couple of days after attending church last Sunday. -Continue steroid for 10 days total. -Continue doxycycline. -Check pro Calcitonin -Check CRP -Continue isolation. -No need for remdesivir at this time.  Essential hypertension. Blood pressure mildly elevated. -Continue home medications with clonidine, amlodipine along with metoprolol and Imdur.  GERD. -Continue PPI.  Type 2 diabetes. CBG elevated. Patient is on steroid now. -Add mealtime coverage of 4 units. -Continue SSI. -Continue Amaryl.  History of dementia. No acute concern -Continue home dose of Aricept.  Objective: Vitals:   11/19/19 0725 11/19/19 0800 11/19/19 0900 11/19/19 1000  BP: (!) 142/53 (!) 163/104 (!) 137/49 (!) 151/60  Pulse: 69 71 61 71  Resp: 18 17 20  (!) 24  Temp:      TempSrc:      SpO2: 98% 97% 94% 92%  Weight:      Height:       No intake or output data in the 24 hours ending 11/19/19 1146 Filed Weights   11/17/19 1932  Weight: 64.9 kg    Examination:  General exam: Pleasant elderly lady, appears calm and comfortable  Respiratory system: Clear to auscultation. Respiratory effort normal. Cardiovascular system: S1 & S2 heard, RRR. No JVD, murmurs, Gastrointestinal system: Soft, nontender, nondistended, bowel sounds positive. Central nervous system: Alert and oriented. No focal neurological deficits.Symmetric 5 x 5 power. Extremities: No edema, no cyanosis, pulses intact and symmetrical. Skin: No rashes, lesions or ulcers Psychiatry: Judgement and insight appear normal. Mood & affect appropriate.    DVT prophylaxis: Heparin Code Status: Full Family Communication: Son was updated on phone. Disposition Plan:  Status  is: Inpatient  Remains inpatient appropriate because:Inpatient level of care appropriate due to severity of illness   Dispo: The patient is from: Home               Anticipated d/c is to: Home              Anticipated d/c date is: 1 day              Patient currently is not medically stable to d/c.  Consultants:   Cardiology  Procedures:  Antimicrobials:   Data Reviewed: I have personally reviewed following labs and imaging studies  CBC: Recent Labs  Lab 11/17/19 1936 11/19/19 0725  WBC 14.3* 14.4*  NEUTROABS 13.4*  --   HGB 11.0* 10.9*  HCT 32.7* 31.8*  MCV 97.6 96.4  PLT 289 292   Basic Metabolic Panel: Recent Labs  Lab 11/17/19 1936  NA 134*  K 3.8  CL 99  CO2 23  GLUCOSE 240*  BUN 26*  CREATININE 1.48*  CALCIUM 8.8*   GFR: Estimated Creatinine Clearance: 24.1 mL/min (A) (by C-G formula based on SCr of 1.48 mg/dL (H)). Liver Function Tests: No results for input(s): AST, ALT, ALKPHOS, BILITOT, PROT, ALBUMIN in the last 168 hours. No results for input(s): LIPASE, AMYLASE in the last 168 hours. No results for input(s): AMMONIA in the last 168 hours. Coagulation Profile: Recent Labs  Lab 11/17/19 1936  INR 1.0   Cardiac Enzymes: No results for input(s): CKTOTAL, CKMB, CKMBINDEX, TROPONINI in the last 168 hours. BNP (last 3 results) No results for input(s): PROBNP in the last 8760 hours. HbA1C: Recent Labs    11/18/19 1612  HGBA1C 6.5*   CBG: Recent Labs  Lab 11/18/19 1853 11/18/19 2151 11/19/19 0802  GLUCAP 153* 167* 128*   Lipid Profile: Recent Labs    11/18/19 0647  CHOL 170  HDL 52  LDLCALC 100*  TRIG 92  CHOLHDL 3.3   Thyroid Function Tests: No results for input(s): TSH, T4TOTAL, FREET4, T3FREE, THYROIDAB in the last 72 hours. Anemia Panel: No results for input(s): VITAMINB12, FOLATE, FERRITIN, TIBC, IRON, RETICCTPCT in the last 72 hours. Sepsis Labs: No results for input(s): PROCALCITON, LATICACIDVEN in the last 168 hours.  No results found for this or any previous visit (from the past 240 hour(s)).   Radiology Studies: CT Angio Chest PE W and/or Wo Contrast  Result Date:  11/17/2019 CLINICAL DATA:  Positive COVID, cough EXAM: CT ANGIOGRAPHY CHEST WITH CONTRAST TECHNIQUE: Multidetector CT imaging of the chest was performed using the standard protocol during bolus administration of intravenous contrast. Multiplanar CT image reconstructions and MIPs were obtained to evaluate the vascular anatomy. CONTRAST:  29mL OMNIPAQUE IOHEXOL 350 MG/ML SOLN COMPARISON:  March 30, 2019 FINDINGS: Cardiovascular: There is a optimal opacification of the pulmonary arteries. There is no central,segmental, or subsegmental filling defects within the pulmonary arteries. There is mild cardiomegaly. Coronary artery calcifications are seen. No pericardial effusion or thickening. No evidence right heart strain. There is a patent four-vessel arch with a common origin of the left vertebral artery off the arch. Scattered aortic atherosclerosis is noted. Mediastinum/Nodes: No hilar, mediastinal, or axillary adenopathy. Thyroid gland, trachea, and esophagus demonstrate no significant findings. Lungs/Pleura: There is a small focal patchy opacity seen at the left lung base. The right lung is clear. No pleural effusion is seen. Upper Abdomen: No acute abnormalities present in the visualized portions of the upper abdomen. Musculoskeletal: No chest wall abnormality. No acute or significant osseous findings. Review  of the MIP images confirms the above findings. IMPRESSION: No central, segmental, or subsegmental pulmonary embolism. Small focal patchy area at the left lung base which could be due to atelectasis and/or early infectious etiology. Aortic Atherosclerosis (ICD10-I70.0). Electronically Signed   By: Jonna Clark M.D.   On: 11/17/2019 21:40   DG Chest Portable 1 View  Result Date: 11/17/2019 CLINICAL DATA:  Cough, chest pain, COVID positive. EXAM: PORTABLE CHEST 1 VIEW COMPARISON:  03/15/2019 FINDINGS: The cardiomediastinal contours are normal. Aortic atherosclerosis. Minimal subsegmental opacities in the  lung bases. Pulmonary vasculature is normal. No consolidation, pleural effusion, or pneumothorax. No acute osseous abnormalities are seen. IMPRESSION: Minimal subsegmental bibasilar opacities, may be atelectasis or atypical viral pneumonia in the setting of COVID. Aortic Atherosclerosis (ICD10-I70.0). Electronically Signed   By: Narda Rutherford M.D.   On: 11/17/2019 20:28    Scheduled Meds: . amLODipine  5 mg Oral Daily  . aspirin EC  81 mg Oral Daily  . atorvastatin  40 mg Oral Daily  . cloNIDine  0.1 mg Transdermal Weekly  . clopidogrel  75 mg Oral Daily  . colchicine  0.3 mg Oral Daily  . dexamethasone (DECADRON) injection  6 mg Intravenous Q24H  . dextromethorphan-guaiFENesin  1 tablet Oral BID  . donepezil  5 mg Oral Daily  . doxycycline  100 mg Oral BID  . fluticasone  2 spray Each Nare Daily  . gabapentin  100 mg Oral TID  . glimepiride  1 mg Oral Q breakfast  . insulin aspart  0-15 Units Subcutaneous TID WC  . isosorbide mononitrate  60 mg Oral Daily  . magnesium oxide  400 mg Oral Daily  . metoprolol tartrate  25 mg Oral BID  . multivitamin with minerals  1 tablet Oral Daily  . pantoprazole  40 mg Oral Daily  . timolol  1 drop Both Eyes QPM   Continuous Infusions: . heparin 600 Units/hr (11/19/19 0724)     LOS: 2 days   Time spent: 45 minutes. I personally reviewed her chart, EKG and images.  Arnetha Courser, MD [facility]  If 7PM-7AM, please contact night-coverage [email or web address]  11/19/2019, 11:46 AM   This record has been created using Conservation officer, historic buildings. Errors have been sought and corrected,but may not always be located. Such creation errors do not reflect on the standard of care.

## 2019-11-19 NOTE — ED Notes (Signed)
First call

## 2019-11-19 NOTE — ED Notes (Signed)
second call report att

## 2019-11-19 NOTE — Progress Notes (Signed)
Subjective:  Overall patient has felt much better since admission with no further episodes of chest discomfort since admission.  There has been no evidence of significant new telemetry findings.  EKG remains with normal sinus rhythm with left bundle branch block.  Troponin peak at 6149 consistent with non-ST elevation myocardial infarction.  Patient claims that breathing is reasonable at this time despite recent viral pneumonia of Covid.  Patient tolerating current medical regimen for treatment of non-ST elevation myocardial infarction without evidence of congestive heart failure type symptoms  Objective:  Vital Signs in the last 24 hours: Pulse Rate:  [62-89] 72 (08/01 0234) Resp:  [15-23] 20 (08/01 0234) BP: (109-165)/(48-88) 145/54 (08/01 0234) SpO2:  [92 %-97 %] 96 % (08/01 0234)  Intake/Output from previous day: No intake/output data recorded. Intake/Output from this shift: No intake/output data recorded.  Physical Exam: As per prime doc  Lab Results: Recent Labs    11/17/19 1936  WBC 14.3*  HGB 11.0*  PLT 289   Recent Labs    11/17/19 1936  NA 134*  K 3.8  CL 99  CO2 23  GLUCOSE 240*  BUN 26*  CREATININE 1.48*   No results for input(s): TROPONINI in the last 72 hours.  Invalid input(s): CK, MB Hepatic Function Panel No results for input(s): PROT, ALBUMIN, AST, ALT, ALKPHOS, BILITOT, BILIDIR, IBILI in the last 72 hours. Recent Labs    11/18/19 0647  CHOL 170   No results for input(s): PROTIME in the last 72 hours.  Imaging: Imaging results have been reviewed  Cardiac Studies:  Assessment/Plan:  86 year old female with known coronary artery disease status post previous stent placement hypertension hyperlipidemia diabetes chronic kidney disease stage III with acute viral pneumonia for Covid exacerbating an acute non-ST elevation myocardial infarction without current evidence of congestive heart failure 1.  Continue heparin for at least 24 more hours for  acute non-ST elevation myocardial infarction 2.  Dual antiplatelet therapy with aspirin and Plavix and would continue 3.  Echocardiogram pending 4.  Metoprolol isosorbide and calcium channel blocker for reduction of anginal symptoms 5.  Continue isolation and noninvasive treatment of non-ST elevation myocardial infarction at this time until further potential need in intervention 6.  Begin ambulation in room and follow for improvements and further need for adjustments of medications  Lamar Blinks 11/19/2019, 6:50 AM

## 2019-11-19 NOTE — ED Notes (Signed)
Lunch tray given. Call light at reach. No other needs found at this moment. °

## 2019-11-20 ENCOUNTER — Inpatient Hospital Stay
Admit: 2019-11-20 | Discharge: 2019-11-20 | Disposition: A | Discharge status: Home / Self Care | Payer: Medicare Other | Attending: Internal Medicine | Admitting: Internal Medicine

## 2019-11-20 LAB — CBC
HCT: 32.2 % — ABNORMAL LOW (ref 36.0–46.0)
Hemoglobin: 11.4 g/dL — ABNORMAL LOW (ref 12.0–15.0)
MCH: 33.6 pg (ref 26.0–34.0)
MCHC: 35.4 g/dL (ref 30.0–36.0)
MCV: 95 fL (ref 80.0–100.0)
Platelets: 321 10*3/uL (ref 150–400)
RBC: 3.39 MIL/uL — ABNORMAL LOW (ref 3.87–5.11)
RDW: 14.1 % (ref 11.5–15.5)
WBC: 14.1 10*3/uL — ABNORMAL HIGH (ref 4.0–10.5)
nRBC: 0 % (ref 0.0–0.2)

## 2019-11-20 LAB — COMPREHENSIVE METABOLIC PANEL
ALT: 14 U/L (ref 0–44)
AST: 20 U/L (ref 15–41)
Albumin: 3.4 g/dL — ABNORMAL LOW (ref 3.5–5.0)
Alkaline Phosphatase: 53 U/L (ref 38–126)
Anion gap: 8 (ref 5–15)
BUN: 42 mg/dL — ABNORMAL HIGH (ref 8–23)
CO2: 24 mmol/L (ref 22–32)
Calcium: 9.5 mg/dL (ref 8.9–10.3)
Chloride: 107 mmol/L (ref 98–111)
Creatinine, Ser: 1.48 mg/dL — ABNORMAL HIGH (ref 0.44–1.00)
GFR calc Af Amer: 37 mL/min — ABNORMAL LOW (ref >60)
GFR calc non Af Amer: 32 mL/min — ABNORMAL LOW (ref >60)
Glucose, Bld: 127 mg/dL — ABNORMAL HIGH (ref 70–99)
Potassium: 4.4 mmol/L (ref 3.5–5.1)
Sodium: 139 mmol/L (ref 135–145)
Total Bilirubin: 0.6 mg/dL (ref 0.3–1.2)
Total Protein: 6.5 g/dL (ref 6.5–8.1)

## 2019-11-20 LAB — GLUCOSE, CAPILLARY
Glucose-Capillary: 105 mg/dL — ABNORMAL HIGH (ref 70–99)
Glucose-Capillary: 181 mg/dL — ABNORMAL HIGH (ref 70–99)
Glucose-Capillary: 200 mg/dL — ABNORMAL HIGH (ref 70–99)

## 2019-11-20 LAB — HEPARIN LEVEL (UNFRACTIONATED): Heparin Unfractionated: 0.27 IU/mL — ABNORMAL LOW (ref 0.30–0.70)

## 2019-11-20 LAB — PROCALCITONIN: Procalcitonin: <0.1 ng/mL

## 2019-11-20 LAB — ECHOCARDIOGRAM COMPLETE
Height: 62 in
S' Lateral: 2.09 cm
Weight: 2186.96 oz

## 2019-11-20 LAB — C-REACTIVE PROTEIN: CRP: 2.9 mg/dL — ABNORMAL HIGH (ref <1.0)

## 2019-11-20 MED ORDER — ENOXAPARIN SODIUM 30 MG/0.3ML ~~LOC~~ SOLN
30.0000 mg | SUBCUTANEOUS | Status: DC
  Administered 2019-11-20: 30 mg via SUBCUTANEOUS
  Filled 2019-11-20: qty 0.3

## 2019-11-20 NOTE — Evaluation (Signed)
Physical Therapy Evaluation Patient Details Name: [name] MRN: [id number] DOB: 1930-07-29 Today's Date: 11/20/2019   History of Present Illness  86 y.o. female with known coronary disease status post previous stent placement hypertension hyperlipidemia diabetes chronic kidney disease presented to ED for substernal chest pain, SOB, cough. Workup positive for acute non-ST elevation myocardial infarction in the setting of Covid viral infection.    Clinical Impression  Patient alert, sitting on BSC. Pt reported at baseline independent, son performs driving/errands, uses RW in home, 2 falls in the last year. ModI/I ADLs, does do some laundry, cooking.  The patient demonstrated sit <> stand transfers with RW mod I, able to perform toileting activities modI as well. The patient ambulated ~33ft in room with RW and supervision/CGA, HR in 70s, pt denied chest pain but did endorse very slight dizziness that did not worsen over time. Decreased gait velocity noted with trunk flexed, but pt able to correct with awareness.  Overall the patient demonstrated deficits (see "PT Problem List") that impede the patient's functional abilities, safety, and mobility and would benefit from skilled PT intervention. Recommendation is HHPT to maximize endurance, balance, and functional mobility.      Follow Up Recommendations Home health PT    Equipment Recommendations  None recommended by PT    Recommendations for Other Services       Precautions / Restrictions Precautions Precautions: Fall Precaution Comments: watch HR Restrictions Weight Bearing Restrictions: No      Mobility  Bed Mobility               General bed mobility comments: deferred up on BSC  Transfers Overall transfer level: Modified independent Equipment used: Rolling walker (2 wheeled)                Ambulation/Gait Ambulation/Gait assistance: Supervision Gait Distance (Feet): 45 Feet Assistive device: Rolling walker  (2 wheeled) Gait Pattern/deviations: WFL(Within Functional Limits)     General Gait Details: decreased velocity, no unsteadiness noted, increased trunk flexion  Stairs            Wheelchair Mobility    Modified Rankin (Stroke Patients Only)       Balance Overall balance assessment: Needs assistance Sitting-balance support: Feet supported Sitting balance-Leahy Scale: Normal       Standing balance-Leahy Scale: Good                               Pertinent Vitals/Pain Pain Assessment: No/denies pain    Home Living Family/patient expects to be discharged to:: Private residence Living Arrangements: Children;Other relatives (disabled sister) Available Help at Discharge: Family;Available 24 hours/day Type of Home: House Home Access: Ramped entrance     Home Layout: One level Home Equipment: Shower seat - built in;Shower seat;Hand held shower head;Grab bars - tub/shower Additional Comments: HH Shower Head currently stuck. Plans to have son fix it.    Prior Function Level of Independence: Independent         Comments: Ind Amb mostly without an AD but occasionally uses a RW when fatigued, two falls in the last year, Ind with ADLs; Sponge bathes, is afraid of falling in her walk-in shower.     Hand Dominance        Extremity/Trunk Assessment   Upper Extremity Assessment Upper Extremity Assessment: Overall WFL for tasks assessed    Lower Extremity Assessment Lower Extremity Assessment: Overall WFL for tasks assessed    Cervical / Trunk  Assessment Cervical / Trunk Assessment: Kyphotic  Communication   Communication: No difficulties  Cognition Arousal/Alertness: Awake/alert Behavior During Therapy: WFL for tasks assessed/performed Overall Cognitive Status: Within Functional Limits for tasks assessed                                        General Comments      Exercises     Assessment/Plan    PT Assessment Patient  needs continued PT services  PT Problem List Decreased strength;Decreased cognition;Decreased range of motion;Decreased knowledge of use of DME;Decreased activity tolerance;Decreased balance;Decreased mobility;Decreased knowledge of precautions;Decreased safety awareness       PT Treatment Interventions DME instruction;Balance training;Gait training;Neuromuscular re-education;Stair training;Functional mobility training;Patient/family education;Therapeutic activities;Therapeutic exercise    PT Goals (Current goals can be found in the Care Plan section)  Acute Rehab PT Goals Patient Stated Goal: to go home PT Goal Formulation: With patient Time For Goal Achievement: 12/04/19 Potential to Achieve Goals: Good    Frequency Min 2X/week   Barriers to discharge        Co-evaluation               AM-PAC PT "6 Clicks" Mobility  Outcome Measure Help needed turning from your back to your side while in a flat bed without using bedrails?: None Help needed moving from lying on your back to sitting on the side of a flat bed without using bedrails?: None Help needed moving to and from a bed to a chair (including a wheelchair)?: None Help needed standing up from a chair using your arms (e.g., wheelchair or bedside chair)?: None Help needed to walk in hospital room?: A Little Help needed climbing 3-5 steps with a railing? : A Little 6 Click Score: 22    End of Session Equipment Utilized During Treatment: Gait belt Activity Tolerance: Patient tolerated treatment well Patient left: in chair;with call bell/phone within reach;with chair alarm set Nurse Communication: Mobility status PT Visit Diagnosis: Other abnormalities of gait and mobility (R26.89);Muscle weakness (generalized) (M62.81);Difficulty in walking, not elsewhere classified (R26.2)    Time: 1884-1660 PT Time Calculation (min) (ACUTE ONLY): 40 min   Charges:   PT Evaluation $PT Eval Low Complexity: 1 Low PT  Treatments $Therapeutic Exercise: 23-37 mins       Olga Coaster PT, DPT 3:06 PM,11/20/19

## 2019-11-20 NOTE — Progress Notes (Signed)
ANTICOAGULATION CONSULT NOTE  Pharmacy Consult for heparin Indication: chest pain/ACS  Allergies  Allergen Reactions   Butenafine Swelling    Tongue swelled and turned red   Levofloxacin     Other reaction(s): Other (See Comments) Tongue swelled and turned red   Limonene Other (See Comments)    Other reaction(s): Other (See Comments) Tongue swelled and turned red Tongue swelled and turned red    Terbinafine And Related Other (See Comments)    Tongue swelled and turned red    Patient Measurements: Height: 5\' 2"  (157.5 cm) Weight: 62 kg (136 lb 11 oz) IBW/kg (Calculated) : 50.1 Heparin Dosing Weight: 56 kg  Vital Signs: Temp: 98.3 F (36.8 C) (08/01 2057) Temp Source: Oral (08/01 2057) BP: 172/57 (08/01 2057) Pulse Rate: 68 (08/01 2057)  Labs: Recent Labs    11/17/19 1936 11/17/19 1936 11/17/19 2146 11/18/19 11/20/19 11/18/19 0933 11/18/19 1612 11/18/19 1612 11/18/19 1931 11/19/19 0059 11/19/19 0725 11/19/19 0932 11/20/19 0418  HGB 11.0*   < >  --   --   --   --   --   --   --  10.9*  --  11.4*  HCT 32.7*  --   --   --   --   --   --   --   --  31.8*  --  32.2*  PLT 289  --   --   --   --   --   --   --   --  292  --  321  APTT 38*  --   --   --   --   --   --   --   --   --   --   --   LABPROT 13.0  --   --   --   --   --   --   --   --   --   --   --   INR 1.0  --   --   --   --   --   --   --   --   --   --   --   HEPARINUNFRC  --   --    < >   < >  --  0.43   < >  --  0.32  --  0.38 0.27*  CREATININE 1.48*  --   --   --   --   --   --   --   --   --   --  1.48*  TROPONINIHS 138*  --    < >  --  01/20/20* 5,031*  --  3,927*  --   --   --   --    < > = values in this interval not displayed.    Estimated Creatinine Clearance: 23.6 mL/min (A) (by C-G formula based on SCr of 1.48 mg/dL (H)).   Medical History: Past Medical History:  Diagnosis Date   Hypercholesteremia    Hypertension    Myocardial infarction (HCC) 2008   S/P angioplasty with stent      Medications:  Scheduled:   amLODipine  5 mg Oral Daily   aspirin EC  81 mg Oral Daily   atorvastatin  40 mg Oral Daily   cloNIDine  0.1 mg Transdermal Weekly   clopidogrel  75 mg Oral Daily   colchicine  0.3 mg Oral Daily   dexamethasone (DECADRON) injection  6 mg Intravenous Q24H   dextromethorphan-guaiFENesin  1 tablet Oral BID   donepezil  5 mg Oral Daily   doxycycline  100 mg Oral BID   fluticasone  2 spray Each Nare Daily   gabapentin  100 mg Oral TID   glimepiride  1 mg Oral Q breakfast   insulin aspart  0-15 Units Subcutaneous TID WC   insulin aspart  4 Units Subcutaneous TID WC   isosorbide mononitrate  60 mg Oral Daily   magnesium oxide  400 mg Oral Daily   metoprolol tartrate  25 mg Oral BID   multivitamin with minerals  1 tablet Oral Daily   pantoprazole  40 mg Oral Daily   timolol  1 drop Both Eyes QPM    Assessment: Patient admitted for CP w/ trop of 138 >> 997, EKG showing LBBB, no anticoagulation PTA. Baseline CBC WNL for patient aPTT/INR pending. Patient being started on heparin drip for NSTEMI.  7/31 0647 HL 0.94  8/1 0059 HL 0.32 8/1 0932 HL 0.38   Goal of Therapy:  Heparin level 0.3-0.7 units/ml Monitor platelets by anticoagulation protocol: Yes   Plan:  08/02 @ 0400 HL 0.27 subtherapeutic. Per RN drip had been running w/o interruptions. Will increase rate to 700 units/hr and will recheck HL at 1400, CBC stable will continue to monitor.  Thomasene Ripple, PharmD, BCPS Clinical Pharmacist 11/20/2019,5:56 AM

## 2019-11-20 NOTE — Care Management Important Message (Signed)
Important Message  Patient Details  Name: [name] MRN: [id number] Date of Birth: January 18, 1931   Medicare Important Message Given:  N/A - LOS <3 / Initial given by admissions     [name] 11/20/2019, 12:09 PM

## 2019-11-20 NOTE — Progress Notes (Signed)
PROGRESS NOTE    [name]  QQP:[id number] DOB: 1930/05/05 DOA: 11/17/2019 PCP: Danella Penton, MD   Brief Narrative:  [name] is a 86 y.o. female with medical history significant of coronary artery disease, diabetes, hypertension, status post previous stent, hyperlipidemia who was recently diagnosed with COVID-19 infection about 4 days ago.  Patient has received the Pfizer vaccine.  She came in with substernal chest pain.  Associated with some cough and shortness of breath. Patient received 1 dose of monoclonal antibody from and was started on Decadron and doxycycline. Found to have new onset left bundle branch block and elevated troponin, admitted for NSTEMI. Cardiology was consulted.  Subjective: She denies any chest pain.  No shortness of breath.  Assessment & Plan:   Principal Problem:   NSTEMI (non-ST elevated myocardial infarction) (HCC) Active Problems:   CAD (coronary artery disease)   Hyperlipidemia   Type 2 diabetes mellitus (HCC)   CKD (chronic kidney disease) stage 3, GFR 30-59 ml/min   GERD (gastroesophageal reflux disease)   Essential (primary) hypertension   Hyperlipidemia, mixed  NSTEMI.Patient with new onset left bundle branch block with elevated troponin, makes her NSTEMI. Has an history of recent stent placement.  Troponin peaked at 6149, now trending down. No chest pain today.  Cardiology was consulted-appreciate their recommendations. -Completed 48 hours of heparin infusion. -Continue DAPT with aspirin and Plavix. -Continue beta-blocker and nitroglycerin. -Echo with normal EF, no regional wall motion abnormalities and grade 1 diastolic dysfunction. -Cardiology would like to keep her for another day. -Cardiac rehab.  COVID-19 infection. Patient completed her vaccination with Pfizer. Only having a cough, no x-ray changes, CTA with no PE but a small possible infiltrate in right lung base, may be early infection. No hypoxia. She did received one-time  dose of monoclonal antibodies at his PCP office and started on Decadron and doxycycline. Her symptoms started couple of days after attending church last Sunday. -Continue steroid for 10 days total. -Continue doxycycline-for total of 5 days. -Check pro Calcitonin -Check CRP-elevated at 2.9. -Continue isolation. -No need for remdesivir at this time.  Essential hypertension. Blood pressure mildly elevated. -Continue home medications with clonidine, amlodipine along with metoprolol and Imdur.  GERD. -Continue PPI.  Type 2 diabetes. CBG within goal. Patient is on steroid now. -Continue mealtime coverage of 4 units. -Continue SSI. -Continue Amaryl.  History of dementia. No acute concern -Continue home dose of Aricept.  Objective: Vitals:   11/20/19 0300 11/20/19 0600 11/20/19 0624 11/20/19 0830  BP:  126/72 (!) 176/59 (!) 132/56  Pulse:  63 62 60  Resp:  16 16 18   Temp:  98.6 F (37 C) 98.2 F (36.8 C) 98.4 F (36.9 C)  TempSrc:  Oral Oral Oral  SpO2:  98% 97% 95%  Weight: 62 kg     Height:        Intake/Output Summary (Last 24 hours) at 11/20/2019 1508 Last data filed at 11/20/2019 0203 Gross per 24 hour  Intake 269.72 ml  Output --  Net 269.72 ml   Filed Weights   11/17/19 1932 11/19/19 2057 11/20/19 0300  Weight: 64.9 kg 62 kg 62 kg    Examination:  General.  Pleasant elderly lady, in no acute distress. Pulmonary.  Lungs clear bilaterally, normal respiratory effort. CV.  Regular rate and rhythm, no JVD, rub or murmur. Abdomen.  Soft, nontender, nondistended, BS positive. CNS.  Alert and oriented x3.  No focal neurologic deficit. Extremities.  No edema, no cyanosis, pulses  intact and symmetrical. Psychiatry.  Judgment and insight appears normal.  DVT prophylaxis: Heparin Code Status: Full Family Communication: Husband was updated on phone. Disposition Plan:  Status is: Inpatient  Remains inpatient appropriate because:Inpatient level of care appropriate due  to severity of illness   Dispo: The patient is from: Home              Anticipated d/c is to: Home              Anticipated d/c date is: 1 day              Patient currently is not medically stable to d/c.  Consultants:   Cardiology  Procedures:  Antimicrobials:   Data Reviewed: I have personally reviewed following labs and imaging studies  CBC: Recent Labs  Lab 11/17/19 1936 11/19/19 0725 11/20/19 0418  WBC 14.3* 14.4* 14.1*  NEUTROABS 13.4*  --   --   HGB 11.0* 10.9* 11.4*  HCT 32.7* 31.8* 32.2*  MCV 97.6 96.4 95.0  PLT 289 292 321   Basic Metabolic Panel: Recent Labs  Lab 11/17/19 1936 11/20/19 0418  NA 134* 139  K 3.8 4.4  CL 99 107  CO2 23 24  GLUCOSE 240* 127*  BUN 26* 42*  CREATININE 1.48* 1.48*  CALCIUM 8.8* 9.5   GFR: Estimated Creatinine Clearance: 23.6 mL/min (A) (by C-G formula based on SCr of 1.48 mg/dL (H)). Liver Function Tests: Recent Labs  Lab 11/20/19 0418  AST 20  ALT 14  ALKPHOS 53  BILITOT 0.6  PROT 6.5  ALBUMIN 3.4*   No results for input(s): LIPASE, AMYLASE in the last 168 hours. No results for input(s): AMMONIA in the last 168 hours. Coagulation Profile: Recent Labs  Lab 11/17/19 1936  INR 1.0   Cardiac Enzymes: No results for input(s): CKTOTAL, CKMB, CKMBINDEX, TROPONINI in the last 168 hours. BNP (last 3 results) No results for input(s): PROBNP in the last 8760 hours. HbA1C: Recent Labs    11/18/19 1612  HGBA1C 6.5*   CBG: Recent Labs  Lab 11/19/19 0802 11/19/19 1158 11/19/19 1700 11/19/19 2101 11/20/19 0826  GLUCAP 128* 208* 144* 135* 105*   Lipid Profile: Recent Labs    11/18/19 0647  CHOL 170  HDL 52  LDLCALC 100*  TRIG 92  CHOLHDL 3.3   Thyroid Function Tests: No results for input(s): TSH, T4TOTAL, FREET4, T3FREE, THYROIDAB in the last 72 hours. Anemia Panel: No results for input(s): VITAMINB12, FOLATE, FERRITIN, TIBC, IRON, RETICCTPCT in the last 72 hours. Sepsis Labs: Recent Labs  Lab  11/19/19 1655 11/20/19 0418  PROCALCITON <0.10 <0.10    No results found for this or any previous visit (from the past 240 hour(s)).   Radiology Studies: ECHOCARDIOGRAM COMPLETE  Result Date: 11/20/2019    ECHOCARDIOGRAM REPORT   Patient Name:   [name] Date of Exam: 11/20/2019 Medical Rec #:  [id number]     Height:       62.0 in Accession #:    [id number]    Weight:       136.7 lb Date of Birth:  1931-01-30     BSA:          1.626 m Patient Age:    88 years      BP:           176/59 mmHg Patient Gender: F             HR:  62 bpm. Exam Location:  [facility] Procedure: Cardiac Doppler and Color Doppler Indications:     Abnormal ECG 794.31  History:         Patient has no prior history of Echocardiogram examinations.                  Previous Myocardial Infarction; Risk Factors:Hypertension. S/P                  angioplast with stent.  Sonographer:     Cristela Blue RDCS (AE) Referring Phys:  [id number] Gardiner Ramus MATHEWS Diagnosing Phys: Harold Hedge MD  Sonographer Comments: No apical window. IMPRESSIONS  1. Left ventricular ejection fraction, by estimation, is 60 to 65%. Left ventricular ejection fraction by PLAX is 69 %. The left ventricle has normal function. The left ventricle has no regional wall motion abnormalities. There is mild left ventricular hypertrophy. Left ventricular diastolic parameters are consistent with Grade I diastolic dysfunction (impaired relaxation).  2. Right ventricular systolic function is normal. The right ventricular size is mildly enlarged.  3. The mitral valve was not well visualized. Trivial mitral valve regurgitation.  4. The aortic valve is grossly normal. Aortic valve regurgitation is not visualized. FINDINGS  Left Ventricle: Left ventricular ejection fraction, by estimation, is 60 to 65%. Left ventricular ejection fraction by PLAX is 69 %. The left ventricle has normal function. The left ventricle has no regional wall motion abnormalities. The left ventricular internal  cavity size was normal in size. There is mild left ventricular hypertrophy. Left ventricular diastolic parameters are consistent with Grade I diastolic dysfunction (impaired relaxation). Right Ventricle: The right ventricular size is mildly enlarged. No increase in right ventricular wall thickness. Right ventricular systolic function is normal. Left Atrium: Left atrial size was normal in size. Right Atrium: Right atrial size was normal in size. Pericardium: There is no evidence of pericardial effusion. Mitral Valve: The mitral valve was not well visualized. Trivial mitral valve regurgitation. Tricuspid Valve: The tricuspid valve is not well visualized. Tricuspid valve regurgitation is mild. Aortic Valve: The aortic valve is grossly normal. Aortic valve regurgitation is not visualized. Pulmonic Valve: The pulmonic valve was not well visualized. Pulmonic valve regurgitation is mild. Aorta: The aortic root was not well visualized. IAS/Shunts: The interatrial septum was not assessed.  LEFT VENTRICLE PLAX 2D LV EF:         Left ventricular ejection fraction by PLAX is 69 %. LVIDd:         3.35 cm LVIDs:         2.09 cm LV PW:         1.43 cm LV IVS:        1.27 cm LVOT diam:     2.00 cm LVOT Area:     3.14 cm  LEFT ATRIUM         Index LA diam:    3.00 cm 1.84 cm/m                        PULMONIC VALVE AORTA                 PV Vmax:        0.72 m/s Ao Root diam: 2.80 cm PV Peak grad:   2.1 mmHg                       RVOT Peak grad: 4 mmHg   SHUNTS Systemic Diam: 2.00 cm Harold Hedge  MD Electronically signed by Harold Hedge MD Signature Date/Time: 11/20/2019/1:02:21 PM    Final     Scheduled Meds: . amLODipine  5 mg Oral Daily  . aspirin EC  81 mg Oral Daily  . atorvastatin  40 mg Oral Daily  . cloNIDine  0.1 mg Transdermal Weekly  . clopidogrel  75 mg Oral Daily  . colchicine  0.3 mg Oral Daily  . dexamethasone (DECADRON) injection  6 mg Intravenous Q24H  . dextromethorphan-guaiFENesin  1 tablet Oral BID  .  donepezil  5 mg Oral Daily  . doxycycline  100 mg Oral BID  . enoxaparin (LOVENOX) injection  30 mg Subcutaneous Q24H  . fluticasone  2 spray Each Nare Daily  . gabapentin  100 mg Oral TID  . glimepiride  1 mg Oral Q breakfast  . insulin aspart  0-15 Units Subcutaneous TID WC  . insulin aspart  4 Units Subcutaneous TID WC  . isosorbide mononitrate  60 mg Oral Daily  . magnesium oxide  400 mg Oral Daily  . metoprolol tartrate  25 mg Oral BID  . multivitamin with minerals  1 tablet Oral Daily  . pantoprazole  40 mg Oral Daily  . timolol  1 drop Both Eyes QPM   Continuous Infusions:    LOS: 3 days   Time spent: 35 minutes.  Arnetha Courser, MD [facility]  If 7PM-7AM, please contact night-coverage [email or web address]  11/20/2019, 3:08 PM   This record has been created using Conservation officer, historic buildings. Errors have been sought and corrected,but may not always be located. Such creation errors do not reflect on the standard of care.

## 2019-11-20 NOTE — Progress Notes (Signed)
*  PRELIMINARY RESULTS* Echocardiogram 2D Echocardiogram has been performed.  [name] 11/20/2019, 9:17 AM
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
## 2019-11-20 NOTE — Progress Notes (Signed)
[facility] Cardiology [facility] Encounter Note  Patient: [name] / Admit Date: 11/17/2019 / Date of Encounter: 11/20/2019, 12:53 PM   Subjective: Patient overall feeling much better since admission with no evidence of significant episodes of chest discomfort.  Troponin peaked at 6149 consistent with non-ST ovation myocardial infarction.  Patient does still have a cough and congestion but no evidence of significant problems with this consistent with worsening pneumonia.  The patient has had abnormal EKG but telemetry shows normal sinus rhythm with bundle branch block.  She has been placed on more than 48 hours of heparin and improved from cardiovascular standpoint and started on isosorbide metoprolol Plavix and aspirin with amlodipine and high intensity cholesterol therapy for coronary artery disease and tolerated well  Review of Systems: Positive for: Cough congestion Negative for: Vision change, hearing change, syncope, dizziness, nausea, vomiting,diarrhea, bloody stool, stomach pain, positive for cough, congestion, negative for diaphoresis, urinary frequency, urinary pain,skin lesions, skin rashes Others previously listed  Objective: Telemetry: Normal sinus rhythm with bundle branch block Physical Exam: As per prime doc  Intake/Output Summary (Last 24 hours) at 11/20/2019 1253 Last data filed at 11/20/2019 0203 Gross per 24 hour  Intake 269.72 ml  Output 1000 ml  Net -730.28 ml    Inpatient Medications:  . amLODipine  5 mg Oral Daily  . aspirin EC  81 mg Oral Daily  . atorvastatin  40 mg Oral Daily  . cloNIDine  0.1 mg Transdermal Weekly  . clopidogrel  75 mg Oral Daily  . colchicine  0.3 mg Oral Daily  . dexamethasone (DECADRON) injection  6 mg Intravenous Q24H  . dextromethorphan-guaiFENesin  1 tablet Oral BID  . donepezil  5 mg Oral Daily  . doxycycline  100 mg Oral BID  . enoxaparin (LOVENOX) injection  30 mg Subcutaneous Q24H  . fluticasone  2 spray Each Nare Daily  .  gabapentin  100 mg Oral TID  . glimepiride  1 mg Oral Q breakfast  . insulin aspart  0-15 Units Subcutaneous TID WC  . insulin aspart  4 Units Subcutaneous TID WC  . isosorbide mononitrate  60 mg Oral Daily  . magnesium oxide  400 mg Oral Daily  . metoprolol tartrate  25 mg Oral BID  . multivitamin with minerals  1 tablet Oral Daily  . pantoprazole  40 mg Oral Daily  . timolol  1 drop Both Eyes QPM   Infusions:   Labs: Recent Labs    11/17/19 1936 11/20/19 0418  NA 134* 139  K 3.8 4.4  CL 99 107  CO2 23 24  GLUCOSE 240* 127*  BUN 26* 42*  CREATININE 1.48* 1.48*  CALCIUM 8.8* 9.5   Recent Labs    11/20/19 0418  AST 20  ALT 14  ALKPHOS 53  BILITOT 0.6  PROT 6.5  ALBUMIN 3.4*   Recent Labs    11/17/19 1936 11/17/19 1936 11/19/19 0725 11/20/19 0418  WBC 14.3*   < > 14.4* 14.1*  NEUTROABS 13.4*  --   --   --   HGB 11.0*   < > 10.9* 11.4*  HCT 32.7*   < > 31.8* 32.2*  MCV 97.6   < > 96.4 95.0  PLT 289   < > 292 321   < > = values in this interval not displayed.   No results for input(s): CKTOTAL, CKMB, TROPONINI in the last 72 hours. Invalid input(s): POCBNP Recent Labs    11/18/19 1612  HGBA1C 6.5*  Weights: Filed Weights   11/17/19 1932 11/19/19 2057 11/20/19 0300  Weight: 64.9 kg 62 kg 62 kg     Radiology/Studies:  CT Angio Chest PE W and/or Wo Contrast  Result Date: 11/17/2019 CLINICAL DATA:  Positive COVID, cough EXAM: CT ANGIOGRAPHY CHEST WITH CONTRAST TECHNIQUE: Multidetector CT imaging of the chest was performed using the standard protocol during bolus administration of intravenous contrast. Multiplanar CT image reconstructions and MIPs were obtained to evaluate the vascular anatomy. CONTRAST:  87mL OMNIPAQUE IOHEXOL 350 MG/ML SOLN COMPARISON:  March 30, 2019 FINDINGS: Cardiovascular: There is a optimal opacification of the pulmonary arteries. There is no central,segmental, or subsegmental filling defects within the pulmonary arteries.  There is mild cardiomegaly. Coronary artery calcifications are seen. No pericardial effusion or thickening. No evidence right heart strain. There is a patent four-vessel arch with a common origin of the left vertebral artery off the arch. Scattered aortic atherosclerosis is noted. Mediastinum/Nodes: No hilar, mediastinal, or axillary adenopathy. Thyroid gland, trachea, and esophagus demonstrate no significant findings. Lungs/Pleura: There is a small focal patchy opacity seen at the left lung base. The right lung is clear. No pleural effusion is seen. Upper Abdomen: No acute abnormalities present in the visualized portions of the upper abdomen. Musculoskeletal: No chest wall abnormality. No acute or significant osseous findings. Review of the MIP images confirms the above findings. IMPRESSION: No central, segmental, or subsegmental pulmonary embolism. Small focal patchy area at the left lung base which could be due to atelectasis and/or early infectious etiology. Aortic Atherosclerosis (ICD10-I70.0). Electronically Signed   By: Jonna Clark M.D.   On: 11/17/2019 21:40   DG Chest Portable 1 View  Result Date: 11/17/2019 CLINICAL DATA:  Cough, chest pain, COVID positive. EXAM: PORTABLE CHEST 1 VIEW COMPARISON:  03/15/2019 FINDINGS: The cardiomediastinal contours are normal. Aortic atherosclerosis. Minimal subsegmental opacities in the lung bases. Pulmonary vasculature is normal. No consolidation, pleural effusion, or pneumothorax. No acute osseous abnormalities are seen. IMPRESSION: Minimal subsegmental bibasilar opacities, may be atelectasis or atypical viral pneumonia in the setting of COVID. Aortic Atherosclerosis (ICD10-I70.0). Electronically Signed   By: Narda Rutherford M.D.   On: 11/17/2019 20:28     Assessment and Recommendation  86 y.o. female with known coronary disease status post previous stent placement hypertension hyperlipidemia diabetes chronic kidney disease with acute non-ST elevation  myocardial infarction in the setting of Covid viral infection improved clinically on appropriate medication management without evidence of congestive heart failure 1.  Continue supportive care of viral infection 2.  Echocardiogram pending at this time for further evaluation of LV systolic dysfunction and adjustments of medication 3.  Heparin discontinuation today with continuation of dual antiplatelet therapy for non-ST elevation myocardial infarction 4.  Continue medication management for myocardial infarction hypertension control including isosorbide of beta-blocker amlodipine.  Would use ACE inhibitor if able 5.  Begin ambulation and follow for improvements of symptoms and possible discharge home if patient is improved.  If further concerns can further evaluate by invasive procedures if necessary  Signed, Arnoldo Hooker M.D. FACC

## 2019-11-20 NOTE — Discharge Summary (Signed)
Physician Discharge Summary  [name] FTD:[id number] DOB: 1930/04/25 DOA: 11/17/2019  PCP: Danella Penton, MD  Admit date: 11/17/2019 Discharge date: 11/21/2019  Admitted From: Home Disposition: Home   Recommendations for Outpatient Follow-up:  1. Follow up with PCP in 1-2 weeks 2. Follow-up with cardiology. 3. Please obtain BMP/CBC in one week 4. Please follow up on the following pending results: None  Home Health: Yes Equipment/Devices: None  discharge Condition: Stable CODE STATUS: Full Diet recommendation: Heart Healthy / Carb Modified   Brief/Interim Summary: Mary Mehlman Pageis a 86 y.o.femalewith medical history significant ofcoronary artery disease, diabetes, hypertension, status post previous stent, hyperlipidemia who was recently diagnosed with COVID-19 infection about 4 days ago. Patient has received the Pfizer vaccine. She came in with substernal chest pain. Associated with some cough and shortness of breath. Patient received 1 dose of monoclonal antibody from and was started on steroid and doxycycline.  She completed a course of doxycycline during current hospitalization and was given a prescription of Decadron for 5 more days.  Found to have new onset left bundle branch block and elevated troponin, admitted for NSTEMI. Cardiology was consulted.  They recommended medical management and she was treated with 48 hours of heparin infusion.  She was also started on metoprolol and will continue with aspirin and Plavix. She will continue with rest of her home medications and follow-up with her cardiologist for further recommendations.  Patient did not had much upper respiratory symptoms.  Chest x-ray without any acute changes.  She did not need any remdesivir.  We will continue rest of her home meds and follow-up with her primary care provider.  Discharge Diagnoses:  Principal Problem:   NSTEMI (non-ST elevated myocardial infarction) (HCC) Active Problems:   CAD  (coronary artery disease)   Hyperlipidemia   Type 2 diabetes mellitus (HCC)   CKD (chronic kidney disease) stage 3, GFR 30-59 ml/min   GERD (gastroesophageal reflux disease)   Essential (primary) hypertension   Hyperlipidemia, mixed   Discharge Instructions  Discharge Instructions    AMB Referral to Phase II Cardiac Rehab   Complete by: As directed    Diagnosis: NSTEMI   After initial evaluation and assessments completed: Virtual Based Care may be provided alone or in conjunction with Phase 2 Cardiac Rehab based on patient barriers.: Yes   Diet - low sodium heart healthy   Complete by: As directed    Discharge instructions   Complete by: As directed    It was pleasure taking care of you. You are being started on metoprolol by your cardiologist. Please follow-up with your cardiologist within 1 week. Continue using Decadron for 5 more days. You completed the course of doxycycline.   Increase activity slowly   Complete by: As directed      Allergies as of 11/21/2019      Reactions   Butenafine Swelling   Tongue swelled and turned red   Levofloxacin    Other reaction(s): Other (See Comments) Tongue swelled and turned red   Limonene Other (See Comments)   Other reaction(s): Other (See Comments) Tongue swelled and turned red Tongue swelled and turned red   Terbinafine And Related Other (See Comments)   Tongue swelled and turned red      Medication List    STOP taking these medications   doxycycline 100 MG capsule Commonly known as: VIBRAMYCIN   predniSONE 20 MG tablet Commonly known as: DELTASONE     TAKE these medications   acetaminophen 500 MG tablet  Commonly known as: TYLENOL Take 1,000 mg by mouth every 6 (six) hours as needed. 1-2 tabs daily as needed For pain   amLODipine 5 MG tablet Commonly known as: NORVASC Take 5 mg by mouth daily.   aspirin EC 81 MG tablet Take 81 mg by mouth daily.   cloNIDine 0.1 mg/24hr patch Commonly known as: CATAPRES - Dosed  in mg/24 hr 0.1 mg once a week.   clopidogrel 75 MG tablet Commonly known as: PLAVIX Take 75 mg by mouth daily.   colchicine 0.6 MG tablet Take 0.6 mg by mouth daily.   dexamethasone 6 MG tablet Commonly known as: DECADRON Take 1 tablet (6 mg total) by mouth daily for 5 days.   dextromethorphan-guaiFENesin 30-600 MG 12hr tablet Commonly known as: MUCINEX DM Take 1 tablet by mouth 2 (two) times daily.   donepezil 5 MG tablet Commonly known as: ARICEPT Take 5 mg by mouth daily.   fluticasone 50 MCG/ACT nasal spray Commonly known as: FLONASE Place 2 sprays into both nostrils daily.   gabapentin 100 MG capsule Commonly known as: NEURONTIN Take 100 mg by mouth 3 (three) times daily.   glimepiride 1 MG tablet Commonly known as: AMARYL Take 1 mg by mouth daily with breakfast.   isosorbide mononitrate 60 MG 24 hr tablet Commonly known as: IMDUR Take 60 mg by mouth daily.   magnesium oxide 400 MG tablet Commonly known as: MAG-OX Take 400 mg by mouth daily.   metoprolol tartrate 25 MG tablet Commonly known as: LOPRESSOR Take 1 tablet (25 mg total) by mouth 2 (two) times daily.   Multivitamin Gummies Adult Chew Chew 1 tablet by mouth daily.   nitroGLYCERIN 0.4 MG SL tablet Commonly known as: NITROSTAT Place 0.4 mg under the tongue every 5 (five) minutes as needed. For chest pain   omeprazole 20 MG capsule Commonly known as: PRILOSEC Take 20 mg by mouth daily.   polyethylene glycol 17 g packet Commonly known as: MiraLax Take 17 g by mouth daily as needed for mild constipation.   rosuvastatin 20 MG tablet Commonly known as: CRESTOR Take 20 mg by mouth daily.   spironolactone 25 MG tablet Commonly known as: ALDACTONE Take 25 mg by mouth daily.   timolol 0.5 % ophthalmic solution Commonly known as: BETIMOL Place 1 drop into both eyes every evening.   tizanidine 2 MG capsule Commonly known as: ZANAFLEX Take 2 mg by mouth 3 (three) times daily as needed for  muscle spasms.   torsemide 20 MG tablet Commonly known as: DEMADEX Take 30 mg by mouth every evening.       Follow-up Information    [facility]   Specialty: Home Health Services Why: Physical Therapy, Occupational Therapy       Paraschos, Alexander, MD. Schedule an appointment as soon as possible for a visit.   Specialty: Cardiology Why: To be seen within 1 week Contact information: [address] [facility] [place] [address] [phone]        Danella Penton, MD. Schedule an appointment as soon as possible for a visit.   Specialty: Internal Medicine Contact information: [facility] [address] [place] [address] [phone]              Allergies  Allergen Reactions  . Butenafine Swelling    Tongue swelled and turned red  . Levofloxacin     Other reaction(s): Other (See Comments) Tongue swelled and turned red  . Limonene  Other (See Comments)    Other reaction(s): Other (See Comments) Tongue swelled and turned red Tongue swelled and turned red   . Terbinafine And Related Other (See Comments)    Tongue swelled and turned red    Consultations:  Cardiology  Procedures/Studies: CT Angio Chest PE W and/or Wo Contrast  Result Date: 11/17/2019 CLINICAL DATA:  Positive COVID, cough EXAM: CT ANGIOGRAPHY CHEST WITH CONTRAST TECHNIQUE: Multidetector CT imaging of the chest was performed using the standard protocol during bolus administration of intravenous contrast. Multiplanar CT image reconstructions and MIPs were obtained to evaluate the vascular anatomy. CONTRAST:  41mL OMNIPAQUE IOHEXOL 350 MG/ML SOLN COMPARISON:  March 30, 2019 FINDINGS: Cardiovascular: There is a optimal opacification of the pulmonary arteries. There is no central,segmental, or subsegmental filling defects within the pulmonary arteries. There is mild cardiomegaly. Coronary artery calcifications are seen. No  pericardial effusion or thickening. No evidence right heart strain. There is a patent four-vessel arch with a common origin of the left vertebral artery off the arch. Scattered aortic atherosclerosis is noted. Mediastinum/Nodes: No hilar, mediastinal, or axillary adenopathy. Thyroid gland, trachea, and esophagus demonstrate no significant findings. Lungs/Pleura: There is a small focal patchy opacity seen at the left lung base. The right lung is clear. No pleural effusion is seen. Upper Abdomen: No acute abnormalities present in the visualized portions of the upper abdomen. Musculoskeletal: No chest wall abnormality. No acute or significant osseous findings. Review of the MIP images confirms the above findings. IMPRESSION: No central, segmental, or subsegmental pulmonary embolism. Small focal patchy area at the left lung base which could be due to atelectasis and/or early infectious etiology. Aortic Atherosclerosis (ICD10-I70.0). Electronically Signed   By: Jonna Clark M.D.   On: 11/17/2019 21:40   DG Chest Portable 1 View  Result Date: 11/17/2019 CLINICAL DATA:  Cough, chest pain, COVID positive. EXAM: PORTABLE CHEST 1 VIEW COMPARISON:  03/15/2019 FINDINGS: The cardiomediastinal contours are normal. Aortic atherosclerosis. Minimal subsegmental opacities in the lung bases. Pulmonary vasculature is normal. No consolidation, pleural effusion, or pneumothorax. No acute osseous abnormalities are seen. IMPRESSION: Minimal subsegmental bibasilar opacities, may be atelectasis or atypical viral pneumonia in the setting of COVID. Aortic Atherosclerosis (ICD10-I70.0). Electronically Signed   By: Narda Rutherford M.D.   On: 11/17/2019 20:28   ECHOCARDIOGRAM COMPLETE  Result Date: 11/20/2019    ECHOCARDIOGRAM REPORT   Patient Name:   [name] Date of Exam: 11/20/2019 Medical Rec #:  [id number]     Height:       62.0 in Accession #:    [id number]    Weight:       136.7 lb Date of Birth:  09/05/1930     BSA:           1.626 m Patient Age:    86 years      BP:           176/59 mmHg Patient Gender: F             HR:           62 bpm. Exam Location:  [facility] Procedure: Cardiac Doppler and Color Doppler Indications:     Abnormal ECG 794.31  History:         Patient has no prior history of Echocardiogram examinations.                  Previous Myocardial Infarction; Risk Factors:Hypertension. S/P  angioplast with stent.  Sonographer:     Cristela Blue RDCS (AE) Referring Phys:  [id number] Gardiner Ramus MATHEWS Diagnosing Phys: Harold Hedge MD  Sonographer Comments: No apical window. IMPRESSIONS  1. Left ventricular ejection fraction, by estimation, is 60 to 65%. Left ventricular ejection fraction by PLAX is 69 %. The left ventricle has normal function. The left ventricle has no regional wall motion abnormalities. There is mild left ventricular hypertrophy. Left ventricular diastolic parameters are consistent with Grade I diastolic dysfunction (impaired relaxation).  2. Right ventricular systolic function is normal. The right ventricular size is mildly enlarged.  3. The mitral valve was not well visualized. Trivial mitral valve regurgitation.  4. The aortic valve is grossly normal. Aortic valve regurgitation is not visualized. FINDINGS  Left Ventricle: Left ventricular ejection fraction, by estimation, is 60 to 65%. Left ventricular ejection fraction by PLAX is 69 %. The left ventricle has normal function. The left ventricle has no regional wall motion abnormalities. The left ventricular internal cavity size was normal in size. There is mild left ventricular hypertrophy. Left ventricular diastolic parameters are consistent with Grade I diastolic dysfunction (impaired relaxation). Right Ventricle: The right ventricular size is mildly enlarged. No increase in right ventricular wall thickness. Right ventricular systolic function is normal. Left Atrium: Left atrial size was normal in size. Right Atrium: Right atrial size was normal  in size. Pericardium: There is no evidence of pericardial effusion. Mitral Valve: The mitral valve was not well visualized. Trivial mitral valve regurgitation. Tricuspid Valve: The tricuspid valve is not well visualized. Tricuspid valve regurgitation is mild. Aortic Valve: The aortic valve is grossly normal. Aortic valve regurgitation is not visualized. Pulmonic Valve: The pulmonic valve was not well visualized. Pulmonic valve regurgitation is mild. Aorta: The aortic root was not well visualized. IAS/Shunts: The interatrial septum was not assessed.  LEFT VENTRICLE PLAX 2D LV EF:         Left ventricular ejection fraction by PLAX is 69 %. LVIDd:         3.35 cm LVIDs:         2.09 cm LV PW:         1.43 cm LV IVS:        1.27 cm LVOT diam:     2.00 cm LVOT Area:     3.14 cm  LEFT ATRIUM         Index LA diam:    3.00 cm 1.84 cm/m                        PULMONIC VALVE AORTA                 PV Vmax:        0.72 m/s Ao Root diam: 2.80 cm PV Peak grad:   2.1 mmHg                       RVOT Peak grad: 4 mmHg   SHUNTS Systemic Diam: 2.00 cm Harold Hedge MD Electronically signed by Harold Hedge MD Signature Date/Time: 11/20/2019/1:02:21 PM    Final     Subjective: Patient was feeling better when seen today.  No chest pain.  No shortness of breath.  Discharge Exam: Vitals:   11/21/19 0845 11/21/19 1142  BP: (!) 112/91 (!) 166/53  Pulse: (!) 51 73  Resp: 18 17  Temp: 97.9 F (36.6 C) 97.8 F (36.6 C)  SpO2: 98% 100%   Vitals:  11/20/19 2040 11/21/19 0522 11/21/19 0845 11/21/19 1142  BP: (!) 155/55 (!) 156/49 (!) 112/91 (!) 166/53  Pulse:  (!) 54 (!) 51 73  Resp:  16 18 17   Temp:  98.7 F (37.1 C) 97.9 F (36.6 C) 97.8 F (36.6 C)  TempSrc:  Oral Oral   SpO2:  96% 98% 100%  Weight:      Height:        General: Pt is alert, awake, not in acute distress Cardiovascular: RRR, S1/S2 +, no rubs, no gallops Respiratory: CTA bilaterally, no wheezing, no rhonchi Abdominal: Soft, NT, ND, bowel sounds  + Extremities: no edema, no cyanosis   The results of significant diagnostics from this hospitalization (including imaging, microbiology, ancillary and laboratory) are listed below for reference.    Microbiology: No results found for this or any previous visit (from the past 240 hour(s)).   Labs: BNP (last 3 results) No results for input(s): BNP in the last 8760 hours. Basic Metabolic Panel: Recent Labs  Lab 11/17/19 1936 11/20/19 0418  NA 134* 139  K 3.8 4.4  CL 99 107  CO2 23 24  GLUCOSE 240* 127*  BUN 26* 42*  CREATININE 1.48* 1.48*  CALCIUM 8.8* 9.5   Liver Function Tests: Recent Labs  Lab 11/20/19 0418  AST 20  ALT 14  ALKPHOS 53  BILITOT 0.6  PROT 6.5  ALBUMIN 3.4*   No results for input(s): LIPASE, AMYLASE in the last 168 hours. No results for input(s): AMMONIA in the last 168 hours. CBC: Recent Labs  Lab 11/17/19 1936 11/19/19 0725 11/20/19 0418  WBC 14.3* 14.4* 14.1*  NEUTROABS 13.4*  --   --   HGB 11.0* 10.9* 11.4*  HCT 32.7* 31.8* 32.2*  MCV 97.6 96.4 95.0  PLT 289 292 321   Cardiac Enzymes: No results for input(s): CKTOTAL, CKMB, CKMBINDEX, TROPONINI in the last 168 hours. BNP: Invalid input(s): POCBNP CBG: Recent Labs  Lab 11/20/19 0826 11/20/19 1807 11/20/19 2032 11/21/19 0843 11/21/19 1140  GLUCAP 105* 181* 200* 118*  118* 208*   D-Dimer No results for input(s): DDIMER in the last 72 hours. Hgb A1c Recent Labs    11/18/19 1612  HGBA1C 6.5*   Lipid Profile No results for input(s): CHOL, HDL, LDLCALC, TRIG, CHOLHDL, LDLDIRECT in the last 72 hours. Thyroid function studies No results for input(s): TSH, T4TOTAL, T3FREE, THYROIDAB in the last 72 hours.  Invalid input(s): FREET3 Anemia work up No results for input(s): VITAMINB12, FOLATE, FERRITIN, TIBC, IRON, RETICCTPCT in the last 72 hours. Urinalysis    Component Value Date/Time   COLORURINE COLORLESS (A) 03/30/2019 1421   APPEARANCEUR CLEAR (A) 03/30/2019 1421    APPEARANCEUR CLEAR 08/15/2012 1031   LABSPEC 1.006 03/30/2019 1421   LABSPEC 1.015 08/15/2012 1031   PHURINE 7.0 03/30/2019 1421   GLUCOSEU NEGATIVE 03/30/2019 1421   GLUCOSEU 300 mg/dL 14/01/2019 01/74/9449   HGBUR NEGATIVE 03/30/2019 1421   BILIRUBINUR NEGATIVE 03/30/2019 1421   BILIRUBINUR NEGATIVE 08/15/2012 1031   KETONESUR NEGATIVE 03/30/2019 1421   PROTEINUR NEGATIVE 03/30/2019 1421   NITRITE NEGATIVE 03/30/2019 1421   LEUKOCYTESUR TRACE (A) 03/30/2019 1421   LEUKOCYTESUR NEGATIVE 08/15/2012 1031   Sepsis Labs Invalid input(s): PROCALCITONIN,  WBC,  LACTICIDVEN Microbiology No results found for this or any previous visit (from the past 240 hour(s)).  Time coordinating discharge: Over 30 minutes  SIGNED:  08/17/2012, MD  [facility] 11/21/2019, 12:21 PM  If 7PM-7AM, please contact night-coverage [email or web address]  This record has been created  using Systems analyst. Errors have been sought and corrected,but may not always be located. Such creation errors do not reflect on the standard of care.

## 2019-11-21 LAB — PROCALCITONIN: Procalcitonin: <0.1 ng/mL

## 2019-11-21 LAB — GLUCOSE, CAPILLARY
Glucose-Capillary: 118 mg/dL — ABNORMAL HIGH (ref 70–99)
Glucose-Capillary: 118 mg/dL — ABNORMAL HIGH (ref 70–99)
Glucose-Capillary: 208 mg/dL — ABNORMAL HIGH (ref 70–99)

## 2019-11-21 LAB — C-REACTIVE PROTEIN: CRP: 0.9 mg/dL (ref <1.0)

## 2019-11-21 MED ORDER — DM-GUAIFENESIN ER 30-600 MG PO TB12: 1.0000 | ORAL_TABLET | Freq: Two times a day (BID) | ORAL | 0 refills | Status: DC

## 2019-11-21 MED ORDER — METOPROLOL TARTRATE 25 MG PO TABS: 25.0000 mg | ORAL_TABLET | Freq: Two times a day (BID) | ORAL | 1 refills | Status: DC

## 2019-11-21 MED ORDER — DEXAMETHASONE 6 MG PO TABS
6.0000 mg | ORAL_TABLET | Freq: Every day | ORAL | 0 refills | Status: AC
Start: 2019-11-21 — End: 2019-11-26

## 2019-11-21 NOTE — Progress Notes (Signed)
[facility] Cardiology [facility] Encounter Note  Patient: [name] / Admit Date: 11/17/2019 / Date of Encounter: 11/21/2019, 9:59 AM   Subjective: Patient overall feeling much better since admission with no evidence of significant episodes of chest discomfort.  Troponin peaked at 6149 consistent with non-ST ovation myocardial infarction.  Patient does still have a cough and congestion but no evidence of significant problems with this consistent with worsening pneumonia.  The patient has had abnormal EKG but telemetry shows normal sinus rhythm with bundle branch block.  She has been placed on more than 48 hours of heparin and improved from cardiovascular standpoint and started on isosorbide metoprolol Plavix and aspirin with amlodipine and high intensity cholesterol therapy for coronary artery disease and tolerated well No sx with minimal ambulation Echo with normal lv funciton and EF 60% Review of Systems: Positive for: Cough congestion Negative for: Vision change, hearing change, syncope, dizziness, nausea, vomiting,diarrhea, bloody stool, stomach pain, positive for cough, congestion, negative for diaphoresis, urinary frequency, urinary pain,skin lesions, skin rashes Others previously listed  Objective: Telemetry: Normal sinus rhythm with bundle branch block Physical Exam: As per prime doc  Intake/Output Summary (Last 24 hours) at 11/21/2019 0959 Last data filed at 11/20/2019 2100 Gross per 24 hour  Intake 200 ml  Output 600 ml  Net -400 ml    Inpatient Medications:  . amLODipine  5 mg Oral Daily  . aspirin EC  81 mg Oral Daily  . atorvastatin  40 mg Oral Daily  . cloNIDine  0.1 mg Transdermal Weekly  . clopidogrel  75 mg Oral Daily  . colchicine  0.3 mg Oral Daily  . dexamethasone (DECADRON) injection  6 mg Intravenous Q24H  . dextromethorphan-guaiFENesin  1 tablet Oral BID  . donepezil  5 mg Oral Daily  . doxycycline  100 mg Oral BID  . enoxaparin (LOVENOX) injection  30 mg  Subcutaneous Q24H  . fluticasone  2 spray Each Nare Daily  . gabapentin  100 mg Oral TID  . glimepiride  1 mg Oral Q breakfast  . insulin aspart  0-15 Units Subcutaneous TID WC  . insulin aspart  4 Units Subcutaneous TID WC  . isosorbide mononitrate  60 mg Oral Daily  . magnesium oxide  400 mg Oral Daily  . metoprolol tartrate  25 mg Oral BID  . multivitamin with minerals  1 tablet Oral Daily  . pantoprazole  40 mg Oral Daily  . timolol  1 drop Both Eyes QPM   Infusions:   Labs: Recent Labs    11/20/19 0418  NA 139  K 4.4  CL 107  CO2 24  GLUCOSE 127*  BUN 42*  CREATININE 1.48*  CALCIUM 9.5   Recent Labs    11/20/19 0418  AST 20  ALT 14  ALKPHOS 53  BILITOT 0.6  PROT 6.5  ALBUMIN 3.4*   Recent Labs    11/19/19 0725 11/20/19 0418  WBC 14.4* 14.1*  HGB 10.9* 11.4*  HCT 31.8* 32.2*  MCV 96.4 95.0  PLT 292 321   No results for input(s): CKTOTAL, CKMB, TROPONINI in the last 72 hours. Invalid input(s): POCBNP Recent Labs    11/18/19 1612  HGBA1C 6.5*     Weights: Filed Weights   11/17/19 1932 11/19/19 2057 11/20/19 0300  Weight: 64.9 kg 62 kg 62 kg     Radiology/Studies:  CT Angio Chest PE W and/or Wo Contrast  Result Date: 11/17/2019 CLINICAL DATA:  Positive COVID, cough EXAM: CT ANGIOGRAPHY CHEST WITH  CONTRAST TECHNIQUE: Multidetector CT imaging of the chest was performed using the standard protocol during bolus administration of intravenous contrast. Multiplanar CT image reconstructions and MIPs were obtained to evaluate the vascular anatomy. CONTRAST:  69mL OMNIPAQUE IOHEXOL 350 MG/ML SOLN COMPARISON:  March 30, 2019 FINDINGS: Cardiovascular: There is a optimal opacification of the pulmonary arteries. There is no central,segmental, or subsegmental filling defects within the pulmonary arteries. There is mild cardiomegaly. Coronary artery calcifications are seen. No pericardial effusion or thickening. No evidence right heart strain. There is a patent  four-vessel arch with a common origin of the left vertebral artery off the arch. Scattered aortic atherosclerosis is noted. Mediastinum/Nodes: No hilar, mediastinal, or axillary adenopathy. Thyroid gland, trachea, and esophagus demonstrate no significant findings. Lungs/Pleura: There is a small focal patchy opacity seen at the left lung base. The right lung is clear. No pleural effusion is seen. Upper Abdomen: No acute abnormalities present in the visualized portions of the upper abdomen. Musculoskeletal: No chest wall abnormality. No acute or significant osseous findings. Review of the MIP images confirms the above findings. IMPRESSION: No central, segmental, or subsegmental pulmonary embolism. Small focal patchy area at the left lung base which could be due to atelectasis and/or early infectious etiology. Aortic Atherosclerosis (ICD10-I70.0). Electronically Signed   By: Jonna Clark M.D.   On: 11/17/2019 21:40   DG Chest Portable 1 View  Result Date: 11/17/2019 CLINICAL DATA:  Cough, chest pain, COVID positive. EXAM: PORTABLE CHEST 1 VIEW COMPARISON:  03/15/2019 FINDINGS: The cardiomediastinal contours are normal. Aortic atherosclerosis. Minimal subsegmental opacities in the lung bases. Pulmonary vasculature is normal. No consolidation, pleural effusion, or pneumothorax. No acute osseous abnormalities are seen. IMPRESSION: Minimal subsegmental bibasilar opacities, may be atelectasis or atypical viral pneumonia in the setting of COVID. Aortic Atherosclerosis (ICD10-I70.0). Electronically Signed   By: Narda Rutherford M.D.   On: 11/17/2019 20:28   ECHOCARDIOGRAM COMPLETE  Result Date: 11/20/2019    ECHOCARDIOGRAM REPORT   Patient Name:   [name] Date of Exam: 11/20/2019 Medical Rec #:  [id number]     Height:       62.0 in Accession #:    [id number]    Weight:       136.7 lb Date of Birth:  June 14, 1930     BSA:          1.626 m Patient Age:    86 years      BP:           176/59 mmHg Patient Gender: F              HR:           62 bpm. Exam Location:  [facility] Procedure: Cardiac Doppler and Color Doppler Indications:     Abnormal ECG 794.31  History:         Patient has no prior history of Echocardiogram examinations.                  Previous Myocardial Infarction; Risk Factors:Hypertension. S/P                  angioplast with stent.  Sonographer:     Cristela Blue RDCS (AE) Referring Phys:  [id number] Gardiner Ramus MATHEWS Diagnosing Phys: Harold Hedge MD  Sonographer Comments: No apical window. IMPRESSIONS  1. Left ventricular ejection fraction, by estimation, is 60 to 65%. Left ventricular ejection fraction by PLAX is 69 %. The left ventricle has normal function. The left ventricle has no regional  wall motion abnormalities. There is mild left ventricular hypertrophy. Left ventricular diastolic parameters are consistent with Grade I diastolic dysfunction (impaired relaxation).  2. Right ventricular systolic function is normal. The right ventricular size is mildly enlarged.  3. The mitral valve was not well visualized. Trivial mitral valve regurgitation.  4. The aortic valve is grossly normal. Aortic valve regurgitation is not visualized. FINDINGS  Left Ventricle: Left ventricular ejection fraction, by estimation, is 60 to 65%. Left ventricular ejection fraction by PLAX is 69 %. The left ventricle has normal function. The left ventricle has no regional wall motion abnormalities. The left ventricular internal cavity size was normal in size. There is mild left ventricular hypertrophy. Left ventricular diastolic parameters are consistent with Grade I diastolic dysfunction (impaired relaxation). Right Ventricle: The right ventricular size is mildly enlarged. No increase in right ventricular wall thickness. Right ventricular systolic function is normal. Left Atrium: Left atrial size was normal in size. Right Atrium: Right atrial size was normal in size. Pericardium: There is no evidence of pericardial effusion. Mitral Valve: The mitral  valve was not well visualized. Trivial mitral valve regurgitation. Tricuspid Valve: The tricuspid valve is not well visualized. Tricuspid valve regurgitation is mild. Aortic Valve: The aortic valve is grossly normal. Aortic valve regurgitation is not visualized. Pulmonic Valve: The pulmonic valve was not well visualized. Pulmonic valve regurgitation is mild. Aorta: The aortic root was not well visualized. IAS/Shunts: The interatrial septum was not assessed.  LEFT VENTRICLE PLAX 2D LV EF:         Left ventricular ejection fraction by PLAX is 69 %. LVIDd:         3.35 cm LVIDs:         2.09 cm LV PW:         1.43 cm LV IVS:        1.27 cm LVOT diam:     2.00 cm LVOT Area:     3.14 cm  LEFT ATRIUM         Index LA diam:    3.00 cm 1.84 cm/m                        PULMONIC VALVE AORTA                 PV Vmax:        0.72 m/s Ao Root diam: 2.80 cm PV Peak grad:   2.1 mmHg                       RVOT Peak grad: 4 mmHg   SHUNTS Systemic Diam: 2.00 cm Harold Hedge MD Electronically signed by Harold Hedge MD Signature Date/Time: 11/20/2019/1:02:21 PM    Final      Assessment and Recommendation  86 y.o. female with known coronary disease status post previous stent placement hypertension hyperlipidemia diabetes chronic kidney disease with acute non-ST elevation myocardial infarction in the setting of Covid viral infection improved clinically on appropriate medication management without evidence of congestive heart failure 1.  Continue supportive care of viral infection 2.  No further cardiac diagnostics needed at this time 3.   continuation of dual antiplatelet therapy for non-ST elevation myocardial infarction 4.  Continue medication management for myocardial infarction hypertension control including isosorbide of beta-blocker amlodipine.  Would use ACE inhibitor if able 5.  Begin ambulation and follow for improvements of symptoms and possible discharge home if patient is improved todday.   Signed, Smitty Cords  Gwen Pounds  M.D. FACC

## 2019-11-21 NOTE — Progress Notes (Signed)
[name] to be D/C'd Home per MD order. Patient given discharge teaching and paperwork regarding medications, diet, follow-up appointments and activity. Patient understanding verbalized. No questions or complaints at this time. Skin condition as charted. IV and telemetry removed prior to leaving.  No further needs by Care Management/Social Work. Prescriptions e-prescribed by MD.  An After Visit Summary was printed and given to the patient.   Patient escorted via wheelchair to ride home with son.  [name]

## 2019-11-27 ENCOUNTER — Encounter: Payer: Self-pay | Admitting: Emergency Medicine

## 2019-11-27 ENCOUNTER — Emergency Department: Payer: Medicare Other

## 2019-11-27 ENCOUNTER — Other Ambulatory Visit: Payer: Self-pay

## 2019-11-27 DIAGNOSIS — N183 Chronic kidney disease, stage 3 unspecified: Secondary | ICD-10-CM | POA: Diagnosis not present

## 2019-11-27 DIAGNOSIS — I2510 Atherosclerotic heart disease of native coronary artery without angina pectoris: Secondary | ICD-10-CM | POA: Insufficient documentation

## 2019-11-27 DIAGNOSIS — Z7901 Long term (current) use of anticoagulants: Secondary | ICD-10-CM | POA: Diagnosis not present

## 2019-11-27 DIAGNOSIS — I129 Hypertensive chronic kidney disease with stage 1 through stage 4 chronic kidney disease, or unspecified chronic kidney disease: Secondary | ICD-10-CM | POA: Insufficient documentation

## 2019-11-27 DIAGNOSIS — Z79899 Other long term (current) drug therapy: Secondary | ICD-10-CM | POA: Diagnosis not present

## 2019-11-27 DIAGNOSIS — R55 Syncope and collapse: Principal | ICD-10-CM | POA: Insufficient documentation

## 2019-11-27 DIAGNOSIS — E119 Type 2 diabetes mellitus without complications: Secondary | ICD-10-CM | POA: Insufficient documentation

## 2019-11-27 DIAGNOSIS — Z7982 Long term (current) use of aspirin: Secondary | ICD-10-CM | POA: Diagnosis not present

## 2019-11-27 DIAGNOSIS — Z7984 Long term (current) use of oral hypoglycemic drugs: Secondary | ICD-10-CM | POA: Diagnosis not present

## 2019-11-27 LAB — CBC WITH DIFFERENTIAL/PLATELET
Abs Immature Granulocytes: 0.66 10*3/uL — ABNORMAL HIGH (ref 0.00–0.07)
Basophils Absolute: 0 10*3/uL (ref 0.0–0.1)
Basophils Relative: 0 %
Eosinophils Absolute: 0.1 10*3/uL (ref 0.0–0.5)
Eosinophils Relative: 1 %
HCT: 37 % (ref 36.0–46.0)
Hemoglobin: 12.5 g/dL (ref 12.0–15.0)
Immature Granulocytes: 3 %
Lymphocytes Relative: 11 %
Lymphs Abs: 2.3 10*3/uL (ref 0.7–4.0)
MCH: 33.1 pg (ref 26.0–34.0)
MCHC: 33.8 g/dL (ref 30.0–36.0)
MCV: 97.9 fL (ref 80.0–100.0)
Monocytes Absolute: 1.5 10*3/uL — ABNORMAL HIGH (ref 0.1–1.0)
Monocytes Relative: 8 %
Neutro Abs: 15.8 10*3/uL — ABNORMAL HIGH (ref 1.7–7.7)
Neutrophils Relative %: 77 %
Platelets: 362 10*3/uL (ref 150–400)
RBC: 3.78 MIL/uL — ABNORMAL LOW (ref 3.87–5.11)
RDW: 14.1 % (ref 11.5–15.5)
WBC: 20.4 10*3/uL — ABNORMAL HIGH (ref 4.0–10.5)
nRBC: 0 % (ref 0.0–0.2)

## 2019-11-27 LAB — URINALYSIS, COMPLETE (UACMP) WITH MICROSCOPIC
Bacteria, UA: NONE SEEN
Bilirubin Urine: NEGATIVE
Glucose, UA: NEGATIVE mg/dL
Hgb urine dipstick: NEGATIVE
Ketones, ur: NEGATIVE mg/dL
Leukocytes,Ua: NEGATIVE
Nitrite: NEGATIVE
Protein, ur: NEGATIVE mg/dL
Specific Gravity, Urine: 1.005 (ref 1.005–1.030)
Squamous Epithelial / HPF: NONE SEEN (ref 0–5)
pH: 5 (ref 5.0–8.0)

## 2019-11-27 LAB — BASIC METABOLIC PANEL
Anion gap: 11 (ref 5–15)
BUN: 39 mg/dL — ABNORMAL HIGH (ref 8–23)
CO2: 29 mmol/L (ref 22–32)
Calcium: 9.1 mg/dL (ref 8.9–10.3)
Chloride: 98 mmol/L (ref 98–111)
Creatinine, Ser: 1.46 mg/dL — ABNORMAL HIGH (ref 0.44–1.00)
GFR calc Af Amer: 37 mL/min — ABNORMAL LOW (ref >60)
GFR calc non Af Amer: 32 mL/min — ABNORMAL LOW (ref >60)
Glucose, Bld: 161 mg/dL — ABNORMAL HIGH (ref 70–99)
Potassium: 3.6 mmol/L (ref 3.5–5.1)
Sodium: 138 mmol/L (ref 135–145)

## 2019-11-27 LAB — TROPONIN I (HIGH SENSITIVITY): Troponin I (High Sensitivity): 34 ng/L — ABNORMAL HIGH (ref <18)

## 2019-11-27 NOTE — ED Provider Notes (Signed)
MSE was initiated and I personally evaluated the patient and placed orders (if any) at  7:11 PM on November 27, 2019.  The patient appears stable so that the remainder of the MSE may be completed by another provider.   Chinita Pester, FNP 11/27/19 2111    Minna Antis, MD 11/27/19 2311

## 2019-11-27 NOTE — ED Triage Notes (Signed)
Pt presents to ED from home via EMS after a syncopal episode. Pt was found by son after he heard her fall and she was lying with her neck on a wooden chair. Pt states at first her neck was hurting a little bit but denies pain currently. Pt discharged from this hospital on Tuesday after she was admitted with COVID. Pt denies sob or chest pain. Alert and answering questions without difficulty.

## 2019-11-28 ENCOUNTER — Observation Stay
Admission: EM | Admit: 2019-11-28 | Discharge: 2019-11-29 | Disposition: A | Discharge status: Home / Self Care | Payer: Medicare Other | Attending: Internal Medicine | Admitting: Internal Medicine

## 2019-11-28 DIAGNOSIS — I2510 Atherosclerotic heart disease of native coronary artery without angina pectoris: Secondary | ICD-10-CM | POA: Diagnosis present

## 2019-11-28 DIAGNOSIS — R55 Syncope and collapse: Secondary | ICD-10-CM | POA: Diagnosis not present

## 2019-11-28 DIAGNOSIS — E119 Type 2 diabetes mellitus without complications: Secondary | ICD-10-CM

## 2019-11-28 DIAGNOSIS — N183 Chronic kidney disease, stage 3 unspecified: Secondary | ICD-10-CM | POA: Diagnosis present

## 2019-11-28 DIAGNOSIS — I10 Essential (primary) hypertension: Secondary | ICD-10-CM | POA: Diagnosis present

## 2019-11-28 DIAGNOSIS — R001 Bradycardia, unspecified: Secondary | ICD-10-CM

## 2019-11-28 LAB — GLUCOSE, CAPILLARY
Glucose-Capillary: 64 mg/dL — ABNORMAL LOW (ref 70–99)
Glucose-Capillary: 129 mg/dL — ABNORMAL HIGH (ref 70–99)
Glucose-Capillary: 83 mg/dL (ref 70–99)
Glucose-Capillary: 154 mg/dL — ABNORMAL HIGH (ref 70–99)

## 2019-11-28 LAB — TROPONIN I (HIGH SENSITIVITY): Troponin I (High Sensitivity): 31 ng/L — ABNORMAL HIGH (ref <18)

## 2019-11-28 MED ORDER — CLOPIDOGREL BISULFATE 75 MG PO TABS
75.0000 mg | ORAL_TABLET | Freq: Every day | ORAL | Status: DC
  Administered 2019-11-28 – 2019-11-29 (×2): 75 mg via ORAL
  Filled 2019-11-28 (×2): qty 1

## 2019-11-28 MED ORDER — SODIUM CHLORIDE 0.9% FLUSH
3.0000 mL | Freq: Two times a day (BID) | INTRAVENOUS | Status: DC
  Administered 2019-11-28 – 2019-11-29 (×2): 3 mL via INTRAVENOUS

## 2019-11-28 MED ORDER — TIMOLOL MALEATE 0.5 % OP SOLN
1.0000 [drp] | Freq: Every evening | OPHTHALMIC | Status: DC
  Filled 2019-11-28 (×2): qty 5

## 2019-11-28 MED ORDER — ONDANSETRON HCL 4 MG PO TABS: 4.0000 mg | ORAL_TABLET | Freq: Four times a day (QID) | ORAL | Status: DC | PRN

## 2019-11-28 MED ORDER — ISOSORBIDE MONONITRATE ER 60 MG PO TB24
60.0000 mg | ORAL_TABLET | Freq: Every day | ORAL | Status: DC
  Administered 2019-11-28 – 2019-11-29 (×2): 60 mg via ORAL
  Filled 2019-11-28 (×2): qty 1

## 2019-11-28 MED ORDER — CLONIDINE HCL 0.1 MG/24HR TD PTWK
0.1000 mg | MEDICATED_PATCH | TRANSDERMAL | Status: DC
  Filled 2019-11-28: qty 1

## 2019-11-28 MED ORDER — AMLODIPINE BESYLATE 5 MG PO TABS
5.0000 mg | ORAL_TABLET | Freq: Every day | ORAL | Status: DC
  Administered 2019-11-28 – 2019-11-29 (×2): 5 mg via ORAL
  Filled 2019-11-28 (×2): qty 1

## 2019-11-28 MED ORDER — SODIUM CHLORIDE 0.9 % IV SOLN: INTRAVENOUS | Status: DC

## 2019-11-28 MED ORDER — ONDANSETRON HCL 4 MG/2ML IJ SOLN: 4.0000 mg | Freq: Four times a day (QID) | INTRAMUSCULAR | Status: DC | PRN

## 2019-11-28 MED ORDER — ASPIRIN EC 81 MG PO TBEC
81.0000 mg | DELAYED_RELEASE_TABLET | Freq: Every day | ORAL | Status: DC
  Administered 2019-11-28 – 2019-11-29 (×2): 81 mg via ORAL
  Filled 2019-11-28 (×2): qty 1

## 2019-11-28 MED ORDER — ENOXAPARIN SODIUM 30 MG/0.3ML ~~LOC~~ SOLN
30.0000 mg | SUBCUTANEOUS | Status: DC
  Administered 2019-11-28 – 2019-11-29 (×2): 30 mg via SUBCUTANEOUS
  Filled 2019-11-28 (×4): qty 0.3

## 2019-11-28 MED ORDER — GABAPENTIN 100 MG PO CAPS
100.0000 mg | ORAL_CAPSULE | Freq: Three times a day (TID) | ORAL | Status: DC
  Administered 2019-11-28 – 2019-11-29 (×4): 100 mg via ORAL
  Filled 2019-11-28 (×5): qty 1

## 2019-11-28 MED ORDER — INSULIN ASPART 100 UNIT/ML ~~LOC~~ SOLN: 0.0000 [IU] | Freq: Every day | SUBCUTANEOUS | Status: DC

## 2019-11-28 MED ORDER — ACETAMINOPHEN 650 MG RE SUPP: 650.0000 mg | Freq: Four times a day (QID) | RECTAL | Status: DC | PRN

## 2019-11-28 MED ORDER — ACETAMINOPHEN 325 MG PO TABS
650.0000 mg | ORAL_TABLET | Freq: Four times a day (QID) | ORAL | Status: DC | PRN
  Administered 2019-11-28: 650 mg via ORAL
  Filled 2019-11-28: qty 2

## 2019-11-28 MED ORDER — INSULIN ASPART 100 UNIT/ML ~~LOC~~ SOLN
0.0000 [IU] | Freq: Three times a day (TID) | SUBCUTANEOUS | Status: DC
  Administered 2019-11-28 – 2019-11-29 (×2): 2 [IU] via SUBCUTANEOUS
  Filled 2019-11-28 (×2): qty 1

## 2019-11-28 MED ORDER — ROSUVASTATIN CALCIUM 10 MG PO TABS
20.0000 mg | ORAL_TABLET | Freq: Every day | ORAL | Status: DC
  Administered 2019-11-28 – 2019-11-29 (×2): 20 mg via ORAL
  Filled 2019-11-28: qty 1
  Filled 2019-11-28: qty 2
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
## 2019-11-28 NOTE — Consult Note (Signed)
[facility] Cardiology Consultation Note  Patient ID: AZADEH KALICKI, MRN: [id number], DOB/AGE: [birth year] 86 y.o. Admit date: 11/28/2019   Date of Consult: 11/28/2019 Primary Physician: Danella Penton, MD Primary Cardiologist: Paraschos  Chief Complaint:  Chief Complaint  Patient presents with  . Loss of Consciousness   Reason for Consult: Syncope  HPI: 86 y.o. female with known coronary artery disease status post previous PCI and stent placement hypertension hyperlipidemia diabetes chronic kidney disease stage III with recent issue of Covid pneumonia several weeks prior with treatment but also having a non-ST elevation myocardial infarction.  It is unclear whether this was an acute coronary syndrome and or demand ischemia.  She did have chest pain but this was completely relieved with appropriate medication management including beta-blocker heparin and dual antiplatelet therapy.  The patient was then discharged on appropriate therapy for which the patient had done well but then had an episode of syncope where she remembers being in the kitchen and then lost consciousness and was then found sometime later leading up to a wooden chair for which she had some neck injury but no evidence of apparent primary heart attack or congestive heart failure when seen in the emergency room.  At that time she had an EKG showing normal sinus rhythm with heart rate of 52 bpm otherwise normal EKG additionally she has a white blood cell count still at 20,000 troponin of 31 consistent with demand ischemia rather than acute coronary syndrome.  Currently this is all improved and telemetry and EKG have not shown any significant advanced heart block and/or further symptomatic bradycardia.  It is possible the patient had some dehydration with or without side effects of medications including clonidine and beta-blocker causing this particular issue.  So far there is been no other significant causes at this time  Past Medical  History:  Diagnosis Date  . Hypercholesteremia   . Hypertension   . Myocardial infarction (HCC) 2008  . S/P angioplasty with stent       Surgical History:  Past Surgical History:  Procedure Laterality Date  . ABDOMINAL HYSTERECTOMY    . APPENDECTOMY    . CORONARY ANGIOPLASTY WITH STENT PLACEMENT       Home Meds: Prior to Admission medications   Medication Sig Start Date End Date Taking? Authorizing Provider  acetaminophen (TYLENOL) 500 MG tablet Take 1,000 mg by mouth every 6 (six) hours as needed. 1-2 tabs daily as needed For pain   Yes [provider]  amLODipine (NORVASC) 5 MG tablet Take 5 mg by mouth daily. 11/14/19  Yes [provider]  aspirin EC 81 MG tablet Take 81 mg by mouth daily.   Yes [provider]  cloNIDine (CATAPRES - DOSED IN MG/24 HR) 0.1 mg/24hr patch 0.1 mg once a week. 11/14/19  Yes [provider]  clopidogrel (PLAVIX) 75 MG tablet Take 75 mg by mouth daily.   Yes [provider]  colchicine 0.6 MG tablet Take 0.6 mg by mouth daily. 11/16/19  Yes [provider]  donepezil (ARICEPT) 5 MG tablet Take 5 mg by mouth daily. 11/14/19  Yes [provider]  DULCOLAX 5 MG EC tablet Take 5 mg by mouth as directed. 08/30/19  Yes [provider]  fluticasone (FLONASE) 50 MCG/ACT nasal spray Place 2 sprays into both nostrils daily. 11/16/19  Yes [provider]  gabapentin (NEURONTIN) 100 MG capsule Take 100 mg by mouth 3 (three) times daily.  02/14/15  Yes [provider]  glimepiride (AMARYL) 1 MG tablet Take 1 mg by mouth daily with breakfast.  08/29/18 11/28/19 Yes [provider]  isosorbide mononitrate (IMDUR) 60 MG 24 hr tablet Take 60 mg by mouth daily.   Yes [provider]  magnesium oxide (MAG-OX) 400 MG tablet Take 400 mg by mouth daily.    Yes [provider]  metoprolol tartrate (LOPRESSOR) 25 MG tablet Take 1 tablet (25 mg total) by mouth 2 (two)  times daily. 11/21/19  Yes Arnetha Courser, MD  nitroGLYCERIN (NITROSTAT) 0.4 MG SL tablet Place 0.4 mg under the tongue every 5 (five) minutes as needed. For chest pain   Yes [provider]  omeprazole (PRILOSEC) 20 MG capsule Take 20 mg by mouth daily.  11/22/18  Yes [provider]  rosuvastatin (CRESTOR) 20 MG tablet Take 20 mg by mouth daily.   Yes [provider]  timolol (BETIMOL) 0.5 % ophthalmic solution Place 1 drop into both eyes every evening.   Yes [provider]  tizanidine (ZANAFLEX) 2 MG capsule Take 2 mg by mouth 3 (three) times daily as needed for muscle spasms.   Yes [provider]  torsemide (DEMADEX) 20 MG tablet Take 30 mg by mouth every evening.    Yes [provider]  Cyanocobalamin (B-12) 50 MCG TABS Take 50 mcg by mouth daily. 08/30/19   [provider]    Inpatient Medications:  . amLODipine  5 mg Oral Daily  . aspirin EC  81 mg Oral Daily  . cloNIDine  0.1 mg Transdermal Weekly  . clopidogrel  75 mg Oral Daily  . enoxaparin (LOVENOX) injection  30 mg Subcutaneous Q24H  . gabapentin  100 mg Oral TID  . insulin aspart  0-15 Units Subcutaneous TID WC  . insulin aspart  0-5 Units Subcutaneous QHS  . isosorbide mononitrate  60 mg Oral Daily  . rosuvastatin  20 mg Oral Daily  . sodium chloride flush  3 mL Intravenous Q12H  . timolol  1 drop Both Eyes QPM   . sodium chloride 75 mL/hr at 11/28/19 0543    Allergies:  Allergies  Allergen Reactions  . Butenafine Swelling    Tongue swelled and turned red  . Levofloxacin     Other reaction(s): Other (See Comments) Tongue swelled and turned red  . Limonene Other (See Comments)    Other reaction(s): Other (See Comments) Tongue swelled and turned red Tongue swelled and turned red   . Terbinafine And Related Other (See Comments)    Tongue swelled and turned red    Social History   Socioeconomic History  . Marital status: Married    Spouse name: Not  on file  . Number of children: Not on file  . Years of education: Not on file  . Highest education level: Not on file  Occupational History  . Not on file  Tobacco Use  . Smoking status: Never Smoker  . Smokeless tobacco: Never Used  Vaping Use  . Vaping Use: Never used  Substance and Sexual Activity  . Alcohol use: No  . Drug use: No  . Sexual activity: Not on file  Other Topics Concern  . Not on file  Social History Narrative  . Not on file   Social Determinants of Health   Financial Resource Strain:   . Difficulty of Paying Living Expenses:   Food Insecurity:   . Worried About Programme researcher, broadcasting/film/video in the Last Year:   . Barista in  the Last Year:   Transportation Needs:   . Freight forwarder (Medical):   Marland Kitchen Lack of Transportation (Non-Medical):   Physical Activity:   . Days of Exercise per Week:   . Minutes of Exercise per Session:   Stress:   . Feeling of Stress :   Social Connections:   . Frequency of Communication with Friends and Family:   . Frequency of Social Gatherings with Friends and Family:   . Attends Religious Services:   . Active Member of Clubs or Organizations:   . Attends Banker Meetings:   Marland Kitchen Marital Status:   Intimate Partner Violence:   . Fear of Current or Ex-Partner:   . Emotionally Abused:   Marland Kitchen Physically Abused:   . Sexually Abused:      Family History  Problem Relation Age of Onset  . Breast cancer Maternal Aunt      Review of Systems Positive for syncope Negative for: General:  chills, fever, night sweats or weight changes.  Cardiovascular: PND orthopnea positive for syncope dizziness  Dermatological skin lesions rashes Respiratory: Cough congestion Urologic: Frequent urination urination at night and hematuria Abdominal: negative for nausea, vomiting, diarrhea, bright red blood per rectum, melena, or hematemesis Neurologic: negative for visual changes, and/or hearing changes  All other systems reviewed  and are otherwise negative except as noted above.  Labs: No results for input(s): CKTOTAL, CKMB, TROPONINI in the last 72 hours. Lab Results  Component Value Date   WBC 20.4 (H) 11/27/2019   HGB 12.5 11/27/2019   HCT 37.0 11/27/2019   MCV 97.9 11/27/2019   PLT 362 11/27/2019    Recent Labs  Lab 11/27/19 1921  NA 138  K 3.6  CL 98  CO2 29  BUN 39*  CREATININE 1.46*  CALCIUM 9.1  GLUCOSE 161*   Lab Results  Component Value Date   CHOL 170 11/18/2019   HDL 52 11/18/2019   LDLCALC 100 (H) 11/18/2019   TRIG 92 11/18/2019   No results found for: DDIMER  Radiology/Studies:  CT Head Wo Contrast  Result Date: 11/27/2019 CLINICAL DATA:  Neck pain following a fall. EXAM: CT HEAD WITHOUT CONTRAST CT CERVICAL SPINE WITHOUT CONTRAST TECHNIQUE: Multidetector CT imaging of the head and cervical spine was performed following the standard protocol without intravenous contrast. Multiplanar CT image reconstructions of the cervical spine were also generated. COMPARISON:  03/30/2019 FINDINGS: CT HEAD FINDINGS Brain: Stable moderately enlarged ventricles and subarachnoid spaces. Mild-to-moderate patchy white matter low density in both cerebral hemispheres without significant change. No intracranial hemorrhage, mass lesion or CT evidence of acute infarction. Vascular: No hyperdense vessel or unexpected calcification. Skull: Normal. Negative for fracture or focal lesion. Sinuses/Orbits: Unremarkable. Other: None. CT CERVICAL SPINE FINDINGS Alignment: Stable reversal of the normal cervical lordosis. Stable anterolisthesis at the C2-3 and C4-5 levels. No acute subluxations. Skull base and vertebrae: No acute fracture. No primary bone lesion or focal pathologic process. Soft tissues and spinal canal: No prevertebral fluid or swelling. No visible canal hematoma. Disc levels: Stable multilevel degenerative changes. These include facet degenerative changes. Upper chest: Clear lung apices. Other: Dense bilateral  carotid artery calcifications. IMPRESSION: 1. No skull fracture or intracranial hemorrhage. 2. No cervical spine fracture or traumatic subluxation. 3. Stable moderate diffuse cerebral and cerebellar atrophy and mild-to-moderate chronic small vessel white matter ischemic changes in both cerebral hemispheres. 4. Stable reversal of the normal cervical lordosis and multilevel cervical spine degenerative changes. 5. Dense bilateral carotid artery atheromatous calcifications. Electronically Signed  By: Beckie Salts M.D.   On: 11/27/2019 19:48   CT Angio Chest PE W and/or Wo Contrast  Result Date: 11/17/2019 CLINICAL DATA:  Positive COVID, cough EXAM: CT ANGIOGRAPHY CHEST WITH CONTRAST TECHNIQUE: Multidetector CT imaging of the chest was performed using the standard protocol during bolus administration of intravenous contrast. Multiplanar CT image reconstructions and MIPs were obtained to evaluate the vascular anatomy. CONTRAST:  60mL OMNIPAQUE IOHEXOL 350 MG/ML SOLN COMPARISON:  March 30, 2019 FINDINGS: Cardiovascular: There is a optimal opacification of the pulmonary arteries. There is no central,segmental, or subsegmental filling defects within the pulmonary arteries. There is mild cardiomegaly. Coronary artery calcifications are seen. No pericardial effusion or thickening. No evidence right heart strain. There is a patent four-vessel arch with a common origin of the left vertebral artery off the arch. Scattered aortic atherosclerosis is noted. Mediastinum/Nodes: No hilar, mediastinal, or axillary adenopathy. Thyroid gland, trachea, and esophagus demonstrate no significant findings. Lungs/Pleura: There is a small focal patchy opacity seen at the left lung base. The right lung is clear. No pleural effusion is seen. Upper Abdomen: No acute abnormalities present in the visualized portions of the upper abdomen. Musculoskeletal: No chest wall abnormality. No acute or significant osseous findings. Review of the MIP  images confirms the above findings. IMPRESSION: No central, segmental, or subsegmental pulmonary embolism. Small focal patchy area at the left lung base which could be due to atelectasis and/or early infectious etiology. Aortic Atherosclerosis (ICD10-I70.0). Electronically Signed   By: Jonna Clark M.D.   On: 11/17/2019 21:40   CT Cervical Spine Wo Contrast  Result Date: 11/27/2019 CLINICAL DATA:  Neck pain following a fall. EXAM: CT HEAD WITHOUT CONTRAST CT CERVICAL SPINE WITHOUT CONTRAST TECHNIQUE: Multidetector CT imaging of the head and cervical spine was performed following the standard protocol without intravenous contrast. Multiplanar CT image reconstructions of the cervical spine were also generated. COMPARISON:  03/30/2019 FINDINGS: CT HEAD FINDINGS Brain: Stable moderately enlarged ventricles and subarachnoid spaces. Mild-to-moderate patchy white matter low density in both cerebral hemispheres without significant change. No intracranial hemorrhage, mass lesion or CT evidence of acute infarction. Vascular: No hyperdense vessel or unexpected calcification. Skull: Normal. Negative for fracture or focal lesion. Sinuses/Orbits: Unremarkable. Other: None. CT CERVICAL SPINE FINDINGS Alignment: Stable reversal of the normal cervical lordosis. Stable anterolisthesis at the C2-3 and C4-5 levels. No acute subluxations. Skull base and vertebrae: No acute fracture. No primary bone lesion or focal pathologic process. Soft tissues and spinal canal: No prevertebral fluid or swelling. No visible canal hematoma. Disc levels: Stable multilevel degenerative changes. These include facet degenerative changes. Upper chest: Clear lung apices. Other: Dense bilateral carotid artery calcifications. IMPRESSION: 1. No skull fracture or intracranial hemorrhage. 2. No cervical spine fracture or traumatic subluxation. 3. Stable moderate diffuse cerebral and cerebellar atrophy and mild-to-moderate chronic small vessel white matter  ischemic changes in both cerebral hemispheres. 4. Stable reversal of the normal cervical lordosis and multilevel cervical spine degenerative changes. 5. Dense bilateral carotid artery atheromatous calcifications. Electronically Signed   By: Beckie Salts M.D.   On: 11/27/2019 19:48   DG Chest Portable 1 View  Result Date: 11/17/2019 CLINICAL DATA:  Cough, chest pain, COVID positive. EXAM: PORTABLE CHEST 1 VIEW COMPARISON:  03/15/2019 FINDINGS: The cardiomediastinal contours are normal. Aortic atherosclerosis. Minimal subsegmental opacities in the lung bases. Pulmonary vasculature is normal. No consolidation, pleural effusion, or pneumothorax. No acute osseous abnormalities are seen. IMPRESSION: Minimal subsegmental bibasilar opacities, may be atelectasis or atypical viral pneumonia in  the setting of COVID. Aortic Atherosclerosis (ICD10-I70.0). Electronically Signed   By: Narda Rutherford M.D.   On: 11/17/2019 20:28   ECHOCARDIOGRAM COMPLETE  Result Date: 11/20/2019    ECHOCARDIOGRAM REPORT   Patient Name:   NANAMI WHITELAW Gitlin Date of Exam: 11/20/2019 Medical Rec #:  [id number]     Height:       62.0 in Accession #:    [id number]    Weight:       136.7 lb Date of Birth:  12-10-1930     BSA:          1.626 m Patient Age:    88 years      BP:           176/59 mmHg Patient Gender: F             HR:           62 bpm. Exam Location:  [facility] Procedure: Cardiac Doppler and Color Doppler Indications:     Abnormal ECG 794.31  History:         Patient has no prior history of Echocardiogram examinations.                  Previous Myocardial Infarction; Risk Factors:Hypertension. S/P                  angioplast with stent.  Sonographer:     Cristela Blue RDCS (AE) Referring Phys:  [id number] Gardiner Ramus MATHEWS Diagnosing Phys: Harold Hedge MD  Sonographer Comments: No apical window. IMPRESSIONS  1. Left ventricular ejection fraction, by estimation, is 60 to 65%. Left ventricular ejection fraction by PLAX is 69 %. The left ventricle has  normal function. The left ventricle has no regional wall motion abnormalities. There is mild left ventricular hypertrophy. Left ventricular diastolic parameters are consistent with Grade I diastolic dysfunction (impaired relaxation).  2. Right ventricular systolic function is normal. The right ventricular size is mildly enlarged.  3. The mitral valve was not well visualized. Trivial mitral valve regurgitation.  4. The aortic valve is grossly normal. Aortic valve regurgitation is not visualized. FINDINGS  Left Ventricle: Left ventricular ejection fraction, by estimation, is 60 to 65%. Left ventricular ejection fraction by PLAX is 69 %. The left ventricle has normal function. The left ventricle has no regional wall motion abnormalities. The left ventricular internal cavity size was normal in size. There is mild left ventricular hypertrophy. Left ventricular diastolic parameters are consistent with Grade I diastolic dysfunction (impaired relaxation). Right Ventricle: The right ventricular size is mildly enlarged. No increase in right ventricular wall thickness. Right ventricular systolic function is normal. Left Atrium: Left atrial size was normal in size. Right Atrium: Right atrial size was normal in size. Pericardium: There is no evidence of pericardial effusion. Mitral Valve: The mitral valve was not well visualized. Trivial mitral valve regurgitation. Tricuspid Valve: The tricuspid valve is not well visualized. Tricuspid valve regurgitation is mild. Aortic Valve: The aortic valve is grossly normal. Aortic valve regurgitation is not visualized. Pulmonic Valve: The pulmonic valve was not well visualized. Pulmonic valve regurgitation is mild. Aorta: The aortic root was not well visualized. IAS/Shunts: The interatrial septum was not assessed.  LEFT VENTRICLE PLAX 2D LV EF:         Left ventricular ejection fraction by PLAX is 69 %. LVIDd:         3.35 cm LVIDs:         2.09 cm LV PW:  1.43 cm LV IVS:        1.27  cm LVOT diam:     2.00 cm LVOT Area:     3.14 cm  LEFT ATRIUM         Index LA diam:    3.00 cm 1.84 cm/m                        PULMONIC VALVE AORTA                 PV Vmax:        0.72 m/s Ao Root diam: 2.80 cm PV Peak grad:   2.1 mmHg                       RVOT Peak grad: 4 mmHg   SHUNTS Systemic Diam: 2.00 cm Harold Hedge MD Electronically signed by Harold Hedge MD Signature Date/Time: 11/20/2019/1:02:21 PM    Final     EKG: Normal sinus rhythm with heart rate of 52 bpm  Weights: Filed Weights   11/27/19 1913  Weight: 62 kg     Physical Exam: Blood pressure (!) 137/46, pulse 66, temperature 98.2 F (36.8 C), temperature source Oral, resp. rate 18, height 5\' 2"  (1.575 m), weight 62 kg, SpO2 97 %. Body mass index is 25 kg/m. General: Well developed, well nourished, in no acute distress. Head eyes ears nose throat: Normocephalic, atraumatic, sclera non-icteric, no xanthomas, nares are without discharge. No apparent thyromegaly and/or mass  Lungs: Normal respiratory effort.  no wheezes, no rales, no rhonchi.  Heart: RRR with normal S1 S2. no murmur gallop, no rub, PMI is normal size and placement, carotid upstroke normal without bruit, jugular venous pressure is normal Abdomen: Soft, non-tender, non-distended with normoactive bowel sounds. No hepatomegaly. No rebound/guarding. No obvious abdominal masses. Abdominal aorta is normal size without bruit Extremities: No edema. no cyanosis, no clubbing, no ulcers  Peripheral : 2+ bilateral upper extremity pulses, 2+ bilateral femoral pulses, 2+ bilateral dorsal pedal pulse Neuro: Alert and oriented. No facial asymmetry. No focal deficit. Moves all extremities spontaneously. Musculoskeletal: Normal muscle tone without kyphosis Psych:  Responds to questions appropriately with a normal affect.    Assessment: 86 year old female with recent non-ST elevation myocardial infarction hypertension hyperlipidemia coronary artery disease on appropriate  medications after her myocardial infarction having a syncopal episode multifactorial in nature but no current evidence of advanced heart block by EKG or telemetry at this time and no evidence of congestive heart failure or myocardial infarction at this time most consistent with medication issues and/or hypotension  Plan: 1.  Continue supportive care possible hydration and syncopal episode 2.  No further cardiac diagnostics necessary at this time 3.  Continue telemetry watching for significance of bradycardia which may have been caused by beta-blocker and or combination with clonidine with and/or without hypotension or dehydration and no current evidence of angina heart failure or myocardial infarction 4.  Begin ambulation with current medical regimen watching for chronotropic incompetence hypotension and/or symptomatic bradycardia with need for adjustments 5.  If ambulating well tomorrow morning without any further significant issues after discontinuation of metoprolol okay for discharged home with follow-up at the end of the week for further adjustments with Dr. 88  Signed, Darrold Junker M.D. [facility] 11/28/2019, 1:44 PM

## 2019-11-28 NOTE — H&P (Signed)
History and Physical    [name] BOE:[id number] DOB: [birth year] DOA: 11/28/2019  PCP: Danella Penton, MD   Patient coming from: home  I have personally briefly reviewed patient's old medical records in [facility]  Chief Complaint: Loss of consciousness with fall  HPI: [name] is a 86 y.o. female with medical history significant for HTN, DM, CAD, recently hospitalized from 7/30-8/04 with breakthrough Covid pneumonia, complicated by elevated troponin in the thousands due to NSTEMI versus myocarditis treated with heparin infusion x48 hours completing Covid treatment, discharged on Decadron, who was in her usual state of health and feeling well when she apparently passed out.  Says the last thing she remembers was having lunch and the next thing she remembers is waking up on the floor with her son beside her with paramedics standing over her.  She cannot recall any symptoms preceding the fall.  Her son found her sitting on the floor with her head lying against a wooden chair.  She denies chest pain, shortness of breath.  Did have some mild pain on the right side of her neck which has subsided for the most part. ED Course: On arrival to the ER she was awake and alert and back to her baseline.  Vitals were within normal limits.  Blood work showing troponin of 34/31.  Creatinine 1.46.  WBC 20,000, probably related to recent Decadron.  Urinalysis unremarkable.  CT head and C-spine without acute injury.  Chest x-ray not done.  EKG showed sinus bradycardia with rate of 52.  Hospitalist consulted for admission  Review of Systems: As per HPI otherwise all other systems on review of systems negative.    Past Medical History:  Diagnosis Date  . Hypercholesteremia   . Hypertension   . Myocardial infarction (HCC) 2008  . S/P angioplasty with stent     Past Surgical History:  Procedure Laterality Date  . ABDOMINAL HYSTERECTOMY    . APPENDECTOMY    . CORONARY ANGIOPLASTY WITH STENT  PLACEMENT       reports that she has never smoked. She has never used smokeless tobacco. She reports that she does not drink alcohol and does not use drugs.  Allergies  Allergen Reactions  . Butenafine Swelling    Tongue swelled and turned red  . Levofloxacin     Other reaction(s): Other (See Comments) Tongue swelled and turned red  . Limonene Other (See Comments)    Other reaction(s): Other (See Comments) Tongue swelled and turned red Tongue swelled and turned red   . Terbinafine And Related Other (See Comments)    Tongue swelled and turned red    Family History  Problem Relation Age of Onset  . Breast cancer Maternal Aunt       Prior to Admission medications   Medication Sig Start Date End Date Taking? Authorizing Provider  amLODipine (NORVASC) 5 MG tablet Take 5 mg by mouth daily. 11/14/19  Yes [provider]  aspirin EC 81 MG tablet Take 81 mg by mouth daily.   Yes [provider]  cloNIDine (CATAPRES - DOSED IN MG/24 HR) 0.1 mg/24hr patch 0.1 mg once a week. 11/14/19  Yes [provider]  clopidogrel (PLAVIX) 75 MG tablet Take 75 mg by mouth daily.   Yes [provider]  colchicine 0.6 MG tablet Take 0.6 mg by mouth daily. 11/16/19  Yes [provider]  donepezil (ARICEPT) 5 MG tablet Take 5 mg by mouth daily. 11/14/19  Yes [provider]  fluticasone (FLONASE) 50 MCG/ACT nasal spray Place 2 sprays into both nostrils daily. 11/16/19  Yes [provider]  gabapentin (NEURONTIN) 100 MG capsule Take 100 mg by mouth 3 (three) times daily.  02/14/15  Yes [provider]  glimepiride (AMARYL) 1 MG tablet Take 1 mg by mouth daily with breakfast.  08/29/18 11/28/19 Yes [provider]  isosorbide mononitrate (IMDUR) 60 MG 24 hr tablet Take 60 mg by mouth daily.   Yes [provider]  magnesium oxide (MAG-OX) 400 MG tablet Take 400 mg by mouth daily.    Yes [provider]  metoprolol  tartrate (LOPRESSOR) 25 MG tablet Take 1 tablet (25 mg total) by mouth 2 (two) times daily. 11/21/19  Yes Arnetha Courser, MD  nitroGLYCERIN (NITROSTAT) 0.4 MG SL tablet Place 0.4 mg under the tongue every 5 (five) minutes as needed. For chest pain   Yes [provider]  omeprazole (PRILOSEC) 20 MG capsule Take 20 mg by mouth daily.  11/22/18  Yes [provider]  rosuvastatin (CRESTOR) 20 MG tablet Take 20 mg by mouth daily.   Yes [provider]  timolol (BETIMOL) 0.5 % ophthalmic solution Place 1 drop into both eyes every evening.   Yes [provider]  tizanidine (ZANAFLEX) 2 MG capsule Take 2 mg by mouth 3 (three) times daily as needed for muscle spasms.   Yes [provider]  torsemide (DEMADEX) 20 MG tablet Take 30 mg by mouth every evening.    Yes [provider]  acetaminophen (TYLENOL) 500 MG tablet Take 1,000 mg by mouth every 6 (six) hours as needed. 1-2 tabs daily as needed For pain    [provider]  dextromethorphan-guaiFENesin (MUCINEX DM) 30-600 MG 12hr tablet Take 1 tablet by mouth 2 (two) times daily. 11/21/19   Arnetha Courser, MD    Physical Exam: Vitals:   11/27/19 1909 11/27/19 1913 11/28/19 0004  BP: (!) 166/58  (!) 160/57  Pulse: 61  61  Resp: 18  17  Temp: 98 F (36.7 C)  98.2 F (36.8 C)  TempSrc: Oral  Oral  SpO2: 100%  100%  Weight:  62 kg   Height:  5\' 2"  (1.575 m)      Vitals:   11/27/19 1909 11/27/19 1913 11/28/19 0004  BP: (!) 166/58  (!) 160/57  Pulse: 61  61  Resp: 18  17  Temp: 98 F (36.7 C)  98.2 F (36.8 C)  TempSrc: Oral  Oral  SpO2: 100%  100%  Weight:  62 kg   Height:  5\' 2"  (1.575 m)       Constitutional: Alert and oriented x 3 . Not in any apparent distress HEENT:      Head: Normocephalic and atraumatic.         Eyes: PERLA, EOMI, Conjunctivae are normal. Sclera is non-icteric.       Mouth/Throat: Mucous membranes are moist.       Neck: Supple with no signs of  meningismus. Cardiovascular: Regular rate and rhythm. No murmurs, gallops, or rubs. 2+ symmetrical distal pulses are present . No JVD. No LE edema Respiratory: Respiratory effort normal .Lungs sounds clear bilaterally. No wheezes, crackles, or rhonchi.  Gastrointestinal: Soft, non tender, and non distended with positive bowel sounds. No rebound or guarding. Genitourinary: No CVA tenderness. Musculoskeletal: Nontender with normal range of motion in all extremities. No cyanosis, or erythema of extremities. Neurologic: Normal speech and language. Face is symmetric. Moving all extremities. No gross  focal neurologic deficits . Skin: Skin is warm, dry.  No rash or ulcers Psychiatric: Mood and affect are normal Speech and behavior are normal   Labs on Admission: I have personally reviewed following labs and imaging studies  CBC: Recent Labs  Lab 11/27/19 1921  WBC 20.4*  NEUTROABS 15.8*  HGB 12.5  HCT 37.0  MCV 97.9  PLT 362   Basic Metabolic Panel: Recent Labs  Lab 11/27/19 1921  NA 138  K 3.6  CL 98  CO2 29  GLUCOSE 161*  BUN 39*  CREATININE 1.46*  CALCIUM 9.1   GFR: Estimated Creatinine Clearance: 24 mL/min (A) (by C-G formula based on SCr of 1.46 mg/dL (H)). Liver Function Tests: No results for input(s): AST, ALT, ALKPHOS, BILITOT, PROT, ALBUMIN in the last 168 hours. No results for input(s): LIPASE, AMYLASE in the last 168 hours. No results for input(s): AMMONIA in the last 168 hours. Coagulation Profile: No results for input(s): INR, PROTIME in the last 168 hours. Cardiac Enzymes: No results for input(s): CKTOTAL, CKMB, CKMBINDEX, TROPONINI in the last 168 hours. BNP (last 3 results) No results for input(s): PROBNP in the last 8760 hours. HbA1C: No results for input(s): HGBA1C in the last 72 hours. CBG: Recent Labs  Lab 11/21/19 0843 11/21/19 1140  GLUCAP 118*  118* 208*   Lipid Profile: No results for input(s): CHOL, HDL, LDLCALC, TRIG, CHOLHDL, LDLDIRECT  in the last 72 hours. Thyroid Function Tests: No results for input(s): TSH, T4TOTAL, FREET4, T3FREE, THYROIDAB in the last 72 hours. Anemia Panel: No results for input(s): VITAMINB12, FOLATE, FERRITIN, TIBC, IRON, RETICCTPCT in the last 72 hours. Urine analysis:    Component Value Date/Time   COLORURINE STRAW (A) 11/27/2019 1922   APPEARANCEUR CLEAR (A) 11/27/2019 1922   APPEARANCEUR CLEAR 08/15/2012 1031   LABSPEC 1.005 11/27/2019 1922   LABSPEC 1.015 08/15/2012 1031   PHURINE 5.0 11/27/2019 1922   GLUCOSEU NEGATIVE 11/27/2019 1922   GLUCOSEU 300 mg/dL 99/37/1696 7893   HGBUR NEGATIVE 11/27/2019 1922   BILIRUBINUR NEGATIVE 11/27/2019 1922   BILIRUBINUR NEGATIVE 08/15/2012 1031   KETONESUR NEGATIVE 11/27/2019 1922   PROTEINUR NEGATIVE 11/27/2019 1922   NITRITE NEGATIVE 11/27/2019 1922   LEUKOCYTESUR NEGATIVE 11/27/2019 1922   LEUKOCYTESUR NEGATIVE 08/15/2012 1031    Radiological Exams on Admission: CT Head Wo Contrast  Result Date: 11/27/2019 CLINICAL DATA:  Neck pain following a fall. EXAM: CT HEAD WITHOUT CONTRAST CT CERVICAL SPINE WITHOUT CONTRAST TECHNIQUE: Multidetector CT imaging of the head and cervical spine was performed following the standard protocol without intravenous contrast. Multiplanar CT image reconstructions of the cervical spine were also generated. COMPARISON:  03/30/2019 FINDINGS: CT HEAD FINDINGS Brain: Stable moderately enlarged ventricles and subarachnoid spaces. Mild-to-moderate patchy white matter low density in both cerebral hemispheres without significant change. No intracranial hemorrhage, mass lesion or CT evidence of acute infarction. Vascular: No hyperdense vessel or unexpected calcification. Skull: Normal. Negative for fracture or focal lesion. Sinuses/Orbits: Unremarkable. Other: None. CT CERVICAL SPINE FINDINGS Alignment: Stable reversal of the normal cervical lordosis. Stable anterolisthesis at the C2-3 and C4-5 levels. No acute subluxations. Skull  base and vertebrae: No acute fracture. No primary bone lesion or focal pathologic process. Soft tissues and spinal canal: No prevertebral fluid or swelling. No visible canal hematoma. Disc levels: Stable multilevel degenerative changes. These include facet degenerative changes. Upper chest: Clear lung apices. Other: Dense bilateral carotid artery calcifications. IMPRESSION: 1. No skull fracture or intracranial hemorrhage. 2. No cervical spine fracture or traumatic subluxation. 3.  Stable moderate diffuse cerebral and cerebellar atrophy and mild-to-moderate chronic small vessel white matter ischemic changes in both cerebral hemispheres. 4. Stable reversal of the normal cervical lordosis and multilevel cervical spine degenerative changes. 5. Dense bilateral carotid artery atheromatous calcifications. Electronically Signed   By: Beckie Salts M.D.   On: 11/27/2019 19:48   CT Cervical Spine Wo Contrast  Result Date: 11/27/2019 CLINICAL DATA:  Neck pain following a fall. EXAM: CT HEAD WITHOUT CONTRAST CT CERVICAL SPINE WITHOUT CONTRAST TECHNIQUE: Multidetector CT imaging of the head and cervical spine was performed following the standard protocol without intravenous contrast. Multiplanar CT image reconstructions of the cervical spine were also generated. COMPARISON:  03/30/2019 FINDINGS: CT HEAD FINDINGS Brain: Stable moderately enlarged ventricles and subarachnoid spaces. Mild-to-moderate patchy white matter low density in both cerebral hemispheres without significant change. No intracranial hemorrhage, mass lesion or CT evidence of acute infarction. Vascular: No hyperdense vessel or unexpected calcification. Skull: Normal. Negative for fracture or focal lesion. Sinuses/Orbits: Unremarkable. Other: None. CT CERVICAL SPINE FINDINGS Alignment: Stable reversal of the normal cervical lordosis. Stable anterolisthesis at the C2-3 and C4-5 levels. No acute subluxations. Skull base and vertebrae: No acute fracture. No primary  bone lesion or focal pathologic process. Soft tissues and spinal canal: No prevertebral fluid or swelling. No visible canal hematoma. Disc levels: Stable multilevel degenerative changes. These include facet degenerative changes. Upper chest: Clear lung apices. Other: Dense bilateral carotid artery calcifications. IMPRESSION: 1. No skull fracture or intracranial hemorrhage. 2. No cervical spine fracture or traumatic subluxation. 3. Stable moderate diffuse cerebral and cerebellar atrophy and mild-to-moderate chronic small vessel white matter ischemic changes in both cerebral hemispheres. 4. Stable reversal of the normal cervical lordosis and multilevel cervical spine degenerative changes. 5. Dense bilateral carotid artery atheromatous calcifications. Electronically Signed   By: Beckie Salts M.D.   On: 11/27/2019 19:48    EKG: Independently reviewed. Interpretation : Sinus bradycardia rate of 52 Assessment/Plan 86 year old female with history of HTN, DM, CAD, recently hospitalized from 7/30-8/04 with breakthrough Covid pneumonia, complicated by  NSTEMI versus myocarditis treated with heparin infusion x48 hours presenting with syncope and collapse.  Work-up with sinus bradycardia of 52  Syncope and collapse in the setting of recent NSTEMI versus myocarditis  Symptomatic sinus bradycardia, -Troponin down to the 30s from the thousands during recent past hospitalization -Work-up significant for sinus bradycardia of 52 -Head CT with no acute findings -Patient has leukocytosis but otherwise no localizing signs of infection likely due to her recent Decadron in the treatment of Covid pneumonia -Cardiac monitoring to evaluate for possible arrhythmia -Recent echo on 8-21 showed EF 69% and grade 1 diastolic dysfunction -Consider cardiology consult in the a.m.    Essential hypertension -Continue home meds pending med rec    CAD (coronary artery disease) -Troponin with flat trend in the 30s and no chest pain  and no acute ST-T wave changes on EKG -Continue home antiplatelets statins but hold beta-blockers due to sinus bradycardia    Type 2 diabetes mellitus (HCC) -Sliding scale insulin pending med rec    CKD (chronic kidney disease) stage 3, GFR 30-59 ml/min -Renal function at baseline    DVT prophylaxis: Lovenox  Code Status: full code  Family Communication:  none  Disposition Plan: Back to previous home environment Consults called: none  Status: Observation    Andris Baumann MD [facility]     11/28/2019, 5:17 AM

## 2019-11-28 NOTE — ED Notes (Addendum)
Reached out to admitting provider re COVID swab order  [phone], per admitting MD, patient does not need a COVID swab

## 2019-11-28 NOTE — Progress Notes (Signed)
No charge progress note.  [name] is a 86 y.o. female with medical history significant for HTN, DM, CAD, recently hospitalized from 7/30-8/04 with breakthrough Covid pneumonia, complicated by elevated troponin in the thousands due to NSTEMI versus myocarditis treated with heparin infusion x48 hours completing Covid treatment, discharged on Decadron, who was in her usual state of health and feeling well when she apparently passed out.  Says the last thing she remembers was having lunch and the next thing she remembers is waking up on the floor with her son beside her with paramedics standing over her.  She cannot recall any symptoms preceding the fall.  Her son found her sitting on the floor with her head lying against a wooden chair.  She denies chest pain, shortness of breath.  Did have some mild pain on the right side of her neck which has subsided for the most part. Patient remained hemodynamically stable.  EKG with mild bradycardia.  Cardiology saw her and recommending 1 more night of observation without metoprolol and then outpatient follow-up with her cardiologist.  There is some concern of brady arrhythmia responsible for this syncopal episode.  Patient appears to be back to her baseline when seen today.  Normal exam.  She wants to go home and does not remember what happened.  She denies any dizziness or lightheadedness before passing out.

## 2019-11-28 NOTE — Progress Notes (Signed)
Report called to the floor.

## 2019-11-28 NOTE — ED Notes (Signed)
Pt requesting purewick; Gracie, RN notified and agreed. This tech placed purewick and chucks under pt at this time; no other needs voiced.

## 2019-11-29 DIAGNOSIS — R55 Syncope and collapse: Secondary | ICD-10-CM | POA: Diagnosis not present

## 2019-11-29 LAB — GLUCOSE, CAPILLARY
Glucose-Capillary: 137 mg/dL — ABNORMAL HIGH (ref 70–99)
Glucose-Capillary: 120 mg/dL — ABNORMAL HIGH (ref 70–99)

## 2019-11-29 NOTE — Discharge Summary (Signed)
Physician Discharge Summary  [name] [id number]:[id number] DOB: 1931-02-20 DOA: 11/28/2019  PCP: Danella Penton, MD  Admit date: 11/28/2019 Discharge date: 11/29/2019  Admitted From: Home Disposition:  Home  Recommendations for Outpatient Follow-up:  1. Follow up with PCP in 1-2 weeks 2. Please obtain BMP/CBC in one week 3. Please follow up on the following pending results:None  Home Health:No Equipment/Devices:None Discharge Condition: Stable CODE STATUS: Full Diet recommendation: Heart Healthy / Carb Modified   Brief/Interim Summary: Mary Milner Pageis a 86 y.o.femalewith medical history significant forHTN, DM, CAD, recently hospitalized from 7/30-8/04with breakthrough Covid pneumonia, complicated by elevated troponin in the thousands due to NSTEMI versus myocarditis treated with heparin infusion x48 hours completing Covid treatment, discharged on Decadron, who was in her usual state of health and feeling well when she apparently passed out. Says the last thing she remembers was having lunch and the next thing she remembers is waking up on the floor with her son beside her with paramedics standing over her. She cannot recall any symptoms preceding the fall. Her son found her sitting on the floor with her head lying against a wooden chair. She denies chest pain, shortness of breath. Did have some mild pain on the right side of her neck which has subsided for the most part. Had had bradycardia on arrival which is thought to be the cause of her syncopal episode.  Cardiology saw her and she was observed 1 extra night without metoprolol.  Patient appears back to her baseline.  Hemodynamically stable.  Metoprolol was discontinued and she will follow up with her cardiologist according to her scheduled appointment on Friday.  She will continue with rest of her home meds and follow-up with her providers.  Discharge Diagnoses:  Principal Problem:   Syncope and collapse Active Problems:    Essential hypertension   CAD (coronary artery disease)   Type 2 diabetes mellitus (HCC)   CKD (chronic kidney disease) stage 3, GFR 30-59 ml/min   Symptomatic sinus bradycardia   Syncope  Discharge Instructions  Discharge Instructions    Diet - low sodium heart healthy   Complete by: As directed    Discharge instructions   Complete by: As directed    It was pleasure taking care of you. Your cardiologist stopped the metoprolol as it might has caused a decrease in your heart rate resulted in that episode. Please follow-up closely with your cardiologist for further management.   Increase activity slowly   Complete by: As directed      Allergies as of 11/29/2019      Reactions   Butenafine Swelling   Tongue swelled and turned red   Levofloxacin    Other reaction(s): Other (See Comments) Tongue swelled and turned red   Limonene Other (See Comments)   Other reaction(s): Other (See Comments) Tongue swelled and turned red Tongue swelled and turned red   Terbinafine And Related Other (See Comments)   Tongue swelled and turned red      Medication List    STOP taking these medications   metoprolol tartrate 25 MG tablet Commonly known as: LOPRESSOR     TAKE these medications   acetaminophen 500 MG tablet Commonly known as: TYLENOL Take 1,000 mg by mouth every 6 (six) hours as needed. 1-2 tabs daily as needed For pain   amLODipine 5 MG tablet Commonly known as: NORVASC Take 5 mg by mouth daily.   aspirin EC 81 MG tablet Take 81 mg by mouth daily.   B-12  50 MCG Tabs Take 50 mcg by mouth daily.   cloNIDine 0.1 mg/24hr patch Commonly known as: CATAPRES - Dosed in mg/24 hr 0.1 mg once a week.   clopidogrel 75 MG tablet Commonly known as: PLAVIX Take 75 mg by mouth daily.   colchicine 0.6 MG tablet Take 0.6 mg by mouth daily.   donepezil 5 MG tablet Commonly known as: ARICEPT Take 5 mg by mouth daily.   Dulcolax 5 MG EC tablet Generic drug: bisacodyl Take 5 mg  by mouth as directed.   fluticasone 50 MCG/ACT nasal spray Commonly known as: FLONASE Place 2 sprays into both nostrils daily.   gabapentin 100 MG capsule Commonly known as: NEURONTIN Take 100 mg by mouth 3 (three) times daily.   glimepiride 1 MG tablet Commonly known as: AMARYL Take 1 mg by mouth daily with breakfast.   isosorbide mononitrate 60 MG 24 hr tablet Commonly known as: IMDUR Take 60 mg by mouth daily.   magnesium oxide 400 MG tablet Commonly known as: MAG-OX Take 400 mg by mouth daily.   nitroGLYCERIN 0.4 MG SL tablet Commonly known as: NITROSTAT Place 0.4 mg under the tongue every 5 (five) minutes as needed. For chest pain   omeprazole 20 MG capsule Commonly known as: PRILOSEC Take 20 mg by mouth daily.   rosuvastatin 20 MG tablet Commonly known as: CRESTOR Take 20 mg by mouth daily.   timolol 0.5 % ophthalmic solution Commonly known as: BETIMOL Place 1 drop into both eyes every evening.   tizanidine 2 MG capsule Commonly known as: ZANAFLEX Take 2 mg by mouth 3 (three) times daily as needed for muscle spasms.   torsemide 20 MG tablet Commonly known as: DEMADEX Take 30 mg by mouth every evening.       Follow-up Information    Schedule an appointment as soon as possible for a visit with Danella Penton, MD.   Specialty: Internal Medicine Why: The doctor's office will call with an appointment. Contact information: [facility] [address] [place] [address] [phone]              Allergies  Allergen Reactions  . Butenafine Swelling    Tongue swelled and turned red  . Levofloxacin     Other reaction(s): Other (See Comments) Tongue swelled and turned red  . Limonene Other (See Comments)    Other reaction(s): Other (See Comments) Tongue swelled and turned red Tongue swelled and turned red   . Terbinafine And Related Other (See Comments)    Tongue swelled and turned red     Consultations:  Cardiology  Procedures/Studies: CT Head Wo Contrast  Result Date: 11/27/2019 CLINICAL DATA:  Neck pain following a fall. EXAM: CT HEAD WITHOUT CONTRAST CT CERVICAL SPINE WITHOUT CONTRAST TECHNIQUE: Multidetector CT imaging of the head and cervical spine was performed following the standard protocol without intravenous contrast. Multiplanar CT image reconstructions of the cervical spine were also generated. COMPARISON:  03/30/2019 FINDINGS: CT HEAD FINDINGS Brain: Stable moderately enlarged ventricles and subarachnoid spaces. Mild-to-moderate patchy white matter low density in both cerebral hemispheres without significant change. No intracranial hemorrhage, mass lesion or CT evidence of acute infarction. Vascular: No hyperdense vessel or unexpected calcification. Skull: Normal. Negative for fracture or focal lesion. Sinuses/Orbits: Unremarkable. Other: None. CT CERVICAL SPINE FINDINGS Alignment: Stable reversal of the normal cervical lordosis. Stable anterolisthesis at the C2-3 and C4-5 levels. No acute subluxations. Skull base and vertebrae: No acute fracture. No primary bone lesion or focal pathologic process.  Soft tissues and spinal canal: No prevertebral fluid or swelling. No visible canal hematoma. Disc levels: Stable multilevel degenerative changes. These include facet degenerative changes. Upper chest: Clear lung apices. Other: Dense bilateral carotid artery calcifications. IMPRESSION: 1. No skull fracture or intracranial hemorrhage. 2. No cervical spine fracture or traumatic subluxation. 3. Stable moderate diffuse cerebral and cerebellar atrophy and mild-to-moderate chronic small vessel white matter ischemic changes in both cerebral hemispheres. 4. Stable reversal of the normal cervical lordosis and multilevel cervical spine degenerative changes. 5. Dense bilateral carotid artery atheromatous calcifications. Electronically Signed   By: Beckie Salts M.D.   On: 11/27/2019 19:48    CT Angio Chest PE W and/or Wo Contrast  Result Date: 11/17/2019 CLINICAL DATA:  Positive COVID, cough EXAM: CT ANGIOGRAPHY CHEST WITH CONTRAST TECHNIQUE: Multidetector CT imaging of the chest was performed using the standard protocol during bolus administration of intravenous contrast. Multiplanar CT image reconstructions and MIPs were obtained to evaluate the vascular anatomy. CONTRAST:  65mL OMNIPAQUE IOHEXOL 350 MG/ML SOLN COMPARISON:  March 30, 2019 FINDINGS: Cardiovascular: There is a optimal opacification of the pulmonary arteries. There is no central,segmental, or subsegmental filling defects within the pulmonary arteries. There is mild cardiomegaly. Coronary artery calcifications are seen. No pericardial effusion or thickening. No evidence right heart strain. There is a patent four-vessel arch with a common origin of the left vertebral artery off the arch. Scattered aortic atherosclerosis is noted. Mediastinum/Nodes: No hilar, mediastinal, or axillary adenopathy. Thyroid gland, trachea, and esophagus demonstrate no significant findings. Lungs/Pleura: There is a small focal patchy opacity seen at the left lung base. The right lung is clear. No pleural effusion is seen. Upper Abdomen: No acute abnormalities present in the visualized portions of the upper abdomen. Musculoskeletal: No chest wall abnormality. No acute or significant osseous findings. Review of the MIP images confirms the above findings. IMPRESSION: No central, segmental, or subsegmental pulmonary embolism. Small focal patchy area at the left lung base which could be due to atelectasis and/or early infectious etiology. Aortic Atherosclerosis (ICD10-I70.0). Electronically Signed   By: Jonna Clark M.D.   On: 11/17/2019 21:40   CT Cervical Spine Wo Contrast  Result Date: 11/27/2019 CLINICAL DATA:  Neck pain following a fall. EXAM: CT HEAD WITHOUT CONTRAST CT CERVICAL SPINE WITHOUT CONTRAST TECHNIQUE: Multidetector CT imaging of the head  and cervical spine was performed following the standard protocol without intravenous contrast. Multiplanar CT image reconstructions of the cervical spine were also generated. COMPARISON:  03/30/2019 FINDINGS: CT HEAD FINDINGS Brain: Stable moderately enlarged ventricles and subarachnoid spaces. Mild-to-moderate patchy white matter low density in both cerebral hemispheres without significant change. No intracranial hemorrhage, mass lesion or CT evidence of acute infarction. Vascular: No hyperdense vessel or unexpected calcification. Skull: Normal. Negative for fracture or focal lesion. Sinuses/Orbits: Unremarkable. Other: None. CT CERVICAL SPINE FINDINGS Alignment: Stable reversal of the normal cervical lordosis. Stable anterolisthesis at the C2-3 and C4-5 levels. No acute subluxations. Skull base and vertebrae: No acute fracture. No primary bone lesion or focal pathologic process. Soft tissues and spinal canal: No prevertebral fluid or swelling. No visible canal hematoma. Disc levels: Stable multilevel degenerative changes. These include facet degenerative changes. Upper chest: Clear lung apices. Other: Dense bilateral carotid artery calcifications. IMPRESSION: 1. No skull fracture or intracranial hemorrhage. 2. No cervical spine fracture or traumatic subluxation. 3. Stable moderate diffuse cerebral and cerebellar atrophy and mild-to-moderate chronic small vessel white matter ischemic changes in both cerebral hemispheres. 4. Stable reversal of the normal cervical lordosis  and multilevel cervical spine degenerative changes. 5. Dense bilateral carotid artery atheromatous calcifications. Electronically Signed   By: Beckie Salts M.D.   On: 11/27/2019 19:48   DG Chest Portable 1 View  Result Date: 11/17/2019 CLINICAL DATA:  Cough, chest pain, COVID positive. EXAM: PORTABLE CHEST 1 VIEW COMPARISON:  03/15/2019 FINDINGS: The cardiomediastinal contours are normal. Aortic atherosclerosis. Minimal subsegmental opacities  in the lung bases. Pulmonary vasculature is normal. No consolidation, pleural effusion, or pneumothorax. No acute osseous abnormalities are seen. IMPRESSION: Minimal subsegmental bibasilar opacities, may be atelectasis or atypical viral pneumonia in the setting of COVID. Aortic Atherosclerosis (ICD10-I70.0). Electronically Signed   By: Narda Rutherford M.D.   On: 11/17/2019 20:28   ECHOCARDIOGRAM COMPLETE  Result Date: 11/20/2019    ECHOCARDIOGRAM REPORT   Patient Name:   [name] Date of Exam: 11/20/2019 Medical Rec #:  [id number]     Height:       62.0 in Accession #:    [id number]    Weight:       136.7 lb Date of Birth:  1931-01-05     BSA:          1.626 m Patient Age:    86 years      BP:           176/59 mmHg Patient Gender: F             HR:           62 bpm. Exam Location:  [facility] Procedure: Cardiac Doppler and Color Doppler Indications:     Abnormal ECG 794.31  History:         Patient has no prior history of Echocardiogram examinations.                  Previous Myocardial Infarction; Risk Factors:Hypertension. S/P                  angioplast with stent.  Sonographer:     Cristela Blue RDCS (AE) Referring Phys:  [id number] Gardiner Ramus MATHEWS Diagnosing Phys: Harold Hedge MD  Sonographer Comments: No apical window. IMPRESSIONS  1. Left ventricular ejection fraction, by estimation, is 60 to 65%. Left ventricular ejection fraction by PLAX is 69 %. The left ventricle has normal function. The left ventricle has no regional wall motion abnormalities. There is mild left ventricular hypertrophy. Left ventricular diastolic parameters are consistent with Grade I diastolic dysfunction (impaired relaxation).  2. Right ventricular systolic function is normal. The right ventricular size is mildly enlarged.  3. The mitral valve was not well visualized. Trivial mitral valve regurgitation.  4. The aortic valve is grossly normal. Aortic valve regurgitation is not visualized. FINDINGS  Left Ventricle: Left ventricular  ejection fraction, by estimation, is 60 to 65%. Left ventricular ejection fraction by PLAX is 69 %. The left ventricle has normal function. The left ventricle has no regional wall motion abnormalities. The left ventricular internal cavity size was normal in size. There is mild left ventricular hypertrophy. Left ventricular diastolic parameters are consistent with Grade I diastolic dysfunction (impaired relaxation). Right Ventricle: The right ventricular size is mildly enlarged. No increase in right ventricular wall thickness. Right ventricular systolic function is normal. Left Atrium: Left atrial size was normal in size. Right Atrium: Right atrial size was normal in size. Pericardium: There is no evidence of pericardial effusion. Mitral Valve: The mitral valve was not well visualized. Trivial mitral valve regurgitation. Tricuspid Valve: The tricuspid valve is not well visualized. Tricuspid  valve regurgitation is mild. Aortic Valve: The aortic valve is grossly normal. Aortic valve regurgitation is not visualized. Pulmonic Valve: The pulmonic valve was not well visualized. Pulmonic valve regurgitation is mild. Aorta: The aortic root was not well visualized. IAS/Shunts: The interatrial septum was not assessed.  LEFT VENTRICLE PLAX 2D LV EF:         Left ventricular ejection fraction by PLAX is 69 %. LVIDd:         3.35 cm LVIDs:         2.09 cm LV PW:         1.43 cm LV IVS:        1.27 cm LVOT diam:     2.00 cm LVOT Area:     3.14 cm  LEFT ATRIUM         Index LA diam:    3.00 cm 1.84 cm/m                        PULMONIC VALVE AORTA                 PV Vmax:        0.72 m/s Ao Root diam: 2.80 cm PV Peak grad:Harold HedgeHg                       RVOT Peak grad: 4 mmHg   SHUNTS Systemic Diam: 2.00 cm [name] Fath MD Electronically signed by Harold Hedge MD Signature Date/Time: 11/20/2019/1:02:21 PM    Final     Subjective: Patient has no new complaint today.  She appears back to her baseline.  She was ambulated without  any difficulty or change in her heart rate.  Discharge Exam: Vitals:   11/29/19 0717 11/29/19 1123  BP: (!) 145/56 (!) 157/48  Pulse: 62 63  Resp: 16 18  Temp: 98.2 F (36.8 C) 98.4 F (36.9 C)  SpO2: 97% 99%   Vitals:   11/29/19 0342 11/29/19 0424 11/29/19 0717 11/29/19 1123  BP: (!) 166/59  (!) 145/56 (!) 157/48  Pulse: 68  62 63  Resp: Temp: 98.1 F (36.7 C)  98.2 F (36.8 C) 98.4 F (36.9 C)  TempSrc: Oral  Oral Oral  SpO2: 98%  97% 99%  Weight:  60.5 kg    Height:        General: Pt is alert, awake, not in acute distress Cardiovascular: RRR, S1/S2 +, no rubs, no gallops Respiratory: CTA bilaterally, no wheezing, no rhonchi Abdominal: Soft, NT, ND, bowel sounds + Extremities: no edema, no cyanosis   The results of significant diagnostics from this hospitalization (including imaging, microbiology, ancillary and laboratory) are listed below for reference.    Microbiology: No results found for this or any previous visit (from the past 240 hour(s)).   Labs: BNP (last 3 results) No results for input(s): BNP in the last 8760 hours. Basic Metabolic Panel: Recent Labs  Lab 11/27/19 1921  NA 138  K 3.6  CL 98  CO2 29  GLUCOSE 161*  BUN 39*  CREATININE 1.46*  CALCIUM 9.1   Liver Function Tests: No results for input(s): AST, ALT, ALKPHOS, BILITOT, PROT, ALBUMIN in the last 168 hours. No results for input(s): LIPASE, AMYLASE in the last 168 hours. No results for input(s): AMMONIA in the last 168 hours. CBC: Recent Labs  Lab 11/27/19 1921  WBC 20.4*  NEUTROABS 15.8*  HGB 12.5  HCT  37.0  MCV 97.9  PLT 362   Cardiac Enzymes: No results for input(s): CKTOTAL, CKMB, CKMBINDEX, TROPONINI in the last 168 hours. BNP: Invalid input(s): POCBNP CBG: Recent Labs  Lab 11/28/19 1258 11/28/19 1737 11/28/19 2051 11/29/19 0801 11/29/19 1124  GLUCAP 129* 83 154* 137* 120*   D-Dimer No results for input(s): DDIMER in the last 72 hours. Hgb  A1c No results for input(s): HGBA1C in the last 72 hours. Lipid Profile No results for input(s): CHOL, HDL, LDLCALC, TRIG, CHOLHDL, LDLDIRECT in the last 72 hours. Thyroid function studies No results for input(s): TSH, T4TOTAL, T3FREE, THYROIDAB in the last 72 hours.  Invalid input(s): FREET3 Anemia work up No results for input(s): VITAMINB12, FOLATE, FERRITIN, TIBC, IRON, RETICCTPCT in the last 72 hours. Urinalysis    Component Value Date/Time   COLORURINE STRAW (A) 11/27/2019 1922   APPEARANCEUR CLEAR (A) 11/27/2019 1922   APPEARANCEUR CLEAR 08/15/2012 1031   LABSPEC 1.005 11/27/2019 1922   LABSPEC 1.015 08/15/2012 1031   PHURINE 5.0 11/27/2019 1922   GLUCOSEU NEGATIVE 11/27/2019 1922   GLUCOSEU 300 mg/dL 16/01/9603 5409   HGBUR NEGATIVE 11/27/2019 1922   BILIRUBINUR NEGATIVE 11/27/2019 1922   BILIRUBINUR NEGATIVE 08/15/2012 1031   KETONESUR NEGATIVE 11/27/2019 1922   PROTEINUR NEGATIVE 11/27/2019 1922   NITRITE NEGATIVE 11/27/2019 1922   LEUKOCYTESUR NEGATIVE 11/27/2019 1922   LEUKOCYTESUR NEGATIVE 08/15/2012 1031   Sepsis Labs Invalid input(s): PROCALCITONIN,  WBC,  LACTICIDVEN Microbiology No results found for this or any previous visit (from the past 240 hour(s)).  Time coordinating discharge: Over 30 minutes  SIGNED:  Arnetha Courser, MD  [facility] 11/29/2019, 3:20 PM  If 7PM-7AM, please contact night-coverage [email or web address]  This record has been created using Conservation officer, historic buildings. Errors have been sought and corrected,but may not always be located. Such creation errors do not reflect on the standard of care.

## 2019-11-29 NOTE — Plan of Care (Signed)

## 2019-11-29 NOTE — Progress Notes (Signed)
Pt ambulated around the room using walker, no distress noted.

## 2019-11-29 NOTE — Discharge Instructions (Signed)
Near-Syncope Near-syncope is when you suddenly get weak or dizzy, or you feel like you might pass out (faint). This may also be called presyncope. This is due to a lack of blood flow to the brain. During an episode of near-syncope, you may:  Feel dizzy, weak, or light-headed.  Feel sick to your stomach (nauseous).  See all white or all black.  See spots.  Have cold, clammy skin. This condition is caused by a sudden decrease in blood flow to the brain. This decrease can result from various causes, but most of those causes are not dangerous. However, near-syncope may be a sign of a serious medical problem, so it is important to seek medical care. Follow these instructions at home: Medicines  Take over-the-counter and prescription medicines only as told by your doctor.  If you are taking blood pressure or heart medicine, get up slowly and spend many minutes getting ready to sit and then stand. This can help with dizziness. General instructions  Be aware of any changes in your symptoms.  Talk with your doctor about your symptoms. You may need to have testing to find the cause of your near-syncope.  If you start to feel like you might pass out, lie down right away. Raise (elevate) your feet above the level of your heart. Breathe deeply and steadily. Wait until all of the symptoms are gone.  Have someone stay with you until you feel stable.  Do not drive, use machinery, or play sports until your doctor says it is okay.  Drink enough fluid to keep your pee (urine) pale yellow.  Keep all follow-up visits as told by your doctor. This is important. Get help right away if you:  Have a seizure.  Have pain in your: ? Chest. ? Belly (abdomen). ? Back.  Faint once or more than once.  Have a very bad headache.  Are bleeding from your mouth or butt.  Have black or tarry poop (stool).  Have a very fast or uneven heartbeat (palpitations).  Are mixed up (confused).  Have trouble  walking.  Are very weak.  Have trouble seeing. These symptoms may be an emergency. Do not wait to see if the symptoms will go away. Get medical help right away. Call your local emergency services (911 in the [place] Do not drive yourself to the hospital. Summary  Near-syncope is when you suddenly get weak or dizzy, or you feel like you might pass out (faint).  This condition is caused by a lack of blood flow to the brain.  Near-syncope may be a sign of a serious medical problem, so it is important to seek medical care. This information is not intended to replace advice given to you by your health care provider. Make sure you discuss any questions you have with your health care provider. Document Revised: 07/29/2018 Document Reviewed: 02/23/2018 Elsevier Patient Education  2020 [employer]  

## 2019-11-29 NOTE — Progress Notes (Signed)
[facility] Encounter Note  Patient: [name] / Admit Date: 11/28/2019 / Date of Encounter: 11/29/2019, 7:59 AM   Subjective: No significant issues overnight.  No evidence of chest pain congestive heart failure syncope dizziness nausea or diaphoresis.  Patient has telemetry showing sinus bradycardia but no advanced heart block.  Patient does have multiple medical issues for which appropriate medication management has been used including hypertension and previous non-ST elevation myocardial infarction and hyperlipidemia.  The patient has had a discontinuation of metoprolol due to concerns of syncope and bradycardia for which there has been no further issues although clonidine can cause some bradycardia.  Review of Systems: Positive for: None Negative for: Vision change, hearing change, syncope, dizziness, nausea, vomiting,diarrhea, bloody stool, stomach pain, cough, congestion, diaphoresis, urinary frequency, urinary pain,skin lesions, skin rashes Others previously listed  Objective: Telemetry: Sinus bradycardia Physical Exam: Blood pressure (!) 145/56, pulse 62, temperature 98.2 F (36.8 C), temperature source Oral, resp. rate 16, height 5\' 2"  (1.575 m), weight 60.5 kg, SpO2 97 %. Body mass index is 24.4 kg/m. General: Well developed, well nourished, in no acute distress. Head: Normocephalic, atraumatic, sclera non-icteric, no xanthomas, nares are without discharge. Neck: No apparent masses Lungs: Normal respirations with no wheezes, no rhonchi, no rales , no crackles   Heart: Regular rate and rhythm, normal S1 S2, no murmur, no rub, no gallop, PMI is normal size and placement, carotid upstroke normal without bruit, jugular venous pressure normal Abdomen: Soft, non-tender, non-distended with normoactive bowel sounds. No hepatosplenomegaly. Abdominal aorta is normal size without bruit Extremities: No edema, no clubbing, no cyanosis, no ulcers,  Peripheral: 2+  radial, 2+ femoral, 2+ dorsal pedal pulses Neuro: Alert and oriented. Moves all extremities spontaneously. Psych:  Responds to questions appropriately with a normal affect.   Intake/Output Summary (Last 24 hours) at 11/29/2019 0759 Last data filed at 11/29/2019 0600 Gross per 24 hour  Intake 1409.05 ml  Output 500 ml  Net 909.05 ml    Inpatient Medications:  . amLODipine  5 mg Oral Daily  . aspirin EC  81 mg Oral Daily  . cloNIDine  0.1 mg Transdermal Weekly  . clopidogrel  75 mg Oral Daily  . enoxaparin (LOVENOX) injection  30 mg Subcutaneous Q24H  . gabapentin  100 mg Oral TID  . insulin aspart  0-15 Units Subcutaneous TID WC  . insulin aspart  0-5 Units Subcutaneous QHS  . isosorbide mononitrate  60 mg Oral Daily  . rosuvastatin  20 mg Oral Daily  . sodium chloride flush  3 mL Intravenous Q12H  . timolol  1 drop Both Eyes QPM   Infusions:  . sodium chloride 75 mL/hr at 11/29/19 0345    Labs: Recent Labs    11/27/19 1921  NA 138  K 3.6  CL 98  CO2 29  GLUCOSE 161*  BUN 39*  CREATININE 1.46*  CALCIUM 9.1   No results for input(s): AST, ALT, ALKPHOS, BILITOT, PROT, ALBUMIN in the last 72 hours. Recent Labs    11/27/19 1921  WBC 20.4*  NEUTROABS 15.8*  HGB 12.5  HCT 37.0  MCV 97.9  PLT 362   No results for input(s): CKTOTAL, CKMB, TROPONINI in the last 72 hours. Invalid input(s): POCBNP No results for input(s): HGBA1C in the last 72 hours.   Weights: Filed Weights   11/27/19 1913 11/28/19 1700 11/29/19 0424  Weight: 62 kg 61.7 kg 60.5 kg     Radiology/Studies:  CT Head Wo Contrast  Result  Date: 11/27/2019 CLINICAL DATA:  Neck pain following a fall. EXAM: CT HEAD WITHOUT CONTRAST CT CERVICAL SPINE WITHOUT CONTRAST TECHNIQUE: Multidetector CT imaging of the head and cervical spine was performed following the standard protocol without intravenous contrast. Multiplanar CT image reconstructions of the cervical spine were also generated. COMPARISON:   03/30/2019 FINDINGS: CT HEAD FINDINGS Brain: Stable moderately enlarged ventricles and subarachnoid spaces. Mild-to-moderate patchy white matter low density in both cerebral hemispheres without significant change. No intracranial hemorrhage, mass lesion or CT evidence of acute infarction. Vascular: No hyperdense vessel or unexpected calcification. Skull: Normal. Negative for fracture or focal lesion. Sinuses/Orbits: Unremarkable. Other: None. CT CERVICAL SPINE FINDINGS Alignment: Stable reversal of the normal cervical lordosis. Stable anterolisthesis at the C2-3 and C4-5 levels. No acute subluxations. Skull base and vertebrae: No acute fracture. No primary bone lesion or focal pathologic process. Soft tissues and spinal canal: No prevertebral fluid or swelling. No visible canal hematoma. Disc levels: Stable multilevel degenerative changes. These include facet degenerative changes. Upper chest: Clear lung apices. Other: Dense bilateral carotid artery calcifications. IMPRESSION: 1. No skull fracture or intracranial hemorrhage. 2. No cervical spine fracture or traumatic subluxation. 3. Stable moderate diffuse cerebral and cerebellar atrophy and mild-to-moderate chronic small vessel white matter ischemic changes in both cerebral hemispheres. 4. Stable reversal of the normal cervical lordosis and multilevel cervical spine degenerative changes. 5. Dense bilateral carotid artery atheromatous calcifications. Electronically Signed   By: Beckie Salts M.D.   On: 11/27/2019 19:48   CT Angio Chest PE W and/or Wo Contrast  Result Date: 11/17/2019 CLINICAL DATA:  Positive COVID, cough EXAM: CT ANGIOGRAPHY CHEST WITH CONTRAST TECHNIQUE: Multidetector CT imaging of the chest was performed using the standard protocol during bolus administration of intravenous contrast. Multiplanar CT image reconstructions and MIPs were obtained to evaluate the vascular anatomy. CONTRAST:  41mL OMNIPAQUE IOHEXOL 350 MG/ML SOLN COMPARISON:   March 30, 2019 FINDINGS: Cardiovascular: There is a optimal opacification of the pulmonary arteries. There is no central,segmental, or subsegmental filling defects within the pulmonary arteries. There is mild cardiomegaly. Coronary artery calcifications are seen. No pericardial effusion or thickening. No evidence right heart strain. There is a patent four-vessel arch with a common origin of the left vertebral artery off the arch. Scattered aortic atherosclerosis is noted. Mediastinum/Nodes: No hilar, mediastinal, or axillary adenopathy. Thyroid gland, trachea, and esophagus demonstrate no significant findings. Lungs/Pleura: There is a small focal patchy opacity seen at the left lung base. The right lung is clear. No pleural effusion is seen. Upper Abdomen: No acute abnormalities present in the visualized portions of the upper abdomen. Musculoskeletal: No chest wall abnormality. No acute or significant osseous findings. Review of the MIP images confirms the above findings. IMPRESSION: No central, segmental, or subsegmental pulmonary embolism. Small focal patchy area at the left lung base which could be due to atelectasis and/or early infectious etiology. Aortic Atherosclerosis (ICD10-I70.0). Electronically Signed   By: Jonna Clark M.D.   On: 11/17/2019 21:40   CT Cervical Spine Wo Contrast  Result Date: 11/27/2019 CLINICAL DATA:  Neck pain following a fall. EXAM: CT HEAD WITHOUT CONTRAST CT CERVICAL SPINE WITHOUT CONTRAST TECHNIQUE: Multidetector CT imaging of the head and cervical spine was performed following the standard protocol without intravenous contrast. Multiplanar CT image reconstructions of the cervical spine were also generated. COMPARISON:  03/30/2019 FINDINGS: CT HEAD FINDINGS Brain: Stable moderately enlarged ventricles and subarachnoid spaces. Mild-to-moderate patchy white matter low density in both cerebral hemispheres without significant change. No intracranial hemorrhage,  mass lesion or CT  evidence of acute infarction. Vascular: No hyperdense vessel or unexpected calcification. Skull: Normal. Negative for fracture or focal lesion. Sinuses/Orbits: Unremarkable. Other: None. CT CERVICAL SPINE FINDINGS Alignment: Stable reversal of the normal cervical lordosis. Stable anterolisthesis at the C2-3 and C4-5 levels. No acute subluxations. Skull base and vertebrae: No acute fracture. No primary bone lesion or focal pathologic process. Soft tissues and spinal canal: No prevertebral fluid or swelling. No visible canal hematoma. Disc levels: Stable multilevel degenerative changes. These include facet degenerative changes. Upper chest: Clear lung apices. Other: Dense bilateral carotid artery calcifications. IMPRESSION: 1. No skull fracture or intracranial hemorrhage. 2. No cervical spine fracture or traumatic subluxation. 3. Stable moderate diffuse cerebral and cerebellar atrophy and mild-to-moderate chronic small vessel white matter ischemic changes in both cerebral hemispheres. 4. Stable reversal of the normal cervical lordosis and multilevel cervical spine degenerative changes. 5. Dense bilateral carotid artery atheromatous calcifications. Electronically Signed   By: Beckie Salts M.D.   On: 11/27/2019 19:48   DG Chest Portable 1 View  Result Date: 11/17/2019 CLINICAL DATA:  Cough, chest pain, COVID positive. EXAM: PORTABLE CHEST 1 VIEW COMPARISON:  03/15/2019 FINDINGS: The cardiomediastinal contours are normal. Aortic atherosclerosis. Minimal subsegmental opacities in the lung bases. Pulmonary vasculature is normal. No consolidation, pleural effusion, or pneumothorax. No acute osseous abnormalities are seen. IMPRESSION: Minimal subsegmental bibasilar opacities, may be atelectasis or atypical viral pneumonia in the setting of COVID. Aortic Atherosclerosis (ICD10-I70.0). Electronically Signed   By: Narda Rutherford M.D.   On: 11/17/2019 20:28   ECHOCARDIOGRAM COMPLETE  Result Date: 11/20/2019     ECHOCARDIOGRAM REPORT   Patient Name:   [name] Date of Exam: 11/20/2019 Medical Rec #:  [id number]     Height:       62.0 in Accession #:    [id number]    Weight:       136.7 lb Date of Birth:  1930-12-07     BSA:          1.626 m Patient Age:    86 years      BP:           176/59 mmHg Patient Gender: F             HR:           62 bpm. Exam Location:  [facility] Procedure: Cardiac Doppler and Color Doppler Indications:     Abnormal ECG 794.31  History:         Patient has no prior history of Echocardiogram examinations.                  Previous Myocardial Infarction; Risk Factors:Hypertension. S/P                  angioplast with stent.  Sonographer:     Cristela Blue RDCS (AE) Referring Phys:  [id number] Gardiner Ramus MATHEWS Diagnosing Phys: Harold Hedge MD  Sonographer Comments: No apical window. IMPRESSIONS  1. Left ventricular ejection fraction, by estimation, is 60 to 65%. Left ventricular ejection fraction by PLAX is 69 %. The left ventricle has normal function. The left ventricle has no regional wall motion abnormalities. There is mild left ventricular hypertrophy. Left ventricular diastolic parameters are consistent with Grade I diastolic dysfunction (impaired relaxation).  2. Right ventricular systolic function is normal. The right ventricular size is mildly enlarged.  3. The mitral valve was not well visualized. Trivial mitral valve regurgitation.  4. The aortic valve is  grossly normal. Aortic valve regurgitation is not visualized. FINDINGS  Left Ventricle: Left ventricular ejection fraction, by estimation, is 60 to 65%. Left ventricular ejection fraction by PLAX is 69 %. The left ventricle has normal function. The left ventricle has no regional wall motion abnormalities. The left ventricular internal cavity size was normal in size. There is mild left ventricular hypertrophy. Left ventricular diastolic parameters are consistent with Grade I diastolic dysfunction (impaired relaxation). Right Ventricle: The right  ventricular size is mildly enlarged. No increase in right ventricular wall thickness. Right ventricular systolic function is normal. Left Atrium: Left atrial size was normal in size. Right Atrium: Right atrial size was normal in size. Pericardium: There is no evidence of pericardial effusion. Mitral Valve: The mitral valve was not well visualized. Trivial mitral valve regurgitation. Tricuspid Valve: The tricuspid valve is not well visualized. Tricuspid valve regurgitation is mild. Aortic Valve: The aortic valve is grossly normal. Aortic valve regurgitation is not visualized. Pulmonic Valve: The pulmonic valve was not well visualized. Pulmonic valve regurgitation is mild. Aorta: The aortic root was not well visualized. IAS/Shunts: The interatrial septum was not assessed.  LEFT VENTRICLE PLAX 2D LV EF:         Left ventricular ejection fraction by PLAX is 69 %. LVIDd:         3.35 cm LVIDs:         2.09 cm LV PW:         1.43 cm LV IVS:        1.27 cm LVOT diam:     2.00 cm LVOT Area:     3.14 cm  LEFT ATRIUM         Index LA diam:    3.00 cm 1.84 cm/m                        PULMONIC VALVE AORTA                 PV Vmax:        0.72 m/s Ao Root diam: 2.80 cm PV Peak grad:   2.1 mmHg                       RVOT Peak grad: 4 mmHg   SHUNTS Systemic Diam: 2.00 cm Harold Hedge MD Electronically signed by Harold Hedge MD Signature Date/Time: 11/20/2019/1:02:21 PM    Final      Assessment and Recommendation  86 y.o. female with known hypertension hyperlipidemia diabetes coronary artery disease status post recent non-ST elevation myocardial infarction placed on appropriate medication management now with an episode of syncope of unknown etiology although possible bradycardia contributed and still with slight amount of bradycardia despite discontinuation of beta-blocker but no evidence of syncope 1.  Continue to monitor with telemetry throughout ambulation this a.m. and follow for any significant issues 2.  No further  cardiac diagnostics necessary at this time 3.  Continue dual antiplatelet therapy for recent myocardial infarction 4.  Isosorbide for previous coronary artery disease 5.  Abstain from beta-blocker due to concerns of bradycardia 6.  If ambulating well and feeling well without evidence of further significant symptoms would be okay for discharged home from cardiac standpoint with follow-up next week  Signed, Arnoldo Hooker M.D. FACC

## 2019-11-29 NOTE — Progress Notes (Signed)
Discharge instructions reviewed with pt. Opportunity for questions made available. Pt verbalized understanding of discharge instructions.Telemetry & IV removed per protocol for d/c home. Son to pick up pt for transport.

## 2019-12-10 ENCOUNTER — Inpatient Hospital Stay
Admission: EM | Admit: 2019-12-10 | Discharge: 2019-12-13 | DRG: 247 | Disposition: A | Discharge status: Home Health | Payer: Medicare Other | Attending: Student | Admitting: Student

## 2019-12-10 DIAGNOSIS — I2510 Atherosclerotic heart disease of native coronary artery without angina pectoris: Secondary | ICD-10-CM | POA: Diagnosis present

## 2019-12-10 DIAGNOSIS — I161 Hypertensive emergency: Secondary | ICD-10-CM | POA: Diagnosis present

## 2019-12-10 DIAGNOSIS — Z8616 Personal history of COVID-19: Secondary | ICD-10-CM

## 2019-12-10 DIAGNOSIS — Z7982 Long term (current) use of aspirin: Secondary | ICD-10-CM

## 2019-12-10 DIAGNOSIS — R079 Chest pain, unspecified: Secondary | ICD-10-CM | POA: Diagnosis present

## 2019-12-10 DIAGNOSIS — Z888 Allergy status to other drugs, medicaments and biological substances status: Secondary | ICD-10-CM

## 2019-12-10 DIAGNOSIS — I214 Non-ST elevation (NSTEMI) myocardial infarction: Principal | ICD-10-CM | POA: Diagnosis present

## 2019-12-10 DIAGNOSIS — I130 Hypertensive heart and chronic kidney disease with heart failure and stage 1 through stage 4 chronic kidney disease, or unspecified chronic kidney disease: Secondary | ICD-10-CM | POA: Diagnosis present

## 2019-12-10 DIAGNOSIS — I160 Hypertensive urgency: Secondary | ICD-10-CM

## 2019-12-10 DIAGNOSIS — E1165 Type 2 diabetes mellitus with hyperglycemia: Secondary | ICD-10-CM | POA: Diagnosis present

## 2019-12-10 DIAGNOSIS — N183 Chronic kidney disease, stage 3 unspecified: Secondary | ICD-10-CM | POA: Diagnosis present

## 2019-12-10 DIAGNOSIS — I252 Old myocardial infarction: Secondary | ICD-10-CM

## 2019-12-10 DIAGNOSIS — N1831 Chronic kidney disease, stage 3a: Secondary | ICD-10-CM | POA: Diagnosis present

## 2019-12-10 DIAGNOSIS — E1159 Type 2 diabetes mellitus with other circulatory complications: Secondary | ICD-10-CM

## 2019-12-10 DIAGNOSIS — E785 Hyperlipidemia, unspecified: Secondary | ICD-10-CM | POA: Diagnosis present

## 2019-12-10 DIAGNOSIS — Z955 Presence of coronary angioplasty implant and graft: Secondary | ICD-10-CM

## 2019-12-10 DIAGNOSIS — Z79899 Other long term (current) drug therapy: Secondary | ICD-10-CM

## 2019-12-10 DIAGNOSIS — R5381 Other malaise: Secondary | ICD-10-CM | POA: Diagnosis present

## 2019-12-10 DIAGNOSIS — I5032 Chronic diastolic (congestive) heart failure: Secondary | ICD-10-CM | POA: Diagnosis present

## 2019-12-10 DIAGNOSIS — E119 Type 2 diabetes mellitus without complications: Secondary | ICD-10-CM

## 2019-12-10 DIAGNOSIS — I6523 Occlusion and stenosis of bilateral carotid arteries: Secondary | ICD-10-CM | POA: Diagnosis present

## 2019-12-10 DIAGNOSIS — Z8701 Personal history of pneumonia (recurrent): Secondary | ICD-10-CM

## 2019-12-10 DIAGNOSIS — Z7902 Long term (current) use of antithrombotics/antiplatelets: Secondary | ICD-10-CM

## 2019-12-11 ENCOUNTER — Emergency Department: Payer: Medicare Other

## 2019-12-11 ENCOUNTER — Encounter: Payer: Self-pay | Admitting: Emergency Medicine

## 2019-12-11 DIAGNOSIS — Z7982 Long term (current) use of aspirin: Secondary | ICD-10-CM | POA: Diagnosis not present

## 2019-12-11 DIAGNOSIS — R5381 Other malaise: Secondary | ICD-10-CM | POA: Diagnosis present

## 2019-12-11 DIAGNOSIS — I160 Hypertensive urgency: Secondary | ICD-10-CM

## 2019-12-11 DIAGNOSIS — Z955 Presence of coronary angioplasty implant and graft: Secondary | ICD-10-CM | POA: Diagnosis not present

## 2019-12-11 DIAGNOSIS — I5032 Chronic diastolic (congestive) heart failure: Secondary | ICD-10-CM | POA: Diagnosis present

## 2019-12-11 DIAGNOSIS — Z888 Allergy status to other drugs, medicaments and biological substances status: Secondary | ICD-10-CM | POA: Diagnosis not present

## 2019-12-11 DIAGNOSIS — Z79899 Other long term (current) drug therapy: Secondary | ICD-10-CM | POA: Diagnosis not present

## 2019-12-11 DIAGNOSIS — I161 Hypertensive emergency: Secondary | ICD-10-CM | POA: Diagnosis present

## 2019-12-11 DIAGNOSIS — I6523 Occlusion and stenosis of bilateral carotid arteries: Secondary | ICD-10-CM | POA: Diagnosis present

## 2019-12-11 DIAGNOSIS — I200 Unstable angina: Secondary | ICD-10-CM | POA: Diagnosis not present

## 2019-12-11 DIAGNOSIS — R079 Chest pain, unspecified: Secondary | ICD-10-CM

## 2019-12-11 DIAGNOSIS — E785 Hyperlipidemia, unspecified: Secondary | ICD-10-CM | POA: Diagnosis present

## 2019-12-11 DIAGNOSIS — N1831 Chronic kidney disease, stage 3a: Secondary | ICD-10-CM | POA: Diagnosis present

## 2019-12-11 DIAGNOSIS — E1165 Type 2 diabetes mellitus with hyperglycemia: Secondary | ICD-10-CM | POA: Diagnosis present

## 2019-12-11 DIAGNOSIS — I2510 Atherosclerotic heart disease of native coronary artery without angina pectoris: Secondary | ICD-10-CM | POA: Diagnosis present

## 2019-12-11 DIAGNOSIS — Z8616 Personal history of COVID-19: Secondary | ICD-10-CM | POA: Diagnosis not present

## 2019-12-11 DIAGNOSIS — I252 Old myocardial infarction: Secondary | ICD-10-CM | POA: Diagnosis not present

## 2019-12-11 DIAGNOSIS — I130 Hypertensive heart and chronic kidney disease with heart failure and stage 1 through stage 4 chronic kidney disease, or unspecified chronic kidney disease: Secondary | ICD-10-CM | POA: Diagnosis present

## 2019-12-11 DIAGNOSIS — Z7902 Long term (current) use of antithrombotics/antiplatelets: Secondary | ICD-10-CM | POA: Diagnosis not present

## 2019-12-11 DIAGNOSIS — Z8701 Personal history of pneumonia (recurrent): Secondary | ICD-10-CM | POA: Diagnosis not present

## 2019-12-11 DIAGNOSIS — I214 Non-ST elevation (NSTEMI) myocardial infarction: Secondary | ICD-10-CM | POA: Diagnosis present

## 2019-12-11 LAB — TROPONIN I (HIGH SENSITIVITY)
Troponin I (High Sensitivity): 12 ng/L (ref <18)
Troponin I (High Sensitivity): 582 ng/L (ref <18)
Troponin I (High Sensitivity): 922 ng/L (ref <18)

## 2019-12-11 LAB — PROTIME-INR
INR: 0.8 (ref 0.8–1.2)
Prothrombin Time: 11.2 seconds — ABNORMAL LOW (ref 11.4–15.2)

## 2019-12-11 LAB — GLUCOSE, CAPILLARY
Glucose-Capillary: 107 mg/dL — ABNORMAL HIGH (ref 70–99)
Glucose-Capillary: 101 mg/dL — ABNORMAL HIGH (ref 70–99)
Glucose-Capillary: 124 mg/dL — ABNORMAL HIGH (ref 70–99)
Glucose-Capillary: 149 mg/dL — ABNORMAL HIGH (ref 70–99)

## 2019-12-11 LAB — HEPARIN LEVEL (UNFRACTIONATED): Heparin Unfractionated: >3.6 IU/mL — ABNORMAL HIGH (ref 0.30–0.70)

## 2019-12-11 LAB — CBC
HCT: 34.6 % — ABNORMAL LOW (ref 36.0–46.0)
Hemoglobin: 12.1 g/dL (ref 12.0–15.0)
MCH: 33.9 pg (ref 26.0–34.0)
MCHC: 35 g/dL (ref 30.0–36.0)
MCV: 96.9 fL (ref 80.0–100.0)
Platelets: 255 10*3/uL (ref 150–400)
RBC: 3.57 MIL/uL — ABNORMAL LOW (ref 3.87–5.11)
RDW: 13.8 % (ref 11.5–15.5)
WBC: 8.5 10*3/uL (ref 4.0–10.5)
nRBC: 0 % (ref 0.0–0.2)

## 2019-12-11 LAB — BASIC METABOLIC PANEL
Anion gap: 13 (ref 5–15)
BUN: 23 mg/dL (ref 8–23)
CO2: 24 mmol/L (ref 22–32)
Calcium: 9.5 mg/dL (ref 8.9–10.3)
Chloride: 99 mmol/L (ref 98–111)
Creatinine, Ser: 1.31 mg/dL — ABNORMAL HIGH (ref 0.44–1.00)
GFR calc Af Amer: 43 mL/min — ABNORMAL LOW (ref >60)
GFR calc non Af Amer: 37 mL/min — ABNORMAL LOW (ref >60)
Glucose, Bld: 172 mg/dL — ABNORMAL HIGH (ref 70–99)
Potassium: 3.7 mmol/L (ref 3.5–5.1)
Sodium: 136 mmol/L (ref 135–145)

## 2019-12-11 LAB — SARS CORONAVIRUS 2 BY RT PCR (HOSPITAL ORDER, PERFORMED IN ~~LOC~~ HOSPITAL LAB): SARS Coronavirus 2: NEGATIVE

## 2019-12-11 MED ORDER — AMLODIPINE BESYLATE 5 MG PO TABS
5.0000 mg | ORAL_TABLET | Freq: Every day | ORAL | Status: DC
  Administered 2019-12-12 – 2019-12-13 (×2): 5 mg via ORAL
  Filled 2019-12-11 (×2): qty 1

## 2019-12-11 MED ORDER — SODIUM CHLORIDE 0.9 % IV SOLN: INTRAVENOUS | Status: DC

## 2019-12-11 MED ORDER — SODIUM CHLORIDE 0.9% FLUSH: 3.0000 mL | INTRAVENOUS | Status: DC | PRN

## 2019-12-11 MED ORDER — SODIUM CHLORIDE 0.9% FLUSH
3.0000 mL | Freq: Two times a day (BID) | INTRAVENOUS | Status: DC
  Administered 2019-12-12 – 2019-12-13 (×2): 3 mL via INTRAVENOUS

## 2019-12-11 MED ORDER — HEPARIN BOLUS VIA INFUSION
3500.0000 [IU] | Freq: Once | INTRAVENOUS | Status: AC
  Administered 2019-12-11: 3500 [IU] via INTRAVENOUS
  Filled 2019-12-11: qty 3500

## 2019-12-11 MED ORDER — ROSUVASTATIN CALCIUM 10 MG PO TABS
20.0000 mg | ORAL_TABLET | Freq: Every day | ORAL | Status: DC
  Administered 2019-12-11 – 2019-12-13 (×3): 20 mg via ORAL
  Filled 2019-12-11: qty 1
  Filled 2019-12-11: qty 2
  Filled 2019-12-11: qty 1
  Filled 2019-12-11: qty 2

## 2019-12-11 MED ORDER — INSULIN ASPART 100 UNIT/ML ~~LOC~~ SOLN
0.0000 [IU] | Freq: Three times a day (TID) | SUBCUTANEOUS | Status: DC
  Administered 2019-12-11: 1 [IU] via SUBCUTANEOUS
  Administered 2019-12-12 – 2019-12-13 (×2): 2 [IU] via SUBCUTANEOUS
  Filled 2019-12-11 (×3): qty 1

## 2019-12-11 MED ORDER — ISOSORBIDE MONONITRATE ER 60 MG PO TB24
60.0000 mg | ORAL_TABLET | Freq: Every day | ORAL | Status: DC
  Administered 2019-12-11 – 2019-12-13 (×3): 60 mg via ORAL
  Filled 2019-12-11 (×3): qty 1

## 2019-12-11 MED ORDER — NITROGLYCERIN 0.4 MG SL SUBL: 0.4000 mg | SUBLINGUAL_TABLET | SUBLINGUAL | Status: DC | PRN

## 2019-12-11 MED ORDER — ENOXAPARIN SODIUM 40 MG/0.4ML ~~LOC~~ SOLN: 40.0000 mg | SUBCUTANEOUS | Status: DC

## 2019-12-11 MED ORDER — HEPARIN (PORCINE) 25000 UT/250ML-% IV SOLN
500.0000 [IU]/h | INTRAVENOUS | Status: DC
  Administered 2019-12-11: 500 [IU]/h via INTRAVENOUS
  Filled 2019-12-11: qty 250

## 2019-12-11 MED ORDER — IOHEXOL 350 MG/ML SOLN
75.0000 mL | Freq: Once | INTRAVENOUS | Status: AC | PRN
  Administered 2019-12-11: 60 mL via INTRAVENOUS

## 2019-12-11 MED ORDER — ASPIRIN EC 325 MG PO TBEC
325.0000 mg | DELAYED_RELEASE_TABLET | Freq: Once | ORAL | Status: AC
  Administered 2019-12-11: 325 mg via ORAL
  Filled 2019-12-11: qty 1

## 2019-12-11 MED ORDER — CLOPIDOGREL BISULFATE 75 MG PO TABS
75.0000 mg | ORAL_TABLET | Freq: Every day | ORAL | Status: DC
  Administered 2019-12-12: 75 mg via ORAL
  Filled 2019-12-11: qty 1

## 2019-12-11 MED ORDER — AMLODIPINE BESYLATE 5 MG PO TABS: 5.0000 mg | ORAL_TABLET | Freq: Every day | ORAL | Status: DC

## 2019-12-11 MED ORDER — AMLODIPINE BESYLATE 5 MG PO TABS
10.0000 mg | ORAL_TABLET | Freq: Every day | ORAL | Status: DC
  Filled 2019-12-11: qty 2

## 2019-12-11 MED ORDER — INSULIN ASPART 100 UNIT/ML ~~LOC~~ SOLN: 0.0000 [IU] | Freq: Every day | SUBCUTANEOUS | Status: DC

## 2019-12-11 MED ORDER — TORSEMIDE 20 MG PO TABS: 30.0000 mg | ORAL_TABLET | Freq: Every evening | ORAL | Status: DC

## 2019-12-11 MED ORDER — HEPARIN (PORCINE) 25000 UT/250ML-% IV SOLN
500.0000 [IU]/h | INTRAVENOUS | Status: DC
  Administered 2019-12-11: 700 [IU]/h via INTRAVENOUS
  Filled 2019-12-11 (×2): qty 250

## 2019-12-11 MED ORDER — LABETALOL HCL 5 MG/ML IV SOLN
10.0000 mg | Freq: Once | INTRAVENOUS | Status: DC
  Filled 2019-12-11: qty 4

## 2019-12-11 MED ORDER — TORSEMIDE 20 MG PO TABS
20.0000 mg | ORAL_TABLET | Freq: Every evening | ORAL | Status: DC
  Administered 2019-12-11 – 2019-12-12 (×2): 20 mg via ORAL
  Filled 2019-12-11 (×2): qty 1

## 2019-12-11 MED ORDER — ONDANSETRON HCL 4 MG/2ML IJ SOLN: 4.0000 mg | Freq: Four times a day (QID) | INTRAMUSCULAR | Status: DC | PRN

## 2019-12-11 MED ORDER — CLONIDINE HCL 0.1 MG/24HR TD PTWK
0.1000 mg | MEDICATED_PATCH | TRANSDERMAL | Status: DC
  Administered 2019-12-11: 0.1 mg via TRANSDERMAL
  Filled 2019-12-11: qty 1

## 2019-12-11 MED ORDER — SODIUM CHLORIDE 0.9 % IV SOLN: 250.0000 mL | INTRAVENOUS | Status: DC | PRN

## 2019-12-11 MED ORDER — ASPIRIN 81 MG PO CHEW: 81.0000 mg | CHEWABLE_TABLET | ORAL | Status: DC

## 2019-12-11 MED ORDER — SODIUM CHLORIDE 0.9 % WEIGHT BASED INFUSION: 1.0000 mL/kg/h | INTRAVENOUS | Status: DC

## 2019-12-11 MED ORDER — ACETAMINOPHEN 325 MG PO TABS: 650.0000 mg | ORAL_TABLET | ORAL | Status: DC | PRN

## 2019-12-11 MED ORDER — SODIUM CHLORIDE 0.9 % WEIGHT BASED INFUSION: 3.0000 mL/kg/h | INTRAVENOUS | Status: DC

## 2019-12-11 NOTE — Progress Notes (Addendum)
[name] is a 86 y.o. female with a history of hypertension, diabetes, significant cardiac history with CAD s/p multiple stents,  multiple recent ED visits for different episodes including syncope secondary to beta-blocker induced bradycardia at which time beta-blocker was discontinued and NSTEMI versus myocarditis in July 2021, multiple medication adjustments recently who presented with typical chest pain and was admitted early this morning by Dr. Para March for chest pain and hypertensive urgency vs. Emergency.  ED Course: On arrival blood pressure was 203/71 with heart rate of 105.  Afebrile with O2 sat 100% on room air.  Troponin 12, creatinine 1.31.  CTA chest with no evidence of PE and no acute intrathoracic pathology.  EKG showed sinus tachycardia with nonspecific ST-T wave changes.  Nitropaste placed given labetalol for suspicion of hypertensive emergency causing chest pain.   Currently, patient is chest pain free. Does not recall the medications that she takes. Lives with her son but manages her own meds. Does not believe she skipped any doses.    PE: General: Awake and alert, no acute distress  Heart: S1 and S2 auscultated, no murmurs  Lungs: Clear to auscultation bilaterally, no wheeze    A/P  1. Typical chest pain  NSTEMI  a. Initial EKG with ST depressions in inferolateral leads b. Repeat EKG with resolved ST depressions with the exception of significant Q wave in III and T wave inversion in III on person read c. Troponin 12-> 582-> 922 d. Recent echo unremarkable e. Currently chest pain free f. Given Heparin bolus and started Heparin drip g. Give aspirin h. Trend trop i. telemetry j. Continue home crestor k. Cardiology consulted   2. Hypertensive emergency leading to NSTEMI a. BP on presentation 203/71 -> 148/37 currently and symptomatically improved b. Beta blocker recently discontinued due to bradycardia induced syncope c. Per cardio: restart home meds: amlodipine,  torsemide and Imdur.   3. Type 2 diabetes stable  4. CKD 3a stable  5. CAD a. Per cardio: status post MI in 1980s with multiple stents, with most recent cardiac catheterization in 2014 revealing patent stents with  60% stenosis proximal LAD.  b. Continue crestor  c. Plan as above      Jae Dire, DO [facility] Pager [phone]

## 2019-12-11 NOTE — H&P (Signed)
History and Physical    [name] KPT:[id number] DOB: [birth year] DOA: 12/10/2019  PCP: Danella Penton, MD   Patient coming from: home  I have personally briefly reviewed patient's old medical records in [facility] Link  Chief Complaint: Chest pain  HPI: [name] is a 86 y.o. female with medical history significant for HTN, DM, CAD with several stent placements, recent breakthrough Covid pneumonia July 2021, recent NSTEMI versus myocarditis July 2021, recent syncope admission 2 weeks prior on 8/10 secondary to beta-blocker induced bradycardia, who now presents with chest pain starting an hour prior to arrival.  Patient described chest pain as in the middle of the chest nonradiating of severe intensity, with no associated nausea vomiting or diaphoresis or shortness of breath and no aggravating or alleviating factors.  She has no cough fever or chills.  She was feeling fine prior to the onset of the chest pain.  Patient had a follow-up appointment with her PCP on 8/17 with several medication adjustments, including discontinuation of her glimepiride due to hypoglycemia contributing to her syncopal episode, as well as discontinuation of her torsemide in addition to previously discontinued metoprolol.   ED Course: On arrival blood pressure was 203/71 with heart rate of 105.  Afebrile with O2 sat 100% on room air.  Troponin 12, creatinine 1.31.  CTA chest with no evidence of PE and no acute intrathoracic pathology.  EKG showed sinus tachycardia with nonspecific ST-T wave changes.  Nitropaste placed given labetalol for suspicion of hypertensive emergency causing chest pain.  Hospitalist consulted for admission.  Review of Systems: As per HPI otherwise all other systems on review of systems negative.    Past Medical History:  Diagnosis Date  . Hypercholesteremia   . Hypertension   . Myocardial infarction (HCC) 2008  . S/P angioplasty with stent     Past Surgical History:  Procedure  Laterality Date  . ABDOMINAL HYSTERECTOMY    . APPENDECTOMY    . CORONARY ANGIOPLASTY WITH STENT PLACEMENT       reports that she has never smoked. She has never used smokeless tobacco. She reports that she does not drink alcohol and does not use drugs.  Allergies  Allergen Reactions  . Butenafine Swelling    Tongue swelled and turned red  . Levofloxacin     Other reaction(s): Other (See Comments) Tongue swelled and turned red  . Limonene Other (See Comments)    Other reaction(s): Other (See Comments) Tongue swelled and turned red Tongue swelled and turned red   . Terbinafine And Related Other (See Comments)    Tongue swelled and turned red    Family History  Problem Relation Age of Onset  . Breast cancer Maternal Aunt       Prior to Admission medications   Medication Sig Start Date End Date Taking? Authorizing Provider  acetaminophen (TYLENOL) 500 MG tablet Take 1,000 mg by mouth every 6 (six) hours as needed. 1-2 tabs daily as needed For pain    [provider]  amLODipine (NORVASC) 5 MG tablet Take 5 mg by mouth daily. 11/14/19   [provider]  aspirin EC 81 MG tablet Take 81 mg by mouth daily.    [provider]  cloNIDine (CATAPRES - DOSED IN MG/24 HR) 0.1 mg/24hr patch 0.1 mg once a week. 11/14/19   [provider]  clopidogrel (PLAVIX) 75 MG tablet Take 75 mg by mouth daily.    [provider]  colchicine 0.6 MG tablet  Take 0.6 mg by mouth daily. 11/16/19   [provider]  Cyanocobalamin (B-12) 50 MCG TABS Take 50 mcg by mouth daily. 08/30/19   [provider]  donepezil (ARICEPT) 5 MG tablet Take 5 mg by mouth daily. 11/14/19   [provider]  DULCOLAX 5 MG EC tablet Take 5 mg by mouth as directed. 08/30/19   [provider]  fluticasone (FLONASE) 50 MCG/ACT nasal spray Place 2 sprays into both nostrils daily. 11/16/19   [provider]  gabapentin (NEURONTIN) 100 MG capsule Take  100 mg by mouth 3 (three) times daily.  02/14/15   [provider]  glimepiride (AMARYL) 1 MG tablet Take 1 mg by mouth daily with breakfast.  08/29/18 11/28/19  [provider]  isosorbide mononitrate (IMDUR) 60 MG 24 hr tablet Take 60 mg by mouth daily.    [provider]  magnesium oxide (MAG-OX) 400 MG tablet Take 400 mg by mouth daily.     [provider]  nitroGLYCERIN (NITROSTAT) 0.4 MG SL tablet Place 0.4 mg under the tongue every 5 (five) minutes as needed. For chest pain    [provider]  omeprazole (PRILOSEC) 20 MG capsule Take 20 mg by mouth daily.  11/22/18   [provider]  rosuvastatin (CRESTOR) 20 MG tablet Take 20 mg by mouth daily.    [provider]  timolol (BETIMOL) 0.5 % ophthalmic solution Place 1 drop into both eyes every evening.    [provider]  tizanidine (ZANAFLEX) 2 MG capsule Take 2 mg by mouth 3 (three) times daily as needed for muscle spasms.    [provider]  torsemide (DEMADEX) 20 MG tablet Take 30 mg by mouth every evening.     [provider]    Physical Exam: Vitals:   12/11/19 0019 12/11/19 0100 12/11/19 0300  BP: (!) 203/71 (!) 138/47 (!) 140/48  Pulse: (!) 105 86 82  Resp: 18 11 (!) 22  Temp: 98.9 F (37.2 C)    TempSrc: Oral    SpO2: 100%       Vitals:   12/11/19 0019 12/11/19 0100 12/11/19 0300  BP: (!) 203/71 (!) 138/47 (!) 140/48  Pulse: (!) 105 86 82  Resp: 18 11 (!) 22  Temp: 98.9 F (37.2 C)    TempSrc: Oral    SpO2: 100%        Constitutional: Alert and oriented x 3 . Not in any apparent distress HEENT:      Head: Normocephalic and atraumatic.         Eyes: PERLA, EOMI, Conjunctivae are normal. Sclera is non-icteric.       Mouth/Throat: Mucous membranes are moist.       Neck: Supple with no signs of meningismus. Cardiovascular: Regular rate and rhythm. No murmurs, gallops, or rubs. 2+ symmetrical distal pulses are present . No JVD.  No LE edema Respiratory: Respiratory effort normal .Lungs sounds clear bilaterally. No wheezes, crackles, or rhonchi.  Gastrointestinal: Soft, non tender, and non distended with positive bowel sounds. No rebound or guarding. Genitourinary: No CVA tenderness. Musculoskeletal: Nontender with normal range of motion in all extremities. No cyanosis, or erythema of extremities. Neurologic: Normal speech and language. Face is symmetric. Moving all extremities. No gross focal neurologic deficits . Skin: Skin is warm, dry.  No rash or ulcers Psychiatric: Mood and affect are normal Speech and behavior are normal   Labs on Admission: I have personally reviewed following labs and imaging studies  CBC: Recent Labs  Lab 12/11/19 0001  WBC 8.5  HGB 12.1  HCT 34.6*  MCV 96.9  PLT 255   Basic Metabolic Panel: Recent Labs  Lab 12/11/19 0001  NA 136  K 3.7  CL 99  CO2 24  GLUCOSE 172*  BUN 23  CREATININE 1.31*  CALCIUM 9.5   GFR: Estimated Creatinine Clearance: 26.4 mL/min (A) (by C-G formula based on SCr of 1.31 mg/dL (H)). Liver Function Tests: No results for input(s): AST, ALT, ALKPHOS, BILITOT, PROT, ALBUMIN in the last 168 hours. No results for input(s): LIPASE, AMYLASE in the last 168 hours. No results for input(s): AMMONIA in the last 168 hours. Coagulation Profile: Recent Labs  Lab 12/11/19 0001  INR 0.8   Cardiac Enzymes: No results for input(s): CKTOTAL, CKMB, CKMBINDEX, TROPONINI in the last 168 hours. BNP (last 3 results) No results for input(s): PROBNP in the last 8760 hours. HbA1C: No results for input(s): HGBA1C in the last 72 hours. CBG: No results for input(s): GLUCAP in the last 168 hours. Lipid Profile: No results for input(s): CHOL, HDL, LDLCALC, TRIG, CHOLHDL, LDLDIRECT in the last 72 hours. Thyroid Function Tests: No results for input(s): TSH, T4TOTAL, FREET4, T3FREE, THYROIDAB in the last 72 hours. Anemia Panel: No results for input(s): VITAMINB12,  FOLATE, FERRITIN, TIBC, IRON, RETICCTPCT in the last 72 hours. Urine analysis:    Component Value Date/Time   COLORURINE STRAW (A) 11/27/2019 1922   APPEARANCEUR CLEAR (A) 11/27/2019 1922   APPEARANCEUR CLEAR 08/15/2012 1031   LABSPEC 1.005 11/27/2019 1922   LABSPEC 1.015 08/15/2012 1031   PHURINE 5.0 11/27/2019 1922   GLUCOSEU NEGATIVE 11/27/2019 1922   GLUCOSEU 300 mg/dL 25/95/6387 5643   HGBUR NEGATIVE 11/27/2019 1922   BILIRUBINUR NEGATIVE 11/27/2019 1922   BILIRUBINUR NEGATIVE 08/15/2012 1031   KETONESUR NEGATIVE 11/27/2019 1922   PROTEINUR NEGATIVE 11/27/2019 1922   NITRITE NEGATIVE 11/27/2019 1922   LEUKOCYTESUR NEGATIVE 11/27/2019 1922   LEUKOCYTESUR NEGATIVE 08/15/2012 1031    Radiological Exams on Admission: CT Angio Chest PE W and/or Wo Contrast  Result Date: 12/11/2019 CLINICAL DATA:  86 year old female with concern for pulmonary embolism. EXAM: CT ANGIOGRAPHY CHEST WITH CONTRAST TECHNIQUE: Multidetector CT imaging of the chest was performed using the standard protocol during bolus administration of intravenous contrast. Multiplanar CT image reconstructions and MIPs were obtained to evaluate the vascular anatomy. CONTRAST:  5mL OMNIPAQUE IOHEXOL 350 MG/ML SOLN COMPARISON:  Chest CT dated 11/17/2019. FINDINGS: Cardiovascular: There is no cardiomegaly or pericardial effusion. There is 3 vessel coronary vascular calcification. Moderate atherosclerotic calcification of the thoracic aorta. No aneurysmal dilatation or dissection. The origins of the great vessels of the aortic arch appear patent. Evaluation of the pulmonary arteries is limited due to respiratory motion artifact. No pulmonary artery embolus identified. Mediastinum/Nodes: There is no hilar or mediastinal adenopathy. The esophagus and the thyroid gland are grossly unremarkable. No mediastinal fluid collection. Lungs/Pleura: There are bibasilar dependent atelectasis. No focal consolidation, pleural effusion, or  pneumothorax. The central airways are patent. Upper Abdomen: No acute abnormality. Musculoskeletal: Degenerative changes of the spine. No acute osseous pathology. Review of the MIP images confirms the above findings. IMPRESSION: 1. No acute intrathoracic pathology. No CT evidence of pulmonary embolism. 2. Aortic Atherosclerosis (ICD10-I70.0). Electronically Signed   By: Elgie Collard M.D.   On: 12/11/2019 02:19   DG Chest Portable 1 View  Result Date: 12/11/2019 CLINICAL DATA:  86 year old female with chest pain. EXAM: PORTABLE CHEST 1 VIEW COMPARISON:  Chest radiograph dated 11/17/2019  and CT dated 11/17/2019 FINDINGS: No focal consolidation, pleural effusion, pneumothorax. The cardiac silhouette is within limits. Coronary vascular calcification or stent. Atherosclerotic calcification of the aorta. No acute osseous pathology. IMPRESSION: No active disease. Electronically Signed   By: Elgie Collard M.D.   On: 12/11/2019 00:32    EKG: Independently reviewed. Interpretation : Normal sinus rhythm with no acute ST-T wave changes  Assessment/Plan 86 year old female with history of HTN, DM, CAD with stent placements to mid and distal LAD, proximal and mid RCA, history of recent breakthrough Covid pneumonia July 2021, recent NSTEMI versus myocarditis July 2021, recent syncope admission on 8/10 secondary to beta-blocker induced bradycardia, presenting with chest pain     Chest pain   CAD (coronary artery disease) with history of stent angioplasty -86 year old female with extensive cardiac history including stent placements to LAD and RCA with recent NSTEMI versus myocarditis with troponins  in the thousands presenting with acute chest pain -First troponin is 12.  EKG with nonspecific ST-T wave changes -Continue Nitropaste placed in the ER -Morphine for breakthrough pain -Continue antiplatelets and statins. -Beta-blockers recently discontinued due to suspected symptomatic bradycardia with admission  for syncope on 8/10 -Consider cardiology consult in the a.m.  Patient follows with Dr. Darrold Junker    Hypertensive urgency -SBP over 200 in the ER -Patient recently had discontinuation of metoprolol for suspected symptomatic bradycardia (rate of 52) with syncope on 8/10 and then her PCP discontinued her torsemide on 8/17    Type 2 diabetes mellitus (HCC) -Sliding scale insulin coverage with addition of home meds as appropriate pending med rec    CKD (chronic kidney disease) stage 3, GFR 30-59 ml/min -Renal function at baseline    DVT prophylaxis: Lovenox  Code Status: full code  Family Communication:  none  Disposition Plan: Back to previous home environment Consults called: none  Status: Observation    Andris Baumann MD [facility]     12/11/2019, 3:39 AM

## 2019-12-11 NOTE — Progress Notes (Addendum)
ANTICOAGULATION CONSULT NOTE - Initial Consult  Pharmacy Consult for heparin Indication: chest pain/ACS  Allergies  Allergen Reactions  . Butenafine Swelling    Tongue swelled and turned red  . Levofloxacin Swelling and Other (See Comments)    Other reaction(s): Other (See Comments) Tongue swelled and turned red  . Limonene Other (See Comments)    Other reaction(s): Other (See Comments) Tongue swelled and turned red Tongue swelled and turned red   . Terbinafine And Related Other (See Comments)    Tongue swelled and turned red    Patient Measurements: Heparin Dosing Weight: 60 kg    Vital Signs: Temp: 98.9 F (37.2 C) (08/23 0019) Temp Source: Oral (08/23 0019) BP: 165/45 (08/23 0630) Pulse Rate: 67 (08/23 0500)  Labs: Recent Labs    12/11/19 0001 12/11/19 0625  HGB 12.1  --   HCT 34.6*  --   PLT 255  --   LABPROT 11.2*  --   INR 0.8  --   CREATININE 1.31*  --   TROPONINIHS 12 582*    Estimated Creatinine Clearance: 26.4 mL/min (A) (by C-G formula based on SCr of 1.31 mg/dL (H)).   Medical History: Past Medical History:  Diagnosis Date  . Hypercholesteremia   . Hypertension   . Myocardial infarction (HCC) 2008  . S/P angioplasty with stent     Medications:  Scheduled:  . cloNIDine  0.1 mg Transdermal Weekly  . insulin aspart  0-5 Units Subcutaneous QHS  . insulin aspart  0-9 Units Subcutaneous TID WC  . labetalol  10 mg Intravenous Once     Assessment: 86 y.o. female with history of CAD and stent placements in LAD and RCA and admission for NSTEMI vs myocarditis last month presenting 12/11/19 with chest pain and suspected NSTEMI with high sens troponin 582. Baseline Hgb and platelets within normal limits. No prior to admission anticoagulants noted. Pharmacy has been consulted for IV heparin dosing for NSTEMI.  Goal of Therapy:  Heparin level 0.3-0.7 units/ml Monitor platelets by anticoagulation protocol: Yes   Plan:  Bolus heparin at 3600 units  IV x 1 Then start heparin infusion at 700 units/hr  Check anti-Xa in 8 hours Monitor CBC, daily anti-Xa level  Continue to monitor for signs/symptoms of bleeding   Harlow Mares, PharmD Clinical Pharmacist  12/11/2019   9:27 AM

## 2019-12-11 NOTE — ED Notes (Signed)
Pt assisted to bedside commode for large BM at this time, complete bed changed performed and purewick replaced. Pt returned to bed in position of comfort and call bell in reach. Lights dimmed and watching TV

## 2019-12-11 NOTE — Progress Notes (Signed)
ANTICOAGULATION CONSULT NOTE   Pharmacy Consult for Heparin Indication: chest pain/ACS  Allergies  Allergen Reactions   Butenafine Swelling    Tongue swelled and turned red   Levofloxacin Swelling and Other (See Comments)    Other reaction(s): Other (See Comments) Tongue swelled and turned red   Limonene Other (See Comments)    Other reaction(s): Other (See Comments) Tongue swelled and turned red Tongue swelled and turned red    Terbinafine And Related Other (See Comments)    Tongue swelled and turned red    Patient Measurements: Heparin Dosing Weight: 60 kg    Vital Signs: Temp: 98.4 F (36.9 C) (08/23 0800) Temp Source: Oral (08/23 0800) BP: 154/44 (08/23 1400)  Labs: Recent Labs    12/11/19 0001 12/11/19 0625 12/11/19 1145 12/11/19 1800  HGB 12.1  --   --   --   HCT 34.6*  --   --   --   PLT 255  --   --   --   LABPROT 11.2*  --   --   --   INR 0.8  --   --   --   HEPARINUNFRC  --   --   --  >3.60*  CREATININE 1.31*  --   --   --   TROPONINIHS 12 582* 922*  --     Estimated Creatinine Clearance: 28.5 mL/min (A) (by C-G formula based on SCr of 1.31 mg/dL (H)).   Medical History: Past Medical History:  Diagnosis Date   Hypercholesteremia    Hypertension    Myocardial infarction (HCC) 2008   S/P angioplasty with stent     Medications:  Scheduled:   [START ON 12/12/2019] amLODipine  5 mg Oral Daily   cloNIDine  0.1 mg Transdermal Weekly   clopidogrel  75 mg Oral Daily   enoxaparin (LOVENOX) injection  40 mg Subcutaneous Q24H   insulin aspart  0-5 Units Subcutaneous QHS   insulin aspart  0-9 Units Subcutaneous TID WC   isosorbide mononitrate  60 mg Oral Daily   labetalol  10 mg Intravenous Once   rosuvastatin  20 mg Oral Daily   sodium chloride flush  3 mL Intravenous Q12H   torsemide  20 mg Oral QPM     Assessment: 86 y.o. female with history of CAD and stent placements in LAD and RCA and admission for NSTEMI vs myocarditis  last month presenting 12/11/19 with chest pain and suspected NSTEMI with high sens troponin 582. Baseline Hgb and platelets within normal limits. No prior to admission anticoagulants noted. Pharmacy has been consulted for IV heparin dosing for NSTEMI.  8/23 8/23 HL > 3.6 - unsure if level was drawn appropriately due to shift change to be cautious will hold heparin infusion for 1 hours and restart at 500 units/hr.   Goal of Therapy:  Heparin level 0.3-0.7 units/ml Monitor platelets by anticoagulation protocol: Yes   Plan:  Heparin level is supratherapeutic. Will hold heparin infusion for 1 hours and restart at 500 units/hr. Order Anti-xa level in 6 hours from rate change. CBC with AM labs.    Paschal Dopp, PharmD, BCPS Clinical Pharmacist  12/11/2019   7:38 PM

## 2019-12-11 NOTE — ED Triage Notes (Signed)
Pt arrived via EMS from home where she reported sudden onset of chest pain 1 hour ago. Pt diagnosed with COVID on 8/3, was admitted into hospital. Pt last admit to hospital on 8/9 for fall/syncopy. Pt tonight with O2 of 100 RA. 1 inch Nitro paste placed in route to ED by EMS. Pt with 3/10 pain on arrival to ED. Pt denies SOB.

## 2019-12-11 NOTE — Consult Note (Signed)
CARDIOLOGY CONSULT NOTE               Patient ID: [name] MRN: [id number] DOB/AGE: Feb 11, 1931 86 y.o.  Admit date: 12/10/2019 Referring Physician Dairl Ponder Primary Physician [facility] Primary Cardiologist Paraschos Reason for Consultation chest pain, elevated troponin, hypertensive urgency  HPI: 86 year old female referred for evaluation of chest pain, elevated troponin and hypertensive urgency. The patient has a history of coronary artery disease, status post MI in 1985, with coronary stents mid and distal LAD, proximal and mid RCA in 10/2006, PTCA in-stent restenoses mid LAD and mid RCA in 11/2006, and cardiac catheterization in 2014 revealing patent stents with 60% stenosis proximal LAD, essential hypertension, hyperlipidemia, CKD, type II diabetes, and carotid stenosis.  The patient has been admitted 3 times recently for breakthrough COVID pneumonia 10/2019, NSTEMI versus myocarditis with troponin as high as 6,149, and syncope secondary to beta blocker-induced bradycardia on 8/10. Her beta blocker was discontinued, and torsemide was reduced to 20 mg. She remained on clonidine patch and amlodipine 5 mg. The patient reports being in her usual state of health until yesterday evening. She had just finished eating dinner, still sitting at the table, and performing leg lift exercises in her chair that she learned from physical therapy. She suddenly experienced centralized chest pain, ill-defined, without associated radiation, shortness of breath, diaphoresis, or nausea. She laid down on the couch and the chest pain worsened and persisted, so her son called EMS. Upon arrival to the ER, the patient was noted to be hypertensive with blood pressure 203/71 with oxygen saturation 100% on room air. She was given Nitropaste. ECG revealed sinus tachycardia at a rate of 101 bpm with inferolateral ST segment depression. It was noted that her clonidine patch was expired by 1 day. She was given IV labetolol with  improvement in blood pressure. Chest CTA negative for acute intrathoracic abnormality. Admission labs notable for high sensitivity troponin 12 followed by 582, creatinine 1.31, BUN 23, GFR 37. 2D echocardiogram during a previous admission on 11/20/2019 revealed normal left ventricular function with LVEF 60-65% with no regional wall motion abnormalities, mild LVH.  Currently, the patient denies chest pain and reports feeling well. Her systolic blood pressure remains elevated to 177, though she has not received home medications, and remains on clonidine patch.    Review of systems complete and found to be negative unless listed above     Past Medical History:  Diagnosis Date  . Hypercholesteremia   . Hypertension   . Myocardial infarction (HCC) 2008  . S/P angioplasty with stent     Past Surgical History:  Procedure Laterality Date  . ABDOMINAL HYSTERECTOMY    . APPENDECTOMY    . CORONARY ANGIOPLASTY WITH STENT PLACEMENT      (Not in a hospital admission)  Social History   Socioeconomic History  . Marital status: Widowed    Spouse name: Not on file  . Number of children: Not on file  . Years of education: Not on file  . Highest education level: Not on file  Occupational History  . Not on file  Tobacco Use  . Smoking status: Never Smoker  . Smokeless tobacco: Never Used  Vaping Use  . Vaping Use: Never used  Substance and Sexual Activity  . Alcohol use: No  . Drug use: No  . Sexual activity: Not on file  Other Topics Concern  . Not on file  Social History Narrative  . Not on file   Social Determinants of Health  Financial Resource Strain:   . Difficulty of Paying Living Expenses: Not on file  Food Insecurity:   . Worried About Programme researcher, broadcasting/film/video in the Last Year: Not on file  . Ran Out of Food in the Last Year: Not on file  Transportation Needs:   . Lack of Transportation (Medical): Not on file  . Lack of Transportation (Non-Medical): Not on file  Physical  Activity:   . Days of Exercise per Week: Not on file  . Minutes of Exercise per Session: Not on file  Stress:   . Feeling of Stress : Not on file  Social Connections:   . Frequency of Communication with Friends and Family: Not on file  . Frequency of Social Gatherings with Friends and Family: Not on file  . Attends Religious Services: Not on file  . Active Member of Clubs or Organizations: Not on file  . Attends Banker Meetings: Not on file  . Marital Status: Not on file  Intimate Partner Violence:   . Fear of Current or Ex-Partner: Not on file  . Emotionally Abused: Not on file  . Physically Abused: Not on file  . Sexually Abused: Not on file    Family History  Problem Relation Age of Onset  . Breast cancer Maternal Aunt       Review of systems complete and found to be negative unless listed above      PHYSICAL EXAM  General: Well developed, well nourished, in no acute distress, lying supine in bed, appears comfortable HEENT:  Normocephalic and atramatic Neck:  No JVD.  Lungs: Clear bilaterally to auscultation, normal effort of breathing on room air. Heart: HRRR . Normal S1 and S2 without gallops or murmurs.  Abdomen: nondistended Msk:  Back normal, gait not assessed. Normal strength and tone for age. Extremities: No clubbing, cyanosis or edema.   Neuro: Alert and oriented X 3. Psych:  Good affect, responds appropriately  Labs:   Lab Results  Component Value Date   WBC 8.5 12/11/2019   HGB 12.1 12/11/2019   HCT 34.6 (L) 12/11/2019   MCV 96.9 12/11/2019   PLT 255 12/11/2019    Recent Labs  Lab 12/11/19 0001  NA 136  K 3.7  CL 99  CO2 24  BUN 23  CREATININE 1.31*  CALCIUM 9.5  GLUCOSE 172*   Lab Results  Component Value Date   CKTOTAL 75 08/16/2012   CKMB 1.3 08/16/2012   TROPONINI <0.03 08/13/2017    Lab Results  Component Value Date   CHOL 170 11/18/2019   CHOL 176 08/15/2012   Lab Results  Component Value Date   HDL 52  11/18/2019   HDL 33 (L) 08/15/2012   Lab Results  Component Value Date   LDLCALC 100 (H) 11/18/2019   LDLCALC 81 08/15/2012   Lab Results  Component Value Date   TRIG 92 11/18/2019   TRIG 311 (H) 08/15/2012   Lab Results  Component Value Date   CHOLHDL 3.3 11/18/2019   No results found for: LDLDIRECT    Radiology: CT Head Wo Contrast  Result Date: 11/27/2019 CLINICAL DATA:  Neck pain following a fall. EXAM: CT HEAD WITHOUT CONTRAST CT CERVICAL SPINE WITHOUT CONTRAST TECHNIQUE: Multidetector CT imaging of the head and cervical spine was performed following the standard protocol without intravenous contrast. Multiplanar CT image reconstructions of the cervical spine were also generated. COMPARISON:  03/30/2019 FINDINGS: CT HEAD FINDINGS Brain: Stable moderately enlarged ventricles and subarachnoid spaces. Mild-to-moderate patchy white  matter low density in both cerebral hemispheres without significant change. No intracranial hemorrhage, mass lesion or CT evidence of acute infarction. Vascular: No hyperdense vessel or unexpected calcification. Skull: Normal. Negative for fracture or focal lesion. Sinuses/Orbits: Unremarkable. Other: None. CT CERVICAL SPINE FINDINGS Alignment: Stable reversal of the normal cervical lordosis. Stable anterolisthesis at the C2-3 and C4-5 levels. No acute subluxations. Skull base and vertebrae: No acute fracture. No primary bone lesion or focal pathologic process. Soft tissues and spinal canal: No prevertebral fluid or swelling. No visible canal hematoma. Disc levels: Stable multilevel degenerative changes. These include facet degenerative changes. Upper chest: Clear lung apices. Other: Dense bilateral carotid artery calcifications. IMPRESSION: 1. No skull fracture or intracranial hemorrhage. 2. No cervical spine fracture or traumatic subluxation. 3. Stable moderate diffuse cerebral and cerebellar atrophy and mild-to-moderate chronic small vessel white matter ischemic  changes in both cerebral hemispheres. 4. Stable reversal of the normal cervical lordosis and multilevel cervical spine degenerative changes. 5. Dense bilateral carotid artery atheromatous calcifications. Electronically Signed   By: Beckie Salts M.D.   On: 11/27/2019 19:48   CT Angio Chest PE W and/or Wo Contrast  Result Date: 12/11/2019 CLINICAL DATA:  86 year old female with concern for pulmonary embolism. EXAM: CT ANGIOGRAPHY CHEST WITH CONTRAST TECHNIQUE: Multidetector CT imaging of the chest was performed using the standard protocol during bolus administration of intravenous contrast. Multiplanar CT image reconstructions and MIPs were obtained to evaluate the vascular anatomy. CONTRAST:  60mL OMNIPAQUE IOHEXOL 350 MG/ML SOLN COMPARISON:  Chest CT dated 11/17/2019. FINDINGS: Cardiovascular: There is no cardiomegaly or pericardial effusion. There is 3 vessel coronary vascular calcification. Moderate atherosclerotic calcification of the thoracic aorta. No aneurysmal dilatation or dissection. The origins of the great vessels of the aortic arch appear patent. Evaluation of the pulmonary arteries is limited due to respiratory motion artifact. No pulmonary artery embolus identified. Mediastinum/Nodes: There is no hilar or mediastinal adenopathy. The esophagus and the thyroid gland are grossly unremarkable. No mediastinal fluid collection. Lungs/Pleura: There are bibasilar dependent atelectasis. No focal consolidation, pleural effusion, or pneumothorax. The central airways are patent. Upper Abdomen: No acute abnormality. Musculoskeletal: Degenerative changes of the spine. No acute osseous pathology. Review of the MIP images confirms the above findings. IMPRESSION: 1. No acute intrathoracic pathology. No CT evidence of pulmonary embolism. 2. Aortic Atherosclerosis (ICD10-I70.0). Electronically Signed   By: Elgie Collard M.D.   On: 12/11/2019 02:19   CT Angio Chest PE W and/or Wo Contrast  Result Date:  11/17/2019 CLINICAL DATA:  Positive COVID, cough EXAM: CT ANGIOGRAPHY CHEST WITH CONTRAST TECHNIQUE: Multidetector CT imaging of the chest was performed using the standard protocol during bolus administration of intravenous contrast. Multiplanar CT image reconstructions and MIPs were obtained to evaluate the vascular anatomy. CONTRAST:  60mL OMNIPAQUE IOHEXOL 350 MG/ML SOLN COMPARISON:  March 30, 2019 FINDINGS: Cardiovascular: There is a optimal opacification of the pulmonary arteries. There is no central,segmental, or subsegmental filling defects within the pulmonary arteries. There is mild cardiomegaly. Coronary artery calcifications are seen. No pericardial effusion or thickening. No evidence right heart strain. There is a patent four-vessel arch with a common origin of the left vertebral artery off the arch. Scattered aortic atherosclerosis is noted. Mediastinum/Nodes: No hilar, mediastinal, or axillary adenopathy. Thyroid gland, trachea, and esophagus demonstrate no significant findings. Lungs/Pleura: There is a small focal patchy opacity seen at the left lung base. The right lung is clear. No pleural effusion is seen. Upper Abdomen: No acute abnormalities present in the visualized portions  of the upper abdomen. Musculoskeletal: No chest wall abnormality. No acute or significant osseous findings. Review of the MIP images confirms the above findings. IMPRESSION: No central, segmental, or subsegmental pulmonary embolism. Small focal patchy area at the left lung base which could be due to atelectasis and/or early infectious etiology. Aortic Atherosclerosis (ICD10-I70.0). Electronically Signed   By: Jonna Clark M.D.   On: 11/17/2019 21:40   CT Cervical Spine Wo Contrast  Result Date: 11/27/2019 CLINICAL DATA:  Neck pain following a fall. EXAM: CT HEAD WITHOUT CONTRAST CT CERVICAL SPINE WITHOUT CONTRAST TECHNIQUE: Multidetector CT imaging of the head and cervical spine was performed following the standard  protocol without intravenous contrast. Multiplanar CT image reconstructions of the cervical spine were also generated. COMPARISON:  03/30/2019 FINDINGS: CT HEAD FINDINGS Brain: Stable moderately enlarged ventricles and subarachnoid spaces. Mild-to-moderate patchy white matter low density in both cerebral hemispheres without significant change. No intracranial hemorrhage, mass lesion or CT evidence of acute infarction. Vascular: No hyperdense vessel or unexpected calcification. Skull: Normal. Negative for fracture or focal lesion. Sinuses/Orbits: Unremarkable. Other: None. CT CERVICAL SPINE FINDINGS Alignment: Stable reversal of the normal cervical lordosis. Stable anterolisthesis at the C2-3 and C4-5 levels. No acute subluxations. Skull base and vertebrae: No acute fracture. No primary bone lesion or focal pathologic process. Soft tissues and spinal canal: No prevertebral fluid or swelling. No visible canal hematoma. Disc levels: Stable multilevel degenerative changes. These include facet degenerative changes. Upper chest: Clear lung apices. Other: Dense bilateral carotid artery calcifications. IMPRESSION: 1. No skull fracture or intracranial hemorrhage. 2. No cervical spine fracture or traumatic subluxation. 3. Stable moderate diffuse cerebral and cerebellar atrophy and mild-to-moderate chronic small vessel white matter ischemic changes in both cerebral hemispheres. 4. Stable reversal of the normal cervical lordosis and multilevel cervical spine degenerative changes. 5. Dense bilateral carotid artery atheromatous calcifications. Electronically Signed   By: Beckie Salts M.D.   On: 11/27/2019 19:48   DG Chest Portable 1 View  Result Date: 12/11/2019 CLINICAL DATA:  86 year old female with chest pain. EXAM: PORTABLE CHEST 1 VIEW COMPARISON:  Chest radiograph dated 11/17/2019 and CT dated 11/17/2019 FINDINGS: No focal consolidation, pleural effusion, pneumothorax. The cardiac silhouette is within limits. Coronary  vascular calcification or stent. Atherosclerotic calcification of the aorta. No acute osseous pathology. IMPRESSION: No active disease. Electronically Signed   By: Elgie Collard M.D.   On: 12/11/2019 00:32   DG Chest Portable 1 View  Result Date: 11/17/2019 CLINICAL DATA:  Cough, chest pain, COVID positive. EXAM: PORTABLE CHEST 1 VIEW COMPARISON:  03/15/2019 FINDINGS: The cardiomediastinal contours are normal. Aortic atherosclerosis. Minimal subsegmental opacities in the lung bases. Pulmonary vasculature is normal. No consolidation, pleural effusion, or pneumothorax. No acute osseous abnormalities are seen. IMPRESSION: Minimal subsegmental bibasilar opacities, may be atelectasis or atypical viral pneumonia in the setting of COVID. Aortic Atherosclerosis (ICD10-I70.0). Electronically Signed   By: Narda Rutherford M.D.   On: 11/17/2019 20:28   ECHOCARDIOGRAM COMPLETE  Result Date: 11/20/2019    ECHOCARDIOGRAM REPORT   Patient Name:   [name] Date of Exam: 11/20/2019 Medical Rec #:  [id number]     Height:       62.0 in Accession #:    [id number]    Weight:       136.7 lb Date of Birth:  [birth year]     BSA:          1.626 m Patient Age:    88 years      BP:  176/59 mmHg Patient Gender: F             HR:           62 bpm. Exam Location:  [facility] Procedure: Cardiac Doppler and Color Doppler Indications:     Abnormal ECG 794.31  History:         Patient has no prior history of Echocardiogram examinations.                  Previous Myocardial Infarction; Risk Factors:Hypertension. S/P                  angioplast with stent.  Sonographer:     Cristela Blue RDCS (AE) Referring Phys:  [id number] Gardiner Ramus MATHEWS Diagnosing Phys: Harold Hedge MD  Sonographer Comments: No apical window. IMPRESSIONS  1. Left ventricular ejection fraction, by estimation, is 60 to 65%. Left ventricular ejection fraction by PLAX is 69 %. The left ventricle has normal function. The left ventricle has no regional wall motion  abnormalities. There is mild left ventricular hypertrophy. Left ventricular diastolic parameters are consistent with Grade I diastolic dysfunction (impaired relaxation).  2. Right ventricular systolic function is normal. The right ventricular size is mildly enlarged.  3. The mitral valve was not well visualized. Trivial mitral valve regurgitation.  4. The aortic valve is grossly normal. Aortic valve regurgitation is not visualized. FINDINGS  Left Ventricle: Left ventricular ejection fraction, by estimation, is 60 to 65%. Left ventricular ejection fraction by PLAX is 69 %. The left ventricle has normal function. The left ventricle has no regional wall motion abnormalities. The left ventricular internal cavity size was normal in size. There is mild left ventricular hypertrophy. Left ventricular diastolic parameters are consistent with Grade I diastolic dysfunction (impaired relaxation). Right Ventricle: The right ventricular size is mildly enlarged. No increase in right ventricular wall thickness. Right ventricular systolic function is normal. Left Atrium: Left atrial size was normal in size. Right Atrium: Right atrial size was normal in size. Pericardium: There is no evidence of pericardial effusion. Mitral Valve: The mitral valve was not well visualized. Trivial mitral valve regurgitation. Tricuspid Valve: The tricuspid valve is not well visualized. Tricuspid valve regurgitation is mild. Aortic Valve: The aortic valve is grossly normal. Aortic valve regurgitation is not visualized. Pulmonic Valve: The pulmonic valve was not well visualized. Pulmonic valve regurgitation is mild. Aorta: The aortic root was not well visualized. IAS/Shunts: The interatrial septum was not assessed.  LEFT VENTRICLE PLAX 2D LV EF:         Left ventricular ejection fraction by PLAX is 69 %. LVIDd:         3.35 cm LVIDs:         2.09 cm LV PW:         1.43 cm LV IVS:        1.27 cm LVOT diam:     2.00 cm LVOT Area:     3.14 cm  LEFT ATRIUM          Index LA diam:    3.00 cm 1.84 cm/m                        PULMONIC VALVE AORTA                 PV Vmax:        0.72 m/s Ao Root diam: 2.80 cm PV Peak grad:   2.1 mmHg  RVOT Peak grad: 4 mmHg   SHUNTS Systemic Diam: 2.00 cm Harold Hedge MD Electronically signed by Harold Hedge MD Signature Date/Time: 11/20/2019/1:02:21 PM    Final     EKG: sinus rhythm, lateral ST depression   ASSESSMENT AND PLAN:  1. Chest pain, likely due to hypertensive urgency with recent medication changes/discontinuations made and clonidine patch expired, versus ACS. ECG with inferolateral ST depression. Troponin 12 and 582. Denies recurrent chest pain as blood pressure has improved. 2. Coronary artery disease, status post MI in 1980s with multiple stents, with most recent cardiac catheterization in 2014 revealing patent stents with  60% stenosis proximal LAD.  3. Hypertension, elevated upon arrival to 203 systolic. Improved to 177 systolic.  Recommendations: 1. Resume amlodipine 5 mg, torsemide 20 mg, and isosorbide mononitrate 60 mg daily 2. Continue Crestor 20 mg daily 3. Trend troponin 4. Repeat ECG 5. Defer repeat echocardiogram at this time.  6. Further recommendations pending patient's initial course.  Signed: Leanora Ivanoff PA-C 12/11/2019, 9:35 AM  Discussed with Dr. Darrold Junker and plan made in collaboration with him.

## 2019-12-11 NOTE — ED Notes (Signed)
Pharm called. Will hold heparin one hour then restart at lower dose. Pharm will put order in.
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
## 2019-12-12 ENCOUNTER — Other Ambulatory Visit: Payer: Self-pay

## 2019-12-12 ENCOUNTER — Encounter
Admission: EM | Disposition: A | Discharge status: Home Health | Payer: Self-pay | Source: Home / Self Care | Attending: Internal Medicine

## 2019-12-12 HISTORY — PX: LEFT HEART CATH AND CORONARY ANGIOGRAPHY: CATH118249

## 2019-12-12 HISTORY — PX: CORONARY STENT INTERVENTION: CATH118234

## 2019-12-12 LAB — GLUCOSE, CAPILLARY
Glucose-Capillary: 114 mg/dL — ABNORMAL HIGH (ref 70–99)
Glucose-Capillary: 113 mg/dL — ABNORMAL HIGH (ref 70–99)
Glucose-Capillary: 163 mg/dL — ABNORMAL HIGH (ref 70–99)
Glucose-Capillary: 139 mg/dL — ABNORMAL HIGH (ref 70–99)

## 2019-12-12 LAB — CBC
HCT: 34.4 % — ABNORMAL LOW (ref 36.0–46.0)
Hemoglobin: 11.6 g/dL — ABNORMAL LOW (ref 12.0–15.0)
MCH: 33.5 pg (ref 26.0–34.0)
MCHC: 33.7 g/dL (ref 30.0–36.0)
MCV: 99.4 fL (ref 80.0–100.0)
Platelets: 245 10*3/uL (ref 150–400)
RBC: 3.46 MIL/uL — ABNORMAL LOW (ref 3.87–5.11)
RDW: 14 % (ref 11.5–15.5)
WBC: 8.5 10*3/uL (ref 4.0–10.5)
nRBC: 0 % (ref 0.0–0.2)

## 2019-12-12 LAB — BASIC METABOLIC PANEL
Anion gap: 9 (ref 5–15)
BUN: 22 mg/dL (ref 8–23)
CO2: 26 mmol/L (ref 22–32)
Calcium: 9.2 mg/dL (ref 8.9–10.3)
Chloride: 105 mmol/L (ref 98–111)
Creatinine, Ser: 1.03 mg/dL — ABNORMAL HIGH (ref 0.44–1.00)
GFR calc Af Amer: 57 mL/min — ABNORMAL LOW (ref >60)
GFR calc non Af Amer: 49 mL/min — ABNORMAL LOW (ref >60)
Glucose, Bld: 127 mg/dL — ABNORMAL HIGH (ref 70–99)
Potassium: 4.3 mmol/L (ref 3.5–5.1)
Sodium: 140 mmol/L (ref 135–145)

## 2019-12-12 LAB — POCT ACTIVATED CLOTTING TIME: Activated Clotting Time: 340 seconds

## 2019-12-12 LAB — HEPARIN LEVEL (UNFRACTIONATED): Heparin Unfractionated: >3.6 IU/mL — ABNORMAL HIGH (ref 0.30–0.70)

## 2019-12-12 LAB — TROPONIN I (HIGH SENSITIVITY): Troponin I (High Sensitivity): 396 ng/L (ref <18)

## 2019-12-12 SURGERY — LEFT HEART CATH AND CORONARY ANGIOGRAPHY
Anesthesia: Moderate Sedation

## 2019-12-12 MED ORDER — ONDANSETRON HCL 4 MG/2ML IJ SOLN: 4.0000 mg | Freq: Four times a day (QID) | INTRAMUSCULAR | Status: DC | PRN

## 2019-12-12 MED ORDER — ASPIRIN 81 MG PO CHEW
CHEWABLE_TABLET | ORAL | Status: AC
  Filled 2019-12-12: qty 1

## 2019-12-12 MED ORDER — BIVALIRUDIN BOLUS VIA INFUSION - CUPID
INTRAVENOUS | Status: DC | PRN
  Administered 2019-12-12: 53.4 mg via INTRAVENOUS

## 2019-12-12 MED ORDER — HEPARIN (PORCINE) 25000 UT/250ML-% IV SOLN
200.0000 [IU]/h | INTRAVENOUS | Status: DC
  Administered 2019-12-12: 200 [IU]/h via INTRAVENOUS

## 2019-12-12 MED ORDER — ASPIRIN 81 MG PO CHEW
81.0000 mg | CHEWABLE_TABLET | Freq: Once | ORAL | Status: AC
  Administered 2019-12-12: 81 mg via ORAL

## 2019-12-12 MED ORDER — MIDAZOLAM HCL 2 MG/2ML IJ SOLN
INTRAMUSCULAR | Status: AC
  Filled 2019-12-12: qty 2

## 2019-12-12 MED ORDER — SODIUM CHLORIDE 0.9 % WEIGHT BASED INFUSION: 1.0000 mL/kg/h | INTRAVENOUS | Status: AC

## 2019-12-12 MED ORDER — LABETALOL HCL 5 MG/ML IV SOLN: 10.0000 mg | INTRAVENOUS | Status: AC | PRN

## 2019-12-12 MED ORDER — FENTANYL CITRATE (PF) 100 MCG/2ML IJ SOLN
INTRAMUSCULAR | Status: AC
  Filled 2019-12-12: qty 2

## 2019-12-12 MED ORDER — HEPARIN (PORCINE) IN NACL 1000-0.9 UT/500ML-% IV SOLN
INTRAVENOUS | Status: DC | PRN
  Administered 2019-12-12: 500 mL

## 2019-12-12 MED ORDER — ASPIRIN 81 MG PO CHEW
CHEWABLE_TABLET | ORAL | Status: AC
  Filled 2019-12-12: qty 3

## 2019-12-12 MED ORDER — HYDRALAZINE HCL 20 MG/ML IJ SOLN: 10.0000 mg | INTRAMUSCULAR | Status: AC | PRN

## 2019-12-12 MED ORDER — HEPARIN (PORCINE) IN NACL 1000-0.9 UT/500ML-% IV SOLN
INTRAVENOUS | Status: AC
  Filled 2019-12-12: qty 1000

## 2019-12-12 MED ORDER — SODIUM CHLORIDE 0.9 % IV SOLN: 250.0000 mL | INTRAVENOUS | Status: DC | PRN

## 2019-12-12 MED ORDER — SODIUM CHLORIDE 0.9% FLUSH
3.0000 mL | Freq: Two times a day (BID) | INTRAVENOUS | Status: DC
  Administered 2019-12-13: 3 mL via INTRAVENOUS

## 2019-12-12 MED ORDER — ACETAMINOPHEN 325 MG PO TABS
650.0000 mg | ORAL_TABLET | ORAL | Status: DC | PRN
  Administered 2019-12-13: 650 mg via ORAL
  Filled 2019-12-12: qty 2

## 2019-12-12 MED ORDER — FENTANYL CITRATE (PF) 100 MCG/2ML IJ SOLN
INTRAMUSCULAR | Status: DC | PRN
Start: 2019-12-12 — End: 2019-12-12
  Administered 2019-12-12: 50 ug via INTRAVENOUS

## 2019-12-12 MED ORDER — MIDAZOLAM HCL 2 MG/2ML IJ SOLN
INTRAMUSCULAR | Status: DC | PRN
  Administered 2019-12-12: 1 mg via INTRAVENOUS

## 2019-12-12 MED ORDER — ASPIRIN 81 MG PO CHEW
81.0000 mg | CHEWABLE_TABLET | Freq: Every day | ORAL | Status: DC
  Administered 2019-12-13: 81 mg via ORAL
  Filled 2019-12-12: qty 1

## 2019-12-12 MED ORDER — NITROGLYCERIN 1 MG/10 ML FOR IR/CATH LAB
INTRA_ARTERIAL | Status: AC
  Filled 2019-12-12: qty 10

## 2019-12-12 MED ORDER — SODIUM CHLORIDE 0.9 % IV SOLN
INTRAVENOUS | Status: AC | PRN
  Administered 2019-12-12: 1.75 mg/kg/h via INTRAVENOUS

## 2019-12-12 MED ORDER — NITROGLYCERIN 1 MG/10 ML FOR IR/CATH LAB
INTRA_ARTERIAL | Status: DC | PRN
  Administered 2019-12-12: 200 ug via INTRACORONARY

## 2019-12-12 MED ORDER — CLOPIDOGREL BISULFATE 75 MG PO TABS
ORAL_TABLET | ORAL | Status: AC
  Filled 2019-12-12: qty 3

## 2019-12-12 MED ORDER — BIVALIRUDIN TRIFLUOROACETATE 250 MG IV SOLR
INTRAVENOUS | Status: AC
  Filled 2019-12-12: qty 250

## 2019-12-12 MED ORDER — CLOPIDOGREL BISULFATE 75 MG PO TABS
75.0000 mg | ORAL_TABLET | Freq: Every day | ORAL | Status: DC
  Administered 2019-12-13: 75 mg via ORAL
  Filled 2019-12-12: qty 1

## 2019-12-12 MED ORDER — SODIUM CHLORIDE 0.9% FLUSH: 3.0000 mL | INTRAVENOUS | Status: DC | PRN

## 2019-12-12 MED ORDER — CLOPIDOGREL BISULFATE 75 MG PO TABS
ORAL_TABLET | ORAL | Status: DC | PRN
  Administered 2019-12-12: 225 mg

## 2019-12-12 MED ORDER — ASPIRIN 81 MG PO CHEW
CHEWABLE_TABLET | ORAL | Status: DC | PRN
  Administered 2019-12-12: 224 mg via ORAL

## 2019-12-12 SURGICAL SUPPLY — 16 items
BALLN TREK RX 2.25X12 (BALLOONS) ×3
BALLOON TREK RX 2.25X12 (BALLOONS) ×1 IMPLANT
CATH INFINITI 5FR ANG PIGTAIL (CATHETERS) ×3 IMPLANT
CATH INFINITI 5FR JL4 (CATHETERS) ×3 IMPLANT
CATH INFINITI JR4 5F (CATHETERS) ×3 IMPLANT
CATH VISTA GUIDE 6FR JR4 (CATHETERS) ×3 IMPLANT
DEVICE CLOSURE MYNXGRIP 6/7F (Vascular Products) ×3 IMPLANT
DEVICE INFLAT 30 PLUS (MISCELLANEOUS) ×3 IMPLANT
KIT MANI 3VAL PERCEP (MISCELLANEOUS) ×3 IMPLANT
NEEDLE PERC 18GX7CM (NEEDLE) ×3 IMPLANT
PACK CARDIAC CATH (CUSTOM PROCEDURE TRAY) ×3 IMPLANT
SHEATH AVANTI 5FR X 11CM (SHEATH) ×3 IMPLANT
SHEATH AVANTI 6FR X 11CM (SHEATH) ×3 IMPLANT
STENT RESOLUTE ONYX 2.25X15 (Permanent Stent) ×3 IMPLANT
WIRE ASAHI PROWATER 180CM (WIRE) ×3 IMPLANT
WIRE GUIDERIGHT .035X150 (WIRE) ×3 IMPLANT

## 2019-12-12 NOTE — Progress Notes (Signed)
[facility]    SUBJECTIVE: The patient reports feeling well this morning. She reports having some mild 1-2/10 centralized chest tightness this morning that has resolved. She denies shortness of breath or palpitations. We discussed again the planned cardiac catheterization this morning, and she had no further questions.   Vitals:   12/12/19 0500 12/12/19 0600 12/12/19 0630 12/12/19 0730  BP: (!) 136/41 (!) 143/51 (!) 136/47 (!) 140/47  Pulse: 63 72 70 66  Resp: 18 14 (!) 23 18  Temp:      TempSrc:      SpO2: 97% 96% 95% 98%  Weight:      Height:        No intake or output data in the 24 hours ending 12/12/19 0830    PHYSICAL EXAM  General: Well developed, well nourished, in no acute distress, lying supine in bed, appears comfortable HEENT:  Normocephalic and atramatic Neck:   No JVD.  Lungs: Clear bilaterally to auscultation, normal effort of breathing on room air. Heart: HRRR . Normal S1 and S2 without gallops or murmurs.  Abdomen: nondistended Msk:  Back normal, gait not assessed. Normal strength and tone for age. Extremities: No clubbing, cyanosis or edema.   Neuro: Alert and oriented X 3. Psych:  Good affect, responds appropriately   LABS: Basic Metabolic Panel: Recent Labs    12/11/19 0001 12/12/19 0308  NA 136 140  K 3.7 4.3  CL 99 105  CO2 24 26  GLUCOSE 172* 127*  BUN 23 22  CREATININE 1.31* 1.03*  CALCIUM 9.5 9.2   Liver Function Tests: No results for input(s): AST, ALT, ALKPHOS, BILITOT, PROT, ALBUMIN in the last 72 hours. No results for input(s): LIPASE, AMYLASE in the last 72 hours. CBC: Recent Labs    12/11/19 0001 12/12/19 0308  WBC 8.5 8.5  HGB 12.1 11.6*  HCT 34.6* 34.4*  MCV 96.9 99.4  PLT 255 245   Cardiac Enzymes: No results for input(s): CKTOTAL, CKMB, CKMBINDEX, TROPONINI in the last 72 hours. BNP: Invalid input(s): POCBNP D-Dimer: No results for input(s): DDIMER in the last 72 hours. Hemoglobin A1C: No results for  input(s): HGBA1C in the last 72 hours. Fasting Lipid Panel: No results for input(s): CHOL, HDL, LDLCALC, TRIG, CHOLHDL, LDLDIRECT in the last 72 hours. Thyroid Function Tests: No results for input(s): TSH, T4TOTAL, T3FREE, THYROIDAB in the last 72 hours.  Invalid input(s): FREET3 Anemia Panel: No results for input(s): VITAMINB12, FOLATE, FERRITIN, TIBC, IRON, RETICCTPCT in the last 72 hours.  CT Angio Chest PE W and/or Wo Contrast  Result Date: 12/11/2019 CLINICAL DATA:  86 year old female with concern for pulmonary embolism. EXAM: CT ANGIOGRAPHY CHEST WITH CONTRAST TECHNIQUE: Multidetector CT imaging of the chest was performed using the standard protocol during bolus administration of intravenous contrast. Multiplanar CT image reconstructions and MIPs were obtained to evaluate the vascular anatomy. CONTRAST:  48mL OMNIPAQUE IOHEXOL 350 MG/ML SOLN COMPARISON:  Chest CT dated 11/17/2019. FINDINGS: Cardiovascular: There is no cardiomegaly or pericardial effusion. There is 3 vessel coronary vascular calcification. Moderate atherosclerotic calcification of the thoracic aorta. No aneurysmal dilatation or dissection. The origins of the great vessels of the aortic arch appear patent. Evaluation of the pulmonary arteries is limited due to respiratory motion artifact. No pulmonary artery embolus identified. Mediastinum/Nodes: There is no hilar or mediastinal adenopathy. The esophagus and the thyroid gland are grossly unremarkable. No mediastinal fluid collection. Lungs/Pleura: There are bibasilar dependent atelectasis. No focal consolidation, pleural effusion, or pneumothorax. The central airways are patent.  Upper Abdomen: No acute abnormality. Musculoskeletal: Degenerative changes of the spine. No acute osseous pathology. Review of the MIP images confirms the above findings. IMPRESSION: 1. No acute intrathoracic pathology. No CT evidence of pulmonary embolism. 2. Aortic Atherosclerosis (ICD10-I70.0).  Electronically Signed   By: Elgie Collard M.D.   On: 12/11/2019 02:19   DG Chest Portable 1 View  Result Date: 12/11/2019 CLINICAL DATA:  86 year old female with chest pain. EXAM: PORTABLE CHEST 1 VIEW COMPARISON:  Chest radiograph dated 11/17/2019 and CT dated 11/17/2019 FINDINGS: No focal consolidation, pleural effusion, pneumothorax. The cardiac silhouette is within limits. Coronary vascular calcification or stent. Atherosclerotic calcification of the aorta. No acute osseous pathology. IMPRESSION: No active disease. Electronically Signed   By: Elgie Collard M.D.   On: 12/11/2019 00:32    TELEMETRY: sinus rhythm  ASSESSMENT AND PLAN:  Principal Problem:   Hypertensive urgency Active Problems:   Chest pain   CAD (coronary artery disease)   Type 2 diabetes mellitus (HCC)   CKD (chronic kidney disease) stage 3, GFR 30-59 ml/min   Hypertensive emergency    1. Chest pain, in the setting of hypertensive urgency, with initial ECG revealing inferolateral ST depression and repeat ECG revealing resolved ST depression with significant q wave in lead III, not in II and aVF. Troponin 12-> 582-> 922-> 396.The patient denies significant recurrent chest pain. She remains on heparin drip. Plan for cardiac catheterization with potential PCI today. 2. Hypertensive urgency, resolved, blood pressure better controlled this morning 3. Coronary artery disease, status post MI in 1980s with multiple stents, with most recent cardiac catheterization in 2014 revealing patent stents with  60% stenosis proximal LAD.   Recommendations: 1. Proceed with cardiac catheterization today around 12:30 as planned. NPO since midnight 2. Defer beta blocker with history of bradycardia 3. Continue Crestor, amlodipine, clonidine patch, aspirin, and Plavix 4. Further recommendations pending results of cardiac catheterization  Leanora Ivanoff, PA-C 12/12/2019 8:30 AM  Discussed with Dr. Darrold Junker; plan made in collaboration  with him.

## 2019-12-12 NOTE — ED Notes (Signed)
Pt site where had been previously stuck during day at lac is bleeding at this time, this nurse controls blood and placed dressing over site to assist in stopping bleeding

## 2019-12-12 NOTE — Progress Notes (Signed)
ANTICOAGULATION CONSULT NOTE   Pharmacy Consult for Heparin Indication: chest pain/ACS  Allergies  Allergen Reactions  . Butenafine Swelling    Tongue swelled and turned red  . Levofloxacin Swelling and Other (See Comments)    Other reaction(s): Other (See Comments) Tongue swelled and turned red  . Limonene Other (See Comments)    Other reaction(s): Other (See Comments) Tongue swelled and turned red Tongue swelled and turned red   . Terbinafine And Related Other (See Comments)    Tongue swelled and turned red    Patient Measurements: Heparin Dosing Weight: 60 kg    Vital Signs: Temp: 98.3 F (36.8 C) (08/23 1943) BP: 128/44 (08/24 0400) Pulse Rate: 63 (08/24 0400)  Labs: Recent Labs    12/11/19 0001 12/11/19 0625 12/11/19 1145 12/11/19 1800 12/12/19 0308  HGB 12.1  --   --   --  11.6*  HCT 34.6*  --   --   --  34.4*  PLT 255  --   --   --  245  LABPROT 11.2*  --   --   --   --   INR 0.8  --   --   --   --   HEPARINUNFRC  --   --   --  >3.60* >3.60*  CREATININE 1.31*  --   --   --  1.03*  TROPONINIHS 12 582* 922*  --   --     Estimated Creatinine Clearance: 36.2 mL/min (A) (by C-G formula based on SCr of 1.03 mg/dL (H)).   Medical History: Past Medical History:  Diagnosis Date  . Hypercholesteremia   . Hypertension   . Myocardial infarction (HCC) 2008  . S/P angioplasty with stent     Medications:  Scheduled:  . amLODipine  5 mg Oral Daily  . cloNIDine  0.1 mg Transdermal Weekly  . clopidogrel  75 mg Oral Daily  . insulin aspart  0-5 Units Subcutaneous QHS  . insulin aspart  0-9 Units Subcutaneous TID WC  . isosorbide mononitrate  60 mg Oral Daily  . labetalol  10 mg Intravenous Once  . rosuvastatin  20 mg Oral Daily  . sodium chloride flush  3 mL Intravenous Q12H  . torsemide  20 mg Oral QPM     Assessment: 86 y.o. female with history of CAD and stent placements in LAD and RCA and admission for NSTEMI vs myocarditis last month presenting  12/11/19 with chest pain and suspected NSTEMI with high sens troponin 582. Baseline Hgb and platelets within normal limits. No prior to admission anticoagulants noted. Pharmacy has been consulted for IV heparin dosing for NSTEMI.  8/23 8/23 HL > 3.6 - unsure if level was drawn appropriately due to shift change to be cautious will hold heparin infusion for 1 hours and restart at 500 units/hr.   Goal of Therapy:  Heparin level 0.3-0.7 units/ml Monitor platelets by anticoagulation protocol: Yes   Plan:  08/24 @ 0300 HL > 3.60 x 2 supratherapeutic. Will hold heparin drip and restart at 0630 at a rate of 200 units/hr and will recheck HL at 1430, Hgb trending down. Per RN patient had some bleeding issues at IV site that has been controlled, will continue to monitor.  Thomasene Ripple, PharmD, BCPS Clinical Pharmacist  12/12/2019   4:26 AM

## 2019-12-12 NOTE — Progress Notes (Signed)
PROGRESS NOTE    [name]    Code Status: Full Code  JME:[id number] DOB: 07-28-1930 DOA: 12/10/2019 LOS: 1 days  PCP: Danella Penton, MD CC:  Chief Complaint  Patient presents with  . Chest Pain       Hospital Summary   [name] is a 86 y.o. female with a history of hypertension, diabetes, significant cardiac history with CAD s/p multiple stents,  multiple recent ED visits for different episodes including syncope secondary to beta-blocker induced bradycardia at which time beta-blocker was discontinued and NSTEMI versus myocarditis in July 2021, multiple medication adjustments recently who presented with typical chest pain and was admitted for hypertensive urgency vs. emergency.  ED Course:On arrival blood pressure was 203/71 with heart rate of 105. Afebrile with O2 sat 100% on room air. Troponin 12,creatinine 1.31. CTA chest with no evidence of PE and no acute intrathoracic pathology. EKG showed sinus tachycardia with nonspecific ST-T wave changes. Nitropaste placed given labetalol for suspicion of hypertensive emergency causing chest pain.   8/23: Patient was given a heparin bolus and drip and cardiology was consulted due to abnormal serial EKGs and Troponin 12->582->922 - NSTEMI 8/24: Cardiac catheterization -> Successful PCI with DES to distal RCA for 95% stenosis    A & P   Principal Problem:   Hypertensive urgency Active Problems:   Chest pain   CAD (coronary artery disease)   Type 2 diabetes mellitus (HCC)   CKD (chronic kidney disease) stage 3, GFR 30-59 ml/min   Hypertensive emergency    1. Typical chest pain  NSTEMI s/p Cath/PCI with DES to RCA, POD 0 a. Intermittent chest pain overnight prior to cath b. Initial EKG with ST depressions in inferolateral leads c. Repeat EKG with resolved ST depressions with the exception of significant Q wave in III and T wave inversion in III on personal read d. Troponin 12-> 582-> 922->396 e. Recent echo  unremarkable f. heparin g. Plan per Cardio  2. Hypertensive emergency leading to NSTEMI a. BP on presentation 203/71, currently stable b. Beta blocker recently discontinued due to bradycardia induced syncope c. Continue current meds for now  3. Type 2 diabetes stable  4. CKD 3a stable  5. CAD a. Per cardio: status post MI in 1980s with multiple stents, with most recent cardiac catheterization in 2014 revealing patent stents with 60% stenosis proximal LAD.  b. Plan as above  DVT prophylaxis: heparin   Family Communication: Patient updated at bedside, cardio updated family  Disposition Plan: Pending cardio sign off and PT eval. Likely DC tomorrow but will need BP med rec prior to DC Status is: Inpatient  Remains inpatient appropriate because:IV treatments appropriate due to intensity of illness or inability to take PO   Dispo: The patient is from: Home              Anticipated d/c is to: Home              Anticipated d/c date is: 1 day              Patient currently is not medically stable to d/c.          Pressure injury documentation    None  Consultants  Cardiology   Procedures  8/24 Cardiac cath  Antibiotics   Anti-infectives (From admission, onward)   None        Subjective   Patient doing well. Had intermittent chest pain overnight, currently chest pain free. No overnight events.  Otherwise ROS negative  Objective   Vitals:   12/12/19 1500 12/12/19 1525 12/12/19 1538 12/12/19 1622  BP: (!) 117/44 (!) 132/56 (!) 134/57 (!) 122/46  Pulse: 78 81 85 71  Resp: 20 20 18 16   Temp:   97.6 F (36.4 C)   TempSrc:   Oral   SpO2: 96% 97% 99% 99%  Weight:      Height:        Intake/Output Summary (Last 24 hours) at 12/12/2019 1638 Last data filed at 12/12/2019 1500 Gross per 24 hour  Intake 360 ml  Output --  Net 360 ml   Filed Weights   12/11/19 1100  Weight: 71.2 kg    Examination:  Physical Exam Vitals and nursing note reviewed.   Constitutional:      Appearance: Normal appearance.  HENT:     Head: Normocephalic and atraumatic.  Eyes:     Conjunctiva/sclera: Conjunctivae normal.  Cardiovascular:     Rate and Rhythm: Normal rate and regular rhythm.  Pulmonary:     Effort: Pulmonary effort is normal.     Breath sounds: Normal breath sounds.  Abdominal:     General: Abdomen is flat.     Palpations: Abdomen is soft.  Musculoskeletal:        General: No swelling or tenderness.  Skin:    Coloration: Skin is not jaundiced or pale.  Neurological:     Mental Status: She is alert. Mental status is at baseline.  Psychiatric:        Mood and Affect: Mood normal.        Behavior: Behavior normal.     Data Reviewed: I have personally reviewed following labs and imaging studies  CBC: Recent Labs  Lab 12/11/19 0001 12/12/19 0308  WBC 8.5 8.5  HGB 12.1 11.6*  HCT 34.6* 34.4*  MCV 96.9 99.4  PLT 255 245   Basic Metabolic Panel: Recent Labs  Lab 12/11/19 0001 12/12/19 0308  NA 136 140  K 3.7 4.3  CL 99 105  CO2 24 26  GLUCOSE 172* 127*  BUN 23 22  CREATININE 1.31* 1.03*  CALCIUM 9.5 9.2   GFR: Estimated Creatinine Clearance: 36.2 mL/min (A) (by C-G formula based on SCr of 1.03 mg/dL (H)). Liver Function Tests: No results for input(s): AST, ALT, ALKPHOS, BILITOT, PROT, ALBUMIN in the last 168 hours. No results for input(s): LIPASE, AMYLASE in the last 168 hours. No results for input(s): AMMONIA in the last 168 hours. Coagulation Profile: Recent Labs  Lab 12/11/19 0001  INR 0.8   Cardiac Enzymes: No results for input(s): CKTOTAL, CKMB, CKMBINDEX, TROPONINI in the last 168 hours. BNP (last 3 results) No results for input(s): PROBNP in the last 8760 hours. HbA1C: No results for input(s): HGBA1C in the last 72 hours. CBG: Recent Labs  Lab 12/11/19 1245 12/11/19 1656 12/11/19 2133 12/12/19 0737 12/12/19 1237  GLUCAP 101* 124* 149* 114* 113*   Lipid Profile: No results for input(s):  CHOL, HDL, LDLCALC, TRIG, CHOLHDL, LDLDIRECT in the last 72 hours. Thyroid Function Tests: No results for input(s): TSH, T4TOTAL, FREET4, T3FREE, THYROIDAB in the last 72 hours. Anemia Panel: No results for input(s): VITAMINB12, FOLATE, FERRITIN, TIBC, IRON, RETICCTPCT in the last 72 hours. Sepsis Labs: No results for input(s): PROCALCITON, LATICACIDVEN in the last 168 hours.  Recent Results (from the past 240 hour(s))  SARS Coronavirus 2 by RT PCR (hospital order, performed in [facility] hospital lab) Nasopharyngeal Nasopharyngeal Swab     Status: None  Collection Time: 12/11/19 11:44 AM   Specimen: Nasopharyngeal Swab  Result Value Ref Range Status   SARS Coronavirus 2 NEGATIVE NEGATIVE Final    Comment: (NOTE) SARS-CoV-2 target nucleic acids are NOT DETECTED.  The SARS-CoV-2 RNA is generally detectable in upper and lower respiratory specimens during the acute phase of infection. The lowest concentration of SARS-CoV-2 viral copies this assay can detect is 250 copies / mL. A negative result does not preclude SARS-CoV-2 infection and should not be used as the sole basis for treatment or other patient management decisions.  A negative result may occur with improper specimen collection / handling, submission of specimen other than nasopharyngeal swab, presence of viral mutation(s) within the areas targeted by this assay, and inadequate number of viral copies (<250 copies / mL). A negative result must be combined with clinical observations, patient history, and epidemiological information.  Fact Sheet for Patients:   BoilerBrush.com.cy  Fact Sheet for Healthcare Providers: https://pope.com/  This test is not yet approved or  cleared by the [place] FDA and has been authorized for detection and/or diagnosis of SARS-CoV-2 by FDA under an Emergency Use Authorization (EUA).  This EUA will remain in effect (meaning this test can be  used) for the duration of the COVID-19 declaration under Section 564(b)(1) of the Act, 21 U.S.C. section 360bbb-3(b)(1), unless the authorization is terminated or revoked sooner.  Performed at [facility], [address], [place], [place] [address]          Radiology Studies: CT Angio Chest PE W and/or Wo Contrast  Result Date: 12/11/2019 CLINICAL DATA:  86 year old female with concern for pulmonary embolism. EXAM: CT ANGIOGRAPHY CHEST WITH CONTRAST TECHNIQUE: Multidetector CT imaging of the chest was performed using the standard protocol during bolus administration of intravenous contrast. Multiplanar CT image reconstructions and MIPs were obtained to evaluate the vascular anatomy. CONTRAST:  33mL OMNIPAQUE IOHEXOL 350 MG/ML SOLN COMPARISON:  Chest CT dated 11/17/2019. FINDINGS: Cardiovascular: There is no cardiomegaly or pericardial effusion. There is 3 vessel coronary vascular calcification. Moderate atherosclerotic calcification of the thoracic aorta. No aneurysmal dilatation or dissection. The origins of the great vessels of the aortic arch appear patent. Evaluation of the pulmonary arteries is limited due to respiratory motion artifact. No pulmonary artery embolus identified. Mediastinum/Nodes: There is no hilar or mediastinal adenopathy. The esophagus and the thyroid gland are grossly unremarkable. No mediastinal fluid collection. Lungs/Pleura: There are bibasilar dependent atelectasis. No focal consolidation, pleural effusion, or pneumothorax. The central airways are patent. Upper Abdomen: No acute abnormality. Musculoskeletal: Degenerative changes of the spine. No acute osseous pathology. Review of the MIP images confirms the above findings. IMPRESSION: 1. No acute intrathoracic pathology. No CT evidence of pulmonary embolism. 2. Aortic Atherosclerosis (ICD10-I70.0). Electronically Signed   By: Elgie Collard M.D.   On: 12/11/2019 02:19   CARDIAC CATHETERIZATION  Result  Date: 12/12/2019  Mid Cx lesion is 30% stenosed.  Prox LAD to Mid LAD lesion is 60% stenosed.  Mid LAD lesion is 10% stenosed.  Prox RCA to Mid RCA lesion is 10% stenosed.  Mid RCA lesion is 10% stenosed.  Dist RCA lesion is 95% stenosed.  Mid RCA to Dist RCA lesion is 40% stenosed.  RPDA lesion is 60% stenosed.  A drug-eluting stent was successfully placed using a STENT RESOLUTE ONYX 2.25X15.  Post intervention, there is a 0% residual stenosis.  1.  Non-ST elevation myocardial infarction 2.  Patent stents mid LAD, proximal RCA, and mid RCA 3.  Nonobstructive  60% stenosis proximal LAD 4.  High-grade 95% stenosis distal RCA 5.  Normal left ventricular function with estimated LV ejection fraction greater than 55% 6.  Successful PCI, with DES distal RCA Recommendations 1.  Continue dual antiplatelet therapy uninterrupted for 1 year 2.  Aggressive risk factor modification   DG Chest Portable 1 View  Result Date: 12/11/2019 CLINICAL DATA:  86 year old female with chest pain. EXAM: PORTABLE CHEST 1 VIEW COMPARISON:  Chest radiograph dated 11/17/2019 and CT dated 11/17/2019 FINDINGS: No focal consolidation, pleural effusion, pneumothorax. The cardiac silhouette is within limits. Coronary vascular calcification or stent. Atherosclerotic calcification of the aorta. No acute osseous pathology. IMPRESSION: No active disease. Electronically Signed   By: Elgie Collard M.D.   On: 12/11/2019 00:32        Scheduled Meds: . amLODipine  5 mg Oral Daily  . aspirin      . [START ON 12/13/2019] aspirin  81 mg Oral Daily  . cloNIDine  0.1 mg Transdermal Weekly  . [START ON 12/13/2019] clopidogrel  75 mg Oral Q breakfast  . insulin aspart  0-5 Units Subcutaneous QHS  . insulin aspart  0-9 Units Subcutaneous TID WC  . isosorbide mononitrate  60 mg Oral Daily  . labetalol  10 mg Intravenous Once  . rosuvastatin  20 mg Oral Daily  . sodium chloride flush  3 mL Intravenous Q12H  . [START ON 12/13/2019] sodium  chloride flush  3 mL Intravenous Q12H  . torsemide  20 mg Oral QPM   Continuous Infusions: . sodium chloride 50 mL/hr at 12/11/19 2252  . [START ON 12/13/2019] sodium chloride    . sodium chloride       Time spent: 20  minutes with over 50% of the time coordinating the patient's care    Jae Dire, DO [facility] Pager [phone]  Call night coverage person covering after 7pm

## 2019-12-12 NOTE — Progress Notes (Signed)
Dr. Darrold Junker in at bedside, speaking with pt. And her son [name] re: PCI procedure.Both verbalized understanding of conversation. 12 lead EKG done post PCI upon arrival to recovery.

## 2019-12-13 ENCOUNTER — Encounter: Payer: Self-pay | Admitting: Cardiology

## 2019-12-13 DIAGNOSIS — I200 Unstable angina: Secondary | ICD-10-CM

## 2019-12-13 DIAGNOSIS — N1831 Chronic kidney disease, stage 3a: Secondary | ICD-10-CM

## 2019-12-13 DIAGNOSIS — E1159 Type 2 diabetes mellitus with other circulatory complications: Secondary | ICD-10-CM

## 2019-12-13 DIAGNOSIS — I5032 Chronic diastolic (congestive) heart failure: Secondary | ICD-10-CM

## 2019-12-13 DIAGNOSIS — R5381 Other malaise: Secondary | ICD-10-CM

## 2019-12-13 DIAGNOSIS — I214 Non-ST elevation (NSTEMI) myocardial infarction: Principal | ICD-10-CM

## 2019-12-13 LAB — CBC
HCT: 32.7 % — ABNORMAL LOW (ref 36.0–46.0)
Hemoglobin: 11.4 g/dL — ABNORMAL LOW (ref 12.0–15.0)
MCH: 33.6 pg (ref 26.0–34.0)
MCHC: 34.9 g/dL (ref 30.0–36.0)
MCV: 96.5 fL (ref 80.0–100.0)
Platelets: 250 10*3/uL (ref 150–400)
RBC: 3.39 MIL/uL — ABNORMAL LOW (ref 3.87–5.11)
RDW: 14 % (ref 11.5–15.5)
WBC: 8.5 10*3/uL (ref 4.0–10.5)
nRBC: 0 % (ref 0.0–0.2)

## 2019-12-13 LAB — BASIC METABOLIC PANEL
Anion gap: 9 (ref 5–15)
BUN: 26 mg/dL — ABNORMAL HIGH (ref 8–23)
CO2: 26 mmol/L (ref 22–32)
Calcium: 9 mg/dL (ref 8.9–10.3)
Chloride: 104 mmol/L (ref 98–111)
Creatinine, Ser: 1.24 mg/dL — ABNORMAL HIGH (ref 0.44–1.00)
GFR calc Af Amer: 46 mL/min — ABNORMAL LOW (ref >60)
GFR calc non Af Amer: 39 mL/min — ABNORMAL LOW (ref >60)
Glucose, Bld: 121 mg/dL — ABNORMAL HIGH (ref 70–99)
Potassium: 4.3 mmol/L (ref 3.5–5.1)
Sodium: 139 mmol/L (ref 135–145)

## 2019-12-13 LAB — GLUCOSE, CAPILLARY
Glucose-Capillary: 112 mg/dL — ABNORMAL HIGH (ref 70–99)
Glucose-Capillary: 153 mg/dL — ABNORMAL HIGH (ref 70–99)

## 2019-12-13 MED ORDER — IOHEXOL 300 MG/ML  SOLN
INTRAMUSCULAR | Status: DC | PRN
  Administered 2019-12-12: 85 mL

## 2019-12-13 NOTE — Discharge Summary (Signed)
Physician Discharge Summary  [name] WUJ:[id number] DOB: Oct 19, 1930 DOA: 12/10/2019  PCP: Danella Penton, MD  Admit date: 12/10/2019 Discharge date: 12/13/2019  Admitted From: Home. Disposition: Home  Recommendations for Outpatient Follow-up:  1. Follow ups as below. 2. Please obtain CBC/BMP/Mag at follow up 3. Please follow up on the following pending results: None  Home Health: PT/OT Equipment/Devices: Patient has appropriate DME's at home  Discharge Condition: Stable CODE STATUS: Full code   Follow-up Information    Marcina Millard, MD On 12/19/2019.   Specialty: Cardiology Why: Appointment at 11:00am Contact information: [address] [facility] [place] [address] [phone]        Danella Penton, MD. Schedule an appointment as soon as possible for a visit in 1 week(s).   Specialty: Internal Medicine Contact information: [facility] [address] [place] [address] [phone]        Health, [facility]-Home Follow up.   Specialty: Home Health Services Why: Physical Therapy, Occupational Therapy               Hospital Course: 86 year old female with history of CAD s/p multiple stents, DM-2, HTN, HLD, CKD-3A, carotid stenosis, ambulatory dysfunction presenting with typical chest pain and admitted for hypertensive emergency and non-STEMI.  In ED, BP 203/71.  105.  Afebrile.  100% on room air.  High-sensitivity troponin 12. Cr 1.31.  EKG sinus tachycardia with lateral ST depressions.  Patient's troponin peaked at 922.  Started on IV heparin.  She underwent left heart catheterization with successful PCI and DES to distal RCA for 95% stenosis on 8/24. She remained stable overnight and discharged on 8/25.  Patient was cleared for discharge by cardiology. She was evaluated by therapy who recommended home health PT/OT.  Patient has walker at home.  Patient to follow-up with PCP and cardiology  outpatient.  See individual problem list below for more hospital course.   Discharge Diagnoses:  Typical chest pain/Non-STEMI and patient with history of CAD and multiple stents -Had PCI with DES stent for 95% RCA stenosis -Continue Imdur, Plavix, aspirin and statin. -Not on beta-blocker due to bradycardia induced syncope -Outpatient follow-up with cardiology  Hypertensive emergency: BP improved. -Continue home amlodipine, clonidine, Imdur and torsemide  Chronic diastolic CHF: Echo on 11/20/2019 with EF of 60 to 65% and G1 DD.  Appears euvolemic. -Continue home torsemide  Controlled DM-2 with hyperglycemia: A1c 6.5% on 11/18/2019. No results for input(s): HGBA1C in the last 72 hours. Recent Labs  Lab 12/12/19 1237 12/12/19 1729 12/12/19 2102 12/13/19 0736 12/13/19 1236  GLUCAP 113* 163* 139* 112* 153*  -Discharged on home medications and statin.  Debility/physical deconditioning -Home health PT/OT -Patient has walker at home.  CKD-3A: Stable  Body mass index is 28.72 kg/m.            Discharge Exam: Vitals:   12/13/19 1242 12/13/19 1512  BP: (!) 137/54 (!) 144/56  Pulse: 80 100  Resp: 18 19  Temp: 98 F (36.7 C) 98.2 F (36.8 C)  SpO2: 98% 99%    GENERAL: No apparent distress.  Nontoxic. HEENT: MMM.  Vision and hearing grossly intact.  NECK: Supple.  No apparent JVD.  RESP:  No IWOB.  Fair aeration bilaterally. CVS:  RRR. Heart sounds normal.  ABD/GI/GU: Bowel sounds present. Soft. Non tender.  MSK/EXT:  Moves extremities. No apparent deformity. No edema.  SKIN: no apparent skin lesion or wound NEURO: Awake, alert and oriented appropriately.  No apparent  focal neuro deficit. PSYCH: Calm. Normal affect.  Discharge Instructions  Discharge Instructions    AMB Referral to Cardiac Rehabilitation - Phase II   Complete by: As directed    Diagnosis:  Coronary Stents NSTEMI     After initial evaluation and assessments completed: Virtual Based Care may be  provided alone or in conjunction with Phase 2 Cardiac Rehab based on patient barriers.: Yes   Call MD for:  difficulty breathing, headache or visual disturbances   Complete by: As directed    Call MD for:  extreme fatigue   Complete by: As directed    Call MD for:  persistant dizziness or light-headedness   Complete by: As directed    Call MD for:  persistant nausea and vomiting   Complete by: As directed    Call MD for:  severe uncontrolled pain   Complete by: As directed    Call MD for:  temperature >100.4   Complete by: As directed    Diet - low sodium heart healthy   Complete by: As directed    Diet Carb Modified   Complete by: As directed    Increase activity slowly   Complete by: As directed      Allergies as of 12/13/2019      Reactions   Butenafine Swelling   Tongue swelled and turned red   Levofloxacin Swelling, Other (See Comments)   Other reaction(s): Other (See Comments) Tongue swelled and turned red   Limonene Other (See Comments)   Other reaction(s): Other (See Comments) Tongue swelled and turned red Tongue swelled and turned red   Terbinafine And Related Other (See Comments)   Tongue swelled and turned red      Medication List    TAKE these medications   amLODipine 5 MG tablet Commonly known as: NORVASC Take 5 mg by mouth daily.   aspirin EC 81 MG tablet Take 81 mg by mouth daily.   cloNIDine 0.1 mg/24hr patch Commonly known as: CATAPRES - Dosed in mg/24 hr 0.1 mg once a week.   clopidogrel 75 MG tablet Commonly known as: PLAVIX Take 75 mg by mouth daily.   donepezil 5 MG tablet Commonly known as: ARICEPT Take 5 mg by mouth daily.   gabapentin 100 MG capsule Commonly known as: NEURONTIN Take 100 mg by mouth 3 (three) times daily.   isosorbide mononitrate 60 MG 24 hr tablet Commonly known as: IMDUR Take 60 mg by mouth daily.   magnesium oxide 400 MG tablet Commonly known as: MAG-OX Take 400 mg by mouth daily.   nitroGLYCERIN 0.4 MG SL  tablet Commonly known as: NITROSTAT Place 0.4 mg under the tongue every 5 (five) minutes as needed. For chest pain   omeprazole 20 MG capsule Commonly known as: PRILOSEC Take 20 mg by mouth daily.   rosuvastatin 20 MG tablet Commonly known as: CRESTOR Take 20 mg by mouth daily.   timolol 0.5 % ophthalmic solution Commonly known as: TIMOPTIC Place 1 drop into both eyes at bedtime.   tiZANidine 2 MG tablet Commonly known as: ZANAFLEX Take 2 mg by mouth 3 (three) times daily.   torsemide 20 MG tablet Commonly known as: DEMADEX Take 30 mg by mouth every evening.       Consultations:  Cardiology  Procedures/Studies:  12/12/2019-left heart catheterization with PCI and DES stent to RCA for 95% stenosis   CT Head Wo Contrast  Result Date: 11/27/2019 CLINICAL DATA:  Neck pain following a fall. EXAM: CT HEAD WITHOUT CONTRAST  CT CERVICAL SPINE WITHOUT CONTRAST TECHNIQUE: Multidetector CT imaging of the head and cervical spine was performed following the standard protocol without intravenous contrast. Multiplanar CT image reconstructions of the cervical spine were also generated. COMPARISON:  03/30/2019 FINDINGS: CT HEAD FINDINGS Brain: Stable moderately enlarged ventricles and subarachnoid spaces. Mild-to-moderate patchy white matter low density in both cerebral hemispheres without significant change. No intracranial hemorrhage, mass lesion or CT evidence of acute infarction. Vascular: No hyperdense vessel or unexpected calcification. Skull: Normal. Negative for fracture or focal lesion. Sinuses/Orbits: Unremarkable. Other: None. CT CERVICAL SPINE FINDINGS Alignment: Stable reversal of the normal cervical lordosis. Stable anterolisthesis at the C2-3 and C4-5 levels. No acute subluxations. Skull base and vertebrae: No acute fracture. No primary bone lesion or focal pathologic process. Soft tissues and spinal canal: No prevertebral fluid or swelling. No visible canal hematoma. Disc levels:  Stable multilevel degenerative changes. These include facet degenerative changes. Upper chest: Clear lung apices. Other: Dense bilateral carotid artery calcifications. IMPRESSION: 1. No skull fracture or intracranial hemorrhage. 2. No cervical spine fracture or traumatic subluxation. 3. Stable moderate diffuse cerebral and cerebellar atrophy and mild-to-moderate chronic small vessel white matter ischemic changes in both cerebral hemispheres. 4. Stable reversal of the normal cervical lordosis and multilevel cervical spine degenerative changes. 5. Dense bilateral carotid artery atheromatous calcifications. Electronically Signed   By: Beckie Salts M.D.   On: 11/27/2019 19:48   CT Angio Chest PE W and/or Wo Contrast  Result Date: 12/11/2019 CLINICAL DATA:  86 year old female with concern for pulmonary embolism. EXAM: CT ANGIOGRAPHY CHEST WITH CONTRAST TECHNIQUE: Multidetector CT imaging of the chest was performed using the standard protocol during bolus administration of intravenous contrast. Multiplanar CT image reconstructions and MIPs were obtained to evaluate the vascular anatomy. CONTRAST:  51mL OMNIPAQUE IOHEXOL 350 MG/ML SOLN COMPARISON:  Chest CT dated 11/17/2019. FINDINGS: Cardiovascular: There is no cardiomegaly or pericardial effusion. There is 3 vessel coronary vascular calcification. Moderate atherosclerotic calcification of the thoracic aorta. No aneurysmal dilatation or dissection. The origins of the great vessels of the aortic arch appear patent. Evaluation of the pulmonary arteries is limited due to respiratory motion artifact. No pulmonary artery embolus identified. Mediastinum/Nodes: There is no hilar or mediastinal adenopathy. The esophagus and the thyroid gland are grossly unremarkable. No mediastinal fluid collection. Lungs/Pleura: There are bibasilar dependent atelectasis. No focal consolidation, pleural effusion, or pneumothorax. The central airways are patent. Upper Abdomen: No acute  abnormality. Musculoskeletal: Degenerative changes of the spine. No acute osseous pathology. Review of the MIP images confirms the above findings. IMPRESSION: 1. No acute intrathoracic pathology. No CT evidence of pulmonary embolism. 2. Aortic Atherosclerosis (ICD10-I70.0). Electronically Signed   By: Elgie Collard M.D.   On: 12/11/2019 02:19   CT Angio Chest PE W and/or Wo Contrast  Result Date: 11/17/2019 CLINICAL DATA:  Positive COVID, cough EXAM: CT ANGIOGRAPHY CHEST WITH CONTRAST TECHNIQUE: Multidetector CT imaging of the chest was performed using the standard protocol during bolus administration of intravenous contrast. Multiplanar CT image reconstructions and MIPs were obtained to evaluate the vascular anatomy. CONTRAST:  74mL OMNIPAQUE IOHEXOL 350 MG/ML SOLN COMPARISON:  March 30, 2019 FINDINGS: Cardiovascular: There is a optimal opacification of the pulmonary arteries. There is no central,segmental, or subsegmental filling defects within the pulmonary arteries. There is mild cardiomegaly. Coronary artery calcifications are seen. No pericardial effusion or thickening. No evidence right heart strain. There is a patent four-vessel arch with a common origin of the left vertebral artery off the arch. Scattered aortic  atherosclerosis is noted. Mediastinum/Nodes: No hilar, mediastinal, or axillary adenopathy. Thyroid gland, trachea, and esophagus demonstrate no significant findings. Lungs/Pleura: There is a small focal patchy opacity seen at the left lung base. The right lung is clear. No pleural effusion is seen. Upper Abdomen: No acute abnormalities present in the visualized portions of the upper abdomen. Musculoskeletal: No chest wall abnormality. No acute or significant osseous findings. Review of the MIP images confirms the above findings. IMPRESSION: No central, segmental, or subsegmental pulmonary embolism. Small focal patchy area at the left lung base which could be due to atelectasis and/or  early infectious etiology. Aortic Atherosclerosis (ICD10-I70.0). Electronically Signed   By: Jonna Clark M.D.   On: 11/17/2019 21:40   CT Cervical Spine Wo Contrast  Result Date: 11/27/2019 CLINICAL DATA:  Neck pain following a fall. EXAM: CT HEAD WITHOUT CONTRAST CT CERVICAL SPINE WITHOUT CONTRAST TECHNIQUE: Multidetector CT imaging of the head and cervical spine was performed following the standard protocol without intravenous contrast. Multiplanar CT image reconstructions of the cervical spine were also generated. COMPARISON:  03/30/2019 FINDINGS: CT HEAD FINDINGS Brain: Stable moderately enlarged ventricles and subarachnoid spaces. Mild-to-moderate patchy white matter low density in both cerebral hemispheres without significant change. No intracranial hemorrhage, mass lesion or CT evidence of acute infarction. Vascular: No hyperdense vessel or unexpected calcification. Skull: Normal. Negative for fracture or focal lesion. Sinuses/Orbits: Unremarkable. Other: None. CT CERVICAL SPINE FINDINGS Alignment: Stable reversal of the normal cervical lordosis. Stable anterolisthesis at the C2-3 and C4-5 levels. No acute subluxations. Skull base and vertebrae: No acute fracture. No primary bone lesion or focal pathologic process. Soft tissues and spinal canal: No prevertebral fluid or swelling. No visible canal hematoma. Disc levels: Stable multilevel degenerative changes. These include facet degenerative changes. Upper chest: Clear lung apices. Other: Dense bilateral carotid artery calcifications. IMPRESSION: 1. No skull fracture or intracranial hemorrhage. 2. No cervical spine fracture or traumatic subluxation. 3. Stable moderate diffuse cerebral and cerebellar atrophy and mild-to-moderate chronic small vessel white matter ischemic changes in both cerebral hemispheres. 4. Stable reversal of the normal cervical lordosis and multilevel cervical spine degenerative changes. 5. Dense bilateral carotid artery atheromatous  calcifications. Electronically Signed   By: Beckie Salts M.D.   On: 11/27/2019 19:48   CARDIAC CATHETERIZATION  Result Date: 12/12/2019  Mid Cx lesion is 30% stenosed.  Prox LAD to Mid LAD lesion is 60% stenosed.  Mid LAD lesion is 10% stenosed.  Prox RCA to Mid RCA lesion is 10% stenosed.  Mid RCA lesion is 10% stenosed.  Dist RCA lesion is 95% stenosed.  Mid RCA to Dist RCA lesion is 40% stenosed.  RPDA lesion is 60% stenosed.  A drug-eluting stent was successfully placed using a STENT RESOLUTE ONYX [id number].  Post intervention, there is a 0% residual stenosis.  1.  Non-ST elevation myocardial infarction 2.  Patent stents mid LAD, proximal RCA, and mid RCA 3.  Nonobstructive 60% stenosis proximal LAD 4.  High-grade 95% stenosis distal RCA 5.  Normal left ventricular function with estimated LV ejection fraction greater than 55% 6.  Successful PCI, with DES distal RCA Recommendations 1.  Continue dual antiplatelet therapy uninterrupted for 1 year 2.  Aggressive risk factor modification   DG Chest Portable 1 View  Result Date: 12/11/2019 CLINICAL DATA:  86 year old female with chest pain. EXAM: PORTABLE CHEST 1 VIEW COMPARISON:  Chest radiograph dated 11/17/2019 and CT dated 11/17/2019 FINDINGS: No focal consolidation, pleural effusion, pneumothorax. The cardiac silhouette is within limits. Coronary vascular  calcification or stent. Atherosclerotic calcification of the aorta. No acute osseous pathology. IMPRESSION: No active disease. Electronically Signed   By: Elgie Collard M.D.   On: 12/11/2019 00:32   DG Chest Portable 1 View  Result Date: 11/17/2019 CLINICAL DATA:  Cough, chest pain, COVID positive. EXAM: PORTABLE CHEST 1 VIEW COMPARISON:  03/15/2019 FINDINGS: The cardiomediastinal contours are normal. Aortic atherosclerosis. Minimal subsegmental opacities in the lung bases. Pulmonary vasculature is normal. No consolidation, pleural effusion, or pneumothorax. No acute osseous abnormalities  are seen. IMPRESSION: Minimal subsegmental bibasilar opacities, may be atelectasis or atypical viral pneumonia in the setting of COVID. Aortic Atherosclerosis (ICD10-I70.0). Electronically Signed   By: Narda Rutherford M.D.   On: 11/17/2019 20:28   ECHOCARDIOGRAM COMPLETE  Result Date: 11/20/2019    ECHOCARDIOGRAM REPORT   Patient Name:   MICKY OVERTURF Humphrey Date of Exam: 11/20/2019 Medical Rec #:  [id number]     Height:       62.0 in Accession #:    [id number]    Weight:       136.7 lb Date of Birth:  08/07/1930     BSA:          1.626 m Patient Age:    88 years      BP:           176/59 mmHg Patient Gender: F             HR:           62 bpm. Exam Location:  [facility] Procedure: Cardiac Doppler and Color Doppler Indications:     Abnormal ECG 794.31  History:         Patient has no prior history of Echocardiogram examinations.                  Previous Myocardial Infarction; Risk Factors:Hypertension. S/P                  angioplast with stent.  Sonographer:     Cristela Blue RDCS (AE) Referring Phys:  [id number] Gardiner Ramus MATHEWS Diagnosing Phys: Harold Hedge MD  Sonographer Comments: No apical window. IMPRESSIONS  1. Left ventricular ejection fraction, by estimation, is 60 to 65%. Left ventricular ejection fraction by PLAX is 69 %. The left ventricle has normal function. The left ventricle has no regional wall motion abnormalities. There is mild left ventricular hypertrophy. Left ventricular diastolic parameters are consistent with Grade I diastolic dysfunction (impaired relaxation).  2. Right ventricular systolic function is normal. The right ventricular size is mildly enlarged.  3. The mitral valve was not well visualized. Trivial mitral valve regurgitation.  4. The aortic valve is grossly normal. Aortic valve regurgitation is not visualized. FINDINGS  Left Ventricle: Left ventricular ejection fraction, by estimation, is 60 to 65%. Left ventricular ejection fraction by PLAX is 69 %. The left ventricle has normal function. The  left ventricle has no regional wall motion abnormalities. The left ventricular internal cavity size was normal in size. There is mild left ventricular hypertrophy. Left ventricular diastolic parameters are consistent with Grade I diastolic dysfunction (impaired relaxation). Right Ventricle: The right ventricular size is mildly enlarged. No increase in right ventricular wall thickness. Right ventricular systolic function is normal. Left Atrium: Left atrial size was normal in size. Right Atrium: Right atrial size was normal in size. Pericardium: There is no evidence of pericardial effusion. Mitral Valve: The mitral valve was not well visualized. Trivial mitral valve regurgitation. Tricuspid Valve: The tricuspid valve is not  well visualized. Tricuspid valve regurgitation is mild. Aortic Valve: The aortic valve is grossly normal. Aortic valve regurgitation is not visualized. Pulmonic Valve: The pulmonic valve was not well visualized. Pulmonic valve regurgitation is mild. Aorta: The aortic root was not well visualized. IAS/Shunts: The interatrial septum was not assessed.  LEFT VENTRICLE PLAX 2D LV EF:         Left ventricular ejection fraction by PLAX is 69 %. LVIDd:         3.35 cm LVIDs:         2.09 cm LV PW:         1.43 cm LV IVS:        1.27 cm LVOT diam:     2.00 cm LVOT Area:     3.14 cm  LEFT ATRIUM         Index LA diam:    3.00 cm 1.84 cm/m                        PULMONIC VALVE AORTA                 PV Vmax:        0.72 m/s Ao Root diam: 2.80 cm PV Peak grad:   2.1 mmHg                       RVOT Peak grad: 4 mmHg   SHUNTS Systemic Diam: 2.00 cm Harold Hedge MD Electronically signed by Harold Hedge MD Signature Date/Time: 11/20/2019/1:02:21 PM    Final        The results of significant diagnostics from this hospitalization (including imaging, microbiology, ancillary and laboratory) are listed below for reference.     Microbiology: Recent Results (from the past 240 hour(s))  SARS Coronavirus 2 by RT  PCR (hospital order, performed in [facility] hospital lab) Nasopharyngeal Nasopharyngeal Swab     Status: None   Collection Time: 12/11/19 11:44 AM   Specimen: Nasopharyngeal Swab  Result Value Ref Range Status   SARS Coronavirus 2 NEGATIVE NEGATIVE Final    Comment: (NOTE) SARS-CoV-2 target nucleic acids are NOT DETECTED.  The SARS-CoV-2 RNA is generally detectable in upper and lower respiratory specimens during the acute phase of infection. The lowest concentration of SARS-CoV-2 viral copies this assay can detect is 250 copies / mL. A negative result does not preclude SARS-CoV-2 infection and should not be used as the sole basis for treatment or other patient management decisions.  A negative result may occur with improper specimen collection / handling, submission of specimen other than nasopharyngeal swab, presence of viral mutation(s) within the areas targeted by this assay, and inadequate number of viral copies (<250 copies / mL). A negative result must be combined with clinical observations, patient history, and epidemiological information.  Fact Sheet for Patients:   BoilerBrush.com.cy  Fact Sheet for Healthcare Providers: https://pope.com/  This test is not yet approved or  cleared by the [place] FDA and has been authorized for detection and/or diagnosis of SARS-CoV-2 by FDA under an Emergency Use Authorization (EUA).  This EUA will remain in effect (meaning this test can be used) for the duration of the COVID-19 declaration under Section 564(b)(1) of the Act, 21 U.S.C. section 360bbb-3(b)(1), unless the authorization is terminated or revoked sooner.  Performed at [facility], [address] [place], [place] [address]      Labs: BNP (last 3 results) No results for input(s): BNP  in the last 8760 hours. Basic Metabolic Panel: Recent Labs  Lab 12/11/19 0001 12/12/19 0308 12/13/19 0519  NA 136 140  139  K 3.7 4.3 4.3  CL 99 105 104  CO2 24 26 26   GLUCOSE 172* 127* 121*  BUN 23 22 26*  CREATININE 1.31* 1.03* 1.24*  CALCIUM 9.5 9.2 9.0   Liver Function Tests: No results for input(s): AST, ALT, ALKPHOS, BILITOT, PROT, ALBUMIN in the last 168 hours. No results for input(s): LIPASE, AMYLASE in the last 168 hours. No results for input(s): AMMONIA in the last 168 hours. CBC: Recent Labs  Lab 12/11/19 0001 12/12/19 0308 12/13/19 0519  WBC 8.5 8.5 8.5  HGB 12.1 11.6* 11.4*  HCT 34.6* 34.4* 32.7*  MCV 96.9 99.4 96.5  PLT 255 245 250   Cardiac Enzymes: No results for input(s): CKTOTAL, CKMB, CKMBINDEX, TROPONINI in the last 168 hours. BNP: Invalid input(s): POCBNP CBG: Recent Labs  Lab 12/12/19 1237 12/12/19 1729 12/12/19 2102 12/13/19 0736 12/13/19 1236  GLUCAP 113* 163* 139* 112* 153*   D-Dimer No results for input(s): DDIMER in the last 72 hours. Hgb A1c No results for input(s): HGBA1C in the last 72 hours. Lipid Profile No results for input(s): CHOL, HDL, LDLCALC, TRIG, CHOLHDL, LDLDIRECT in the last 72 hours. Thyroid function studies No results for input(s): TSH, T4TOTAL, T3FREE, THYROIDAB in the last 72 hours.  Invalid input(s): FREET3 Anemia work up No results for input(s): VITAMINB12, FOLATE, FERRITIN, TIBC, IRON, RETICCTPCT in the last 72 hours. Urinalysis    Component Value Date/Time   COLORURINE STRAW (A) 11/27/2019 1922   APPEARANCEUR CLEAR (A) 11/27/2019 1922   APPEARANCEUR CLEAR 08/15/2012 1031   LABSPEC 1.005 11/27/2019 1922   LABSPEC 1.015 08/15/2012 1031   PHURINE 5.0 11/27/2019 1922   GLUCOSEU NEGATIVE 11/27/2019 1922   GLUCOSEU 300 mg/dL 01/27/2020 93/73/4287   HGBUR NEGATIVE 11/27/2019 1922   BILIRUBINUR NEGATIVE 11/27/2019 1922   BILIRUBINUR NEGATIVE 08/15/2012 1031   KETONESUR NEGATIVE 11/27/2019 1922   PROTEINUR NEGATIVE 11/27/2019 1922   NITRITE NEGATIVE 11/27/2019 1922   LEUKOCYTESUR NEGATIVE 11/27/2019 1922   LEUKOCYTESUR NEGATIVE  08/15/2012 1031   Sepsis Labs Invalid input(s): PROCALCITONIN,  WBC,  LACTICIDVEN   Time coordinating discharge: 35 minutes  SIGNED:  08/17/2012, MD  [facility] 12/13/2019, 5:08 PM  If 7PM-7AM, please contact night-coverage [email or web address]

## 2019-12-13 NOTE — Progress Notes (Signed)
[facility] Cardiology    SUBJECTIVE: The patient reports feeling fine this morning with no complaints. She denies chest pain or shortness of breath. She denies pain or drainage at the femoral access site.   Vitals:   12/12/19 1622 12/12/19 2031 12/13/19 0534 12/13/19 0735  BP: (!) 122/46 (!) 140/59 (!) 144/44 (!) 154/48  Pulse: 71 82 77 78  Resp: 16   18  Temp:  97.7 F (36.5 C) 98.4 F (36.9 C) 98.6 F (37 C)  TempSrc:  Oral Oral Oral  SpO2: 99% 99% 98% 96%  Weight:      Height:         Intake/Output Summary (Last 24 hours) at 12/13/2019 9892 Last data filed at 12/13/2019 1194 Gross per 24 hour  Intake 1110.84 ml  Output 2000 ml  Net -889.16 ml      PHYSICAL EXAM  General: Well developed, well nourished, in no acute distress, sitting up in bed, appears comfortable HEENT:  Normocephalic and atramatic Neck:  No JVD.  Lungs: Clear bilaterally to auscultation, normal effort of breathing on room air. Heart: HRRR . Normal S1 and S2 without gallops or murmurs.  Abdomen: nondistended Msk:  Back normal, gait not assessed. No obvious deformities Extremities: No clubbing, cyanosis or edema. Right groin- bandage removed, no hematoma, drainage, erythema, warmth, or edema. Neuro: Alert and oriented X 3. Psych:  Good affect, responds appropriately   LABS: Basic Metabolic Panel: Recent Labs    12/12/19 0308 12/13/19 0519  NA 140 139  K 4.3 4.3  CL 105 104  CO2 26 26  GLUCOSE 127* 121*  BUN 22 26*  CREATININE 1.03* 1.24*  CALCIUM 9.2 9.0   Liver Function Tests: No results for input(s): AST, ALT, ALKPHOS, BILITOT, PROT, ALBUMIN in the last 72 hours. No results for input(s): LIPASE, AMYLASE in the last 72 hours. CBC: Recent Labs    12/12/19 0308 12/13/19 0519  WBC 8.5 8.5  HGB 11.6* 11.4*  HCT 34.4* 32.7*  MCV 99.4 96.5  PLT 245 250   Cardiac Enzymes: No results for input(s): CKTOTAL, CKMB, CKMBINDEX, TROPONINI in the last 72 hours. BNP: Invalid input(s):  POCBNP D-Dimer: No results for input(s): DDIMER in the last 72 hours. Hemoglobin A1C: No results for input(s): HGBA1C in the last 72 hours. Fasting Lipid Panel: No results for input(s): CHOL, HDL, LDLCALC, TRIG, CHOLHDL, LDLDIRECT in the last 72 hours. Thyroid Function Tests: No results for input(s): TSH, T4TOTAL, T3FREE, THYROIDAB in the last 72 hours.  Invalid input(s): FREET3 Anemia Panel: No results for input(s): VITAMINB12, FOLATE, FERRITIN, TIBC, IRON, RETICCTPCT in the last 72 hours.  CARDIAC CATHETERIZATION  Result Date: 12/12/2019  Mid Cx lesion is 30% stenosed.  Prox LAD to Mid LAD lesion is 60% stenosed.  Mid LAD lesion is 10% stenosed.  Prox RCA to Mid RCA lesion is 10% stenosed.  Mid RCA lesion is 10% stenosed.  Dist RCA lesion is 95% stenosed.  Mid RCA to Dist RCA lesion is 40% stenosed.  RPDA lesion is 60% stenosed.  A drug-eluting stent was successfully placed using a STENT RESOLUTE ONYX 2.25X15.  Post intervention, there is a 0% residual stenosis.  1.  Non-ST elevation myocardial infarction 2.  Patent stents mid LAD, proximal RCA, and mid RCA 3.  Nonobstructive 60% stenosis proximal LAD 4.  High-grade 95% stenosis distal RCA 5.  Normal left ventricular function with estimated LV ejection fraction greater than 55% 6.  Successful PCI, with DES distal RCA Recommendations 1.  Continue dual  antiplatelet therapy uninterrupted for 1 year 2.  Aggressive risk factor modification    TELEMETRY: sinus rhythm, 70s  ASSESSMENT AND PLAN:  Principal Problem:   Hypertensive urgency Active Problems:   Chest pain   CAD (coronary artery disease)   Type 2 diabetes mellitus (HCC)   CKD (chronic kidney disease) stage 3, GFR 30-59 ml/min   Hypertensive emergency    1. NSTEMI in setting of hypertensive urgency, presenting with chest pain that resolved when blood pressure improved, with initial ECG revealing inferolateral ST depression and repeat ECG revealing resolved ST depression  with significant q wave in lead III, not in II and aVF. Troponin 12-> 582-> 922-> 396. She was placed on heparin drip and underwent cardiac catheterization on 12/12/2019, which revealed normal left ventricular function with LVEF greater than 55% with patent stents mid LAD, proximal RCA, and mid RCA, nonobstructive 60% stenosis proximal LAD, high grade 95% stenosis distal RCA, with successful PCI with Resolute Onyx 2.25 x 15 mm DES to distal RCA. There were no apparent perioperative complications.  2. Hypertensive urgency, resolved 3. Coronary artery disease, status post MI in 1980s with multiple stents, with most recent cardiac catheterization in 2014 revealing patent stents with 60% stenosis proximal LAD.   Recommendations: 1. Recommend continuation of aspirin and Plavix uninterrupted for 1 year 2. Defer beta blocker with history of symptomatic bradycardia causing syncope. Defer ACE/ARB with history of drug-induced tongue swelling, CKD 3. Continue high intensity statin, Crestor 20 mg daily 4. Recommend discharge home today with increased dose of amlodipine to 10 mg, clonidine patch 0.1 mg weekly, isosorbide mononitrate 60 mg, and torsemide 20 mg.  5. Cardiac rehab 6. Follow-up with Dr. Darrold Junker in 1 week  Sign off for now; please call/[facility] with any questions. Discussed with Dr. Darrold Junker.    Leanora Ivanoff, PA-C 12/13/2019 8:24 AM    AMI Discharge   Aspirin prescribed at discharge:  Yes  High Intensity Statin Prescribed? (Lipitor 40-80mg  or Crestor 20-40mg ): Yes  Beta Blocker Prescribed: No: history of beta blocker induced syncope, bradycardia  ADP Receptor Inhibitor Prescribed? (i.e. Plavix etc.-Includes Medically Managed Patients): Yes  ACEI/ARB Prescribed? (If NO document contraindications)  No  Angioedema/Cough/Allergy, Worsening renal function/renal disease/dysfunction  Aldosterone Inhibitor Prescribed? Yes  Was EF assessed during THIS hospitalization? Yes, via LHC  (YES =  Measured in current episode of care or document plan to evaluate after discharge.)  Was EF < 40% ? No: EF 55%  Was Cardiac Rehab II ordered? (Includes Medically managed Patients): Yes  Was Smoking Cessation Advice provided?  No: N/A

## 2019-12-13 NOTE — Plan of Care (Signed)
  Problem: Education: Goal: Knowledge of General Education information will improve Description: Including pain rating scale, medication(s)/side effects and non-pharmacologic comfort measures Outcome: Progressing   Problem: Pain Managment: Goal: General experience of comfort will improve Outcome: Progressing   Problem: Safety: Goal: Ability to remain free from injury will improve Outcome: Progressing   

## 2019-12-13 NOTE — Clinical Social Work Note (Signed)
Pt was active with [facility] and OT. Pt will resume with [facility] Barbara Cower with [facility].  [place], [place] [phone]

## 2019-12-13 NOTE — Evaluation (Signed)
Physical Therapy Evaluation Patient Details Name: [name] MRN: [id number] DOB: [birth year] Today's Date: 12/13/2019   History of Present Illness  [name] is an 86 y/o female who was admitted for chest pain x 1 hour prior to arrival. PMH includes HTN, DM, CAD, several stent placements, recent breakthrough covid pneumonia July 2021, recent NSTEMI vs myocarditis July 2021, recent syncope admittion 2 weeks prior on 8/10 secondary to beta blocker induced bradycardia.  Clinical Impression  Pt received lying in bed and agreeable to participate in PT evaluation. Pt performed bed mobility with supervision for safety and sit <> stands with SBA/CGA for steadying and safety. Pt ambulated 90 feet using RW with CGA for steadying with ambulation distance limited by fatigue. Femoral site visually inspected before and after mobility and site is free of blood. Pt presents with deficits in strength, mobility, balance, and endurance and would benefit from skilled PT this acute stay to address. Recommend HHPT at discharge to optimize return to PLOF and maximize functional mobility.    Follow Up Recommendations Home health PT    Equipment Recommendations  None recommended by PT    Recommendations for Other Services       Precautions / Restrictions Precautions Precautions: Fall Restrictions Weight Bearing Restrictions: No      Mobility  Bed Mobility Overal bed mobility: Needs Assistance             General bed mobility comments: supervision for safety and requires increased time to perform  Transfers Overall transfer level: Needs assistance Equipment used: None;Rolling walker (2 wheeled) Transfers: Sit to/from Stand Sit to Stand: Min guard         General transfer comment: pt transferred from bed to [facility] with SBA then CGA for steadying; pt then performed sit <> stand with CGA for safety and use of RW  Ambulation/Gait Ambulation/Gait assistance: Min guard Gait Distance (Feet): 90  Feet Assistive device: Rolling walker (2 wheeled) Gait Pattern/deviations: Step-through pattern Gait velocity: decreased   General Gait Details: pt ambulated 90 feet using RW with CGA for steadying; reciprocal patten noted with slightly decreased step lengths bilaterally  Stairs            Wheelchair Mobility    Modified Rankin (Stroke Patients Only)       Balance Overall balance assessment: Needs assistance Sitting-balance support: Feet supported Sitting balance-Leahy Scale: Good Sitting balance - Comments: no overt LOB noted   Standing balance support: Bilateral upper extremity supported Standing balance-Leahy Scale: Good Standing balance comment: good static standing balance with BUE on RW with SBA/CGA for safety                             Pertinent Vitals/Pain Pain Assessment: No/denies pain    Home Living Family/patient expects to be discharged to:: Private residence Living Arrangements: Children;Other relatives Available Help at Discharge: Family;Available 24 hours/day Type of Home: House Home Access: Ramped entrance     Home Layout: One level Home Equipment: Walker - 2 wheels;Walker - 4 wheels;Shower seat;Shower seat - built in;Grab bars - tub/shower Additional Comments: Does not feel safe sitting on built in shower seat so utilizes shower bench    Prior Function Level of Independence: Independent with assistive device(s)         Comments: Reports ambulating with use of RW intermittently around house, stating "I try to be very careful; endorses 2 falls this year     Hand Dominance  Extremity/Trunk Assessment   Upper Extremity Assessment Upper Extremity Assessment: Generalized weakness (BUE grossly 4-/5)    Lower Extremity Assessment Lower Extremity Assessment: Generalized weakness (BLE grossly 4-/5)    Cervical / Trunk Assessment Cervical / Trunk Assessment: Kyphotic  Communication   Communication: No difficulties   Cognition Arousal/Alertness: Awake/alert Behavior During Therapy: WFL for tasks assessed/performed Overall Cognitive Status: Within Functional Limits for tasks assessed                                        General Comments      Exercises     Assessment/Plan    PT Assessment Patient needs continued PT services  PT Problem List Decreased strength;Decreased activity tolerance;Decreased balance;Decreased mobility;Decreased safety awareness       PT Treatment Interventions DME instruction;Gait training;Functional mobility training;Therapeutic activities;Therapeutic exercise;Balance training;Patient/family education    PT Goals (Current goals can be found in the Care Plan section)  Acute Rehab PT Goals Patient Stated Goal: to go home PT Goal Formulation: With patient Time For Goal Achievement: 12/27/19 Potential to Achieve Goals: Good    Frequency Min 2X/week   Barriers to discharge        Co-evaluation               AM-PAC PT "6 Clicks" Mobility  Outcome Measure Help needed turning from your back to your side while in a flat bed without using bedrails?: None Help needed moving from lying on your back to sitting on the side of a flat bed without using bedrails?: A Little Help needed moving to and from a bed to a chair (including a wheelchair)?: A Little Help needed standing up from a chair using your arms (e.g., wheelchair or bedside chair)?: A Little Help needed to walk in hospital room?: A Little Help needed climbing 3-5 steps with a railing? : A Little 6 Click Score: 19    End of Session Equipment Utilized During Treatment: Gait belt Activity Tolerance: Patient tolerated treatment well Patient left: in chair;with call bell/phone within reach;with chair alarm set Nurse Communication: Mobility status PT Visit Diagnosis: Unsteadiness on feet (R26.81);Other abnormalities of gait and mobility (R26.89);Muscle weakness (generalized) (M62.81);History  of falling (Z91.81)    Time: 0102-7253 PT Time Calculation (min) (ACUTE ONLY): 26 min   Charges:              Frederich Chick, SPT  Frederich Chick 12/13/2019, 1:17 PM

## 2020-01-25 ENCOUNTER — Encounter (INDEPENDENT_AMBULATORY_CARE_PROVIDER_SITE_OTHER): Payer: Medicare Other

## 2020-01-25 ENCOUNTER — Ambulatory Visit (INDEPENDENT_AMBULATORY_CARE_PROVIDER_SITE_OTHER): Payer: Medicare Other | Admitting: Vascular Surgery

## 2020-02-06 ENCOUNTER — Encounter (INDEPENDENT_AMBULATORY_CARE_PROVIDER_SITE_OTHER): Payer: Self-pay | Admitting: Nurse Practitioner

## 2020-02-06 ENCOUNTER — Ambulatory Visit (INDEPENDENT_AMBULATORY_CARE_PROVIDER_SITE_OTHER): Payer: Medicare Other

## 2020-02-06 ENCOUNTER — Other Ambulatory Visit: Payer: Self-pay

## 2020-02-06 ENCOUNTER — Ambulatory Visit (INDEPENDENT_AMBULATORY_CARE_PROVIDER_SITE_OTHER): Payer: Medicare Other | Admitting: Nurse Practitioner

## 2020-02-06 VITALS — BP 145/48 | HR 65 | Resp 16

## 2020-02-06 DIAGNOSIS — I6523 Occlusion and stenosis of bilateral carotid arteries: Secondary | ICD-10-CM | POA: Diagnosis not present

## 2020-02-06 DIAGNOSIS — E782 Mixed hyperlipidemia: Secondary | ICD-10-CM | POA: Diagnosis not present

## 2020-02-06 DIAGNOSIS — I10 Essential (primary) hypertension: Secondary | ICD-10-CM | POA: Diagnosis not present

## 2020-02-11 ENCOUNTER — Encounter (INDEPENDENT_AMBULATORY_CARE_PROVIDER_SITE_OTHER): Payer: Self-pay | Admitting: Nurse Practitioner

## 2020-02-11 NOTE — Progress Notes (Signed)
Subjective:    Patient ID: [name], female    DOB: Dec 28, 1930, 86 y.o.   MRN: [id number] Chief Complaint  Patient presents with  . Follow-up    ultrasound follow up    The patient is seen for follow up evaluation of carotid stenosis. The carotid stenosis followed by ultrasound.   The patient denies amaurosis fugax. There is no recent history of TIA symptoms or focal motor deficits. There is no prior documented CVA.  The patient is taking enteric-coated aspirin 81 mg daily.  There is no history of migraine headaches. There is no history of seizures.  The patient has a history of coronary artery disease, no recent episodes of angina or shortness of breath. The patient denies PAD or claudication symptoms. There is a history of hyperlipidemia which is being treated with a statin.    Carotid Duplex done today shows 40 to 59% right ICA stenosis with 1 to 39% left ICA stenosis.  No change compared to last study in 02/07/2019.   Review of Systems  Eyes: Negative for visual disturbance.  All other systems reviewed and are negative.      Objective:   Physical Exam Vitals reviewed.  HENT:     Head: Normocephalic.  Neck:     Vascular: No carotid bruit.  Cardiovascular:     Rate and Rhythm: Normal rate.     Pulses: Normal pulses.  Pulmonary:     Effort: Pulmonary effort is normal.  Neurological:     Mental Status: She is alert and oriented to person, place, and time.  Psychiatric:        Mood and Affect: Mood normal.        Behavior: Behavior normal.        Thought Content: Thought content normal.        Judgment: Judgment normal.     BP (!) 145/48 (BP Location: Left Arm)   Pulse 65   Resp 16   Past Medical History:  Diagnosis Date  . Hypercholesteremia   . Hypertension   . Myocardial infarction (HCC) 2008  . S/P angioplasty with stent     Social History   Socioeconomic History  . Marital status: Widowed    Spouse name: Not on file  . Number of  children: Not on file  . Years of education: Not on file  . Highest education level: Not on file  Occupational History  . Not on file  Tobacco Use  . Smoking status: Never Smoker  . Smokeless tobacco: Never Used  Vaping Use  . Vaping Use: Never used  Substance and Sexual Activity  . Alcohol use: No  . Drug use: No  . Sexual activity: Not on file  Other Topics Concern  . Not on file  Social History Narrative  . Not on file   Social Determinants of Health   Financial Resource Strain:   . Difficulty of Paying Living Expenses: Not on file  Food Insecurity:   . Worried About Programme researcher, broadcasting/film/video in the Last Year: Not on file  . Ran Out of Food in the Last Year: Not on file  Transportation Needs:   . Lack of Transportation (Medical): Not on file  . Lack of Transportation (Non-Medical): Not on file  Physical Activity:   . Days of Exercise per Week: Not on file  . Minutes of Exercise per Session: Not on file  Stress:   . Feeling of Stress : Not on file  Social Connections:   .  Frequency of Communication with Friends and Family: Not on file  . Frequency of Social Gatherings with Friends and Family: Not on file  . Attends Religious Services: Not on file  . Active Member of Clubs or Organizations: Not on file  . Attends Banker Meetings: Not on file  . Marital Status: Not on file  Intimate Partner Violence:   . Fear of Current or Ex-Partner: Not on file  . Emotionally Abused: Not on file  . Physically Abused: Not on file  . Sexually Abused: Not on file    Past Surgical History:  Procedure Laterality Date  . ABDOMINAL HYSTERECTOMY    . APPENDECTOMY    . CORONARY ANGIOPLASTY WITH STENT PLACEMENT    . CORONARY STENT INTERVENTION N/A 12/12/2019   Procedure: CORONARY STENT INTERVENTION;  Surgeon: Marcina Millard, MD;  Location: [facility] INVASIVE CV LAB;  Service: Cardiovascular;  Laterality: N/A;  . LEFT HEART CATH AND CORONARY ANGIOGRAPHY N/A 12/12/2019    Procedure: LEFT HEART CATH AND CORONARY ANGIOGRAPHY with possible PCI;  Surgeon: Marcina Millard, MD;  Location: [facility] INVASIVE CV LAB;  Service: Cardiovascular;  Laterality: N/A;    Family History  Problem Relation Age of Onset  . Breast cancer Maternal Aunt     Allergies  Allergen Reactions  . Butenafine Swelling    Tongue swelled and turned red  . Levofloxacin Swelling and Other (See Comments)    Other reaction(s): Other (See Comments) Tongue swelled and turned red  . Limonene Other (See Comments)    Other reaction(s): Other (See Comments) Tongue swelled and turned red Tongue swelled and turned red   . Penicillins     Other reaction(s): Unknown  . Terbinafine And Related Other (See Comments)    Tongue swelled and turned red       Assessment & Plan:   1. Bilateral carotid artery stenosis Recommend:  Given the patient's asymptomatic subcritical stenosis no further invasive testing or surgery at this time.  Duplex ultrasound shows less than 60% stenosis bilaterally.  Continue antiplatelet therapy as prescribed Continue management of CAD, HTN and Hyperlipidemia Healthy heart diet,  encouraged exercise at least 4 times per week Follow up in 12 months with duplex ultrasound and physical exam   2. Essential hypertension Continue antihypertensive medications as already ordered, these medications have been reviewed and there are no changes at this time.   3. Mixed hyperlipidemia Continue statin as ordered and reviewed, no changes at this time    Current Outpatient Medications on File Prior to Visit  Medication Sig Dispense Refill  . acetaminophen (TYLENOL) 500 MG tablet Take 500 mg by mouth every 6 (six) hours as needed.    Marland Kitchen amLODipine (NORVASC) 5 MG tablet Take 5 mg by mouth daily.    Marland Kitchen aspirin EC 81 MG tablet Take 81 mg by mouth daily.    . cloNIDine (CATAPRES - DOSED IN MG/24 HR) 0.1 mg/24hr patch 0.1 mg once a week.    . clopidogrel (PLAVIX) 75 MG tablet  Take 75 mg by mouth daily.    Marland Kitchen Dextromethorphan-guaiFENesin (MUCINEX DM PO) Take by mouth.    . donepezil (ARICEPT) 5 MG tablet Take 5 mg by mouth daily.    . fluticasone (FLONASE) 50 MCG/ACT nasal spray Place into both nostrils daily.    Marland Kitchen gabapentin (NEURONTIN) 100 MG capsule Take 100 mg by mouth 3 (three) times daily.     Marland Kitchen glimepiride (AMARYL) 1 MG tablet Take 1 mg by mouth daily with breakfast.    .  isosorbide mononitrate (IMDUR) 60 MG 24 hr tablet Take 60 mg by mouth daily.    . magnesium oxide (MAG-OX) 400 MG tablet Take 400 mg by mouth daily.     . metoprolol tartrate (LOPRESSOR) 25 MG tablet Take 25 mg by mouth 2 (two) times daily.    . Multiple Vitamins-Minerals (MULTIVITAMIN GUMMIES ADULT PO) Take by mouth.    . nitroGLYCERIN (NITROSTAT) 0.4 MG SL tablet Place 0.4 mg under the tongue every 5 (five) minutes as needed. For chest pain    . omeprazole (PRILOSEC) 20 MG capsule Take 20 mg by mouth daily.     . polyethylene glycol (MIRALAX / GLYCOLAX) 17 g packet Take 17 g by mouth daily as needed.    . rosuvastatin (CRESTOR) 20 MG tablet Take 20 mg by mouth daily.    Marland Kitchen spironolactone (ALDACTONE) 25 MG tablet Take 25 mg by mouth daily.    . timolol (TIMOPTIC) 0.5 % ophthalmic solution Place 1 drop into both eyes at bedtime.    Marland Kitchen tiZANidine (ZANAFLEX) 2 MG tablet Take 2 mg by mouth 3 (three) times daily.     Marland Kitchen torsemide (DEMADEX) 20 MG tablet Take 30 mg by mouth every evening.      No current facility-administered medications on file prior to visit.    There are no Patient Instructions on file for this visit. No follow-ups on file.   Georgiana Spinner, NP

## 2020-03-08 ENCOUNTER — Encounter: Payer: Medicare Other | Attending: Cardiology | Admitting: *Deleted

## 2020-03-08 ENCOUNTER — Other Ambulatory Visit: Payer: Self-pay

## 2020-03-08 DIAGNOSIS — I214 Non-ST elevation (NSTEMI) myocardial infarction: Secondary | ICD-10-CM

## 2020-03-08 DIAGNOSIS — Z955 Presence of coronary angioplasty implant and graft: Secondary | ICD-10-CM

## 2020-03-08 NOTE — Progress Notes (Signed)
Initial telephone orientation completed. Diagnosis can be found in [facility] 8/22. EP orientation scheduled for Monday, 11/29 at 1pm.

## 2020-03-18 ENCOUNTER — Other Ambulatory Visit: Payer: Self-pay

## 2020-03-18 VITALS — Ht 61.5 in | Wt 139.8 lb

## 2020-03-18 DIAGNOSIS — I214 Non-ST elevation (NSTEMI) myocardial infarction: Secondary | ICD-10-CM

## 2020-03-18 DIAGNOSIS — Z955 Presence of coronary angioplasty implant and graft: Secondary | ICD-10-CM

## 2020-03-18 NOTE — Progress Notes (Signed)
Cardiac Individual Treatment Plan  Patient Details  Name: [name] MRN: [id number] Date of Birth: 1930/08/15 Referring Provider:     Cardiac Rehab from 03/18/2020 in [facility] Cardiac and Pulmonary Rehab  Referring Provider Paraschos      Initial Encounter Date:    Cardiac Rehab from 03/18/2020 in [facility] Cardiac and Pulmonary Rehab  Date 03/18/20      Visit Diagnosis: NSTEMI (non-ST elevated myocardial infarction) [facility])  Status post coronary artery stent placement  Patient's Home Medications on Admission:  Current Outpatient Medications:  .  acetaminophen (TYLENOL) 500 MG tablet, Take 500 mg by mouth every 6 (six) hours as needed., Disp: , Rfl:  .  amLODipine (NORVASC) 5 MG tablet, Take 5 mg by mouth daily., Disp: , Rfl:  .  aspirin EC 81 MG tablet, Take 81 mg by mouth daily., Disp: , Rfl:  .  cloNIDine (CATAPRES - DOSED IN MG/24 HR) 0.1 mg/24hr patch, 0.1 mg once a week., Disp: , Rfl:  .  clopidogrel (PLAVIX) 75 MG tablet, Take 75 mg by mouth daily., Disp: , Rfl:  .  Dextromethorphan-guaiFENesin (MUCINEX DM PO), Take by mouth., Disp: , Rfl:  .  donepezil (ARICEPT) 5 MG tablet, Take 5 mg by mouth daily., Disp: , Rfl:  .  fluticasone (FLONASE) 50 MCG/ACT nasal spray, Place into both nostrils daily., Disp: , Rfl:  .  gabapentin (NEURONTIN) 100 MG capsule, Take 100 mg by mouth 3 (three) times daily. , Disp: , Rfl:  .  glimepiride (AMARYL) 1 MG tablet, Take 1 mg by mouth daily with breakfast. (Patient not taking: Reported on 03/08/2020), Disp: , Rfl:  .  isosorbide mononitrate (IMDUR) 60 MG 24 hr tablet, Take 60 mg by mouth daily., Disp: , Rfl:  .  magnesium oxide (MAG-OX) 400 MG tablet, Take 400 mg by mouth daily. , Disp: , Rfl:  .  metoprolol tartrate (LOPRESSOR) 25 MG tablet, Take 25 mg by mouth 2 (two) times daily. (Patient not taking: Reported on 03/08/2020), Disp: , Rfl:  .  Multiple Vitamins-Minerals (MULTIVITAMIN GUMMIES ADULT PO), Take by mouth., Disp: , Rfl:  .  nitroGLYCERIN  (NITROSTAT) 0.4 MG SL tablet, Place 0.4 mg under the tongue every 5 (five) minutes as needed. For chest pain, Disp: , Rfl:  .  omeprazole (PRILOSEC) 20 MG capsule, Take 20 mg by mouth daily. , Disp: , Rfl:  .  polyethylene glycol (MIRALAX / GLYCOLAX) 17 g packet, Take 17 g by mouth daily as needed. (Patient not taking: Reported on 03/08/2020), Disp: , Rfl:  .  rosuvastatin (CRESTOR) 20 MG tablet, Take 20 mg by mouth daily., Disp: , Rfl:  .  spironolactone (ALDACTONE) 25 MG tablet, Take 25 mg by mouth daily., Disp: , Rfl:  .  timolol (TIMOPTIC) 0.5 % ophthalmic solution, Place 1 drop into both eyes at bedtime., Disp: , Rfl:  .  tiZANidine (ZANAFLEX) 2 MG tablet, Take 2 mg by mouth 3 (three) times daily.  (Patient not taking: Reported on 03/08/2020), Disp: , Rfl:  .  torsemide (DEMADEX) 20 MG tablet, Take 30 mg by mouth every evening. , Disp: , Rfl:   Past Medical History: Past Medical History:  Diagnosis Date  . Hypercholesteremia   . Hypertension   . Myocardial infarction (HCC) 2008  . S/P angioplasty with stent     Tobacco Use: Social History   Tobacco Use  Smoking Status Never Smoker  Smokeless Tobacco Never Used    Labs: Recent Review Contractor for  [facility] Cardiac and Pulmonary Rehab Latest Ref Rng & Units 12/08/2011 08/15/2012 11/18/2019   Cholestrol 0 - 200 mg/dL - 354 656   LDLCALC 0 - 99 mg/dL - 81 812(X)   HDL >51 mg/dL - 70(Y) 52   Trlycerides <150 mg/dL - 174(B) 92   Hemoglobin A1c 4.8 - 5.6 % - 7.5(H) 6.5(H)   TCO2 0 - 100 mmol/L 26 - -       Exercise Target Goals: Exercise Program Goal: Individual exercise prescription set using results from initial 6 min walk test and THRR while considering  patient's activity barriers and safety.   Exercise Prescription Goal: Initial exercise prescription builds to 30-45 minutes a day of aerobic activity, 2-3 days per week.  Home exercise guidelines will be given to patient during program as part of exercise  prescription that the participant will acknowledge.   Education: Aerobic Exercise: - Group verbal and visual presentation on the components of exercise prescription. Introduces F.I.T.T principle from ACSM for exercise prescriptions.  Reviews F.I.T.T. principles of aerobic exercise including progression. Written material given at graduation.   Education: Resistance Exercise: - Group verbal and visual presentation on the components of exercise prescription. Introduces F.I.T.T principle from ACSM for exercise prescriptions  Reviews F.I.T.T. principles of resistance exercise including progression. Written material given at graduation.    Education: Exercise & Equipment Safety: - Individual verbal instruction and demonstration of equipment use and safety with use of the equipment.   Education: Exercise Physiology & General Exercise Guidelines: - Group verbal and written instruction with models to review the exercise physiology of the cardiovascular system and associated critical values. Provides general exercise guidelines with specific guidelines to those with heart or lung disease.    Education: Flexibility, Balance, Mind/Body Relaxation: - Group verbal and visual presentation with interactive activity on the components of exercise prescription. Introduces F.I.T.T principle from ACSM for exercise prescriptions. Reviews F.I.T.T. principles of flexibility and balance exercise training including progression. Also discusses the mind body connection.  Reviews various relaxation techniques to help reduce and manage stress (i.e. Deep breathing, progressive muscle relaxation, and visualization). Balance handout provided to take home. Written material given at graduation.   Activity Barriers & Risk Stratification:  Activity Barriers & Cardiac Risk Stratification - 03/08/20 1518      Activity Barriers & Cardiac Risk Stratification   Activity Barriers Assistive Device;Back Problems;Neck/Spine  Problems;Muscular Weakness    Cardiac Risk Stratification High           6 Minute Walk:  6 Minute Walk    Row Name 03/18/20 1531         6 Minute Walk   Phase Initial     Distance 765 feet     Walk Time 5.5 minutes     # of Rest Breaks 1     MPH 1.58     METS 1.07     RPE 13     Perceived Dyspnea  1     VO2 Peak 3.75     Symptoms Yes (comment)     Comments leg fatigue     Resting HR 64 bpm     Resting BP 138/50     Resting Oxygen Saturation  100 %     Exercise Oxygen Saturation  during 6 min walk 99 %     Max Ex. HR 91 bpm     Max Ex. BP 154/50     2 Minute Post BP 124/50  Oxygen Initial Assessment:   Oxygen Re-Evaluation:   Oxygen Discharge (Final Oxygen Re-Evaluation):   Initial Exercise Prescription:  Initial Exercise Prescription - 03/18/20 1500      Date of Initial Exercise RX and Referring Provider   Date 03/18/20    Referring Provider Paraschos      Treadmill   MPH 1    Grade 0    Minutes 15    METs 1.77      Recumbant Bike   Level 1    RPM 60    Minutes 15    METs 1      NuStep   Level 1    SPM 80    Minutes 15    METs 1      REL-XR   Level 1    Speed 50    Minutes 15    METs 1      Biostep-RELP   Level 1    SPM 50    Minutes 15    METs 1      Prescription Details   Frequency (times per week) 3    Duration Progress to 30 minutes of continuous aerobic without signs/symptoms of physical distress      Intensity   THRR 40-80% of Max Heartrate 92-119    Ratings of Perceived Exertion 11-15    Perceived Dyspnea 0-4      Resistance Training   Training Prescription Yes    Weight 3 lb    Reps 10-15           Perform Capillary Blood Glucose checks as needed.  Exercise Prescription Changes:  Exercise Prescription Changes    Row Name 03/18/20 1500             Response to Exercise   Blood Pressure (Admit) 138/50       Blood Pressure (Exercise) 154/50       Blood Pressure (Exit) 124/50       Heart  Rate (Admit) 64 bpm       Heart Rate (Exercise) 91 bpm       Heart Rate (Exit) 64 bpm       Oxygen Saturation (Admit) 100 %       Oxygen Saturation (Exercise) 99 %       Rating of Perceived Exertion (Exercise) 13       Perceived Dyspnea (Exercise) 1       Symptoms leg fatigue              Exercise Comments:   Exercise Goals and Review:  Exercise Goals    Row Name 03/18/20 1537             Exercise Goals   Increase Physical Activity Yes       Intervention Provide advice, education, support and counseling about physical activity/exercise needs.;Develop an individualized exercise prescription for aerobic and resistive training based on initial evaluation findings, risk stratification, comorbidities and participant's personal goals.       Expected Outcomes Short Term: Attend rehab on a regular basis to increase amount of physical activity.;Long Term: Add in home exercise to make exercise part of routine and to increase amount of physical activity.;Long Term: Exercising regularly at least 3-5 days a week.       Increase Strength and Stamina Yes       Intervention Provide advice, education, support and counseling about physical activity/exercise needs.;Develop an individualized exercise prescription for aerobic and resistive training based on initial evaluation findings, risk stratification, comorbidities and  participant's personal goals.       Expected Outcomes Short Term: Increase workloads from initial exercise prescription for resistance, speed, and METs.;Short Term: Perform resistance training exercises routinely during rehab and add in resistance training at home;Long Term: Improve cardiorespiratory fitness, muscular endurance and strength as measured by increased METs and functional capacity ( )       Able to understand and use rate of perceived exertion (RPE) scale Yes       Intervention Provide education and explanation on how to use RPE scale       Expected Outcomes Short  Term: Able to use RPE daily in rehab to express subjective intensity level;Long Term:  Able to use RPE to guide intensity level when exercising independently       Able to understand and use Dyspnea scale Yes       Intervention Provide education and explanation on how to use Dyspnea scale       Expected Outcomes Short Term: Able to use Dyspnea scale daily in rehab to express subjective sense of shortness of breath during exertion;Long Term: Able to use Dyspnea scale to guide intensity level when exercising independently       Knowledge and understanding of Target Heart Rate Range (THRR) Yes       Intervention Provide education and explanation of THRR including how the numbers were predicted and where they are located for reference       Expected Outcomes Short Term: Able to state/look up THRR;Short Term: Able to use daily as guideline for intensity in rehab;Long Term: Able to use THRR to govern intensity when exercising independently       Able to check pulse independently Yes       Intervention Provide education and demonstration on how to check pulse in carotid and radial arteries.;Review the importance of being able to check your own pulse for safety during independent exercise       Expected Outcomes Short Term: Able to explain why pulse checking is important during independent exercise;Long Term: Able to check pulse independently and accurately       Understanding of Exercise Prescription Yes       Intervention Provide education, explanation, and written materials on patient's individual exercise prescription       Expected Outcomes Short Term: Able to explain program exercise prescription;Long Term: Able to explain home exercise prescription to exercise independently              Exercise Goals Re-Evaluation :   Discharge Exercise Prescription (Final Exercise Prescription Changes):  Exercise Prescription Changes - 03/18/20 1500      Response to Exercise   Blood Pressure (Admit) 138/50     Blood Pressure (Exercise) 154/50    Blood Pressure (Exit) 124/50    Heart Rate (Admit) 64 bpm    Heart Rate (Exercise) 91 bpm    Heart Rate (Exit) 64 bpm    Oxygen Saturation (Admit) 100 %    Oxygen Saturation (Exercise) 99 %    Rating of Perceived Exertion (Exercise) 13    Perceived Dyspnea (Exercise) 1    Symptoms leg fatigue           Nutrition:  Target Goals: Understanding of nutrition guidelines, daily intake of sodium 1500mg , cholesterol 200mg , calories 30% from fat and 7% or less from saturated fats, daily to have 5 or more servings of fruits and vegetables.  Education: All About Nutrition: -Group instruction provided by verbal, written material, interactive activities, discussions, models, and posters to  present general guidelines for heart healthy nutrition including fat, fiber, MyPlate, the role of sodium in heart healthy nutrition, utilization of the nutrition label, and utilization of this knowledge for meal planning. Follow up email sent as well. Written material given at graduation.   Biometrics:  Pre Biometrics - 03/18/20 1540      Pre Biometrics   Height 5' 1.5" (1.562 m)    Weight 139 lb 12.8 oz (63.4 kg)    BMI (Calculated) 25.99            Nutrition Therapy Plan and Nutrition Goals:   Nutrition Assessments:  MEDIFICTS Score Key:  ?70 Need to make dietary changes   40-70 Heart Healthy Diet  ? 40 Therapeutic Level Cholesterol Diet    Cardiac Rehab from 03/18/2020 in [facility] Cardiac and Pulmonary Rehab  Picture Your Plate Total Score on Admission 54     Picture Your Plate Scores:  <33 Unhealthy dietary pattern with much room for improvement.  41-50 Dietary pattern unlikely to meet recommendations for good health and room for improvement.  51-60 More healthful dietary pattern, with some room for improvement.   >60 Healthy dietary pattern, although there may be some specific behaviors that could be improved.    Nutrition Goals  Re-Evaluation:   Nutrition Goals Discharge (Final Nutrition Goals Re-Evaluation):   Psychosocial: Target Goals: Acknowledge presence or absence of significant depression and/or stress, maximize coping skills, provide positive support system. Participant is able to verbalize types and ability to use techniques and skills needed for reducing stress and depression.   Education: Stress, Anxiety, and Depression - Group verbal and visual presentation to define topics covered.  Reviews how body is impacted by stress, anxiety, and depression.  Also discusses healthy ways to reduce stress and to treat/manage anxiety and depression.  Written material given at graduation.   Education: Sleep Hygiene -Provides group verbal and written instruction about how sleep can affect your health.  Define sleep hygiene, discuss sleep cycles and impact of sleep habits. Review good sleep hygiene tips.    Initial Review & Psychosocial Screening:  Initial Psych Review & Screening - 03/08/20 1521      Initial Review   Current issues with Current Stress Concerns    Source of Stress Concerns Unable to perform yard/household activities;Chronic Illness      Family Dynamics   Good Support System? Yes   son, sister     Barriers   Psychosocial barriers to participate in program There are no identifiable barriers or psychosocial needs.      Screening Interventions   Interventions Encouraged to exercise;Provide feedback about the scores to participant;To provide support and resources with identified psychosocial needs    Expected Outcomes Short Term goal: Utilizing psychosocial counselor, staff and physician to assist with identification of specific Stressors or current issues interfering with healing process. Setting desired goal for each stressor or current issue identified.;Long Term Goal: Stressors or current issues are controlled or eliminated.;Short Term goal: Identification and review with participant of any Quality  of Life or Depression concerns found by scoring the questionnaire.;Long Term goal: The participant improves quality of Life and PHQ9 Scores as seen by post scores and/or verbalization of changes           Quality of Life Scores:   Quality of Life - 03/18/20 1544      Quality of Life   Select Quality of Life      Quality of Life Scores   Health/Function Pre 26.4 %  Socioeconomic Pre 26 %    Psych/Spiritual Pre 26.57 %    Family Pre 28.5 %    GLOBAL Pre 26.66 %          Scores of 19 and below usually indicate a poorer quality of life in these areas.  A difference of  2-3 points is a clinically meaningful difference.  A difference of 2-3 points in the total score of the Quality of Life Index has been associated with significant improvement in overall quality of life, self-image, physical symptoms, and general health in studies assessing change in quality of life.  PHQ-9: Recent Review Flowsheet Data    Depression screen [facility] 2/9 03/18/2020   Decreased Interest 1   Down, Depressed, Hopeless 0   PHQ - 2 Score 1   Altered sleeping 0   Tired, decreased energy 1   Change in appetite 0   Feeling bad or failure about yourself  0   Trouble concentrating 0   Moving slowly or fidgety/restless 0   Suicidal thoughts 0   PHQ-9 Score 2   Difficult doing work/chores Not difficult at all     Interpretation of Total Score  Total Score Depression Severity:  1-4 = Minimal depression, 5-9 = Mild depression, 10-14 = Moderate depression, 15-19 = Moderately severe depression, 20-27 = Severe depression   Psychosocial Evaluation and Intervention:  Psychosocial Evaluation - 03/08/20 1528      Psychosocial Evaluation & Interventions   Comments Ms. Lainy MI was in June. Since then, she has completed home health physical therapy. However, she still feels weak at times and relies on her walker to stand up straight. She really wants to get stronger and help reduce some of her back pain. Her sister  and especially her son are her support system and help her around the house. She states she doesn't have "mental stress" but her physical deconditioning gets her down sometimes    Expected Outcomes Short: attend cardiac rehab for education and exercise. Long: develop positive self care habits.    Continue Psychosocial Services  Follow up required by staff           Psychosocial Re-Evaluation:   Psychosocial Discharge (Final Psychosocial Re-Evaluation):   Vocational Rehabilitation: Provide vocational rehab assistance to qualifying candidates.   Vocational Rehab Evaluation & Intervention:  Vocational Rehab - 03/08/20 1521      Initial Vocational Rehab Evaluation & Intervention   Assessment shows need for Vocational Rehabilitation No           Education: Education Goals: Education classes will be provided on a variety of topics geared toward better understanding of heart health and risk factor modification. Participant will state understanding/return demonstration of topics presented as noted by education test scores.  Learning Barriers/Preferences:  Learning Barriers/Preferences - 03/08/20 1520      Learning Barriers/Preferences   Learning Barriers None    Learning Preferences None           General Cardiac Education Topics:  AED/CPR: - Group verbal and written instruction with the use of models to demonstrate the basic use of the AED with the basic ABC's of resuscitation.   Anatomy and Cardiac Procedures: - Group verbal and visual presentation and models provide information about basic cardiac anatomy and function. Reviews the testing methods done to diagnose heart disease and the outcomes of the test results. Describes the treatment choices: Medical Management, Angioplasty, or Coronary Bypass Surgery for treating various heart conditions including Myocardial Infarction, Angina, Valve Disease, and Cardiac  Arrhythmias.  Written material given at  graduation.   Medication Safety: - Group verbal and visual instruction to review commonly prescribed medications for heart and lung disease. Reviews the medication, class of the drug, and side effects. Includes the steps to properly store meds and maintain the prescription regimen.  Written material given at graduation.   Intimacy: - Group verbal instruction through game format to discuss how heart and lung disease can affect sexual intimacy. Written material given at graduation..   Know Your Numbers and Heart Failure: - Group verbal and visual instruction to discuss disease risk factors for cardiac and pulmonary disease and treatment options.  Reviews associated critical values for Overweight/Obesity, Hypertension, Cholesterol, and Diabetes.  Discusses basics of heart failure: signs/symptoms and treatments.  Introduces Heart Failure Zone chart for action plan for heart failure.  Written material given at graduation.   Infection Prevention: - Provides verbal and written material to individual with discussion of infection control including proper hand washing and proper equipment cleaning during exercise session.   Falls Prevention: - Provides verbal and written material to individual with discussion of falls prevention and safety.   Other: -Provides group and verbal instruction on various topics (see comments)   Knowledge Questionnaire Score:  Knowledge Questionnaire Score - 03/18/20 1545      Knowledge Questionnaire Score   Pre Score 26/26           Core Components/Risk Factors/Patient Goals at Admission:  Personal Goals and Risk Factors at Admission - 03/18/20 1542      Core Components/Risk Factors/Patient Goals on Admission    Weight Management Weight Maintenance    Diabetes Yes    Intervention Provide education about signs/symptoms and action to take for hypo/hyperglycemia.    Expected Outcomes Short Term: Participant verbalizes understanding of the signs/symptoms and  immediate care of hyper/hypoglycemia, proper foot care and importance of medication, aerobic/resistive exercise and nutrition plan for blood glucose control.;Long Term: Attainment of HbA1C < 7%.    Hypertension Yes    Intervention Provide education on lifestyle modifcations including regular physical activity/exercise, weight management, moderate sodium restriction and increased consumption of fresh fruit, vegetables, and low fat dairy, alcohol moderation, and smoking cessation.;Monitor prescription use compliance.    Lipids Yes    Intervention Provide education and support for participant on nutrition & aerobic/resistive exercise along with prescribed medications to achieve LDL 70mg , HDL >40mg .    Expected Outcomes Short Term: Participant states understanding of desired cholesterol values and is compliant with medications prescribed. Participant is following exercise prescription and nutrition guidelines.;Long Term: Cholesterol controlled with medications as prescribed, with individualized exercise RX and with personalized nutrition plan. Value goals: LDL < 70mg , HDL > 40 mg.           Education:Diabetes - Individual verbal and written instruction to review signs/symptoms of diabetes, desired ranges of glucose level fasting, after meals and with exercise. Acknowledge that pre and post exercise glucose checks will be done for 3 sessions at entry of program.   Cardiac Rehab from 03/08/2020 in [facility] Cardiac and Pulmonary Rehab  Date 03/08/20  Educator [facility]  Instruction Review Code 1- Verbalizes Understanding  [diet controlled]      Core Components/Risk Factors/Patient Goals Review:    Core Components/Risk Factors/Patient Goals at Discharge (Final Review):    ITP Comments:  ITP Comments    Row Name 03/08/20 1526           ITP Comments Initial telephone orientation completed. Diagnosis can be found in [facility] 8/22.  EP orientation scheduled for Monday, 11/29 at 1pm.              Comments:  initial ITP

## 2020-03-18 NOTE — Patient Instructions (Signed)
Patient Instructions  Patient Details  Name: [name] MRN: [id number] Date of Birth: July 21, 1930 Referring Provider:  Marcina Millard, MD  Below are your personal goals for exercise, nutrition, and risk factors. Our goal is to help you stay on track towards obtaining and maintaining these goals. We will be discussing your progress on these goals with you throughout the program.  Initial Exercise Prescription:  Initial Exercise Prescription - 03/18/20 1500      Date of Initial Exercise RX and Referring Provider   Date 03/18/20    Referring Provider Paraschos      Treadmill   MPH 1    Grade 0    Minutes 15    METs 1.77      Recumbant Bike   Level 1    RPM 60    Minutes 15    METs 1      NuStep   Level 1    SPM 80    Minutes 15    METs 1      REL-XR   Level 1    Speed 50    Minutes 15    METs 1      Biostep-RELP   Level 1    SPM 50    Minutes 15    METs 1      Prescription Details   Frequency (times per week) 3    Duration Progress to 30 minutes of continuous aerobic without signs/symptoms of physical distress      Intensity   THRR 40-80% of Max Heartrate 92-119    Ratings of Perceived Exertion 11-15    Perceived Dyspnea 0-4      Resistance Training   Training Prescription Yes    Weight 3 lb    Reps 10-15           Exercise Goals: Frequency: Be able to perform aerobic exercise two to three times per week in program working toward 2-5 days per week of home exercise.  Intensity: Work with a perceived exertion of 11 (fairly light) - 15 (hard) while following your exercise prescription.  We will make changes to your prescription with you as you progress through the program.   Duration: Be able to do 30 to 45 minutes of continuous aerobic exercise in addition to a 5 minute warm-up and a 5 minute cool-down routine.   Nutrition Goals: Your personal nutrition goals will be established when you do your nutrition analysis with the dietician.  The  following are general nutrition guidelines to follow: Cholesterol < 200mg /day Sodium < 1500mg /day Fiber: Women over 50 yrs - 21 grams per day  Personal Goals:  Personal Goals and Risk Factors at Admission - 03/18/20 1542      Core Components/Risk Factors/Patient Goals on Admission    Weight Management Weight Maintenance    Diabetes Yes    Intervention Provide education about signs/symptoms and action to take for hypo/hyperglycemia.    Expected Outcomes Short Term: Participant verbalizes understanding of the signs/symptoms and immediate care of hyper/hypoglycemia, proper foot care and importance of medication, aerobic/resistive exercise and nutrition plan for blood glucose control.;Long Term: Attainment of HbA1C < 7%.    Hypertension Yes    Intervention Provide education on lifestyle modifcations including regular physical activity/exercise, weight management, moderate sodium restriction and increased consumption of fresh fruit, vegetables, and low fat dairy, alcohol moderation, and smoking cessation.;Monitor prescription use compliance.    Lipids Yes    Intervention Provide education and support for participant on nutrition &  aerobic/resistive exercise along with prescribed medications to achieve LDL 70mg , HDL >40mg .    Expected Outcomes Short Term: Participant states understanding of desired cholesterol values and is compliant with medications prescribed. Participant is following exercise prescription and nutrition guidelines.;Long Term: Cholesterol controlled with medications as prescribed, with individualized exercise RX and with personalized nutrition plan. Value goals: LDL < 70mg , HDL > 40 mg.           Tobacco Use Initial Evaluation: Social History   Tobacco Use  Smoking Status Never Smoker  Smokeless Tobacco Never Used    Exercise Goals and Review:  Exercise Goals    Row Name 03/18/20 1537             Exercise Goals   Increase Physical Activity Yes       Intervention  Provide advice, education, support and counseling about physical activity/exercise needs.;Develop an individualized exercise prescription for aerobic and resistive training based on initial evaluation findings, risk stratification, comorbidities and participant's personal goals.       Expected Outcomes Short Term: Attend rehab on a regular basis to increase amount of physical activity.;Long Term: Add in home exercise to make exercise part of routine and to increase amount of physical activity.;Long Term: Exercising regularly at least 3-5 days a week.       Increase Strength and Stamina Yes       Intervention Provide advice, education, support and counseling about physical activity/exercise needs.;Develop an individualized exercise prescription for aerobic and resistive training based on initial evaluation findings, risk stratification, comorbidities and participant's personal goals.       Expected Outcomes Short Term: Increase workloads from initial exercise prescription for resistance, speed, and METs.;Short Term: Perform resistance training exercises routinely during rehab and add in resistance training at home;Long Term: Improve cardiorespiratory fitness, muscular endurance and strength as measured by increased METs and functional capacity (03/20/20)       Able to understand and use rate of perceived exertion (RPE) scale Yes       Intervention Provide education and explanation on how to use RPE scale       Expected Outcomes Short Term: Able to use RPE daily in rehab to express subjective intensity level;Long Term:  Able to use RPE to guide intensity level when exercising independently       Able to understand and use Dyspnea scale Yes       Intervention Provide education and explanation on how to use Dyspnea scale       Expected Outcomes Short Term: Able to use Dyspnea scale daily in rehab to express subjective sense of shortness of breath during exertion;Long Term: Able to use Dyspnea scale to guide  intensity level when exercising independently       Knowledge and understanding of Target Heart Rate Range (THRR) Yes       Intervention Provide education and explanation of THRR including how the numbers were predicted and where they are located for reference       Expected Outcomes Short Term: Able to state/look up THRR;Short Term: Able to use daily as guideline for intensity in rehab;Long Term: Able to use THRR to govern intensity when exercising independently       Able to check pulse independently Yes       Intervention Provide education and demonstration on how to check pulse in carotid and radial arteries.;Review the importance of being able to check your own pulse for safety during independent exercise       Expected Outcomes  Short Term: Able to explain why pulse checking is important during independent exercise;Long Term: Able to check pulse independently and accurately       Understanding of Exercise Prescription Yes       Intervention Provide education, explanation, and written materials on patient's individual exercise prescription       Expected Outcomes Short Term: Able to explain program exercise prescription;Long Term: Able to explain home exercise prescription to exercise independently              Copy of goals given to participant.

## 2020-03-20 ENCOUNTER — Encounter: Payer: Self-pay | Admitting: *Deleted

## 2020-03-20 ENCOUNTER — Encounter: Payer: Medicare Other | Attending: Cardiology

## 2020-03-20 ENCOUNTER — Other Ambulatory Visit: Payer: Self-pay

## 2020-03-20 DIAGNOSIS — I214 Non-ST elevation (NSTEMI) myocardial infarction: Secondary | ICD-10-CM

## 2020-03-20 DIAGNOSIS — Z955 Presence of coronary angioplasty implant and graft: Secondary | ICD-10-CM

## 2020-03-20 LAB — GLUCOSE, CAPILLARY
Glucose-Capillary: 105 mg/dL — ABNORMAL HIGH (ref 70–99)
Glucose-Capillary: 96 mg/dL (ref 70–99)

## 2020-03-20 NOTE — Progress Notes (Signed)
Daily Session Note  Patient Details  Name: [name] MRN: [id number] Date of Birth: [birth year] Referring Provider:     Cardiac Rehab from 03/18/2020 in [facility]  Referring Provider Paraschos      Encounter Date: 03/20/2020  Check In:  Session Check In - 03/20/20 1551      Check-In   Supervising physician immediately available to respond to emergencies See telemetry face sheet for immediately available ER MD    Location [facility]iac & Pul[facility]    Staff Present Birdie Sons, MPA, Elveria Rising, BA, ACSM CEP, Exercise Physiologist;Melissa Caiola RDN, LDN    Virtual Visit No    Medication changes reported     No    Fall or balance concerns reported    No    Warm-up and Cool-down Performed on first and last piece of equipment    Resistance Training Performed Yes    VAD Patient? No    PAD/SET Patient? No      Pain Assessment   Currently in Pain? No/denies              Social History   Tobacco Use  Smoking Status Never Smoker  Smokeless Tobacco Never Used    Goals Met:  Independence with exercise equipment Exercise tolerated well No report of cardiac concerns or symptoms Strength training completed today  Goals Unmet:  Not Applicable  Comments: First full day of exercise!  Patient was oriented to gym and equipment including functions, settings, policies, and procedures.  Patient's individual exercise prescription and treatment plan were reviewed.  All starting workloads were established based on the results of the 6 minute walk test done at initial orientation visit.  The plan for exercise progression was also introduced and progression will be customized based on patient's performance and goals.    Dr. Emily Filbert is Medical Director for [place] [facility].

## 2020-03-20 NOTE — Progress Notes (Signed)
Cardiac Individual Treatment Plan  Patient Details  Name: [name] MRN: [id number] Date of Birth: 1930/08/15 Referring Provider:     Cardiac Rehab from 03/18/2020 in [facility] Cardiac and Pulmonary Rehab  Referring Provider Paraschos      Initial Encounter Date:    Cardiac Rehab from 03/18/2020 in [facility] Cardiac and Pulmonary Rehab  Date 03/18/20      Visit Diagnosis: NSTEMI (non-ST elevated myocardial infarction) [facility])  Status post coronary artery stent placement  Patient's Home Medications on Admission:  Current Outpatient Medications:  .  acetaminophen (TYLENOL) 500 MG tablet, Take 500 mg by mouth every 6 (six) hours as needed., Disp: , Rfl:  .  amLODipine (NORVASC) 5 MG tablet, Take 5 mg by mouth daily., Disp: , Rfl:  .  aspirin EC 81 MG tablet, Take 81 mg by mouth daily., Disp: , Rfl:  .  cloNIDine (CATAPRES - DOSED IN MG/24 HR) 0.1 mg/24hr patch, 0.1 mg once a week., Disp: , Rfl:  .  clopidogrel (PLAVIX) 75 MG tablet, Take 75 mg by mouth daily., Disp: , Rfl:  .  Dextromethorphan-guaiFENesin (MUCINEX DM PO), Take by mouth., Disp: , Rfl:  .  donepezil (ARICEPT) 5 MG tablet, Take 5 mg by mouth daily., Disp: , Rfl:  .  fluticasone (FLONASE) 50 MCG/ACT nasal spray, Place into both nostrils daily., Disp: , Rfl:  .  gabapentin (NEURONTIN) 100 MG capsule, Take 100 mg by mouth 3 (three) times daily. , Disp: , Rfl:  .  glimepiride (AMARYL) 1 MG tablet, Take 1 mg by mouth daily with breakfast. (Patient not taking: Reported on 03/08/2020), Disp: , Rfl:  .  isosorbide mononitrate (IMDUR) 60 MG 24 hr tablet, Take 60 mg by mouth daily., Disp: , Rfl:  .  magnesium oxide (MAG-OX) 400 MG tablet, Take 400 mg by mouth daily. , Disp: , Rfl:  .  metoprolol tartrate (LOPRESSOR) 25 MG tablet, Take 25 mg by mouth 2 (two) times daily. (Patient not taking: Reported on 03/08/2020), Disp: , Rfl:  .  Multiple Vitamins-Minerals (MULTIVITAMIN GUMMIES ADULT PO), Take by mouth., Disp: , Rfl:  .  nitroGLYCERIN  (NITROSTAT) 0.4 MG SL tablet, Place 0.4 mg under the tongue every 5 (five) minutes as needed. For chest pain, Disp: , Rfl:  .  omeprazole (PRILOSEC) 20 MG capsule, Take 20 mg by mouth daily. , Disp: , Rfl:  .  polyethylene glycol (MIRALAX / GLYCOLAX) 17 g packet, Take 17 g by mouth daily as needed. (Patient not taking: Reported on 03/08/2020), Disp: , Rfl:  .  rosuvastatin (CRESTOR) 20 MG tablet, Take 20 mg by mouth daily., Disp: , Rfl:  .  spironolactone (ALDACTONE) 25 MG tablet, Take 25 mg by mouth daily., Disp: , Rfl:  .  timolol (TIMOPTIC) 0.5 % ophthalmic solution, Place 1 drop into both eyes at bedtime., Disp: , Rfl:  .  tiZANidine (ZANAFLEX) 2 MG tablet, Take 2 mg by mouth 3 (three) times daily.  (Patient not taking: Reported on 03/08/2020), Disp: , Rfl:  .  torsemide (DEMADEX) 20 MG tablet, Take 30 mg by mouth every evening. , Disp: , Rfl:   Past Medical History: Past Medical History:  Diagnosis Date  . Hypercholesteremia   . Hypertension   . Myocardial infarction (HCC) 2008  . S/P angioplasty with stent     Tobacco Use: Social History   Tobacco Use  Smoking Status Never Smoker  Smokeless Tobacco Never Used    Labs: Recent Review Contractor for  [facility] Cardiac and Pulmonary Rehab Latest Ref Rng & Units 12/08/2011 08/15/2012 11/18/2019   Cholestrol 0 - 200 mg/dL - 354 656   LDLCALC 0 - 99 mg/dL - 81 812(X)   HDL >51 mg/dL - 70(Y) 52   Trlycerides <150 mg/dL - 174(B) 92   Hemoglobin A1c 4.8 - 5.6 % - 7.5(H) 6.5(H)   TCO2 0 - 100 mmol/L 26 - -       Exercise Target Goals: Exercise Program Goal: Individual exercise prescription set using results from initial 6 min walk test and THRR while considering  patient's activity barriers and safety.   Exercise Prescription Goal: Initial exercise prescription builds to 30-45 minutes a day of aerobic activity, 2-3 days per week.  Home exercise guidelines will be given to patient during program as part of exercise  prescription that the participant will acknowledge.   Education: Aerobic Exercise: - Group verbal and visual presentation on the components of exercise prescription. Introduces F.I.T.T principle from ACSM for exercise prescriptions.  Reviews F.I.T.T. principles of aerobic exercise including progression. Written material given at graduation.   Education: Resistance Exercise: - Group verbal and visual presentation on the components of exercise prescription. Introduces F.I.T.T principle from ACSM for exercise prescriptions  Reviews F.I.T.T. principles of resistance exercise including progression. Written material given at graduation.    Education: Exercise & Equipment Safety: - Individual verbal instruction and demonstration of equipment use and safety with use of the equipment.   Education: Exercise Physiology & General Exercise Guidelines: - Group verbal and written instruction with models to review the exercise physiology of the cardiovascular system and associated critical values. Provides general exercise guidelines with specific guidelines to those with heart or lung disease.    Education: Flexibility, Balance, Mind/Body Relaxation: - Group verbal and visual presentation with interactive activity on the components of exercise prescription. Introduces F.I.T.T principle from ACSM for exercise prescriptions. Reviews F.I.T.T. principles of flexibility and balance exercise training including progression. Also discusses the mind body connection.  Reviews various relaxation techniques to help reduce and manage stress (i.e. Deep breathing, progressive muscle relaxation, and visualization). Balance handout provided to take home. Written material given at graduation.   Activity Barriers & Risk Stratification:  Activity Barriers & Cardiac Risk Stratification - 03/08/20 1518      Activity Barriers & Cardiac Risk Stratification   Activity Barriers Assistive Device;Back Problems;Neck/Spine  Problems;Muscular Weakness    Cardiac Risk Stratification High           6 Minute Walk:  6 Minute Walk    Row Name 03/18/20 1531         6 Minute Walk   Phase Initial     Distance 765 feet     Walk Time 5.5 minutes     # of Rest Breaks 1     MPH 1.58     METS 1.07     RPE 13     Perceived Dyspnea  1     VO2 Peak 3.75     Symptoms Yes (comment)     Comments leg fatigue     Resting HR 64 bpm     Resting BP 138/50     Resting Oxygen Saturation  100 %     Exercise Oxygen Saturation  during 6 min walk 99 %     Max Ex. HR 91 bpm     Max Ex. BP 154/50     2 Minute Post BP 124/50  Oxygen Initial Assessment:   Oxygen Re-Evaluation:   Oxygen Discharge (Final Oxygen Re-Evaluation):   Initial Exercise Prescription:  Initial Exercise Prescription - 03/18/20 1500      Date of Initial Exercise RX and Referring Provider   Date 03/18/20    Referring Provider Paraschos      Treadmill   MPH 1    Grade 0    Minutes 15    METs 1.77      Recumbant Bike   Level 1    RPM 60    Minutes 15    METs 1      NuStep   Level 1    SPM 80    Minutes 15    METs 1      REL-XR   Level 1    Speed 50    Minutes 15    METs 1      Biostep-RELP   Level 1    SPM 50    Minutes 15    METs 1      Prescription Details   Frequency (times per week) 3    Duration Progress to 30 minutes of continuous aerobic without signs/symptoms of physical distress      Intensity   THRR 40-80% of Max Heartrate 92-119    Ratings of Perceived Exertion 11-15    Perceived Dyspnea 0-4      Resistance Training   Training Prescription Yes    Weight 3 lb    Reps 10-15           Perform Capillary Blood Glucose checks as needed.  Exercise Prescription Changes:  Exercise Prescription Changes    Row Name 03/18/20 1500             Response to Exercise   Blood Pressure (Admit) 138/50       Blood Pressure (Exercise) 154/50       Blood Pressure (Exit) 124/50       Heart  Rate (Admit) 64 bpm       Heart Rate (Exercise) 91 bpm       Heart Rate (Exit) 64 bpm       Oxygen Saturation (Admit) 100 %       Oxygen Saturation (Exercise) 99 %       Rating of Perceived Exertion (Exercise) 13       Perceived Dyspnea (Exercise) 1       Symptoms leg fatigue              Exercise Comments:   Exercise Goals and Review:  Exercise Goals    Row Name 03/18/20 1537             Exercise Goals   Increase Physical Activity Yes       Intervention Provide advice, education, support and counseling about physical activity/exercise needs.;Develop an individualized exercise prescription for aerobic and resistive training based on initial evaluation findings, risk stratification, comorbidities and participant's personal goals.       Expected Outcomes Short Term: Attend rehab on a regular basis to increase amount of physical activity.;Long Term: Add in home exercise to make exercise part of routine and to increase amount of physical activity.;Long Term: Exercising regularly at least 3-5 days a week.       Increase Strength and Stamina Yes       Intervention Provide advice, education, support and counseling about physical activity/exercise needs.;Develop an individualized exercise prescription for aerobic and resistive training based on initial evaluation findings, risk stratification, comorbidities and  participant's personal goals.       Expected Outcomes Short Term: Increase workloads from initial exercise prescription for resistance, speed, and METs.;Short Term: Perform resistance training exercises routinely during rehab and add in resistance training at home;Long Term: Improve cardiorespiratory fitness, muscular endurance and strength as measured by increased METs and functional capacity ( )       Able to understand and use rate of perceived exertion (RPE) scale Yes       Intervention Provide education and explanation on how to use RPE scale       Expected Outcomes Short  Term: Able to use RPE daily in rehab to express subjective intensity level;Long Term:  Able to use RPE to guide intensity level when exercising independently       Able to understand and use Dyspnea scale Yes       Intervention Provide education and explanation on how to use Dyspnea scale       Expected Outcomes Short Term: Able to use Dyspnea scale daily in rehab to express subjective sense of shortness of breath during exertion;Long Term: Able to use Dyspnea scale to guide intensity level when exercising independently       Knowledge and understanding of Target Heart Rate Range (THRR) Yes       Intervention Provide education and explanation of THRR including how the numbers were predicted and where they are located for reference       Expected Outcomes Short Term: Able to state/look up THRR;Short Term: Able to use daily as guideline for intensity in rehab;Long Term: Able to use THRR to govern intensity when exercising independently       Able to check pulse independently Yes       Intervention Provide education and demonstration on how to check pulse in carotid and radial arteries.;Review the importance of being able to check your own pulse for safety during independent exercise       Expected Outcomes Short Term: Able to explain why pulse checking is important during independent exercise;Long Term: Able to check pulse independently and accurately       Understanding of Exercise Prescription Yes       Intervention Provide education, explanation, and written materials on patient's individual exercise prescription       Expected Outcomes Short Term: Able to explain program exercise prescription;Long Term: Able to explain home exercise prescription to exercise independently              Exercise Goals Re-Evaluation :   Discharge Exercise Prescription (Final Exercise Prescription Changes):  Exercise Prescription Changes - 03/18/20 1500      Response to Exercise   Blood Pressure (Admit) 138/50     Blood Pressure (Exercise) 154/50    Blood Pressure (Exit) 124/50    Heart Rate (Admit) 64 bpm    Heart Rate (Exercise) 91 bpm    Heart Rate (Exit) 64 bpm    Oxygen Saturation (Admit) 100 %    Oxygen Saturation (Exercise) 99 %    Rating of Perceived Exertion (Exercise) 13    Perceived Dyspnea (Exercise) 1    Symptoms leg fatigue           Nutrition:  Target Goals: Understanding of nutrition guidelines, daily intake of sodium 1500mg , cholesterol 200mg , calories 30% from fat and 7% or less from saturated fats, daily to have 5 or more servings of fruits and vegetables.  Education: All About Nutrition: -Group instruction provided by verbal, written material, interactive activities, discussions, models, and posters to  present general guidelines for heart healthy nutrition including fat, fiber, MyPlate, the role of sodium in heart healthy nutrition, utilization of the nutrition label, and utilization of this knowledge for meal planning. Follow up email sent as well. Written material given at graduation.   Biometrics:  Pre Biometrics - 03/18/20 1540      Pre Biometrics   Height 5' 1.5" (1.562 m)    Weight 139 lb 12.8 oz (63.4 kg)    BMI (Calculated) 25.99            Nutrition Therapy Plan and Nutrition Goals:   Nutrition Assessments:  MEDIFICTS Score Key:  ?70 Need to make dietary changes   40-70 Heart Healthy Diet  ? 40 Therapeutic Level Cholesterol Diet    Cardiac Rehab from 03/18/2020 in [facility] Cardiac and Pulmonary Rehab  Picture Your Plate Total Score on Admission 54     Picture Your Plate Scores:  <33 Unhealthy dietary pattern with much room for improvement.  41-50 Dietary pattern unlikely to meet recommendations for good health and room for improvement.  51-60 More healthful dietary pattern, with some room for improvement.   >60 Healthy dietary pattern, although there may be some specific behaviors that could be improved.    Nutrition Goals  Re-Evaluation:   Nutrition Goals Discharge (Final Nutrition Goals Re-Evaluation):   Psychosocial: Target Goals: Acknowledge presence or absence of significant depression and/or stress, maximize coping skills, provide positive support system. Participant is able to verbalize types and ability to use techniques and skills needed for reducing stress and depression.   Education: Stress, Anxiety, and Depression - Group verbal and visual presentation to define topics covered.  Reviews how body is impacted by stress, anxiety, and depression.  Also discusses healthy ways to reduce stress and to treat/manage anxiety and depression.  Written material given at graduation.   Education: Sleep Hygiene -Provides group verbal and written instruction about how sleep can affect your health.  Define sleep hygiene, discuss sleep cycles and impact of sleep habits. Review good sleep hygiene tips.    Initial Review & Psychosocial Screening:  Initial Psych Review & Screening - 03/08/20 1521      Initial Review   Current issues with Current Stress Concerns    Source of Stress Concerns Unable to perform yard/household activities;Chronic Illness      Family Dynamics   Good Support System? Yes   son, sister     Barriers   Psychosocial barriers to participate in program There are no identifiable barriers or psychosocial needs.      Screening Interventions   Interventions Encouraged to exercise;Provide feedback about the scores to participant;To provide support and resources with identified psychosocial needs    Expected Outcomes Short Term goal: Utilizing psychosocial counselor, staff and physician to assist with identification of specific Stressors or current issues interfering with healing process. Setting desired goal for each stressor or current issue identified.;Long Term Goal: Stressors or current issues are controlled or eliminated.;Short Term goal: Identification and review with participant of any Quality  of Life or Depression concerns found by scoring the questionnaire.;Long Term goal: The participant improves quality of Life and PHQ9 Scores as seen by post scores and/or verbalization of changes           Quality of Life Scores:   Quality of Life - 03/18/20 1544      Quality of Life   Select Quality of Life      Quality of Life Scores   Health/Function Pre 26.4 %  Socioeconomic Pre 26 %    Psych/Spiritual Pre 26.57 %    Family Pre 28.5 %    GLOBAL Pre 26.66 %          Scores of 19 and below usually indicate a poorer quality of life in these areas.  A difference of  2-3 points is a clinically meaningful difference.  A difference of 2-3 points in the total score of the Quality of Life Index has been associated with significant improvement in overall quality of life, self-image, physical symptoms, and general health in studies assessing change in quality of life.  PHQ-9: Recent Review Flowsheet Data    Depression screen [facility] 2/9 03/18/2020   Decreased Interest 1   Down, Depressed, Hopeless 0   PHQ - 2 Score 1   Altered sleeping 0   Tired, decreased energy 1   Change in appetite 0   Feeling bad or failure about yourself  0   Trouble concentrating 0   Moving slowly or fidgety/restless 0   Suicidal thoughts 0   PHQ-9 Score 2   Difficult doing work/chores Not difficult at all     Interpretation of Total Score  Total Score Depression Severity:  1-4 = Minimal depression, 5-9 = Mild depression, 10-14 = Moderate depression, 15-19 = Moderately severe depression, 20-27 = Severe depression   Psychosocial Evaluation and Intervention:  Psychosocial Evaluation - 03/08/20 1528      Psychosocial Evaluation & Interventions   Comments Ms. Lainy MI was in June. Since then, she has completed home health physical therapy. However, she still feels weak at times and relies on her walker to stand up straight. She really wants to get stronger and help reduce some of her back pain. Her sister  and especially her son are her support system and help her around the house. She states she doesn't have "mental stress" but her physical deconditioning gets her down sometimes    Expected Outcomes Short: attend cardiac rehab for education and exercise. Long: develop positive self care habits.    Continue Psychosocial Services  Follow up required by staff           Psychosocial Re-Evaluation:   Psychosocial Discharge (Final Psychosocial Re-Evaluation):   Vocational Rehabilitation: Provide vocational rehab assistance to qualifying candidates.   Vocational Rehab Evaluation & Intervention:  Vocational Rehab - 03/08/20 1521      Initial Vocational Rehab Evaluation & Intervention   Assessment shows need for Vocational Rehabilitation No           Education: Education Goals: Education classes will be provided on a variety of topics geared toward better understanding of heart health and risk factor modification. Participant will state understanding/return demonstration of topics presented as noted by education test scores.  Learning Barriers/Preferences:  Learning Barriers/Preferences - 03/08/20 1520      Learning Barriers/Preferences   Learning Barriers None    Learning Preferences None           General Cardiac Education Topics:  AED/CPR: - Group verbal and written instruction with the use of models to demonstrate the basic use of the AED with the basic ABC's of resuscitation.   Anatomy and Cardiac Procedures: - Group verbal and visual presentation and models provide information about basic cardiac anatomy and function. Reviews the testing methods done to diagnose heart disease and the outcomes of the test results. Describes the treatment choices: Medical Management, Angioplasty, or Coronary Bypass Surgery for treating various heart conditions including Myocardial Infarction, Angina, Valve Disease, and Cardiac  Arrhythmias.  Written material given at  graduation.   Medication Safety: - Group verbal and visual instruction to review commonly prescribed medications for heart and lung disease. Reviews the medication, class of the drug, and side effects. Includes the steps to properly store meds and maintain the prescription regimen.  Written material given at graduation.   Intimacy: - Group verbal instruction through game format to discuss how heart and lung disease can affect sexual intimacy. Written material given at graduation..   Know Your Numbers and Heart Failure: - Group verbal and visual instruction to discuss disease risk factors for cardiac and pulmonary disease and treatment options.  Reviews associated critical values for Overweight/Obesity, Hypertension, Cholesterol, and Diabetes.  Discusses basics of heart failure: signs/symptoms and treatments.  Introduces Heart Failure Zone chart for action plan for heart failure.  Written material given at graduation.   Infection Prevention: - Provides verbal and written material to individual with discussion of infection control including proper hand washing and proper equipment cleaning during exercise session.   Falls Prevention: - Provides verbal and written material to individual with discussion of falls prevention and safety.   Other: -Provides group and verbal instruction on various topics (see comments)   Knowledge Questionnaire Score:  Knowledge Questionnaire Score - 03/18/20 1545      Knowledge Questionnaire Score   Pre Score 26/26           Core Components/Risk Factors/Patient Goals at Admission:  Personal Goals and Risk Factors at Admission - 03/18/20 1542      Core Components/Risk Factors/Patient Goals on Admission    Weight Management Weight Maintenance    Diabetes Yes    Intervention Provide education about signs/symptoms and action to take for hypo/hyperglycemia.    Expected Outcomes Short Term: Participant verbalizes understanding of the signs/symptoms and  immediate care of hyper/hypoglycemia, proper foot care and importance of medication, aerobic/resistive exercise and nutrition plan for blood glucose control.;Long Term: Attainment of HbA1C < 7%.    Hypertension Yes    Intervention Provide education on lifestyle modifcations including regular physical activity/exercise, weight management, moderate sodium restriction and increased consumption of fresh fruit, vegetables, and low fat dairy, alcohol moderation, and smoking cessation.;Monitor prescription use compliance.    Lipids Yes    Intervention Provide education and support for participant on nutrition & aerobic/resistive exercise along with prescribed medications to achieve LDL 70mg , HDL >40mg .    Expected Outcomes Short Term: Participant states understanding of desired cholesterol values and is compliant with medications prescribed. Participant is following exercise prescription and nutrition guidelines.;Long Term: Cholesterol controlled with medications as prescribed, with individualized exercise RX and with personalized nutrition plan. Value goals: LDL < 70mg , HDL > 40 mg.           Education:Diabetes - Individual verbal and written instruction to review signs/symptoms of diabetes, desired ranges of glucose level fasting, after meals and with exercise. Acknowledge that pre and post exercise glucose checks will be done for 3 sessions at entry of program.   Cardiac Rehab from 03/08/2020 in [facility] Cardiac and Pulmonary Rehab  Date 03/08/20  Educator [facility]  Instruction Review Code 1- Verbalizes Understanding  [diet controlled]      Core Components/Risk Factors/Patient Goals Review:    Core Components/Risk Factors/Patient Goals at Discharge (Final Review):    ITP Comments:  ITP Comments    Row Name 03/08/20 1526 03/18/20 1553 03/20/20 1035       ITP Comments Initial telephone orientation completed. Diagnosis can be found in [facility] 8/22.  EP orientation scheduled for Monday, 11/29 at 1pm.  Completed and gym orientation. Initial ITP created and sent for review to Dr. Bethann Punches, Medical Director. 30 Day review completed. Medical Director ITP review done, changes made as directed, and signed approval by Medical Director.  New to program            Comments:

## 2020-03-21 ENCOUNTER — Encounter: Payer: Medicare Other | Admitting: *Deleted

## 2020-03-21 DIAGNOSIS — Z955 Presence of coronary angioplasty implant and graft: Secondary | ICD-10-CM

## 2020-03-21 DIAGNOSIS — I214 Non-ST elevation (NSTEMI) myocardial infarction: Secondary | ICD-10-CM | POA: Diagnosis not present

## 2020-03-21 LAB — GLUCOSE, CAPILLARY
Glucose-Capillary: 134 mg/dL — ABNORMAL HIGH (ref 70–99)
Glucose-Capillary: 119 mg/dL — ABNORMAL HIGH (ref 70–99)

## 2020-03-21 NOTE — Progress Notes (Signed)
Daily Session Note  Patient Details  Name: [name] MRN: [id number] Date of Birth: 31-Aug-1930 Referring Provider:     Cardiac Rehab from 03/18/2020 in [facility] Cardiac and Pulmonary Rehab  Referring Provider Paraschos      Encounter Date: 03/21/2020  Check In:  Session Check In - 03/21/20 1557      Check-In   Supervising physician immediately available to respond to emergencies See telemetry face sheet for immediately available ER MD    Location [facility]-Cardiac & Pulmonary Rehab    Staff Present Renita Papa, RN BSN;Joseph Lou Miner, [place] Exercise Physiologist    Virtual Visit No    Medication changes reported     No    Fall or balance concerns reported    No    Warm-up and Cool-down Performed on first and last piece of equipment    Resistance Training Performed Yes    VAD Patient? No    PAD/SET Patient? No      Pain Assessment   Currently in Pain? No/denies              Social History   Tobacco Use  Smoking Status Never Smoker  Smokeless Tobacco Never Used    Goals Met:  Independence with exercise equipment Exercise tolerated well No report of cardiac concerns or symptoms Strength training completed today  Goals Unmet:  Not Applicable  Comments: Pt able to follow exercise prescription today without complaint.  Will continue to monitor for progression.    Dr. Emily Filbert is Medical Director for [place] and [facility].

## 2020-03-28 ENCOUNTER — Other Ambulatory Visit: Payer: Self-pay

## 2020-03-28 ENCOUNTER — Encounter: Payer: Medicare Other | Admitting: *Deleted

## 2020-03-28 DIAGNOSIS — I214 Non-ST elevation (NSTEMI) myocardial infarction: Secondary | ICD-10-CM | POA: Diagnosis not present

## 2020-03-28 DIAGNOSIS — Z955 Presence of coronary angioplasty implant and graft: Secondary | ICD-10-CM

## 2020-03-28 LAB — GLUCOSE, CAPILLARY
Glucose-Capillary: 108 mg/dL — ABNORMAL HIGH (ref 70–99)
Glucose-Capillary: 109 mg/dL — ABNORMAL HIGH (ref 70–99)

## 2020-03-28 NOTE — Progress Notes (Signed)
Daily Session Note  Patient Details  Name: [name] MRN: [id number] Date of Birth: 1930-09-05 Referring Provider:   Flowsheet Row Cardiac Rehab from 03/18/2020 in [facility] Cardiac and Pulmonary Rehab  Referring Provider Paraschos      Encounter Date: 03/28/2020  Check In:  Session Check In - 03/28/20 1543      Check-In   Supervising physician immediately available to respond to emergencies See telemetry face sheet for immediately available ER MD    Location [facility]-Cardiac & Pulmonary Rehab    Staff Present Renita Papa, RN BSN;Joseph [address] [place], [place], Miller, CCRP, CCET    Virtual Visit No    Medication changes reported     No    Fall or balance concerns reported    No    Warm-up and Cool-down Performed on first and last piece of equipment    Resistance Training Performed Yes    VAD Patient? No    PAD/SET Patient? No      Pain Assessment   Currently in Pain? No/denies              Social History   Tobacco Use  Smoking Status Never Smoker  Smokeless Tobacco Never Used    Goals Met:  Independence with exercise equipment Exercise tolerated well No report of cardiac concerns or symptoms Strength training completed today  Goals Unmet:  Not Applicable  Comments: Pt able to follow exercise prescription today without complaint.  Will continue to monitor for progression.    Dr. Emily Filbert is Medical Director for [place] and [facility].

## 2020-04-01 ENCOUNTER — Other Ambulatory Visit: Payer: Self-pay

## 2020-04-01 DIAGNOSIS — I214 Non-ST elevation (NSTEMI) myocardial infarction: Secondary | ICD-10-CM

## 2020-04-01 DIAGNOSIS — Z955 Presence of coronary angioplasty implant and graft: Secondary | ICD-10-CM

## 2020-04-01 NOTE — Progress Notes (Signed)
Daily Session Note  Patient Details  Name: [name] MRN: [id number] Date of Birth: Apr 13, 1931 Referring Provider:   Flowsheet Row Cardiac Rehab from 03/18/2020 in [facility] Cardiac and Pulmonary Rehab  Referring Provider Paraschos      Encounter Date: 04/01/2020  Check In:  Session Check In - 04/01/20 [place]      Check-In   Supervising physician immediately available to respond to emergencies See telemetry face sheet for immediately available ER MD    Location [facility]-Cardiac & Pulmonary Rehab    Staff Present Birdie Sons, MPA, Mauricia Area, BS, ACSM CEP, Exercise Physiologist;Kara Eliezer Bottom, MS Exercise Physiologist    Virtual Visit No    Medication changes reported     No    Fall or balance concerns reported    No    Warm-up and Cool-down Performed on first and last piece of equipment    Resistance Training Performed Yes    VAD Patient? No    PAD/SET Patient? No      Pain Assessment   Currently in Pain? No/denies              Social History   Tobacco Use  Smoking Status Never Smoker  Smokeless Tobacco Never Used    Goals Met:  Independence with exercise equipment Exercise tolerated well No report of cardiac concerns or symptoms Strength training completed today  Goals Unmet:  Not Applicable  Comments: Pt able to follow exercise prescription today without complaint.  Will continue to monitor for progression.    Dr. Emily Filbert is Medical Director for [place] and [facility].

## 2020-04-08 ENCOUNTER — Other Ambulatory Visit: Payer: Self-pay

## 2020-04-08 DIAGNOSIS — Z955 Presence of coronary angioplasty implant and graft: Secondary | ICD-10-CM

## 2020-04-08 DIAGNOSIS — I214 Non-ST elevation (NSTEMI) myocardial infarction: Secondary | ICD-10-CM

## 2020-04-08 NOTE — Progress Notes (Signed)
Daily Session Note  Patient Details  Name: [name] MRN: [id number] Date of Birth: 07/05/1930 Referring Provider:   Flowsheet Row Cardiac Rehab from 03/18/2020 in [facility] Cardiac and Pulmonary Rehab  Referring Provider Paraschos      Encounter Date: 04/08/2020  Check In:  Session Check In - 04/08/20 1552      Check-In   Supervising physician immediately available to respond to emergencies See telemetry face sheet for immediately available ER MD    Location [facility]-Cardiac & Pulmonary Rehab    Staff Present  , MPA, RN; Hayes, BS, ACSM CEP, Exercise Physiologist;Amanda Sommer, BA, ACSM CEP, Exercise Physiologist;Kara Langdon, MS Exercise Physiologist    Virtual Visit No    Medication changes reported     Yes    Comments increased torsamide from 1 tablet to 1.5 tablets    Fall or balance concerns reported    No    Warm-up and Cool-down Performed on first and last piece of equipment    Resistance Training Performed Yes    VAD Patient? No    PAD/SET Patient? No      Pain Assessment   Currently in Pain? No/denies              Social History   Tobacco Use  Smoking Status Never Smoker  Smokeless Tobacco Never Used    Goals Met:  Independence with exercise equipment Exercise tolerated well Personal goals reviewed No report of cardiac concerns or symptoms Strength training completed today  Goals Unmet:  Not Applicable  Comments: Pt able to follow exercise prescription today without complaint.  Will continue to monitor for progression.    Dr. Mark Miller is Medical Director for [facility] and [facility]. 

## 2020-04-11 ENCOUNTER — Encounter: Payer: Medicare Other | Admitting: *Deleted

## 2020-04-11 ENCOUNTER — Other Ambulatory Visit: Payer: Self-pay

## 2020-04-11 DIAGNOSIS — Z955 Presence of coronary angioplasty implant and graft: Secondary | ICD-10-CM

## 2020-04-11 DIAGNOSIS — I214 Non-ST elevation (NSTEMI) myocardial infarction: Secondary | ICD-10-CM

## 2020-04-11 NOTE — Progress Notes (Signed)
Daily Session Note  Patient Details  Name: [name] MRN: [id number] Date of Birth: 1930/07/19 Referring Provider:   Flowsheet Row Cardiac Rehab from 03/18/2020 in [facility] Cardiac and Pulmonary Rehab  Referring Provider Paraschos      Encounter Date: 04/11/2020  Check In:  Session Check In - 04/11/20 [place]      Check-In   Supervising physician immediately available to respond to emergencies See telemetry face sheet for immediately available ER MD    Location [facility]-Cardiac & Pulmonary Rehab    Staff Present Renita Papa, RN BSN;Melissa Caiola RDN, LDN;Jessica Luan Pulling, MA, RCEP, CCRP, CCET    Virtual Visit No    Medication changes reported     No    Fall or balance concerns reported    No    Warm-up and Cool-down Performed on first and last piece of equipment    Resistance Training Performed Yes    VAD Patient? No    PAD/SET Patient? No      Pain Assessment   Currently in Pain? No/denies              Social History   Tobacco Use  Smoking Status Never Smoker  Smokeless Tobacco Never Used    Goals Met:  Independence with exercise equipment Exercise tolerated well No report of cardiac concerns or symptoms Strength training completed today  Goals Unmet:  Not Applicable  Comments: Pt able to follow exercise prescription today without complaint.  Will continue to monitor for progression.    Dr. Emily Filbert is Medical Director for [place] and [facility].

## 2020-04-15 ENCOUNTER — Encounter: Payer: Medicare Other | Admitting: *Deleted

## 2020-04-15 ENCOUNTER — Other Ambulatory Visit: Payer: Self-pay

## 2020-04-15 DIAGNOSIS — I214 Non-ST elevation (NSTEMI) myocardial infarction: Secondary | ICD-10-CM

## 2020-04-15 DIAGNOSIS — Z955 Presence of coronary angioplasty implant and graft: Secondary | ICD-10-CM

## 2020-04-15 NOTE — Progress Notes (Signed)
Daily Session Note  Patient Details  Name: [name] MRN: [id number] Date of Birth: [birth year] Referring Provider:   Flowsheet Row Cardiac Rehab from 03/18/2020 in [facility] Cardiac and Pulmonary Rehab  Referring Provider Paraschos      Encounter Date: 04/15/2020  Check In:  Session Check In - 04/15/20 [place]      Check-In   Supervising physician immediately available to respond to emergencies See telemetry face sheet for immediately available ER MD    Location [facility]-Cardiac & Pulmonary Rehab    Staff Present Renita Papa, RN BSN;Laureen Owens Shark, BS, RRT, CPFT;Amanda Oletta Darter, BA, ACSM CEP, Exercise Physiologist    Virtual Visit No    Medication changes reported     No    Warm-up and Cool-down Performed on first and last piece of equipment    Resistance Training Performed Yes    VAD Patient? No    PAD/SET Patient? No      Pain Assessment   Currently in Pain? No/denies              Social History   Tobacco Use  Smoking Status Never Smoker  Smokeless Tobacco Never Used    Goals Met:  Independence with exercise equipment Exercise tolerated well No report of cardiac concerns or symptoms Strength training completed today  Goals Unmet:  Not Applicable  Comments: Pt able to follow exercise prescription today without complaint.  Will continue to monitor for progression.    Dr. Emily Filbert is Medical Director for [place] and [facility].

## 2020-04-17 ENCOUNTER — Encounter: Payer: Self-pay | Admitting: *Deleted

## 2020-04-17 ENCOUNTER — Encounter: Payer: Medicare Other | Admitting: *Deleted

## 2020-04-17 ENCOUNTER — Other Ambulatory Visit: Payer: Self-pay

## 2020-04-17 DIAGNOSIS — I214 Non-ST elevation (NSTEMI) myocardial infarction: Secondary | ICD-10-CM

## 2020-04-17 DIAGNOSIS — Z955 Presence of coronary angioplasty implant and graft: Secondary | ICD-10-CM

## 2020-04-17 NOTE — Progress Notes (Signed)
Cardiac Individual Treatment Plan  Patient Details  Name: [name] MRN: [id number] Date of Birth: 05-06-1930 Referring Provider:   Flowsheet Row Cardiac Rehab from 03/18/2020 in [facility] Cardiac and Pulmonary Rehab  Referring Provider Paraschos      Initial Encounter Date:  Flowsheet Row Cardiac Rehab from 03/18/2020 in [facility] Cardiac and Pulmonary Rehab  Date 03/18/20      Visit Diagnosis: NSTEMI (non-ST elevated myocardial infarction) [facility])  Status post coronary artery stent placement  Patient's Home Medications on Admission:  Current Outpatient Medications:  .  acetaminophen (TYLENOL) 500 MG tablet, Take 500 mg by mouth every 6 (six) hours as needed., Disp: , Rfl:  .  amLODipine (NORVASC) 5 MG tablet, Take 5 mg by mouth daily., Disp: , Rfl:  .  aspirin EC 81 MG tablet, Take 81 mg by mouth daily., Disp: , Rfl:  .  cloNIDine (CATAPRES - DOSED IN MG/24 HR) 0.1 mg/24hr patch, 0.1 mg once a week., Disp: , Rfl:  .  clopidogrel (PLAVIX) 75 MG tablet, Take 75 mg by mouth daily., Disp: , Rfl:  .  Dextromethorphan-guaiFENesin (MUCINEX DM PO), Take by mouth., Disp: , Rfl:  .  donepezil (ARICEPT) 5 MG tablet, Take 5 mg by mouth daily., Disp: , Rfl:  .  fluticasone (FLONASE) 50 MCG/ACT nasal spray, Place into both nostrils daily., Disp: , Rfl:  .  gabapentin (NEURONTIN) 100 MG capsule, Take 100 mg by mouth 3 (three) times daily. , Disp: , Rfl:  .  glimepiride (AMARYL) 1 MG tablet, Take 1 mg by mouth daily with breakfast. (Patient not taking: Reported on 03/08/2020), Disp: , Rfl:  .  isosorbide mononitrate (IMDUR) 60 MG 24 hr tablet, Take 60 mg by mouth daily., Disp: , Rfl:  .  magnesium oxide (MAG-OX) 400 MG tablet, Take 400 mg by mouth daily. , Disp: , Rfl:  .  metoprolol tartrate (LOPRESSOR) 25 MG tablet, Take 25 mg by mouth 2 (two) times daily. (Patient not taking: Reported on 03/08/2020), Disp: , Rfl:  .  Multiple Vitamins-Minerals (MULTIVITAMIN GUMMIES ADULT PO), Take by mouth., Disp: ,  Rfl:  .  nitroGLYCERIN (NITROSTAT) 0.4 MG SL tablet, Place 0.4 mg under the tongue every 5 (five) minutes as needed. For chest pain, Disp: , Rfl:  .  omeprazole (PRILOSEC) 20 MG capsule, Take 20 mg by mouth daily. , Disp: , Rfl:  .  polyethylene glycol (MIRALAX / GLYCOLAX) 17 g packet, Take 17 g by mouth daily as needed. (Patient not taking: Reported on 03/08/2020), Disp: , Rfl:  .  rosuvastatin (CRESTOR) 20 MG tablet, Take 20 mg by mouth daily., Disp: , Rfl:  .  spironolactone (ALDACTONE) 25 MG tablet, Take 25 mg by mouth daily., Disp: , Rfl:  .  timolol (TIMOPTIC) 0.5 % ophthalmic solution, Place 1 drop into both eyes at bedtime., Disp: , Rfl:  .  tiZANidine (ZANAFLEX) 2 MG tablet, Take 2 mg by mouth 3 (three) times daily.  (Patient not taking: Reported on 03/08/2020), Disp: , Rfl:  .  torsemide (DEMADEX) 20 MG tablet, Take 30 mg by mouth every evening. , Disp: , Rfl:   Past Medical History: Past Medical History:  Diagnosis Date  . Hypercholesteremia   . Hypertension   . Myocardial infarction (HCC) 2008  . S/P angioplasty with stent     Tobacco Use: Social History   Tobacco Use  Smoking Status Never Smoker  Smokeless Tobacco Never Used    Labs: Recent Review Contractor for  [facility] Cardiac and Pulmonary Rehab Latest Ref Rng & Units 12/08/2011 08/15/2012 11/18/2019   Cholestrol 0 - 200 mg/dL - 161 096   LDLCALC 0 - 99 mg/dL - 81 045(W)   HDL >09 mg/dL - 81(X) 52   Trlycerides <150 mg/dL - 914(N) 92   Hemoglobin A1c 4.8 - 5.6 % - 7.5(H) 6.5(H)   TCO2 0 - 100 mmol/L 26 - -       Exercise Target Goals: Exercise Program Goal: Individual exercise prescription set using results from initial 6 min walk test and THRR while considering  patient's activity barriers and safety.   Exercise Prescription Goal: Initial exercise prescription builds to 30-45 minutes a day of aerobic activity, 2-3 days per week.  Home exercise guidelines will be given to patient during program as part  of exercise prescription that the participant will acknowledge.   Education: Aerobic Exercise: - Group verbal and visual presentation on the components of exercise prescription. Introduces F.I.T.T principle from ACSM for exercise prescriptions.  Reviews F.I.T.T. principles of aerobic exercise including progression. Written material given at graduation.   Education: Resistance Exercise: - Group verbal and visual presentation on the components of exercise prescription. Introduces F.I.T.T principle from ACSM for exercise prescriptions  Reviews F.I.T.T. principles of resistance exercise including progression. Written material given at graduation.    Education: Exercise & Equipment Safety: - Individual verbal instruction and demonstration of equipment use and safety with use of the equipment.   Education: Exercise Physiology & General Exercise Guidelines: - Group verbal and written instruction with models to review the exercise physiology of the cardiovascular system and associated critical values. Provides general exercise guidelines with specific guidelines to those with heart or lung disease.    Education: Flexibility, Balance, Mind/Body Relaxation: - Group verbal and visual presentation with interactive activity on the components of exercise prescription. Introduces F.I.T.T principle from ACSM for exercise prescriptions. Reviews F.I.T.T. principles of flexibility and balance exercise training including progression. Also discusses the mind body connection.  Reviews various relaxation techniques to help reduce and manage stress (i.e. Deep breathing, progressive muscle relaxation, and visualization). Balance handout provided to take home. Written material given at graduation.   Activity Barriers & Risk Stratification:  Activity Barriers & Cardiac Risk Stratification - 03/08/20 1518      Activity Barriers & Cardiac Risk Stratification   Activity Barriers Assistive Device;Back  Problems;Neck/Spine Problems;Muscular Weakness    Cardiac Risk Stratification High           6 Minute Walk:  6 Minute Walk    Row Name 03/18/20 1531         6 Minute Walk   Phase Initial     Distance 765 feet     Walk Time 5.5 minutes     # of Rest Breaks 1     MPH 1.58     METS 1.07     RPE 13     Perceived Dyspnea  1     VO2 Peak 3.75     Symptoms Yes (comment)     Comments leg fatigue     Resting HR 64 bpm     Resting BP 138/50     Resting Oxygen Saturation  100 %     Exercise Oxygen Saturation  during 6 min walk 99 %     Max Ex. HR 91 bpm     Max Ex. BP 154/50     2 Minute Post BP 124/50  Oxygen Initial Assessment:   Oxygen Re-Evaluation:   Oxygen Discharge (Final Oxygen Re-Evaluation):   Initial Exercise Prescription:  Initial Exercise Prescription - 03/18/20 1500      Date of Initial Exercise RX and Referring Provider   Date 03/18/20    Referring Provider Paraschos      Treadmill   MPH 1    Grade 0    Minutes 15    METs 1.77      Recumbant Bike   Level 1    RPM 60    Minutes 15    METs 1      NuStep   Level 1    SPM 80    Minutes 15    METs 1      REL-XR   Level 1    Speed 50    Minutes 15    METs 1      Biostep-RELP   Level 1    SPM 50    Minutes 15    METs 1      Prescription Details   Frequency (times per week) 3    Duration Progress to 30 minutes of continuous aerobic without signs/symptoms of physical distress      Intensity   THRR 40-80% of Max Heartrate 92-119    Ratings of Perceived Exertion 11-15    Perceived Dyspnea 0-4      Resistance Training   Training Prescription Yes    Weight 3 lb    Reps 10-15           Perform Capillary Blood Glucose checks as needed.  Exercise Prescription Changes:  Exercise Prescription Changes    Row Name 03/18/20 1500 04/03/20 1400           Response to Exercise   Blood Pressure (Admit) 138/50 156/70      Blood Pressure (Exercise) 154/50 142/62       Blood Pressure (Exit) 124/50 150/68      Heart Rate (Admit) 64 bpm 83 bpm      Heart Rate (Exercise) 91 bpm 87 bpm      Heart Rate (Exit) 64 bpm 71 bpm      Oxygen Saturation (Admit) 100 % --      Oxygen Saturation (Exercise) 99 % --      Rating of Perceived Exertion (Exercise) 13 11      Perceived Dyspnea (Exercise) 1 --      Symptoms leg fatigue fatigue      Duration -- Continue with 30 min of aerobic exercise without signs/symptoms of physical distress.      Intensity -- THRR unchanged             Progression   Progression -- Continue to progress workloads to maintain intensity without signs/symptoms of physical distress.      Average METs -- 1.7             Resistance Training   Training Prescription -- Yes      Weight -- 3 lb      Reps -- 10-15             Interval Training   Interval Training -- No             NuStep   Level -- 1      Minutes -- 15  has gone up to 30 min      METs -- 1.4             Biostep-RELP   Level --  1      Minutes -- 15      METs -- 2             Exercise Comments:  Exercise Comments    Row Name 03/20/20 1552           Exercise Comments First full day of exercise!  Patient was oriented to gym and equipment including functions, settings, policies, and procedures.  Patient's individual exercise prescription and treatment plan were reviewed.  All starting workloads were established based on the results of the 6 minute walk test done at initial orientation visit.  The plan for exercise progression was also introduced and progression will be customized based on patient's performance and goals.              Exercise Goals and Review:  Exercise Goals    Row Name 03/18/20 1537             Exercise Goals   Increase Physical Activity Yes       Intervention Provide advice, education, support and counseling about physical activity/exercise needs.;Develop an individualized exercise prescription for aerobic and resistive training based  on initial evaluation findings, risk stratification, comorbidities and participant's personal goals.       Expected Outcomes Short Term: Attend rehab on a regular basis to increase amount of physical activity.;Long Term: Add in home exercise to make exercise part of routine and to increase amount of physical activity.;Long Term: Exercising regularly at least 3-5 days a week.       Increase Strength and Stamina Yes       Intervention Provide advice, education, support and counseling about physical activity/exercise needs.;Develop an individualized exercise prescription for aerobic and resistive training based on initial evaluation findings, risk stratification, comorbidities and participant's personal goals.       Expected Outcomes Short Term: Increase workloads from initial exercise prescription for resistance, speed, and METs.;Short Term: Perform resistance training exercises routinely during rehab and add in resistance training at home;Long Term: Improve cardiorespiratory fitness, muscular endurance and strength as measured by increased METs and functional capacity ( )       Able to understand and use rate of perceived exertion (RPE) scale Yes       Intervention Provide education and explanation on how to use RPE scale       Expected Outcomes Short Term: Able to use RPE daily in rehab to express subjective intensity level;Long Term:  Able to use RPE to guide intensity level when exercising independently       Able to understand and use Dyspnea scale Yes       Intervention Provide education and explanation on how to use Dyspnea scale       Expected Outcomes Short Term: Able to use Dyspnea scale daily in rehab to express subjective sense of shortness of breath during exertion;Long Term: Able to use Dyspnea scale to guide intensity level when exercising independently       Knowledge and understanding of Target Heart Rate Range (THRR) Yes       Intervention Provide education and explanation of THRR  including how the numbers were predicted and where they are located for reference       Expected Outcomes Short Term: Able to state/look up THRR;Short Term: Able to use daily as guideline for intensity in rehab;Long Term: Able to use THRR to govern intensity when exercising independently       Able to check pulse independently Yes  Intervention Provide education and demonstration on how to check pulse in carotid and radial arteries.;Review the importance of being able to check your own pulse for safety during independent exercise       Expected Outcomes Short Term: Able to explain why pulse checking is important during independent exercise;Long Term: Able to check pulse independently and accurately       Understanding of Exercise Prescription Yes       Intervention Provide education, explanation, and written materials on patient's individual exercise prescription       Expected Outcomes Short Term: Able to explain program exercise prescription;Long Term: Able to explain home exercise prescription to exercise independently              Exercise Goals Re-Evaluation :  Exercise Goals Re-Evaluation    Row Name 03/20/20 1552 04/03/20 1347 04/08/20 1554         Exercise Goal Re-Evaluation   Exercise Goals Review Increase Physical Activity;Able to understand and use rate of perceived exertion (RPE) scale;Knowledge and understanding of Target Heart Rate Range (THRR);Understanding of Exercise Prescription;Able to understand and use Dyspnea scale;Increase Strength and Stamina;Able to check pulse independently Increase Physical Activity;Increase Strength and Stamina;Understanding of Exercise Prescription --     Comments Reviewed RPE and dyspnea scales, THR and program prescription with pt today.  Pt voiced understanding and was given a copy of goals to take home. Chaniyah is off to a good start in rehab.  She is getting her full 30 min of exericse now.  We will continue to montior her progress. Tamitha  reports some mid to low back pain on machines.  A pillow behind back seems to help.     Expected Outcomes Short: Use RPE daily to regulate intensity. Long: Follow program prescription in THR. Short: Continue to attend regularly Long: Continue to follow progream prescription Short:  try machines without arms Long: build overall stamina            Discharge Exercise Prescription (Final Exercise Prescription Changes):  Exercise Prescription Changes - 04/03/20 1400      Response to Exercise   Blood Pressure (Admit) 156/70    Blood Pressure (Exercise) 142/62    Blood Pressure (Exit) 150/68    Heart Rate (Admit) 83 bpm    Heart Rate (Exercise) 87 bpm    Heart Rate (Exit) 71 bpm    Rating of Perceived Exertion (Exercise) 11    Symptoms fatigue    Duration Continue with 30 min of aerobic exercise without signs/symptoms of physical distress.    Intensity THRR unchanged      Progression   Progression Continue to progress workloads to maintain intensity without signs/symptoms of physical distress.    Average METs 1.7      Resistance Training   Training Prescription Yes    Weight 3 lb    Reps 10-15      Interval Training   Interval Training No      NuStep   Level 1    Minutes 15   has gone up to 30 min   METs 1.4      Biostep-RELP   Level 1    Minutes 15    METs 2           Nutrition:  Target Goals: Understanding of nutrition guidelines, daily intake of sodium 1500mg , cholesterol 200mg , calories 30% from fat and 7% or less from saturated fats, daily to have 5 or more servings of fruits and vegetables.  Education: All About  Nutrition: -Group instruction provided by verbal, written material, interactive activities, discussions, models, and posters to present general guidelines for heart healthy nutrition including fat, fiber, MyPlate, the role of sodium in heart healthy nutrition, utilization of the nutrition label, and utilization of this knowledge for meal planning. Follow  up email sent as well. Written material given at graduation.   Biometrics:  Pre Biometrics - 03/18/20 1540      Pre Biometrics   Height 5' 1.5" (1.562 m)    Weight 139 lb 12.8 oz (63.4 kg)    BMI (Calculated) 25.99            Nutrition Therapy Plan and Nutrition Goals:  Nutrition Therapy & Goals - 03/25/20 1333      Personal Nutrition Goals   Comments Picture Your Plate: 54:  A PYP score of 51-60 indicates a more healthful dietary pattern, with some room for improvement. She eats 0-1 servings of both fruits and vegetables per day; 0 dark green vegetables, 0-3 red and orange vegetables, and 0-10 starchy vegetables. She has 1 serving of whole grain bread and 1 serving of white bread per day, 2 other refined grains per week, 3+ servings of whole grain cereal and 1-2 servings of refined grain cereal per week. She will have 2+ servings of hot dogs or deli meat per week, red meat 0-2x/week - fat sometimes trimmed, 10% or less fat. She eats chicken with skin, deep fried, wings, smoked, and/or nuggets. She has fish 1x/week. She has beans 1-2x/week and nuts/nut butter 4+x/week. She has high fat dairy 1x/day and low fat dairy 2-3x/day, usually processed cheese. She has 3+ servings of (sour cream, cream, creamer, whipped topppings, regular cream cheese) per week. she uses soft margarine, unsaturated oils for frying, does not bake. She will sometimes use low sodium or no salt added canned foods, packets and sauces 1x/week, canned soup or bouillon or bottle dressing 1x/week, she does not add salt to her food. She has 4+ servings of baked sweets per week, snacks like chips, crakers, pretzels, and popcorn 2-3x/week. She goes out to eat 2-3x/week, she has fried foods 1-2x/week. She drinks fruit falvored drinks 1x/day and sugar sweetened coffee 2+x/day, she does not drink alcohol.           Nutrition Assessments:  MEDIFICTS Score Key:  ?70 Need to make dietary changes   40-70 Heart Healthy Diet  ?  40 Therapeutic Level Cholesterol Diet  Flowsheet Row Cardiac Rehab from 03/18/2020 in [facility] Cardiac and Pulmonary Rehab  Picture Your Plate Total Score on Admission 54     Picture Your Plate Scores:  <16 Unhealthy dietary pattern with much room for improvement.  41-50 Dietary pattern unlikely to meet recommendations for good health and room for improvement.  51-60 More healthful dietary pattern, with some room for improvement.   >60 Healthy dietary pattern, although there may be some specific behaviors that could be improved.    Nutrition Goals Re-Evaluation:   Nutrition Goals Discharge (Final Nutrition Goals Re-Evaluation):   Psychosocial: Target Goals: Acknowledge presence or absence of significant depression and/or stress, maximize coping skills, provide positive support system. Participant is able to verbalize types and ability to use techniques and skills needed for reducing stress and depression.   Education: Stress, Anxiety, and Depression - Group verbal and visual presentation to define topics covered.  Reviews how body is impacted by stress, anxiety, and depression.  Also discusses healthy ways to reduce stress and to treat/manage anxiety and depression.  Written material  given at graduation.   Education: Sleep Hygiene -Provides group verbal and written instruction about how sleep can affect your health.  Define sleep hygiene, discuss sleep cycles and impact of sleep habits. Review good sleep hygiene tips.    Initial Review & Psychosocial Screening:  Initial Psych Review & Screening - 03/08/20 1521      Initial Review   Current issues with Current Stress Concerns    Source of Stress Concerns Unable to perform yard/household activities;Chronic Illness      Family Dynamics   Good Support System? Yes   son, sister     Barriers   Psychosocial barriers to participate in program There are no identifiable barriers or psychosocial needs.      Screening Interventions    Interventions Encouraged to exercise;Provide feedback about the scores to participant;To provide support and resources with identified psychosocial needs    Expected Outcomes Short Term goal: Utilizing psychosocial counselor, staff and physician to assist with identification of specific Stressors or current issues interfering with healing process. Setting desired goal for each stressor or current issue identified.;Long Term Goal: Stressors or current issues are controlled or eliminated.;Short Term goal: Identification and review with participant of any Quality of Life or Depression concerns found by scoring the questionnaire.;Long Term goal: The participant improves quality of Life and PHQ9 Scores as seen by post scores and/or verbalization of changes           Quality of Life Scores:   Quality of Life - 03/18/20 1544      Quality of Life   Select Quality of Life      Quality of Life Scores   Health/Function Pre 26.4 %    Socioeconomic Pre 26 %    Psych/Spiritual Pre 26.57 %    Family Pre 28.5 %    GLOBAL Pre 26.66 %          Scores of 19 and below usually indicate a poorer quality of life in these areas.  A difference of  2-3 points is a clinically meaningful difference.  A difference of 2-3 points in the total score of the Quality of Life Index has been associated with significant improvement in overall quality of life, self-image, physical symptoms, and general health in studies assessing change in quality of life.  PHQ-9: Recent Review Flowsheet Data    Depression screen [facility] 2/9 03/18/2020   Decreased Interest 1   Down, Depressed, Hopeless 0   PHQ - 2 Score 1   Altered sleeping 0   Tired, decreased energy 1   Change in appetite 0   Feeling bad or failure about yourself  0   Trouble concentrating 0   Moving slowly or fidgety/restless 0   Suicidal thoughts 0   PHQ-9 Score 2   Difficult doing work/chores Not difficult at all     Interpretation of Total Score  Total Score  Depression Severity:  1-4 = Minimal depression, 5-9 = Mild depression, 10-14 = Moderate depression, 15-19 = Moderately severe depression, 20-27 = Severe depression   Psychosocial Evaluation and Intervention:  Psychosocial Evaluation - 04/08/20 1602      Psychosocial Evaluation & Interventions   Comments Today is Syla's 5 th session.  Her neck and back still bothers her.  She doesnt report any signs of depression or anxiety.           Psychosocial Re-Evaluation:  Psychosocial Re-Evaluation    Row Name 04/08/20 [phone] 04/08/20 1606  Psychosocial Re-Evaluation   Current issues with Current Stress Concerns None Identified      Comments -- Today is Chardonnay's 5 th session.  Her neck and back still bothers her.  She doesnt report any signs of depression or anxiety.      Expected Outcomes -- Short: follow up with Dr about back and neck Long: maintain positive outlook             Psychosocial Discharge (Final Psychosocial Re-Evaluation):  Psychosocial Re-Evaluation - 04/08/20 1606      Psychosocial Re-Evaluation   Current issues with None Identified    Comments Today is Zarayah's 5 th session.  Her neck and back still bothers her.  She doesnt report any signs of depression or anxiety.    Expected Outcomes Short: follow up with Dr about back and neck Long: maintain positive outlook           Vocational Rehabilitation: Provide vocational rehab assistance to qualifying candidates.   Vocational Rehab Evaluation & Intervention:  Vocational Rehab - 03/08/20 1521      Initial Vocational Rehab Evaluation & Intervention   Assessment shows need for Vocational Rehabilitation No           Education: Education Goals: Education classes will be provided on a variety of topics geared toward better understanding of heart health and risk factor modification. Participant will state understanding/return demonstration of topics presented as noted by education test scores.  Learning  Barriers/Preferences:  Learning Barriers/Preferences - 03/08/20 1520      Learning Barriers/Preferences   Learning Barriers None    Learning Preferences None           General Cardiac Education Topics:  AED/CPR: - Group verbal and written instruction with the use of models to demonstrate the basic use of the AED with the basic ABC's of resuscitation.   Anatomy and Cardiac Procedures: - Group verbal and visual presentation and models provide information about basic cardiac anatomy and function. Reviews the testing methods done to diagnose heart disease and the outcomes of the test results. Describes the treatment choices: Medical Management, Angioplasty, or Coronary Bypass Surgery for treating various heart conditions including Myocardial Infarction, Angina, Valve Disease, and Cardiac Arrhythmias.  Written material given at graduation.   Medication Safety: - Group verbal and visual instruction to review commonly prescribed medications for heart and lung disease. Reviews the medication, class of the drug, and side effects. Includes the steps to properly store meds and maintain the prescription regimen.  Written material given at graduation.   Intimacy: - Group verbal instruction through game format to discuss how heart and lung disease can affect sexual intimacy. Written material given at graduation..   Know Your Numbers and Heart Failure: - Group verbal and visual instruction to discuss disease risk factors for cardiac and pulmonary disease and treatment options.  Reviews associated critical values for Overweight/Obesity, Hypertension, Cholesterol, and Diabetes.  Discusses basics of heart failure: signs/symptoms and treatments.  Introduces Heart Failure Zone chart for action plan for heart failure.  Written material given at graduation.   Infection Prevention: - Provides verbal and written material to individual with discussion of infection control including proper hand washing and  proper equipment cleaning during exercise session.   Falls Prevention: - Provides verbal and written material to individual with discussion of falls prevention and safety.   Other: -Provides group and verbal instruction on various topics (see comments)   Knowledge Questionnaire Score:  Knowledge Questionnaire Score - 03/18/20 1545  Knowledge Questionnaire Score   Pre Score 26/26           Core Components/Risk Factors/Patient Goals at Admission:  Personal Goals and Risk Factors at Admission - 03/18/20 1542      Core Components/Risk Factors/Patient Goals on Admission    Weight Management Weight Maintenance    Diabetes Yes    Intervention Provide education about signs/symptoms and action to take for hypo/hyperglycemia.    Expected Outcomes Short Term: Participant verbalizes understanding of the signs/symptoms and immediate care of hyper/hypoglycemia, proper foot care and importance of medication, aerobic/resistive exercise and nutrition plan for blood glucose control.;Long Term: Attainment of HbA1C < 7%.    Hypertension Yes    Intervention Provide education on lifestyle modifcations including regular physical activity/exercise, weight management, moderate sodium restriction and increased consumption of fresh fruit, vegetables, and low fat dairy, alcohol moderation, and smoking cessation.;Monitor prescription use compliance.    Lipids Yes    Intervention Provide education and support for participant on nutrition & aerobic/resistive exercise along with prescribed medications to achieve LDL 70mg , HDL >40mg .    Expected Outcomes Short Term: Participant states understanding of desired cholesterol values and is compliant with medications prescribed. Participant is following exercise prescription and nutrition guidelines.;Long Term: Cholesterol controlled with medications as prescribed, with individualized exercise RX and with personalized nutrition plan. Value goals: LDL < 70mg , HDL > 40  mg.           Education:Diabetes - Individual verbal and written instruction to review signs/symptoms of diabetes, desired ranges of glucose level fasting, after meals and with exercise. Acknowledge that pre and post exercise glucose checks will be done for 3 sessions at entry of program. Flowsheet Row Cardiac Rehab from 03/20/2020 in [facility] Cardiac and [facility] Re[facility]  Date 03/08/20  Educator [facility]  Instruction Review Code 1- Verbalizes Understanding  [diet controlled]      Core Components/Risk Factors/Patient Goals Review:   Goals and Risk Factor Review    Row Name 04/08/20 1551             Core Components/Risk Factors/Patient Goals Review   Personal Goals Review Hypertension;Lipids;Diabetes       Review Constanza reports taking all meds as directed.  She says her neck hurts when she tries to hold her head up.  Pt has forward head posture.  Staff suggested she ask Dr about PT.  She hasnt been checking BG at home.  She checks BP at home periodically.       Expected Outcomes Short: check with Dr about neck pain and how often to check BG Long:  manage risk factors              Core Components/Risk Factors/Patient Goals at Discharge (Final Review):   Goals and Risk Factor Review - 04/08/20 1551      Core Components/Risk Factors/Patient Goals Review   Personal Goals Review Hypertension;Lipids;Diabetes    Review Xitlaly reports taking all meds as directed.  She says her neck hurts when she tries to hold her head up.  Pt has forward head posture.  Staff suggested she ask Dr about PT.  She hasnt been checking BG at home.  She checks BP at home periodically.    Expected Outcomes Short: check with Dr about neck pain and how often to check BG Long:  manage risk factors           ITP Comments:  ITP Comments    Row Name 03/08/20 1526 03/18/20 1553 03/20/20 1035 03/20/20 1551 04/17/20  [id number]   ITP Comments Initial telephone orientation completed. Diagnosis can be found in [facility] 8/[facility]. EP  orientation scheduled for Monday, 11/29 at 1pm. Completed and gym orientation. Initial ITP created and sent for review to Dr. Bethann Punches, Medical Director. 30 Day review completed. Medical Director ITP review done, changes made as directed, and signed approval by Medical Director.  New to program First full day of exercise!  Patient was oriented to gym and equipment including functions, settings, policies, and procedures.  Patient's individual exercise prescription and treatment plan were reviewed.  All starting workloads were established based on the results of the 6 minute walk test done at initial orientation visit.  The plan for exercise progression was also introduced and progression will be customized based on patient's performance and goals. 30 Day review completed. Medical Director ITP review done, changes made as directed, and signed approval by Medical Director.          Comments:

## 2020-04-17 NOTE — Progress Notes (Signed)
Daily Session Note  Patient Details  Name: [name] MRN: [id number] Date of Birth: October 13, 1930 Referring Provider:   Flowsheet Row Cardiac Rehab from 03/18/2020 in [facility] Cardiac and Pulmonary Rehab  Referring Provider Paraschos      Encounter Date: 04/17/2020  Check In:  Session Check In - 04/17/20 1547      Check-In   Supervising physician immediately available to respond to emergencies See telemetry face sheet for immediately available ER MD    Location [facility]-Cardiac & Pulmonary Rehab    Staff Present Renita Papa, RN BSN;Laureen Owens Shark, BS, RRT, CPFT;Amanda Oletta Darter, BA, ACSM CEP, Exercise Physiologist;Kara Eliezer Bottom, MS Exercise Physiologist    Virtual Visit No    Medication changes reported     No    Fall or balance concerns reported    No    Warm-up and Cool-down Performed on first and last piece of equipment    Resistance Training Performed Yes    VAD Patient? No    PAD/SET Patient? No      Pain Assessment   Currently in Pain? No/denies              Social History   Tobacco Use  Smoking Status Never Smoker  Smokeless Tobacco Never Used    Goals Met:  Independence with exercise equipment Exercise tolerated well No report of cardiac concerns or symptoms Strength training completed today  Goals Unmet:  Not Applicable  Comments: Pt able to follow exercise prescription today without complaint.  Will continue to monitor for progression.    Dr. Emily Filbert is Medical Director for [place] and [facility].

## 2020-04-18 ENCOUNTER — Encounter: Payer: Medicare Other | Admitting: *Deleted

## 2020-04-18 ENCOUNTER — Other Ambulatory Visit: Payer: Self-pay

## 2020-04-18 DIAGNOSIS — I214 Non-ST elevation (NSTEMI) myocardial infarction: Secondary | ICD-10-CM

## 2020-04-18 DIAGNOSIS — Z955 Presence of coronary angioplasty implant and graft: Secondary | ICD-10-CM

## 2020-04-18 NOTE — Progress Notes (Signed)
Daily Session Note  Patient Details  Name: [name] MRN: [id number] Date of Birth: 1930/06/26 Referring Provider:   Flowsheet Row Cardiac Rehab from 03/18/2020 in [facility] Cardiac and Pulmonary Rehab  Referring Provider Paraschos      Encounter Date: 04/18/2020  Check In:  Session Check In - 04/18/20 1542      Check-In   Supervising physician immediately available to respond to emergencies See telemetry face sheet for immediately available ER MD    Location [facility]-Cardiac & Pulmonary Rehab    Staff Present Renita Papa, RN BSN;Laureen Owens Shark, BS, RRT, CPFT;Amanda Oletta Darter, BA, ACSM CEP, Exercise Physiologist    Virtual Visit No    Medication changes reported     No    Fall or balance concerns reported    No    Warm-up and Cool-down Performed on first and last piece of equipment    Resistance Training Performed Yes    VAD Patient? No    PAD/SET Patient? No      Pain Assessment   Currently in Pain? No/denies              Social History   Tobacco Use  Smoking Status Never Smoker  Smokeless Tobacco Never Used    Goals Met:  Independence with exercise equipment Exercise tolerated well No report of cardiac concerns or symptoms Strength training completed today  Goals Unmet:  Not Applicable  Comments: Pt able to follow exercise prescription today without complaint.  Will continue to monitor for progression.    Dr. Emily Filbert is Medical Director for [place] and [facility].

## 2020-04-22 ENCOUNTER — Encounter: Payer: Medicare Other | Attending: Cardiology

## 2020-04-22 ENCOUNTER — Other Ambulatory Visit: Payer: Self-pay

## 2020-04-22 DIAGNOSIS — Z955 Presence of coronary angioplasty implant and graft: Secondary | ICD-10-CM | POA: Insufficient documentation

## 2020-04-22 DIAGNOSIS — I214 Non-ST elevation (NSTEMI) myocardial infarction: Secondary | ICD-10-CM | POA: Diagnosis not present

## 2020-04-22 NOTE — Progress Notes (Signed)
Daily Session Note  Patient Details  Name: [name] MRN: [id number] Date of Birth: [birth year] Referring Provider:   Flowsheet Row Cardiac Rehab from 03/18/2020 in [facility] Cardiac and Pulmonary Rehab  Referring Provider Paraschos      Encounter Date: 04/22/2020  Check In:  Session Check In - 04/22/20 1545      Check-In   Supervising physician immediately available to respond to emergencies See telemetry face sheet for immediately available ER MD    Location [facility]-Cardiac & Pulmonary Rehab    Staff Present Birdie Sons, MPA, RN;Melissa Caiola RDN, Tawanna Solo, MS Exercise Physiologist    Virtual Visit No    Medication changes reported     No    Fall or balance concerns reported    No    Warm-up and Cool-down Performed on first and last piece of equipment    Resistance Training Performed Yes    VAD Patient? No    PAD/SET Patient? No      Pain Assessment   Currently in Pain? No/denies              Social History   Tobacco Use  Smoking Status Never Smoker  Smokeless Tobacco Never Used    Goals Met:  Independence with exercise equipment Exercise tolerated well No report of cardiac concerns or symptoms Strength training completed today  Goals Unmet:  Not Applicable  Comments: Pt able to follow exercise prescription today without complaint.  Will continue to monitor for progression.    Dr. Emily Filbert is Medical Director for [place] and [facility].

## 2020-04-24 ENCOUNTER — Other Ambulatory Visit: Payer: Self-pay

## 2020-04-24 DIAGNOSIS — I214 Non-ST elevation (NSTEMI) myocardial infarction: Secondary | ICD-10-CM

## 2020-04-24 DIAGNOSIS — Z955 Presence of coronary angioplasty implant and graft: Secondary | ICD-10-CM | POA: Diagnosis not present

## 2020-04-24 NOTE — Progress Notes (Signed)
Daily Session Note  Patient Details  Name: [name] MRN: [id number] Date of Birth: 07-Dec-1930 Referring Provider:   Flowsheet Row Cardiac Rehab from 03/18/2020 in [facility] Cardiac and Pulmonary Rehab  Referring Provider Paraschos      Encounter Date: 04/24/2020  Check In:  Session Check In - 04/24/20 1556      Check-In   Supervising physician immediately available to respond to emergencies See telemetry face sheet for immediately available ER MD    Location [facility]-Cardiac & Pulmonary Rehab    Staff Present Birdie Sons, MPA, Nino Glow, MS Exercise Physiologist;Amanda Oletta Darter, [place], ACSM CEP, Exercise Physiologist    Virtual Visit No    Medication changes reported     No    Fall or balance concerns reported    No    Warm-up and Cool-down Performed on first and last piece of equipment    Resistance Training Performed Yes    VAD Patient? No    PAD/SET Patient? No      Pain Assessment   Currently in Pain? No/denies              Social History   Tobacco Use  Smoking Status Never Smoker  Smokeless Tobacco Never Used    Goals Met:  Independence with exercise equipment Exercise tolerated well No report of cardiac concerns or symptoms Strength training completed today  Goals Unmet:  Not Applicable  Comments: Pt able to follow exercise prescription today without complaint.  Will continue to monitor for progression.    Dr. Emily Filbert is Medical Director for [place] and [facility].

## 2020-04-25 ENCOUNTER — Encounter: Payer: Medicare Other | Admitting: *Deleted

## 2020-04-25 ENCOUNTER — Other Ambulatory Visit: Payer: Self-pay

## 2020-04-25 DIAGNOSIS — Z955 Presence of coronary angioplasty implant and graft: Secondary | ICD-10-CM

## 2020-04-25 DIAGNOSIS — I214 Non-ST elevation (NSTEMI) myocardial infarction: Secondary | ICD-10-CM

## 2020-04-25 LAB — GLUCOSE, CAPILLARY: Glucose-Capillary: 172 mg/dL — ABNORMAL HIGH (ref 70–99)

## 2020-04-25 NOTE — Progress Notes (Signed)
Daily Session Note  Patient Details  Name: [name] MRN: [id number] Date of Birth: 05/08/1930 Referring Provider:   Flowsheet Row Cardiac Rehab from 03/18/2020 in [facility] Cardiac and Pulmonary Rehab  Referring Provider Paraschos      Encounter Date: 04/25/2020  Check In:  Session Check In - 04/25/20 1705      Check-In   Supervising physician immediately available to respond to emergencies See telemetry face sheet for immediately available ER MD    Location [facility]-Cardiac & Pulmonary Rehab    Staff Present Kara Langdon, MS Exercise Physiologist; , RN, BSN, CCRP    Virtual Visit No    Medication changes reported     No    Fall or balance concerns reported    No    Warm-up and Cool-down Performed on first and last piece of equipment    Resistance Training Performed Yes    VAD Patient? No    PAD/SET Patient? No      Pain Assessment   Currently in Pain? No/denies              Social History   Tobacco Use  Smoking Status Never Smoker  Smokeless Tobacco Never Used    Goals Met:  Independence with exercise equipment Exercise tolerated well No report of cardiac concerns or symptoms  Goals Unmet:  Not Applicable  Comments: Pt able to follow exercise prescription today without complaint.  Will continue to monitor for progression.    Dr. Mark Miller is Medical Director for [facility] and [facility]. 

## 2020-04-29 ENCOUNTER — Other Ambulatory Visit: Payer: Self-pay

## 2020-04-29 DIAGNOSIS — Z955 Presence of coronary angioplasty implant and graft: Secondary | ICD-10-CM

## 2020-04-29 DIAGNOSIS — I214 Non-ST elevation (NSTEMI) myocardial infarction: Secondary | ICD-10-CM

## 2020-04-29 NOTE — Progress Notes (Signed)
Daily Session Note  Patient Details  Name: LORIEANN ARGUETA MRN: [id number] Date of Birth: 07-04-1930 Referring Provider:   Flowsheet Row Cardiac Rehab from 03/18/2020 in [facility] Cardiac and Pulmonary Rehab  Referring Provider Paraschos      Encounter Date: 04/29/2020  Check In:  Session Check In - 04/29/20 1537      Check-In   Supervising physician immediately available to respond to emergencies See telemetry face sheet for immediately available ER MD    Location [facility]-Cardiac & Pulmonary Rehab    Staff Present Birdie Sons, MPA, Mauricia Area, BS, ACSM CEP, Exercise Physiologist;Kara Eliezer Bottom, MS Exercise Physiologist    Virtual Visit No    Medication changes reported     No    Fall or balance concerns reported    No    Warm-up and Cool-down Performed on first and last piece of equipment    Resistance Training Performed Yes    VAD Patient? No    PAD/SET Patient? No      Pain Assessment   Currently in Pain? No/denies              Social History   Tobacco Use  Smoking Status Never Smoker  Smokeless Tobacco Never Used    Goals Met:  Independence with exercise equipment Exercise tolerated well No report of cardiac concerns or symptoms Strength training completed today  Goals Unmet:  Not Applicable  Comments: Pt able to follow exercise prescription today without complaint.  Will continue to monitor for progression.    Dr. Emily Filbert is Medical Director for [place] and [facility].

## 2020-05-13 ENCOUNTER — Other Ambulatory Visit: Payer: Self-pay

## 2020-05-13 DIAGNOSIS — Z955 Presence of coronary angioplasty implant and graft: Secondary | ICD-10-CM | POA: Diagnosis not present

## 2020-05-13 DIAGNOSIS — I214 Non-ST elevation (NSTEMI) myocardial infarction: Secondary | ICD-10-CM

## 2020-05-13 NOTE — Progress Notes (Signed)
Daily Session Note  Patient Details  Name: [name] MRN: [id number] Date of Birth: [birth year] Referring Provider:   Flowsheet Row Cardiac Rehab from 03/18/2020 in [facility] Cardiac and Pulmonary Rehab  Referring Provider Paraschos      Encounter Date: 05/13/2020  Check In:  Session Check In - 05/13/20 1538      Check-In   Supervising physician immediately available to respond to emergencies See telemetry face sheet for immediately available ER MD    Location [facility]-Cardiac & Pulmonary Rehab    Staff Present Earlean Shawl, BS, ACSM CEP, Exercise Physiologist;Kara Eliezer Bottom, MS Exercise Physiologist;Iridian Reader Rosalia Hammers, MPA, RN    Virtual Visit No    Medication changes reported     No    Fall or balance concerns reported    No    Warm-up and Cool-down Performed on first and last piece of equipment    Resistance Training Performed Yes    VAD Patient? No    PAD/SET Patient? No      Pain Assessment   Currently in Pain? No/denies              Social History   Tobacco Use  Smoking Status Never Smoker  Smokeless Tobacco Never Used    Goals Met:  Independence with exercise equipment Exercise tolerated well Personal goals reviewed No report of cardiac concerns or symptoms Strength training completed today  Goals Unmet:  Not Applicable  Comments: Pt able to follow exercise prescription today without complaint.  Will continue to monitor for progression. Reviewed home exercise with pt today.  Pt plans to use staff videos and consider [employer] for exercise.  Reviewed THR, pulse, RPE, sign and symptoms, pulse oximetery and when to call 911 or MD.  Also discussed weather considerations and indoor options.  Pt voiced understanding.    Dr. Emily Filbert is Medical Director for [place] and [facility].

## 2020-05-15 ENCOUNTER — Other Ambulatory Visit: Payer: Self-pay

## 2020-05-15 ENCOUNTER — Encounter: Payer: Self-pay | Admitting: *Deleted

## 2020-05-15 DIAGNOSIS — Z955 Presence of coronary angioplasty implant and graft: Secondary | ICD-10-CM

## 2020-05-15 DIAGNOSIS — I214 Non-ST elevation (NSTEMI) myocardial infarction: Secondary | ICD-10-CM

## 2020-05-15 NOTE — Progress Notes (Signed)
Cardiac Individual Treatment Plan  Patient Details  Name: [name] MRN: [id number] Date of Birth: 05-06-1930 Referring Provider:   Flowsheet Row Cardiac Rehab from 03/18/2020 in [facility] Cardiac and Pulmonary Rehab  Referring Provider Paraschos      Initial Encounter Date:  Flowsheet Row Cardiac Rehab from 03/18/2020 in [facility] Cardiac and Pulmonary Rehab  Date 03/18/20      Visit Diagnosis: NSTEMI (non-ST elevated myocardial infarction) [facility])  Status post coronary artery stent placement  Patient's Home Medications on Admission:  Current Outpatient Medications:  .  acetaminophen (TYLENOL) 500 MG tablet, Take 500 mg by mouth every 6 (six) hours as needed., Disp: , Rfl:  .  amLODipine (NORVASC) 5 MG tablet, Take 5 mg by mouth daily., Disp: , Rfl:  .  aspirin EC 81 MG tablet, Take 81 mg by mouth daily., Disp: , Rfl:  .  cloNIDine (CATAPRES - DOSED IN MG/24 HR) 0.1 mg/24hr patch, 0.1 mg once a week., Disp: , Rfl:  .  clopidogrel (PLAVIX) 75 MG tablet, Take 75 mg by mouth daily., Disp: , Rfl:  .  Dextromethorphan-guaiFENesin (MUCINEX DM PO), Take by mouth., Disp: , Rfl:  .  donepezil (ARICEPT) 5 MG tablet, Take 5 mg by mouth daily., Disp: , Rfl:  .  fluticasone (FLONASE) 50 MCG/ACT nasal spray, Place into both nostrils daily., Disp: , Rfl:  .  gabapentin (NEURONTIN) 100 MG capsule, Take 100 mg by mouth 3 (three) times daily. , Disp: , Rfl:  .  glimepiride (AMARYL) 1 MG tablet, Take 1 mg by mouth daily with breakfast. (Patient not taking: Reported on 03/08/2020), Disp: , Rfl:  .  isosorbide mononitrate (IMDUR) 60 MG 24 hr tablet, Take 60 mg by mouth daily., Disp: , Rfl:  .  magnesium oxide (MAG-OX) 400 MG tablet, Take 400 mg by mouth daily. , Disp: , Rfl:  .  metoprolol tartrate (LOPRESSOR) 25 MG tablet, Take 25 mg by mouth 2 (two) times daily. (Patient not taking: Reported on 03/08/2020), Disp: , Rfl:  .  Multiple Vitamins-Minerals (MULTIVITAMIN GUMMIES ADULT PO), Take by mouth., Disp: ,  Rfl:  .  nitroGLYCERIN (NITROSTAT) 0.4 MG SL tablet, Place 0.4 mg under the tongue every 5 (five) minutes as needed. For chest pain, Disp: , Rfl:  .  omeprazole (PRILOSEC) 20 MG capsule, Take 20 mg by mouth daily. , Disp: , Rfl:  .  polyethylene glycol (MIRALAX / GLYCOLAX) 17 g packet, Take 17 g by mouth daily as needed. (Patient not taking: Reported on 03/08/2020), Disp: , Rfl:  .  rosuvastatin (CRESTOR) 20 MG tablet, Take 20 mg by mouth daily., Disp: , Rfl:  .  spironolactone (ALDACTONE) 25 MG tablet, Take 25 mg by mouth daily., Disp: , Rfl:  .  timolol (TIMOPTIC) 0.5 % ophthalmic solution, Place 1 drop into both eyes at bedtime., Disp: , Rfl:  .  tiZANidine (ZANAFLEX) 2 MG tablet, Take 2 mg by mouth 3 (three) times daily.  (Patient not taking: Reported on 03/08/2020), Disp: , Rfl:  .  torsemide (DEMADEX) 20 MG tablet, Take 30 mg by mouth every evening. , Disp: , Rfl:   Past Medical History: Past Medical History:  Diagnosis Date  . Hypercholesteremia   . Hypertension   . Myocardial infarction (HCC) 2008  . S/P angioplasty with stent     Tobacco Use: Social History   Tobacco Use  Smoking Status Never Smoker  Smokeless Tobacco Never Used    Labs: Recent Review Contractor for  [facility] Cardiac and Pulmonary Rehab Latest Ref Rng & Units 12/08/2011 08/15/2012 11/18/2019   Cholestrol 0 - 200 mg/dL - 166 063   LDLCALC 0 - 99 mg/dL - 81 016(W)   HDL >10 mg/dL - 93(A) 52   Trlycerides <150 mg/dL - 355(D) 92   Hemoglobin A1c 4.8 - 5.6 % - 7.5(H) 6.5(H)   TCO2 0 - 100 mmol/L 26 - -       Exercise Target Goals: Exercise Program Goal: Individual exercise prescription set using results from initial 6 min walk test and THRR while considering  patient's activity barriers and safety.   Exercise Prescription Goal: Initial exercise prescription builds to 30-45 minutes a day of aerobic activity, 2-3 days per week.  Home exercise guidelines will be given to patient during program as part  of exercise prescription that the participant will acknowledge.   Education: Aerobic Exercise: - Group verbal and visual presentation on the components of exercise prescription. Introduces F.I.T.T principle from ACSM for exercise prescriptions.  Reviews F.I.T.T. principles of aerobic exercise including progression. Written material given at graduation. Flowsheet Row Cardiac Rehab from 04/24/2020 in [facility] Cardiac and Pulmonary Rehab  Date 04/17/20  Educator AS  Instruction Review Code 1- Verbalizes Understanding      Education: Resistance Exercise: - Group verbal and visual presentation on the components of exercise prescription. Introduces F.I.T.T principle from ACSM for exercise prescriptions  Reviews F.I.T.T. principles of resistance exercise including progression. Written material given at graduation.    Education: Exercise & Equipment Safety: - Individual verbal instruction and demonstration of equipment use and safety with use of the equipment.   Education: Exercise Physiology & General Exercise Guidelines: - Group verbal and written instruction with models to review the exercise physiology of the cardiovascular system and associated critical values. Provides general exercise guidelines with specific guidelines to those with heart or lung disease.    Education: Flexibility, Balance, Mind/Body Relaxation: - Group verbal and visual presentation with interactive activity on the components of exercise prescription. Introduces F.I.T.T principle from ACSM for exercise prescriptions. Reviews F.I.T.T. principles of flexibility and balance exercise training including progression. Also discusses the mind body connection.  Reviews various relaxation techniques to help reduce and manage stress (i.e. Deep breathing, progressive muscle relaxation, and visualization). Balance handout provided to take home. Written material given at graduation. Flowsheet Row Cardiac Rehab from 04/24/2020 in [facility] Cardiac  and Pulmonary Rehab  Date 04/24/20  Educator AS  Instruction Review Code 1- Verbalizes Understanding      Activity Barriers & Risk Stratification:  Activity Barriers & Cardiac Risk Stratification - 03/08/20 1518      Activity Barriers & Cardiac Risk Stratification   Activity Barriers Assistive Device;Back Problems;Neck/Spine Problems;Muscular Weakness    Cardiac Risk Stratification High           6 Minute Walk:  6 Minute Walk    Row Name 03/18/20 1531         6 Minute Walk   Phase Initial     Distance 765 feet     Walk Time 5.5 minutes     # of Rest Breaks 1     MPH 1.58     METS 1.07     RPE 13     Perceived Dyspnea  1     VO2 Peak 3.75     Symptoms Yes (comment)     Comments leg fatigue     Resting HR 64 bpm     Resting BP 138/50  Resting Oxygen Saturation  100 %     Exercise Oxygen Saturation  during 6 min walk 99 %     Max Ex. HR 91 bpm     Max Ex. BP 154/50     2 Minute Post BP 124/50            Oxygen Initial Assessment:   Oxygen Re-Evaluation:   Oxygen Discharge (Final Oxygen Re-Evaluation):   Initial Exercise Prescription:  Initial Exercise Prescription - 03/18/20 1500      Date of Initial Exercise RX and Referring Provider   Date 03/18/20    Referring Provider Paraschos      Treadmill   MPH 1    Grade 0    Minutes 15    METs 1.77      Recumbant Bike   Level 1    RPM 60    Minutes 15    METs 1      NuStep   Level 1    SPM 80    Minutes 15    METs 1      REL-XR   Level 1    Speed 50    Minutes 15    METs 1      Biostep-RELP   Level 1    SPM 50    Minutes 15    METs 1      Prescription Details   Frequency (times per week) 3    Duration Progress to 30 minutes of continuous aerobic without signs/symptoms of physical distress      Intensity   THRR 40-80% of Max Heartrate 92-119    Ratings of Perceived Exertion 11-15    Perceived Dyspnea 0-4      Resistance Training   Training Prescription Yes    Weight 3 lb     Reps 10-15           Perform Capillary Blood Glucose checks as needed.  Exercise Prescription Changes:  Exercise Prescription Changes    Row Name 03/18/20 1500 04/03/20 1400 04/17/20 0800 04/29/20 1100 05/13/20 1600     Response to Exercise   Blood Pressure (Admit) 138/50 156/70 142/68 128/58 --   Blood Pressure (Exercise) 154/50 142/62 180/64 130/60 --   Blood Pressure (Exit) 124/50 150/68 132/68 126/58 --   Heart Rate (Admit) 64 bpm 83 bpm 78 bpm 81 bpm --   Heart Rate (Exercise) 91 bpm 87 bpm 101 bpm 77 bpm --   Heart Rate (Exit) 64 bpm 71 bpm 81 bpm 62 bpm --   Oxygen Saturation (Admit) 100 % -- -- -- --   Oxygen Saturation (Exercise) 99 % -- -- -- --   Rating of Perceived Exertion (Exercise) 13 11 13 11  --   Perceived Dyspnea (Exercise) 1 -- -- -- --   Symptoms leg fatigue fatigue -- hips hurting on treadmill --   Duration -- Continue with 30 min of aerobic exercise without signs/symptoms of physical distress. Continue with 30 min of aerobic exercise without signs/symptoms of physical distress. Continue with 30 min of aerobic exercise without signs/symptoms of physical distress. --   Intensity -- THRR unchanged THRR unchanged THRR unchanged --     Progression   Progression -- Continue to progress workloads to maintain intensity without signs/symptoms of physical distress. Continue to progress workloads to maintain intensity without signs/symptoms of physical distress. Continue to progress workloads to maintain intensity without signs/symptoms of physical distress. --   Average METs -- 1.7 2.1 1.66 --  Resistance Training   Training Prescription -- Yes Yes Yes --   Weight -- 3 lb 2 lb 2 lb --   Reps -- 10-15 10-15 10-15 --     Interval Training   Interval Training -- No No No --     Treadmill   MPH -- -- -- 1 --   Grade -- -- -- 0 --   Minutes -- -- -- 2 --   METs -- -- -- 1.77 --     NuStep   Level -- 1 1 1  --   SPM -- -- 80 -- --   Minutes -- 15  has gone  up to 30 min 15 15 --   METs -- 1.4 2.1 1.5 --     T5 Nustep   Level -- -- -- 1 --   Minutes -- -- -- 15 --   METs -- -- -- 1.7 --     Biostep-RELP   Level -- 1 -- -- --   Minutes -- 15 -- -- --   METs -- 2 -- -- --     Home Exercise Plan   Plans to continue exercise at -- -- -- -- Home (comment)   Frequency -- -- -- -- Add 1 additional day to program exercise sessions.   Initial Home Exercises Provided -- -- -- -- 05/13/20   Row Name 05/14/20 1200             Response to Exercise   Blood Pressure (Admit) 142/60       Blood Pressure (Exercise) 152/80       Blood Pressure (Exit) 140/62       Heart Rate (Admit) 65 bpm       Heart Rate (Exercise) 89 bpm       Heart Rate (Exit) 76 bpm       Rating of Perceived Exertion (Exercise) 11       Duration Progress to 30 minutes of  aerobic without signs/symptoms of physical distress       Intensity THRR unchanged               Progression   Progression Continue to progress workloads to maintain intensity without signs/symptoms of physical distress.       Average METs 1.5               Resistance Training   Training Prescription Yes       Weight 2 lb       Reps 10-15               Interval Training   Interval Training No               NuStep   Level 1       Minutes 30       METs 1.5              Exercise Comments:  Exercise Comments    Row Name 03/20/20 1552           Exercise Comments First full day of exercise!  Patient was oriented to gym and equipment including functions, settings, policies, and procedures.  Patient's individual exercise prescription and treatment plan were reviewed.  All starting workloads were established based on the results of the 6 minute walk test done at initial orientation visit.  The plan for exercise progression was also introduced and progression will be customized based on patient's performance and goals.  Exercise Goals and Review:  Exercise Goals    Row Name  03/18/20 1537             Exercise Goals   Increase Physical Activity Yes       Intervention Provide advice, education, support and counseling about physical activity/exercise needs.;Develop an individualized exercise prescription for aerobic and resistive training based on initial evaluation findings, risk stratification, comorbidities and participant's personal goals.       Expected Outcomes Short Term: Attend rehab on a regular basis to increase amount of physical activity.;Long Term: Add in home exercise to make exercise part of routine and to increase amount of physical activity.;Long Term: Exercising regularly at least 3-5 days a week.       Increase Strength and Stamina Yes       Intervention Provide advice, education, support and counseling about physical activity/exercise needs.;Develop an individualized exercise prescription for aerobic and resistive training based on initial evaluation findings, risk stratification, comorbidities and participant's personal goals.       Expected Outcomes Short Term: Increase workloads from initial exercise prescription for resistance, speed, and METs.;Short Term: Perform resistance training exercises routinely during rehab and add in resistance training at home;Long Term: Improve cardiorespiratory fitness, muscular endurance and strength as measured by increased METs and functional capacity ( )       Able to understand and use rate of perceived exertion (RPE) scale Yes       Intervention Provide education and explanation on how to use RPE scale       Expected Outcomes Short Term: Able to use RPE daily in rehab to express subjective intensity level;Long Term:  Able to use RPE to guide intensity level when exercising independently       Able to understand and use Dyspnea scale Yes       Intervention Provide education and explanation on how to use Dyspnea scale       Expected Outcomes Short Term: Able to use Dyspnea scale daily in rehab to express  subjective sense of shortness of breath during exertion;Long Term: Able to use Dyspnea scale to guide intensity level when exercising independently       Knowledge and understanding of Target Heart Rate Range (THRR) Yes       Intervention Provide education and explanation of THRR including how the numbers were predicted and where they are located for reference       Expected Outcomes Short Term: Able to state/look up THRR;Short Term: Able to use daily as guideline for intensity in rehab;Long Term: Able to use THRR to govern intensity when exercising independently       Able to check pulse independently Yes       Intervention Provide education and demonstration on how to check pulse in carotid and radial arteries.;Review the importance of being able to check your own pulse for safety during independent exercise       Expected Outcomes Short Term: Able to explain why pulse checking is important during independent exercise;Long Term: Able to check pulse independently and accurately       Understanding of Exercise Prescription Yes       Intervention Provide education, explanation, and written materials on patient's individual exercise prescription       Expected Outcomes Short Term: Able to explain program exercise prescription;Long Term: Able to explain home exercise prescription to exercise independently              Exercise Goals Re-Evaluation :  Exercise Goals Re-Evaluation  Row Name 03/20/20 1552 04/03/20 1347 04/08/20 1554 04/17/20 0818 04/29/20 1143     Exercise Goal Re-Evaluation   Exercise Goals Review Increase Physical Activity;Able to understand and use rate of perceived exertion (RPE) scale;Knowledge and understanding of Target Heart Rate Range (THRR);Understanding of Exercise Prescription;Able to understand and use Dyspnea scale;Increase Strength and Stamina;Able to check pulse independently Increase Physical Activity;Increase Strength and Stamina;Understanding of Exercise  Prescription -- Increase Physical Activity;Increase Strength and Stamina Increase Physical Activity;Increase Strength and Stamina;Understanding of Exercise Prescription   Comments Reviewed RPE and dyspnea scales, THR and program prescription with pt today.  Pt voiced understanding and was given a copy of goals to take home. Laiana is off to a good start in rehab.  She is getting her full 30 min of exericse now.  We will continue to montior her progress. Neoma reports some mid to low back pain on machines.  A pillow behind back seems to help. Mackenzie has attended consistently this month.  She has reached her THR range on machines.  Staff will monitor progress. Maja has been doing well in rehab.  She is up to 2 min on the treadmill!  We will continue to monitor her progress.   Expected Outcomes Short: Use RPE daily to regulate intensity. Long: Follow program prescription in THR. Short: Continue to attend regularly Long: Continue to follow progream prescription Short:  try machines without arms Long: build overall stamina Short: continue to attend consistently Long:  build stamina Short: Increase level on NuStep Long: Continue to improve stamina   Row Name 05/13/20 1548 05/13/20 1641           Exercise Goal Re-Evaluation   Exercise Goals Review -- Increase Physical Activity;Increase Strength and Stamina      Comments -- Reviewed home exercise with pt today.  Pt plans to use staff videos and consider [employer] for exercise.  Reviewed THR, pulse, RPE, sign and symptoms, pulse oximetery and when to call 911 or MD.  Also discussed weather considerations and indoor options.  Pt voiced understanding.      Expected Outcomes -- Short: begin exercising one day per week outside program sessions             Discharge Exercise Prescription (Final Exercise Prescription Changes):  Exercise Prescription Changes - 05/14/20 1200      Response to Exercise   Blood Pressure (Admit) 142/60    Blood Pressure  (Exercise) 152/80    Blood Pressure (Exit) 140/62    Heart Rate (Admit) 65 bpm    Heart Rate (Exercise) 89 bpm    Heart Rate (Exit) 76 bpm    Rating of Perceived Exertion (Exercise) 11    Duration Progress to 30 minutes of  aerobic without signs/symptoms of physical distress    Intensity THRR unchanged      Progression   Progression Continue to progress workloads to maintain intensity without signs/symptoms of physical distress.    Average METs 1.5      Resistance Training   Training Prescription Yes    Weight 2 lb    Reps 10-15      Interval Training   Interval Training No      NuStep   Level 1    Minutes 30    METs 1.5           Nutrition:  Target Goals: Understanding of nutrition guidelines, daily intake of sodium 1500mg , cholesterol 200mg , calories 30% from fat and 7% or less from saturated fats, daily to have  5 or more servings of fruits and vegetables.  Education: All About Nutrition: -Group instruction provided by verbal, written material, interactive activities, discussions, models, and posters to present general guidelines for heart healthy nutrition including fat, fiber, MyPlate, the role of sodium in heart healthy nutrition, utilization of the nutrition label, and utilization of this knowledge for meal planning. Follow up email sent as well. Written material given at graduation.   Biometrics:  Pre Biometrics - 03/18/20 1540      Pre Biometrics   Height 5' 1.5" (1.562 m)    Weight 139 lb 12.8 oz (63.4 kg)    BMI (Calculated) 25.99            Nutrition Therapy Plan and Nutrition Goals:  Nutrition Therapy & Goals - 03/25/20 1333      Personal Nutrition Goals   Comments Picture Your Plate: 54:  A PYP score of 51-60 indicates a more healthful dietary pattern, with some room for improvement. She eats 0-1 servings of both fruits and vegetables per day; 0 dark green vegetables, 0-3 red and orange vegetables, and 0-10 starchy vegetables. She has 1 serving of  whole grain bread and 1 serving of white bread per day, 2 other refined grains per week, 3+ servings of whole grain cereal and 1-2 servings of refined grain cereal per week. She will have 2+ servings of hot dogs or deli meat per week, red meat 0-2x/week - fat sometimes trimmed, 10% or less fat. She eats chicken with skin, deep fried, wings, smoked, and/or nuggets. She has fish 1x/week. She has beans 1-2x/week and nuts/nut butter 4+x/week. She has high fat dairy 1x/day and low fat dairy 2-3x/day, usually processed cheese. She has 3+ servings of (sour cream, cream, creamer, whipped topppings, regular cream cheese) per week. she uses soft margarine, unsaturated oils for frying, does not bake. She will sometimes use low sodium or no salt added canned foods, packets and sauces 1x/week, canned soup or bouillon or bottle dressing 1x/week, she does not add salt to her food. She has 4+ servings of baked sweets per week, snacks like chips, crakers, pretzels, and popcorn 2-3x/week. She goes out to eat 2-3x/week, she has fried foods 1-2x/week. She drinks fruit falvored drinks 1x/day and sugar sweetened coffee 2+x/day, she does not drink alcohol.           Nutrition Assessments:  MEDIFICTS Score Key:  ?70 Need to make dietary changes   40-70 Heart Healthy Diet  ? 40 Therapeutic Level Cholesterol Diet  Flowsheet Row Cardiac Rehab from 03/18/2020 in [facility] Cardiac and Pulmonary Rehab  Picture Your Plate Total Score on Admission 54     Picture Your Plate Scores:  <96 Unhealthy dietary pattern with much room for improvement.  41-50 Dietary pattern unlikely to meet recommendations for good health and room for improvement.  51-60 More healthful dietary pattern, with some room for improvement.   >60 Healthy dietary pattern, although there may be some specific behaviors that could be improved.    Nutrition Goals Re-Evaluation:  Nutrition Goals Re-Evaluation    Row Name 05/13/20 1555             Goals    Comment Scheduled nutrition appointment              Nutrition Goals Discharge (Final Nutrition Goals Re-Evaluation):  Nutrition Goals Re-Evaluation - 05/13/20 1555      Goals   Comment Scheduled nutrition appointment           Psychosocial: Target Goals:  Acknowledge presence or absence of significant depression and/or stress, maximize coping skills, provide positive support system. Participant is able to verbalize types and ability to use techniques and skills needed for reducing stress and depression.   Education: Stress, Anxiety, and Depression - Group verbal and visual presentation to define topics covered.  Reviews how body is impacted by stress, anxiety, and depression.  Also discusses healthy ways to reduce stress and to treat/manage anxiety and depression.  Written material given at graduation.   Education: Sleep Hygiene -Provides group verbal and written instruction about how sleep can affect your health.  Define sleep hygiene, discuss sleep cycles and impact of sleep habits. Review good sleep hygiene tips.    Initial Review & Psychosocial Screening:  Initial Psych Review & Screening - 03/08/20 1521      Initial Review   Current issues with Current Stress Concerns    Source of Stress Concerns Unable to perform yard/household activities;Chronic Illness      Family Dynamics   Good Support System? Yes   son, sister     Barriers   Psychosocial barriers to participate in program There are no identifiable barriers or psychosocial needs.      Screening Interventions   Interventions Encouraged to exercise;Provide feedback about the scores to participant;To provide support and resources with identified psychosocial needs    Expected Outcomes Short Term goal: Utilizing psychosocial counselor, staff and physician to assist with identification of specific Stressors or current issues interfering with healing process. Setting desired goal for each stressor or current issue  identified.;Long Term Goal: Stressors or current issues are controlled or eliminated.;Short Term goal: Identification and review with participant of any Quality of Life or Depression concerns found by scoring the questionnaire.;Long Term goal: The participant improves quality of Life and PHQ9 Scores as seen by post scores and/or verbalization of changes           Quality of Life Scores:   Quality of Life - 03/18/20 1544      Quality of Life   Select Quality of Life      Quality of Life Scores   Health/Function Pre 26.4 %    Socioeconomic Pre 26 %    Psych/Spiritual Pre 26.57 %    Family Pre 28.5 %    GLOBAL Pre 26.66 %          Scores of 19 and below usually indicate a poorer quality of life in these areas.  A difference of  2-3 points is a clinically meaningful difference.  A difference of 2-3 points in the total score of the Quality of Life Index has been associated with significant improvement in overall quality of life, self-image, physical symptoms, and general health in studies assessing change in quality of life.  PHQ-9: Recent Review Flowsheet Data    [facility]   Decreased Interest 1   Down, Depressed, Hopeless 0   PHQ - 2 Score 1   Altered sleeping 0   Tired, decreased energy 1   Change in appetite 0   Feeling bad or failure about yourself  0   Trouble concentrating 0   Moving slowly or fidgety/restless 0   Suicidal thoughts 0   PHQ-9 Score 2   Difficult doing work/chores Not difficult at all     Interpretation of Total Score  Total Score Depression Severity:  1-4 = Minimal depression, 5-9 = Mild depression, 10-14 = Moderate depression, 15-19 = Moderately severe depression, 20-27 = Severe depression  Psychosocial Evaluation and Intervention:  Psychosocial Evaluation - 04/08/20 1602      Psychosocial Evaluation & Interventions   Comments Today is Everett's 5 th session.  Her neck and back still bothers her.  She doesnt report any  signs of depression or anxiety.           Psychosocial Re-Evaluation:  Psychosocial Re-Evaluation    Row Name 04/08/20 1602 04/08/20 1606 05/13/20 1550         Psychosocial Re-Evaluation   Current issues with Current Stress Concerns None Identified None Identified     Comments -- Today is Nhi's 5 th session.  Her neck and back still bothers her.  She doesnt report any signs of depression or anxiety. She reports feeling tense sometimes- she reports being prescribed medication (xanax) for it but doesn't take it all the time - she reports not liking to. She relaxes by reclining in the chair at home, she loves sudoku and word finds. She reports calling friends for support.     Expected Outcomes -- Short: follow up with Dr about back and neck Long: maintain positive outlook ST: Continue to use medication as needed LT: mainain positive outlook     Interventions -- -- Encouraged to attend Cardiac Rehabilitation for the exercise     Continue Psychosocial Services  -- -- Follow up required by staff           Initial Review   Source of Stress Concerns -- -- Unable to perform yard/household activities;Chronic Illness            Psychosocial Discharge (Final Psychosocial Re-Evaluation):  Psychosocial Re-Evaluation - 05/13/20 1550      Psychosocial Re-Evaluation   Current issues with None Identified    Comments She reports feeling tense sometimes- she reports being prescribed medication (xanax) for it but doesn't take it all the time - she reports not liking to. She relaxes by reclining in the chair at home, she loves sudoku and word finds. She reports calling friends for support.    Expected Outcomes ST: Continue to use medication as needed LT: mainain positive outlook    Interventions Encouraged to attend Cardiac Rehabilitation for the exercise    Continue Psychosocial Services  Follow up required by staff      Initial Review   Source of Stress Concerns Unable to perform yard/household  activities;Chronic Illness           Vocational Rehabilitation: Provide vocational rehab assistance to qualifying candidates.   Vocational Rehab Evaluation & Intervention:  Vocational Rehab - 03/08/20 1521      Initial Vocational Rehab Evaluation & Intervention   Assessment shows need for Vocational Rehabilitation No           Education: Education Goals: Education classes will be provided on a variety of topics geared toward better understanding of heart health and risk factor modification. Participant will state understanding/return demonstration of topics presented as noted by education test scores.  Learning Barriers/Preferences:  Learning Barriers/Preferences - 03/08/20 1520      Learning Barriers/Preferences   Learning Barriers None    Learning Preferences None           General Cardiac Education Topics:  AED/CPR: - Group verbal and written instruction with the use of models to demonstrate the basic use of the AED with the basic ABC's of resuscitation.   Anatomy and Cardiac Procedures: - Group verbal and visual presentation and models provide information about basic cardiac anatomy and function. Reviews the testing methods  done to diagnose heart disease and the outcomes of the test results. Describes the treatment choices: Medical Management, Angioplasty, or Coronary Bypass Surgery for treating various heart conditions including Myocardial Infarction, Angina, Valve Disease, and Cardiac Arrhythmias.  Written material given at graduation.   Medication Safety: - Group verbal and visual instruction to review commonly prescribed medications for heart and lung disease. Reviews the medication, class of the drug, and side effects. Includes the steps to properly store meds and maintain the prescription regimen.  Written material given at graduation.   Intimacy: - Group verbal instruction through game format to discuss how heart and lung disease can affect sexual intimacy.  Written material given at graduation.. Flowsheet Row Cardiac Rehab from 04/24/2020 in [facility] Cardiac and Pulmonary Rehab  Date 04/17/20  Educator AS  Instruction Review Code 1- Verbalizes Understanding      Know Your Numbers and Heart Failure: - Group verbal and visual instruction to discuss disease risk factors for cardiac and pulmonary disease and treatment options.  Reviews associated critical values for Overweight/Obesity, Hypertension, Cholesterol, and Diabetes.  Discusses basics of heart failure: signs/symptoms and treatments.  Introduces Heart Failure Zone chart for action plan for heart failure.  Written material given at graduation.   Infection Prevention: - Provides verbal and written material to individual with discussion of infection control including proper hand washing and proper equipment cleaning during exercise session.   Falls Prevention: - Provides verbal and written material to individual with discussion of falls prevention and safety.   Other: -Provides group and verbal instruction on various topics (see comments)   Knowledge Questionnaire Score:  Knowledge Questionnaire Score - 03/18/20 1545      Knowledge Questionnaire Score   Pre Score 26/26           Core Components/Risk Factors/Patient Goals at Admission:  Personal Goals and Risk Factors at Admission - 03/18/20 1542      Core Components/Risk Factors/Patient Goals on Admission    Weight Management Weight Maintenance    Diabetes Yes    Intervention Provide education about signs/symptoms and action to take for hypo/hyperglycemia.    Expected Outcomes Short Term: Participant verbalizes understanding of the signs/symptoms and immediate care of hyper/hypoglycemia, proper foot care and importance of medication, aerobic/resistive exercise and nutrition plan for blood glucose control.;Long Term: Attainment of HbA1C < 7%.    Hypertension Yes    Intervention Provide education on lifestyle modifcations including  regular physical activity/exercise, weight management, moderate sodium restriction and increased consumption of fresh fruit, vegetables, and low fat dairy, alcohol moderation, and smoking cessation.;Monitor prescription use compliance.    Lipids Yes    Intervention Provide education and support for participant on nutrition & aerobic/resistive exercise along with prescribed medications to achieve LDL 70mg , HDL >40mg .    Expected Outcomes Short Term: Participant states understanding of desired cholesterol values and is compliant with medications prescribed. Participant is following exercise prescription and nutrition guidelines.;Long Term: Cholesterol controlled with medications as prescribed, with individualized exercise RX and with personalized nutrition plan. Value goals: LDL < 70mg , HDL > 40 mg.           Education:Diabetes - Individual verbal and written instruction to review signs/symptoms of diabetes, desired ranges of glucose level fasting, after meals and with exercise. Acknowledge that pre and post exercise glucose checks will be done for 3 sessions at entry of program. Flowsheet Row Cardiac Rehab from 04/24/2020 in [facility] Cardiac and Pulmonary Rehab  Date 03/08/20  Educator [facility]  Instruction Review Code 1-  Verbalizes Understanding  [diet controlled]      Core Components/Risk Factors/Patient Goals Review:   Goals and Risk Factor Review    Row Name 04/08/20 1551 05/13/20 1556           Core Components/Risk Factors/Patient Goals Review   Personal Goals Review Hypertension;Lipids;Diabetes Hypertension;Lipids;Diabetes      Review Arrow reports taking all meds as directed.  She says her neck hurts when she tries to hold her head up.  Pt has forward head posture.  Staff suggested she ask Dr about PT.  She hasnt been checking BG at home.  She checks BP at home periodically. Mandalyn reports taking all meds as directed.  She says her neck hurts when she tries to hold her head up - she reports no  difference.  Pt has forward head posture.  Staff suggested she ask Dr about PT - she has a physical coming up in March; encouraged to talk to doctor sooner.  She hasnt been checking BG at home.  She checks BP at home periodically.      Expected Outcomes Short: check with Dr about neck pain and how often to check BG Long:  manage risk factors Short: check with Dr about neck pain and how often to check BG, check BP more often when not at rehab Long:  manage risk factors             Core Components/Risk Factors/Patient Goals at Discharge (Final Review):   Goals and Risk Factor Review - 05/13/20 1556      Core Components/Risk Factors/Patient Goals Review   Personal Goals Review Hypertension;Lipids;Diabetes    Review Vena reports taking all meds as directed.  She says her neck hurts when she tries to hold her head up - she reports no difference.  Pt has forward head posture.  Staff suggested she ask Dr about PT - she has a physical coming up in March; encouraged to talk to doctor sooner.  She hasnt been checking BG at home.  She checks BP at home periodically.    Expected Outcomes Short: check with Dr about neck pain and how often to check BG, check BP more often when not at rehab Long:  manage risk factors           ITP Comments:  ITP Comments    Row Name 03/08/20 1526 03/18/20 1553 03/20/20 1035 03/20/20 1551 04/17/20 0628   ITP Comments Initial telephone orientation completed. Diagnosis can be found in [facility] 8/22. EP orientation scheduled for Monday, 11/29 at 1pm. Completed and gym orientation. Initial ITP created and sent for review to Dr. Bethann Punches, Medical Director. 30 Day review completed. Medical Director ITP review done, changes made as directed, and signed approval by Medical Director.  New to program First full day of exercise!  Patient was oriented to gym and equipment including functions, settings, policies, and procedures.  Patient's individual exercise prescription and  treatment plan were reviewed.  All starting workloads were established based on the results of the 6 minute walk test done at initial orientation visit.  The plan for exercise progression was also introduced and progression will be customized based on patient's performance and goals. 30 Day review completed. Medical Director ITP review done, changes made as directed, and signed approval by Medical Director.   Row Name 05/15/20 0942           ITP Comments 30 Day review completed. Medical Director ITP review done, changes made as directed, and signed approval  by Medical Director.              Comments:

## 2020-05-15 NOTE — Progress Notes (Signed)
Daily Session Note  Patient Details  Name: [name] MRN: [id number] Date of Birth: June 18, 1930 Referring Provider:   Flowsheet Row Cardiac Rehab from 03/18/2020 in [facility] Cardiac and Pulmonary Rehab  Referring Provider Paraschos      Encounter Date: 05/15/2020  Check In:  Session Check In - 05/15/20 1551      Check-In   Supervising physician immediately available to respond to emergencies See telemetry face sheet for immediately available ER MD    Location [facility]-Cardiac & Pulmonary Rehab    Staff Present Birdie Sons, MPA, Elveria Rising, BA, ACSM CEP, Exercise Physiologist;Kara Eliezer Bottom, MS Exercise Physiologist    Virtual Visit No    Medication changes reported     No    Fall or balance concerns reported    No    Warm-up and Cool-down Performed on first and last piece of equipment    Resistance Training Performed Yes    VAD Patient? No    PAD/SET Patient? No      Pain Assessment   Currently in Pain? No/denies              Social History   Tobacco Use  Smoking Status Never Smoker  Smokeless Tobacco Never Used    Goals Met:  Independence with exercise equipment Exercise tolerated well No report of cardiac concerns or symptoms Strength training completed today  Goals Unmet:  Not Applicable  Comments: Pt able to follow exercise prescription today without complaint.  Will continue to monitor for progression.    Dr. Emily Filbert is Medical Director for [place] and [facility].

## 2020-05-20 ENCOUNTER — Other Ambulatory Visit: Payer: Self-pay

## 2020-05-20 DIAGNOSIS — Z955 Presence of coronary angioplasty implant and graft: Secondary | ICD-10-CM | POA: Diagnosis not present

## 2020-05-20 DIAGNOSIS — I214 Non-ST elevation (NSTEMI) myocardial infarction: Secondary | ICD-10-CM

## 2020-05-20 NOTE — Progress Notes (Signed)
Daily Session Note  Patient Details  Name: [name] MRN: [id number] Date of Birth: Jan 22, 1931 Referring Provider:   Flowsheet Row Cardiac Rehab from 03/18/2020 in [facility] Cardiac and Pulmonary Rehab  Referring Provider Paraschos      Encounter Date: 05/20/2020  Check In:  Session Check In - 05/20/20 1545      Check-In   Supervising physician immediately available to respond to emergencies See telemetry face sheet for immediately available ER MD    Location [facility]-Cardiac & Pulmonary Rehab    Staff Present Birdie Sons, MPA, Mauricia Area, BS, ACSM CEP, Exercise Physiologist;Laureen Owens Shark, BS, RRT, CPFT    Virtual Visit No    Medication changes reported     No    Fall or balance concerns reported    No    Warm-up and Cool-down Performed on first and last piece of equipment    Resistance Training Performed Yes    VAD Patient? No    PAD/SET Patient? No      Pain Assessment   Currently in Pain? No/denies              Social History   Tobacco Use  Smoking Status Never Smoker  Smokeless Tobacco Never Used    Goals Met:  Independence with exercise equipment Exercise tolerated well No report of cardiac concerns or symptoms Strength training completed today  Goals Unmet:  Not Applicable  Comments: Pt able to follow exercise prescription today without complaint.  Will continue to monitor for progression.    Dr. Emily Filbert is Medical Director for [place] and [facility].

## 2020-05-23 ENCOUNTER — Encounter: Payer: Medicare Other | Attending: Cardiology | Admitting: *Deleted

## 2020-05-23 ENCOUNTER — Other Ambulatory Visit: Payer: Self-pay

## 2020-05-23 DIAGNOSIS — Z5189 Encounter for other specified aftercare: Secondary | ICD-10-CM | POA: Diagnosis not present

## 2020-05-23 DIAGNOSIS — I252 Old myocardial infarction: Secondary | ICD-10-CM | POA: Insufficient documentation

## 2020-05-23 DIAGNOSIS — I214 Non-ST elevation (NSTEMI) myocardial infarction: Secondary | ICD-10-CM

## 2020-05-23 DIAGNOSIS — Z955 Presence of coronary angioplasty implant and graft: Secondary | ICD-10-CM | POA: Diagnosis present

## 2020-05-23 NOTE — Progress Notes (Signed)
Daily Session Note  Patient Details  Name: [name] MRN: [id number] Date of Birth: 1931-03-11 Referring Provider:   Flowsheet Row Cardiac Rehab from 03/18/2020 in [facility] Cardiac and Pulmonary Rehab  Referring Provider Paraschos      Encounter Date: 05/23/2020  Check In:  Session Check In - 05/23/20 1537      Check-In   Supervising physician immediately available to respond to emergencies See telemetry face sheet for immediately available ER MD    Location [facility]-Cardiac & Pulmonary Rehab    Staff Present Renita Papa, RN BSN;Joseph [address] [place], [place], [place], CCRP, CCET    Virtual Visit No    Medication changes reported     No    Fall or balance concerns reported    No    Warm-up and Cool-down Performed on first and last piece of equipment    Resistance Training Performed Yes    VAD Patient? No    PAD/SET Patient? No      Pain Assessment   Currently in Pain? No/denies              Social History   Tobacco Use  Smoking Status Never Smoker  Smokeless Tobacco Never Used    Goals Met:  Independence with exercise equipment Exercise tolerated well No report of cardiac concerns or symptoms Strength training completed today  Goals Unmet:  Not Applicable  Comments: Pt able to follow exercise prescription today without complaint.  Will continue to monitor for progression.    Dr. Emily Filbert is Medical Director for [place] and [facility].

## 2020-05-27 ENCOUNTER — Other Ambulatory Visit: Payer: Self-pay

## 2020-05-27 DIAGNOSIS — I214 Non-ST elevation (NSTEMI) myocardial infarction: Secondary | ICD-10-CM

## 2020-05-27 DIAGNOSIS — Z955 Presence of coronary angioplasty implant and graft: Secondary | ICD-10-CM

## 2020-05-27 DIAGNOSIS — Z5189 Encounter for other specified aftercare: Secondary | ICD-10-CM | POA: Diagnosis not present

## 2020-05-27 NOTE — Progress Notes (Signed)
Daily Session Note  Patient Details  Name: [name] MRN: [id number] Date of Birth: 22-Feb-1931 Referring Provider:   Flowsheet Row Cardiac Rehab from 03/18/2020 in [facility] Cardiac and Pulmonary Rehab  Referring Provider Paraschos      Encounter Date: 05/27/2020  Check In:  Session Check In - 05/27/20 1546      Check-In   Supervising physician immediately available to respond to emergencies See telemetry face sheet for immediately available ER MD    Location [facility]-Cardiac & Pulmonary Rehab    Staff Present Birdie Sons, MPA, Mauricia Area, BS, ACSM CEP, Exercise Physiologist;Kara Eliezer Bottom, MS Exercise Physiologist    Virtual Visit No    Medication changes reported     No    Fall or balance concerns reported    No    Warm-up and Cool-down Performed on first and last piece of equipment    Resistance Training Performed Yes    VAD Patient? No    PAD/SET Patient? No      Pain Assessment   Currently in Pain? No/denies              Social History   Tobacco Use  Smoking Status Never Smoker  Smokeless Tobacco Never Used    Goals Met:  Independence with exercise equipment Exercise tolerated well No report of cardiac concerns or symptoms Strength training completed today  Goals Unmet:  Not Applicable  Comments: Pt able to follow exercise prescription today without complaint.  Will continue to monitor for progression.    Dr. Emily Filbert is Medical Director for [place] and [facility].

## 2020-05-29 ENCOUNTER — Other Ambulatory Visit: Payer: Self-pay

## 2020-05-29 DIAGNOSIS — Z955 Presence of coronary angioplasty implant and graft: Secondary | ICD-10-CM

## 2020-05-29 DIAGNOSIS — I214 Non-ST elevation (NSTEMI) myocardial infarction: Secondary | ICD-10-CM

## 2020-05-29 DIAGNOSIS — Z5189 Encounter for other specified aftercare: Secondary | ICD-10-CM | POA: Diagnosis not present

## 2020-05-29 NOTE — Progress Notes (Signed)
Daily Session Note  Patient Details  Name: [name] MRN: [id number] Date of Birth: Jun 23, 1930 Referring Provider:   Flowsheet Row Cardiac Rehab from 03/18/2020 in [facility] Cardiac and Pulmonary Rehab  Referring Provider Paraschos      Encounter Date: 05/29/2020  Check In:  Session Check In - 05/29/20 1538      Check-In   Supervising physician immediately available to respond to emergencies See telemetry face sheet for immediately available ER MD    Location [facility]-Cardiac & Pulmonary Rehab    Staff Present Birdie Sons, MPA, RN;Joseph Lou [place], [place] Exercise Physiologist    Virtual Visit No    Medication changes reported     No    Fall or balance concerns reported    No    Warm-up and Cool-down Performed on first and last piece of equipment    Resistance Training Performed Yes    VAD Patient? No    PAD/SET Patient? No      Pain Assessment   Currently in Pain? No/denies              Social History   Tobacco Use  Smoking Status Never Smoker  Smokeless Tobacco Never Used    Goals Met:  Independence with exercise equipment Exercise tolerated well No report of cardiac concerns or symptoms Strength training completed today  Goals Unmet:  Not Applicable  Comments: Pt able to follow exercise prescription today without complaint.  Will continue to monitor for progression.    Dr. Emily Filbert is Medical Director for [place] and [facility].

## 2020-06-03 ENCOUNTER — Other Ambulatory Visit: Payer: Self-pay

## 2020-06-03 DIAGNOSIS — Z955 Presence of coronary angioplasty implant and graft: Secondary | ICD-10-CM

## 2020-06-03 DIAGNOSIS — I214 Non-ST elevation (NSTEMI) myocardial infarction: Secondary | ICD-10-CM

## 2020-06-03 DIAGNOSIS — Z5189 Encounter for other specified aftercare: Secondary | ICD-10-CM | POA: Diagnosis not present

## 2020-06-03 NOTE — Progress Notes (Signed)
Daily Session Note  Patient Details  Name: [name] MRN: [id number] Date of Birth: 10/23/1930 Referring Provider:   Flowsheet Row Cardiac Rehab from 03/18/2020 in [facility] Cardiac and Pulmonary Rehab  Referring Provider Paraschos      Encounter Date: 06/03/2020  Check In:  Session Check In - 06/03/20 1538      Check-In   Supervising physician immediately available to respond to emergencies See telemetry face sheet for immediately available ER MD    Location [facility]-Cardiac & Pulmonary Rehab    Staff Present Birdie Sons, MPA, Mauricia Area, BS, ACSM CEP, Exercise Physiologist;Kara Eliezer Bottom, MS Exercise Physiologist    Virtual Visit No    Medication changes reported     No    Fall or balance concerns reported    No    Warm-up and Cool-down Performed on first and last piece of equipment    Resistance Training Performed Yes    VAD Patient? No    PAD/SET Patient? No      Pain Assessment   Currently in Pain? No/denies              Social History   Tobacco Use  Smoking Status Never Smoker  Smokeless Tobacco Never Used    Goals Met:  Independence with exercise equipment Exercise tolerated well No report of cardiac concerns or symptoms Strength training completed today  Goals Unmet:  Not Applicable  Comments: Pt able to follow exercise prescription today without complaint.  Will continue to monitor for progression.    Dr. Emily Filbert is Medical Director for [place] and [facility].

## 2020-06-05 ENCOUNTER — Other Ambulatory Visit: Payer: Self-pay

## 2020-06-05 DIAGNOSIS — Z955 Presence of coronary angioplasty implant and graft: Secondary | ICD-10-CM

## 2020-06-05 DIAGNOSIS — Z5189 Encounter for other specified aftercare: Secondary | ICD-10-CM | POA: Diagnosis not present

## 2020-06-05 DIAGNOSIS — I214 Non-ST elevation (NSTEMI) myocardial infarction: Secondary | ICD-10-CM

## 2020-06-05 NOTE — Progress Notes (Signed)
Daily Session Note  Patient Details  Name: [name] MRN: [id number] Date of Birth: 1931-02-22 Referring Provider:   Flowsheet Row Cardiac Rehab from 03/18/2020 in [facility] Cardiac and Pulmonary Rehab  Referring Provider Paraschos      Encounter Date: 06/05/2020  Check In:  Session Check In - 06/05/20 [place]      Check-In   Supervising physician immediately available to respond to emergencies See telemetry face sheet for immediately available ER MD    Location [facility]-Cardiac & Pulmonary Rehab    Staff Present Birdie Sons, MPA, RN;Joseph Lou [place], [place] Exercise Physiologist    Virtual Visit No    Medication changes reported     No    Fall or balance concerns reported    No    Warm-up and Cool-down Performed on first and last piece of equipment    Resistance Training Performed Yes    VAD Patient? No    PAD/SET Patient? No      Pain Assessment   Currently in Pain? No/denies              Social History   Tobacco Use  Smoking Status Never Smoker  Smokeless Tobacco Never Used    Goals Met:  Independence with exercise equipment Exercise tolerated well No report of cardiac concerns or symptoms Strength training completed today  Goals Unmet:  Not Applicable  Comments: Pt able to follow exercise prescription today without complaint.  Will continue to monitor for progression.    Dr. Emily Filbert is Medical Director for [place] and [facility].

## 2020-06-06 ENCOUNTER — Other Ambulatory Visit: Payer: Self-pay

## 2020-06-06 ENCOUNTER — Encounter: Payer: Medicare Other | Admitting: *Deleted

## 2020-06-06 DIAGNOSIS — Z955 Presence of coronary angioplasty implant and graft: Secondary | ICD-10-CM

## 2020-06-06 DIAGNOSIS — Z5189 Encounter for other specified aftercare: Secondary | ICD-10-CM | POA: Diagnosis not present

## 2020-06-06 DIAGNOSIS — I214 Non-ST elevation (NSTEMI) myocardial infarction: Secondary | ICD-10-CM

## 2020-06-06 NOTE — Progress Notes (Signed)
Daily Session Note  Patient Details  Name: [name] MRN: [id number] Date of Birth: 1930-07-07 Referring Provider:   Flowsheet Row Cardiac Rehab from 03/18/2020 in [facility] Cardiac and Pulmonary Rehab  Referring Provider Paraschos      Encounter Date: 06/06/2020  Check In:  Session Check In - 06/06/20 1550      Check-In   Supervising physician immediately available to respond to emergencies See telemetry face sheet for immediately available ER MD    Location [facility]-Cardiac & Pulmonary Rehab    Staff Present Renita Papa, RN BSN;Joseph [address] [place], [place], [place], CCRP, [place], [place], ACSM CEP, Exercise Physiologist    Virtual Visit No    Medication changes reported     No    Fall or balance concerns reported    No    Warm-up and Cool-down Performed on first and last piece of equipment    Resistance Training Performed Yes    VAD Patient? No    PAD/SET Patient? No      Pain Assessment   Currently in Pain? No/denies              Social History   Tobacco Use  Smoking Status Never Smoker  Smokeless Tobacco Never Used    Goals Met:  Independence with exercise equipment Exercise tolerated well No report of cardiac concerns or symptoms Strength training completed today  Goals Unmet:  Not Applicable  Comments: Pt able to follow exercise prescription today without complaint.  Will continue to monitor for progression.    Dr. Emily Filbert is Medical Director for [place] and [facility].

## 2020-06-10 ENCOUNTER — Other Ambulatory Visit: Payer: Self-pay

## 2020-06-10 DIAGNOSIS — Z955 Presence of coronary angioplasty implant and graft: Secondary | ICD-10-CM

## 2020-06-10 DIAGNOSIS — I214 Non-ST elevation (NSTEMI) myocardial infarction: Secondary | ICD-10-CM

## 2020-06-10 DIAGNOSIS — Z5189 Encounter for other specified aftercare: Secondary | ICD-10-CM | POA: Diagnosis not present

## 2020-06-10 NOTE — Progress Notes (Signed)
Daily Session Note  Patient Details  Name: [name] MRN: [id number] Date of Birth: 12/16/1930 Referring Provider:   Flowsheet Row Cardiac Rehab from 03/18/2020 in [facility]  Referring Provider Paraschos      Encounter Date: 06/10/2020  Check In:  Session Check In - 06/10/20 1601      Check-In   Supervising physician immediately available to respond to emergencies See telemetry face sheet for immediately available ER MD    Location [facility]    Staff Present  , MPA, RN; Hayes, BS, ACSM CEP, Exercise Physiologist;Kara Langdon, MS Exercise Physiologist    Virtual Visit No    Medication changes reported     No    Fall or balance concerns reported    No    Warm-up and Cool-down Performed on first and last piece of equipment    Resistance Training Performed Yes    VAD Patient? No    PAD/SET Patient? No      Pain Assessment   Currently in Pain? No/denies              Social History   Tobacco Use  Smoking Status Never Smoker  Smokeless Tobacco Never Used    Goals Met:  Independence with exercise equipment Exercise tolerated well No report of cardiac concerns or symptoms Strength training completed today  Goals Unmet:  Not Applicable  Comments: Pt able to follow exercise prescription today without complaint.  Will continue to monitor for progression.    Dr. Mark Miller is Medical Director for [facility] and [facility]. 

## 2020-06-12 ENCOUNTER — Other Ambulatory Visit: Payer: Self-pay

## 2020-06-12 ENCOUNTER — Encounter: Payer: Self-pay | Admitting: *Deleted

## 2020-06-12 DIAGNOSIS — I214 Non-ST elevation (NSTEMI) myocardial infarction: Secondary | ICD-10-CM

## 2020-06-12 DIAGNOSIS — Z5189 Encounter for other specified aftercare: Secondary | ICD-10-CM | POA: Diagnosis not present

## 2020-06-12 DIAGNOSIS — Z955 Presence of coronary angioplasty implant and graft: Secondary | ICD-10-CM

## 2020-06-12 NOTE — Progress Notes (Signed)
Daily Session Note  Patient Details  Name: [name] MRN: [id number] Date of Birth: May 27, 1930 Referring Provider:   Flowsheet Row Cardiac Rehab from 03/18/2020 in [facility]  Referring Provider Paraschos      Encounter Date: 06/12/2020  Check In:  Session Check In - 06/12/20 1553      Check-In   Supervising physician immediately available to respond to emergencies See telemetry face sheet for immediately available ER MD    Location [facility]    Staff Present Birdie Sons, MPA, RN;Joseph Lou Miner, [place] Exercise Physiologist    Virtual Visit No    Medication changes reported     No    Fall or balance concerns reported    No    Warm-up and Cool-down Performed on first and last piece of equipment    Resistance Training Performed Yes    VAD Patient? No    PAD/SET Patient? No      Pain Assessment   Currently in Pain? No/denies              Social History   Tobacco Use  Smoking Status Never Smoker  Smokeless Tobacco Never Used    Goals Met:  Independence with exercise equipment Exercise tolerated well No report of cardiac concerns or symptoms Strength training completed today  Goals Unmet:  Not Applicable  Comments: Pt able to follow exercise prescription today without complaint.  Will continue to monitor for progression.  Reviewed home exercise with pt today.  Pt plans to walk, youtube videos, resistance bands for exercise.  Reviewed THR, pulse, RPE, sign and symptoms, pulse oximetery and when to call 911 or MD.  Also discussed weather considerations and indoor options.  Pt voiced understanding.   Dr. Emily Filbert is Medical Director for [facility] and [facility].

## 2020-06-12 NOTE — Progress Notes (Signed)
Cardiac Individual Treatment Plan  Patient Details  Name: [name] MRN: [id number] Date of Birth: 1931-03-24 Referring Provider:   Flowsheet Row Cardiac Rehab from 03/18/2020 in [facility] Cardiac and Pulmonary Rehab  Referring Provider Paraschos      Initial Encounter Date:  Flowsheet Row Cardiac Rehab from 03/18/2020 in [facility] Cardiac and Pulmonary Rehab  Date 03/18/20      Visit Diagnosis: Status post coronary artery stent placement  NSTEMI (non-ST elevated myocardial infarction) [facility])  Patient's Home Medications on Admission:  Current Outpatient Medications:  .  acetaminophen (TYLENOL) 500 MG tablet, Take 500 mg by mouth every 6 (six) hours as needed., Disp: , Rfl:  .  amLODipine (NORVASC) 5 MG tablet, Take 5 mg by mouth daily., Disp: , Rfl:  .  aspirin EC 81 MG tablet, Take 81 mg by mouth daily., Disp: , Rfl:  .  cloNIDine (CATAPRES - DOSED IN MG/24 HR) 0.1 mg/24hr patch, 0.1 mg once a week., Disp: , Rfl:  .  clopidogrel (PLAVIX) 75 MG tablet, Take 75 mg by mouth daily., Disp: , Rfl:  .  Dextromethorphan-guaiFENesin (MUCINEX DM PO), Take by mouth., Disp: , Rfl:  .  donepezil (ARICEPT) 5 MG tablet, Take 5 mg by mouth daily., Disp: , Rfl:  .  fluticasone (FLONASE) 50 MCG/ACT nasal spray, Place into both nostrils daily., Disp: , Rfl:  .  gabapentin (NEURONTIN) 100 MG capsule, Take 100 mg by mouth 3 (three) times daily. , Disp: , Rfl:  .  glimepiride (AMARYL) 1 MG tablet, Take 1 mg by mouth daily with breakfast. (Patient not taking: Reported on 03/08/2020), Disp: , Rfl:  .  isosorbide mononitrate (IMDUR) 60 MG 24 hr tablet, Take 60 mg by mouth daily., Disp: , Rfl:  .  magnesium oxide (MAG-OX) 400 MG tablet, Take 400 mg by mouth daily. , Disp: , Rfl:  .  metoprolol tartrate (LOPRESSOR) 25 MG tablet, Take 25 mg by mouth 2 (two) times daily. (Patient not taking: Reported on 03/08/2020), Disp: , Rfl:  .  Multiple Vitamins-Minerals (MULTIVITAMIN GUMMIES ADULT PO), Take by mouth., Disp: ,  Rfl:  .  nitroGLYCERIN (NITROSTAT) 0.4 MG SL tablet, Place 0.4 mg under the tongue every 5 (five) minutes as needed. For chest pain, Disp: , Rfl:  .  omeprazole (PRILOSEC) 20 MG capsule, Take 20 mg by mouth daily. , Disp: , Rfl:  .  polyethylene glycol (MIRALAX / GLYCOLAX) 17 g packet, Take 17 g by mouth daily as needed. (Patient not taking: Reported on 03/08/2020), Disp: , Rfl:  .  rosuvastatin (CRESTOR) 20 MG tablet, Take 20 mg by mouth daily., Disp: , Rfl:  .  spironolactone (ALDACTONE) 25 MG tablet, Take 25 mg by mouth daily., Disp: , Rfl:  .  timolol (TIMOPTIC) 0.5 % ophthalmic solution, Place 1 drop into both eyes at bedtime., Disp: , Rfl:  .  tiZANidine (ZANAFLEX) 2 MG tablet, Take 2 mg by mouth 3 (three) times daily.  (Patient not taking: Reported on 03/08/2020), Disp: , Rfl:  .  torsemide (DEMADEX) 20 MG tablet, Take 30 mg by mouth every evening. , Disp: , Rfl:   Past Medical History: Past Medical History:  Diagnosis Date  . Hypercholesteremia   . Hypertension   . Myocardial infarction (HCC) 2008  . S/P angioplasty with stent     Tobacco Use: Social History   Tobacco Use  Smoking Status Never Smoker  Smokeless Tobacco Never Used    Labs: Recent Review Contractor for  [facility] Cardiac and Pulmonary Rehab Latest Ref Rng & Units 12/08/2011 08/15/2012 11/18/2019   Cholestrol 0 - 200 mg/dL - 245 809   LDLCALC 0 - 99 mg/dL - 81 983(J)   HDL >82 mg/dL - 50(N) 52   Trlycerides <150 mg/dL - 397(Q) 92   Hemoglobin A1c 4.8 - 5.6 % - 7.5(H) 6.5(H)   TCO2 0 - 100 mmol/L 26 - -       Exercise Target Goals: Exercise Program Goal: Individual exercise prescription set using results from initial 6 min walk test and THRR while considering  patient's activity barriers and safety.   Exercise Prescription Goal: Initial exercise prescription builds to 30-45 minutes a day of aerobic activity, 2-3 days per week.  Home exercise guidelines will be given to patient during program as part  of exercise prescription that the participant will acknowledge.   Education: Aerobic Exercise: - Group verbal and visual presentation on the components of exercise prescription. Introduces F.I.T.T principle from ACSM for exercise prescriptions.  Reviews F.I.T.T. principles of aerobic exercise including progression. Written material given at graduation. Flowsheet Row Cardiac Rehab from 05/29/2020 in [facility] Cardiac and Pulmonary Rehab  Date 04/17/20  Educator AS  Instruction Review Code 1- Verbalizes Understanding      Education: Resistance Exercise: - Group verbal and visual presentation on the components of exercise prescription. Introduces F.I.T.T principle from ACSM for exercise prescriptions  Reviews F.I.T.T. principles of resistance exercise including progression. Written material given at graduation.    Education: Exercise & Equipment Safety: - Individual verbal instruction and demonstration of equipment use and safety with use of the equipment.   Education: Exercise Physiology & General Exercise Guidelines: - Group verbal and written instruction with models to review the exercise physiology of the cardiovascular system and associated critical values. Provides general exercise guidelines with specific guidelines to those with heart or lung disease.  Flowsheet Row Cardiac Rehab from 05/29/2020 in [facility] Cardiac and Pulmonary Rehab  Date 05/29/20  Educator AS  Instruction Review Code 1- Verbalizes Understanding      Education: Flexibility, Balance, Mind/Body Relaxation: - Group verbal and visual presentation with interactive activity on the components of exercise prescription. Introduces F.I.T.T principle from ACSM for exercise prescriptions. Reviews F.I.T.T. principles of flexibility and balance exercise training including progression. Also discusses the mind body connection.  Reviews various relaxation techniques to help reduce and manage stress (i.e. Deep breathing, progressive muscle  relaxation, and visualization). Balance handout provided to take home. Written material given at graduation. Flowsheet Row Cardiac Rehab from 05/29/2020 in [facility] Cardiac and Pulmonary Rehab  Date 04/24/20  Educator AS  Instruction Review Code 1- Verbalizes Understanding      Activity Barriers & Risk Stratification:  Activity Barriers & Cardiac Risk Stratification - 03/08/20 1518      Activity Barriers & Cardiac Risk Stratification   Activity Barriers Assistive Device;Back Problems;Neck/Spine Problems;Muscular Weakness    Cardiac Risk Stratification High           6 Minute Walk:  6 Minute Walk    Row Name 03/18/20 1531         6 Minute Walk   Phase Initial     Distance 765 feet     Walk Time 5.5 minutes     # of Rest Breaks 1     MPH 1.58     METS 1.07     RPE 13     Perceived Dyspnea  1     VO2 Peak 3.75  Symptoms Yes (comment)     Comments leg fatigue     Resting HR 64 bpm     Resting BP 138/50     Resting Oxygen Saturation  100 %     Exercise Oxygen Saturation  during 6 min walk 99 %     Max Ex. HR 91 bpm     Max Ex. BP 154/50     2 Minute Post BP 124/50            Oxygen Initial Assessment:   Oxygen Re-Evaluation:   Oxygen Discharge (Final Oxygen Re-Evaluation):   Initial Exercise Prescription:  Initial Exercise Prescription - 03/18/20 1500      Date of Initial Exercise RX and Referring Provider   Date 03/18/20    Referring Provider Paraschos      Treadmill   MPH 1    Grade 0    Minutes 15    METs 1.77      Recumbant Bike   Level 1    RPM 60    Minutes 15    METs 1      NuStep   Level 1    SPM 80    Minutes 15    METs 1      REL-XR   Level 1    Speed 50    Minutes 15    METs 1      Biostep-RELP   Level 1    SPM 50    Minutes 15    METs 1      Prescription Details   Frequency (times per week) 3    Duration Progress to 30 minutes of continuous aerobic without signs/symptoms of physical distress      Intensity   THRR  40-80% of Max Heartrate 92-119    Ratings of Perceived Exertion 11-15    Perceived Dyspnea 0-4      Resistance Training   Training Prescription Yes    Weight 3 lb    Reps 10-15           Perform Capillary Blood Glucose checks as needed.  Exercise Prescription Changes:  Exercise Prescription Changes    Row Name 03/18/20 1500 04/03/20 1400 04/17/20 0800 04/29/20 1100 05/13/20 1600     Response to Exercise   Blood Pressure (Admit) 138/50 156/70 142/68 128/58 --   Blood Pressure (Exercise) 154/50 142/62 180/64 130/60 --   Blood Pressure (Exit) 124/50 150/68 132/68 126/58 --   Heart Rate (Admit) 64 bpm 83 bpm 78 bpm 81 bpm --   Heart Rate (Exercise) 91 bpm 87 bpm 101 bpm 77 bpm --   Heart Rate (Exit) 64 bpm 71 bpm 81 bpm 62 bpm --   Oxygen Saturation (Admit) 100 % -- -- -- --   Oxygen Saturation (Exercise) 99 % -- -- -- --   Rating of Perceived Exertion (Exercise) 13 11 13 11  --   Perceived Dyspnea (Exercise) 1 -- -- -- --   Symptoms leg fatigue fatigue -- hips hurting on treadmill --   Duration -- Continue with 30 min of aerobic exercise without signs/symptoms of physical distress. Continue with 30 min of aerobic exercise without signs/symptoms of physical distress. Continue with 30 min of aerobic exercise without signs/symptoms of physical distress. --   Intensity -- THRR unchanged THRR unchanged THRR unchanged --     Progression   Progression -- Continue to progress workloads to maintain intensity without signs/symptoms of physical distress. Continue to progress workloads to maintain intensity without signs/symptoms  of physical distress. Continue to progress workloads to maintain intensity without signs/symptoms of physical distress. --   Average METs -- 1.7 2.1 1.66 --     Resistance Training   Training Prescription -- Yes Yes Yes --   Weight -- 3 lb 2 lb 2 lb --   Reps -- 10-15 10-15 10-15 --     Interval Training   Interval Training -- No No No --     Treadmill   MPH  -- -- -- 1 --   Grade -- -- -- 0 --   Minutes -- -- -- 2 --   METs -- -- -- 1.77 --     NuStep   Level -- 1 1 1  --   SPM -- -- 80 -- --   Minutes -- 15  has gone up to 30 min 15 15 --   METs -- 1.4 2.1 1.5 --     T5 Nustep   Level -- -- -- 1 --   Minutes -- -- -- 15 --   METs -- -- -- 1.7 --     Biostep-RELP   Level -- 1 -- -- --   Minutes -- 15 -- -- --   METs -- 2 -- -- --     Home Exercise Plan   Plans to continue exercise at -- -- -- -- Home (comment)   Frequency -- -- -- -- Add 1 additional day to program exercise sessions.   Initial Home Exercises Provided -- -- -- -- 05/13/20   Row Name 05/14/20 1200 05/27/20 0900 06/11/20 1200         Response to Exercise   Blood Pressure (Admit) 142/60 134/64 138/60     Blood Pressure (Exercise) 152/80 138/64 148/80     Blood Pressure (Exit) 140/62 124/62 140/58     Heart Rate (Admit) 65 bpm 79 bpm 70 bpm     Heart Rate (Exercise) 89 bpm 79 bpm 90 bpm     Heart Rate (Exit) 76 bpm 54 bpm 78 bpm     Rating of Perceived Exertion (Exercise) 11 13 13      Symptoms -- none none     Duration Progress to 30 minutes of  aerobic without signs/symptoms of physical distress Continue with 30 min of aerobic exercise without signs/symptoms of physical distress. Continue with 30 min of aerobic exercise without signs/symptoms of physical distress.     Intensity THRR unchanged THRR unchanged THRR unchanged           Progression   Progression Continue to progress workloads to maintain intensity without signs/symptoms of physical distress. Continue to progress workloads to maintain intensity without signs/symptoms of physical distress. Continue to progress workloads to maintain intensity without signs/symptoms of physical distress.     Average METs 1.5 1.8 2           Resistance Training   Training Prescription Yes Yes Yes     Weight 2 lb 3 lb 3 lb     Reps 10-15 10-15 10-15           Interval Training   Interval Training No No No            NuStep   Level 1 1 3      SPM -- -- 80     Minutes 30 30 15      METs 1.5 1.6 2           Biostep-RELP   Level -- 1 --  Minutes -- 15 --     METs -- 2 --           Home Exercise Plan   Plans to continue exercise at -- Home (comment) --     Frequency -- Add 1 additional day to program exercise sessions. --     Initial Home Exercises Provided -- 05/13/20 --            Exercise Comments:  Exercise Comments    Row Name 03/20/20 1552           Exercise Comments First full day of exercise!  Patient was oriented to gym and equipment including functions, settings, policies, and procedures.  Patient's individual exercise prescription and treatment plan were reviewed.  All starting workloads were established based on the results of the 6 minute walk test done at initial orientation visit.  The plan for exercise progression was also introduced and progression will be customized based on patient's performance and goals.              Exercise Goals and Review:  Exercise Goals    Row Name 03/18/20 1537             Exercise Goals   Increase Physical Activity Yes       Intervention Provide advice, education, support and counseling about physical activity/exercise needs.;Develop an individualized exercise prescription for aerobic and resistive training based on initial evaluation findings, risk stratification, comorbidities and participant's personal goals.       Expected Outcomes Short Term: Attend rehab on a regular basis to increase amount of physical activity.;Long Term: Add in home exercise to make exercise part of routine and to increase amount of physical activity.;Long Term: Exercising regularly at least 3-5 days a week.       Increase Strength and Stamina Yes       Intervention Provide advice, education, support and counseling about physical activity/exercise needs.;Develop an individualized exercise prescription for aerobic and resistive training based on initial  evaluation findings, risk stratification, comorbidities and participant's personal goals.       Expected Outcomes Short Term: Increase workloads from initial exercise prescription for resistance, speed, and METs.;Short Term: Perform resistance training exercises routinely during rehab and add in resistance training at home;Long Term: Improve cardiorespiratory fitness, muscular endurance and strength as measured by increased METs and functional capacity ( )       Able to understand and use rate of perceived exertion (RPE) scale Yes       Intervention Provide education and explanation on how to use RPE scale       Expected Outcomes Short Term: Able to use RPE daily in rehab to express subjective intensity level;Long Term:  Able to use RPE to guide intensity level when exercising independently       Able to understand and use Dyspnea scale Yes       Intervention Provide education and explanation on how to use Dyspnea scale       Expected Outcomes Short Term: Able to use Dyspnea scale daily in rehab to express subjective sense of shortness of breath during exertion;Long Term: Able to use Dyspnea scale to guide intensity level when exercising independently       Knowledge and understanding of Target Heart Rate Range (THRR) Yes       Intervention Provide education and explanation of THRR including how the numbers were predicted and where they are located for reference       Expected Outcomes Short Term: Able to  state/look up THRR;Short Term: Able to use daily as guideline for intensity in rehab;Long Term: Able to use THRR to govern intensity when exercising independently       Able to check pulse independently Yes       Intervention Provide education and demonstration on how to check pulse in carotid and radial arteries.;Review the importance of being able to check your own pulse for safety during independent exercise       Expected Outcomes Short Term: Able to explain why pulse checking is important  during independent exercise;Long Term: Able to check pulse independently and accurately       Understanding of Exercise Prescription Yes       Intervention Provide education, explanation, and written materials on patient's individual exercise prescription       Expected Outcomes Short Term: Able to explain program exercise prescription;Long Term: Able to explain home exercise prescription to exercise independently              Exercise Goals Re-Evaluation :  Exercise Goals Re-Evaluation    Row Name 03/20/20 1552 04/03/20 1347 04/08/20 1554 04/17/20 0818 04/29/20 1143     Exercise Goal Re-Evaluation   Exercise Goals Review Increase Physical Activity;Able to understand and use rate of perceived exertion (RPE) scale;Knowledge and understanding of Target Heart Rate Range (THRR);Understanding of Exercise Prescription;Able to understand and use Dyspnea scale;Increase Strength and Stamina;Able to check pulse independently Increase Physical Activity;Increase Strength and Stamina;Understanding of Exercise Prescription -- Increase Physical Activity;Increase Strength and Stamina Increase Physical Activity;Increase Strength and Stamina;Understanding of Exercise Prescription   Comments Reviewed RPE and dyspnea scales, THR and program prescription with pt today.  Pt voiced understanding and was given a copy of goals to take home. Breella is off to a good start in rehab.  She is getting her full 30 min of exericse now.  We will continue to montior her progress. Felix reports some mid to low back pain on machines.  A pillow behind back seems to help. Gianna has attended consistently this month.  She has reached her THR range on machines.  Staff will monitor progress. Sefora has been doing well in rehab.  She is up to 2 min on the treadmill!  We will continue to monitor her progress.   Expected Outcomes Short: Use RPE daily to regulate intensity. Long: Follow program prescription in THR. Short: Continue to attend  regularly Long: Continue to follow progream prescription Short:  try machines without arms Long: build overall stamina Short: continue to attend consistently Long:  build stamina Short: Increase level on NuStep Long: Continue to improve stamina   Row Name 05/13/20 1548 05/13/20 1641 05/23/20 1541 06/11/20 1238       Exercise Goal Re-Evaluation   Exercise Goals Review -- Increase Physical Activity;Increase Strength and Stamina Increase Physical Activity;Increase Strength and Stamina;Understanding of Exercise Prescription Increase Physical Activity;Increase Strength and Stamina    Comments -- Reviewed home exercise with pt today.  Pt plans to use staff videos and consider [employer] for exercise.  Reviewed THR, pulse, RPE, sign and symptoms, pulse oximetery and when to call 911 or MD.  Also discussed weather considerations and indoor options.  Pt voiced understanding. Kadance is doing well in rehab.  She says that she cannot really tell much difference yet. She has not started home exercise yet.  We talked about using the staff videos and her son was also present to listen in to help her at home. Abygayle uses the T4 nustep every session.  She has increased her level to 3.  She is doing as well as can be expected.    Expected Outcomes -- Short: begin exercising one day per week outside program sessions Short: Start to add in staff video exercises at home.  Long: Continue to improve stamina. Short: try strength videos at home Long: build overall stamina           Discharge Exercise Prescription (Final Exercise Prescription Changes):  Exercise Prescription Changes - 06/11/20 1200      Response to Exercise   Blood Pressure (Admit) 138/60    Blood Pressure (Exercise) 148/80    Blood Pressure (Exit) 140/58    Heart Rate (Admit) 70 bpm    Heart Rate (Exercise) 90 bpm    Heart Rate (Exit) 78 bpm    Rating of Perceived Exertion (Exercise) 13    Symptoms none    Duration Continue with 30 min of aerobic  exercise without signs/symptoms of physical distress.    Intensity THRR unchanged      Progression   Progression Continue to progress workloads to maintain intensity without signs/symptoms of physical distress.    Average METs 2      Resistance Training   Training Prescription Yes    Weight 3 lb    Reps 10-15      Interval Training   Interval Training No      NuStep   Level 3    SPM 80    Minutes 15    METs 2           Nutrition:  Target Goals: Understanding of nutrition guidelines, daily intake of sodium 1500mg , cholesterol 200mg , calories 30% from fat and 7% or less from saturated fats, daily to have 5 or more servings of fruits and vegetables.  Education: All About Nutrition: -Group instruction provided by verbal, written material, interactive activities, discussions, models, and posters to present general guidelines for heart healthy nutrition including fat, fiber, MyPlate, the role of sodium in heart healthy nutrition, utilization of the nutrition label, and utilization of this knowledge for meal planning. Follow up email sent as well. Written material given at graduation.   Biometrics:  Pre Biometrics - 03/18/20 1540      Pre Biometrics   Height 5' 1.5" (1.562 m)    Weight 139 lb 12.8 oz (63.4 kg)    BMI (Calculated) 25.99            Nutrition Therapy Plan and Nutrition Goals:  Nutrition Therapy & Goals - 06/03/20 1544      Nutrition Therapy   Diet Heart healthy, low Na    Drug/Food Interactions Statins/Certain Fruits    Protein (specify units) 50g    Fiber 25 grams    Whole Grain Foods 3 servings    Saturated Fats 12 max. grams    Fruits and Vegetables 8 servings/day    Sodium 1.5 grams      Personal Nutrition Goals   Nutrition Goal ST:LT: Cambry would not like to make any changes at this time    Comments B: cereal (cinnamon toast crunch, cherrios - skim milk or 1% milk), toast (white) - PB and J, coffee (creamer), orange juice S: candy - sugarfree  D: go out - hardies, wendys, cafeteria, cracker barrell Drinks: water Her sister is very picky who she lives with. She will cook a meal on sunday for dinner like meatloaf or fish. She doesn't eat much chicken. She is a retired Char[employer] fundraiser and knows her diet isn't perfect  and she feels this is easy for her. Discussed heart healthy eating. Her son goes food shopping for her. Discussed heart healthy eating and small modifications such as choosing more vegetables and less red meat when going out. Will continue to check in - she does not want to make any chnages at this time.      Intervention Plan   Intervention Prescribe, educate and counsel regarding individualized specific dietary modifications aiming towards targeted core components such as weight, hypertension, lipid management, diabetes, heart failure and other comorbidities.;Nutrition handout(s) given to patient.    Expected Outcomes Short Term Goal: Understand basic principles of dietary content, such as calories, fat, sodium, cholesterol and nutrients.;Short Term Goal: A plan has been developed with personal nutrition goals set during dietitian appointment.;Long Term Goal: Adherence to prescribed nutrition plan.           Nutrition Assessments:  MEDIFICTS Score Key:  ?70 Need to make dietary changes   40-70 Heart Healthy Diet  ? 40 Therapeutic Level Cholesterol Diet  Flowsheet Row Cardiac Rehab from 03/18/2020 in [facility] Cardiac and Pulmonary Rehab  Picture Your Plate Total Score on Admission 54     Picture Your Plate Scores:  <40 Unhealthy dietary pattern with much room for improvement.  41-50 Dietary pattern unlikely to meet recommendations for good health and room for improvement.  51-60 More healthful dietary pattern, with some room for improvement.   >60 Healthy dietary pattern, although there may be some specific behaviors that could be improved.    Nutrition Goals Re-Evaluation:  Nutrition Goals Re-Evaluation    Row Name 05/13/20  1555 05/23/20 1549           Goals   Nutrition Goal -- Meet with nutritionist      Comment Scheduled nutrition appointment She doesn't cook as much as she used to.  There are theee people in her house and they all eat different.  She wants to maintain weight.      Expected Outcome -- Meet with nutritionist             Nutrition Goals Discharge (Final Nutrition Goals Re-Evaluation):  Nutrition Goals Re-Evaluation - 05/23/20 1549      Goals   Nutrition Goal Meet with nutritionist    Comment She doesn't cook as much as she used to.  There are theee people in her house and they all eat different.  She wants to maintain weight.    Expected Outcome Meet with nutritionist           Psychosocial: Target Goals: Acknowledge presence or absence of significant depression and/or stress, maximize coping skills, provide positive support system. Participant is able to verbalize types and ability to use techniques and skills needed for reducing stress and depression.   Education: Stress, Anxiety, and Depression - Group verbal and visual presentation to define topics covered.  Reviews how body is impacted by stress, anxiety, and depression.  Also discusses healthy ways to reduce stress and to treat/manage anxiety and depression.  Written material given at graduation.   Education: Sleep Hygiene -Provides group verbal and written instruction about how sleep can affect your health.  Define sleep hygiene, discuss sleep cycles and impact of sleep habits. Review good sleep hygiene tips.    Initial Review & Psychosocial Screening:  Initial Psych Review & Screening - 03/08/20 1521      Initial Review   Current issues with Current Stress Concerns    Source of Stress Concerns Unable to perform yard/household activities;Chronic Illness  Family Dynamics   Good Support System? Yes   son, sister     Barriers   Psychosocial barriers to participate in program There are no identifiable barriers or  psychosocial needs.      Screening Interventions   Interventions Encouraged to exercise;Provide feedback about the scores to participant;To provide support and resources with identified psychosocial needs    Expected Outcomes Short Term goal: Utilizing psychosocial counselor, staff and physician to assist with identification of specific Stressors or current issues interfering with healing process. Setting desired goal for each stressor or current issue identified.;Long Term Goal: Stressors or current issues are controlled or eliminated.;Short Term goal: Identification and review with participant of any Quality of Life or Depression concerns found by scoring the questionnaire.;Long Term goal: The participant improves quality of Life and PHQ9 Scores as seen by post scores and/or verbalization of changes           Quality of Life Scores:   Quality of Life - 03/18/20 1544      Quality of Life   Select Quality of Life      Quality of Life Scores   Health/Function Pre 26.4 %    Socioeconomic Pre 26 %    Psych/Spiritual Pre 26.57 %    Family Pre 28.5 %    GLOBAL Pre 26.66 %          Scores of 19 and below usually indicate a poorer quality of life in these areas.  A difference of  2-3 points is a clinically meaningful difference.  A difference of 2-3 points in the total score of the Quality of Life Index has been associated with significant improvement in overall quality of life, self-image, physical symptoms, and general health in studies assessing change in quality of life.  PHQ-9: Recent Review Flowsheet Data    Depression screen [facility] 2/9 03/18/2020   Decreased Interest 1   Down, Depressed, Hopeless 0   PHQ - 2 Score 1   Altered sleeping 0   Tired, decreased energy 1   Change in appetite 0   Feeling bad or failure about yourself  0   Trouble concentrating 0   Moving slowly or fidgety/restless 0   Suicidal thoughts 0   PHQ-9 Score 2   Difficult doing work/chores Not difficult at  all     Interpretation of Total Score  Total Score Depression Severity:  1-4 = Minimal depression, 5-9 = Mild depression, 10-14 = Moderate depression, 15-19 = Moderately severe depression, 20-27 = Severe depression   Psychosocial Evaluation and Intervention:  Psychosocial Evaluation - 04/08/20 1602      Psychosocial Evaluation & Interventions   Comments Today is Noela's 5 th session.  Her neck and back still bothers her.  She doesnt report any signs of depression or anxiety.           Psychosocial Re-Evaluation:  Psychosocial Re-Evaluation    Row Name 04/08/20 1602 04/08/20 1606 05/13/20 1550 05/23/20 1547       Psychosocial Re-Evaluation   Current issues with Current Stress Concerns None Identified None Identified None Identified    Comments -- Today is Naidelin's 5 th session.  Her neck and back still bothers her.  She doesnt report any signs of depression or anxiety. She reports feeling tense sometimes- she reports being prescribed medication (xanax) for it but doesn't take it all the time - she reports not liking to. She relaxes by reclining in the chair at home, she loves sudoku and word finds. She  reports calling friends for support. Lanautica is doing well in rehab.  She feels like she is doing well mentally and denies any major stressors.  She is sleeping good when she sleeps.  She also naps in the chair when she rest.    Expected Outcomes -- Short: follow up with Dr about back and neck Long: maintain positive outlook ST: Continue to use medication as needed LT: mainain positive outlook Short: Continue to work on sleep Long: Continue to stay positive.    Interventions -- -- Encouraged to attend Cardiac Rehabilitation for the exercise Encouraged to attend Cardiac Rehabilitation for the exercise    Continue Psychosocial Services  -- -- Follow up required by staff Follow up required by staff         Initial Review   Source of Stress Concerns -- -- Unable to perform yard/household  activities;Chronic Illness --           Psychosocial Discharge (Final Psychosocial Re-Evaluation):  Psychosocial Re-Evaluation - 05/23/20 1547      Psychosocial Re-Evaluation   Current issues with None Identified    Comments Zeyna is doing well in rehab.  She feels like she is doing well mentally and denies any major stressors.  She is sleeping good when she sleeps.  She also naps in the chair when she rest.    Expected Outcomes Short: Continue to work on sleep Long: Continue to stay positive.    Interventions Encouraged to attend Cardiac Rehabilitation for the exercise    Continue Psychosocial Services  Follow up required by staff           Vocational Rehabilitation: Provide vocational rehab assistance to qualifying candidates.   Vocational Rehab Evaluation & Intervention:  Vocational Rehab - 03/08/20 1521      Initial Vocational Rehab Evaluation & Intervention   Assessment shows need for Vocational Rehabilitation No           Education: Education Goals: Education classes will be provided on a variety of topics geared toward better understanding of heart health and risk factor modification. Participant will state understanding/return demonstration of topics presented as noted by education test scores.  Learning Barriers/Preferences:  Learning Barriers/Preferences - 03/08/20 1520      Learning Barriers/Preferences   Learning Barriers None    Learning Preferences None           General Cardiac Education Topics:  AED/CPR: - Group verbal and written instruction with the use of models to demonstrate the basic use of the AED with the basic ABC's of resuscitation.   Anatomy and Cardiac Procedures: - Group verbal and visual presentation and models provide information about basic cardiac anatomy and function. Reviews the testing methods done to diagnose heart disease and the outcomes of the test results. Describes the treatment choices: Medical Management, Angioplasty,  or Coronary Bypass Surgery for treating various heart conditions including Myocardial Infarction, Angina, Valve Disease, and Cardiac Arrhythmias.  Written material given at graduation.   Medication Safety: - Group verbal and visual instruction to review commonly prescribed medications for heart and lung disease. Reviews the medication, class of the drug, and side effects. Includes the steps to properly store meds and maintain the prescription regimen.  Written material given at graduation.   Intimacy: - Group verbal instruction through game format to discuss how heart and lung disease can affect sexual intimacy. Written material given at graduation.. Flowsheet Row Cardiac Rehab from 05/29/2020 in [facility] Cardiac [facility] Rehab  Date 04/17/20  Educator AS  Instruction  Review Code 1- Verbalizes Understanding      Know Your Numbers and Heart Failure: - Group verbal and visual instruction to discuss disease risk factors for cardiac and pulmonary disease and treatment options.  Reviews associated critical values for Overweight/Obesity, Hypertension, Cholesterol, and Diabetes.  Discusses basics of heart failure: signs/symptoms and treatments.  Introduces Heart Failure Zone chart for action plan for heart failure.  Written material given at graduation.   Infection Prevention: - Provides verbal and written material to individual with discussion of infection control including proper hand washing and proper equipment cleaning during exercise session.   Falls Prevention: - Provides verbal and written material to individual with discussion of falls prevention and safety.   Other: -Provides group and verbal instruction on various topics (see comments)   Knowledge Questionnaire Score:  Knowledge Questionnaire Score - 03/18/20 1545      Knowledge Questionnaire Score   Pre Score 26/26           Core Components/Risk Factors/Patient Goals at Admission:  Personal Goals and Risk Factors at  Admission - 03/18/20 1542      Core Components/Risk Factors/Patient Goals on Admission    Weight Management Weight Maintenance    Diabetes Yes    Intervention Provide education about signs/symptoms and action to take for hypo/hyperglycemia.    Expected Outcomes Short Term: Participant verbalizes understanding of the signs/symptoms and immediate care of hyper/hypoglycemia, proper foot care and importance of medication, aerobic/resistive exercise and nutrition plan for blood glucose control.;Long Term: Attainment of HbA1C < 7%.    Hypertension Yes    Intervention Provide education on lifestyle modifcations including regular physical activity/exercise, weight management, moderate sodium restriction and increased consumption of fresh fruit, vegetables, and low fat dairy, alcohol moderation, and smoking cessation.;Monitor prescription use compliance.    Lipids Yes    Intervention Provide education and support for participant on nutrition & aerobic/resistive exercise along with prescribed medications to achieve LDL 70mg , HDL >40mg .    Expected Outcomes Short Term: Participant states understanding of desired cholesterol values and is compliant with medications prescribed. Participant is following exercise prescription and nutrition guidelines.;Long Term: Cholesterol controlled with medications as prescribed, with individualized exercise RX and with personalized nutrition plan. Value goals: LDL < 70mg , HDL > 40 mg.           Education:Diabetes - Individual verbal and written instruction to review signs/symptoms of diabetes, desired ranges of glucose level fasting, after meals and with exercise. Acknowledge that pre and post exercise glucose checks will be done for 3 sessions at entry of program. Flowsheet Row Cardiac Rehab from 05/29/2020 in [facility] Cardiac and Pulmonary Rehab  Date 03/08/20  Educator [facility]  Instruction Review Code 1- Verbalizes Understanding  [diet controlled]      Core Components/Risk  Factors/Patient Goals Review:   Goals and Risk Factor Review    Row Name 04/08/20 1551 05/13/20 1556 05/23/20 1551         Core Components/Risk Factors/Patient Goals Review   Personal Goals Review Hypertension;Lipids;Diabetes Hypertension;Lipids;Diabetes Hypertension;Diabetes;Weight Management/Obesity     Review Ronika reports taking all meds as directed.  She says her neck hurts when she tries to hold her head up.  Pt has forward head posture.  Staff suggested she ask Dr about PT.  She hasnt been checking BG at home.  She checks BP at home periodically. Edithe reports taking all meds as directed.  She says her neck hurts when she tries to hold her head up - she reports no difference.  Pt has forward head posture.  Staff suggested she ask Dr about PT - she has a physical coming up in March; encouraged to talk to doctor sooner.  She hasnt been checking BG at home.  She checks BP at home periodically. Yessica likes to maintain her weight under 135 lb and she feels good there.  She continues to struggle with neck fatigue and fatigue in general.  We talked about adding in exercise at home to help with fatigue and strength.  She saw Dr. Darrold Junker PA last week and a good report.  Her pressures have been doing well overall.  She has not been checking her sugars routinely.  She wants to try to get back to habit of checking more frequently.     Expected Outcomes Short: check with Dr about neck pain and how often to check BG Long:  manage risk factors Short: check with Dr about neck pain and how often to check BG, check BP more often when not at rehab Long:  manage risk factors Short: Get back into habit of check pressures and sugars.  Long; Continue to monitor risk factors.            Core Components/Risk Factors/Patient Goals at Discharge (Final Review):   Goals and Risk Factor Review - 05/23/20 1551      Core Components/Risk Factors/Patient Goals Review   Personal Goals Review Hypertension;Diabetes;Weight  Management/Obesity    Review Keyanna likes to maintain her weight under 135 lb and she feels good there.  She continues to struggle with neck fatigue and fatigue in general.  We talked about adding in exercise at home to help with fatigue and strength.  She saw Dr. Darrold Junker PA last week and a good report.  Her pressures have been doing well overall.  She has not been checking her sugars routinely.  She wants to try to get back to habit of checking more frequently.    Expected Outcomes Short: Get back into habit of check pressures and sugars.  Long; Continue to monitor risk factors.           ITP Comments:  ITP Comments    Row Name 03/08/20 1526 03/18/20 1553 03/20/20 1035 03/20/20 1551 04/17/20 0628   ITP Comments Initial telephone orientation completed. Diagnosis can be found in [facility] 8/22. EP orientation scheduled for Monday, 11/29 at 1pm. Completed and gym orientation. Initial ITP created and sent for review to Dr. Bethann Punches, Medical Director. 30 Day review completed. Medical Director ITP review done, changes made as directed, and signed approval by Medical Director.  New to program First full day of exercise!  Patient was oriented to gym and equipment including functions, settings, policies, and procedures.  Patient's individual exercise prescription and treatment plan were reviewed.  All starting workloads were established based on the results of the 6 minute walk test done at initial orientation visit.  The plan for exercise progression was also introduced and progression will be customized based on patient's performance and goals. 30 Day review completed. Medical Director ITP review done, changes made as directed, and signed approval by Medical Director.   Row Name 05/15/20 [id number] 06/12/20 0702         ITP Comments 30 Day review completed. Medical Director ITP review done, changes made as directed, and signed approval by Medical Director. 30 Day review completed. Medical Director ITP review  done, changes made as directed, and signed approval by Medical Director.  Comments:

## 2020-06-13 ENCOUNTER — Other Ambulatory Visit: Payer: Self-pay

## 2020-06-13 ENCOUNTER — Encounter: Payer: Medicare Other | Admitting: *Deleted

## 2020-06-13 VITALS — Ht 61.5 in | Wt 137.8 lb

## 2020-06-13 DIAGNOSIS — Z955 Presence of coronary angioplasty implant and graft: Secondary | ICD-10-CM

## 2020-06-13 DIAGNOSIS — I214 Non-ST elevation (NSTEMI) myocardial infarction: Secondary | ICD-10-CM

## 2020-06-13 DIAGNOSIS — Z5189 Encounter for other specified aftercare: Secondary | ICD-10-CM | POA: Diagnosis not present

## 2020-06-13 NOTE — Progress Notes (Signed)
Daily Session Note  Patient Details  Name: [name] MRN: [id number] Date of Birth: April 02, 1931 Referring Provider:   Flowsheet Row Cardiac Rehab from 03/18/2020 in [facility] Cardiac and Pulmonary Rehab  Referring Provider Paraschos      Encounter Date: 06/13/2020  Check In:  Session Check In - 06/13/20 [place]      Check-In   Supervising physician immediately available to respond to emergencies See telemetry face sheet for immediately available ER MD    Location [facility]-Cardiac & Pulmonary Rehab    Staff Present Renita Papa, RN BSN;Joseph [address] [place], [place], [place], CCRP, CCET    Virtual Visit No    Medication changes reported     No    Fall or balance concerns reported    No    Warm-up and Cool-down Performed on first and last piece of equipment    Resistance Training Performed Yes    VAD Patient? No    PAD/SET Patient? No      Pain Assessment   Currently in Pain? No/denies              Social History   Tobacco Use  Smoking Status Never Smoker  Smokeless Tobacco Never Used    Goals Met:  Independence with exercise equipment Exercise tolerated well No report of cardiac concerns or symptoms Strength training completed today  Goals Unmet:  Not Applicable  Comments: Pt able to follow exercise prescription today without complaint.  Will continue to monitor for progression.   [place] Name 03/18/20 1531 06/13/20 1545       6 Minute Walk   Phase Initial Discharge    Distance 765 feet 665 feet    Distance % Change -- -13.1 %    Distance Feet Change -- -100 ft    Walk Time 5.5 minutes 6 minutes    # of Rest Breaks 1 --    MPH 1.58 1.26    METS 1.07 0.74    RPE 13 11    Perceived Dyspnea  1 --    VO2 Peak 3.75 2.6    Symptoms Yes (comment) Yes (comment)    Comments leg fatigue pain in shoulders    Resting HR 64 bpm 82 bpm    Resting BP 138/50 130/58    Resting Oxygen Saturation  100 % --    Exercise Oxygen Saturation  during  6 min walk 99 % --    Max Ex. HR 91 bpm 80 bpm    Max Ex. BP 154/50 146/60    2 Minute Post BP 124/50 --            Dr. Emily Filbert is Medical Director for [place] and [facility].

## 2020-06-20 ENCOUNTER — Other Ambulatory Visit: Payer: Self-pay

## 2020-06-20 ENCOUNTER — Encounter: Payer: Medicare Other | Attending: Cardiology | Admitting: *Deleted

## 2020-06-20 DIAGNOSIS — Z955 Presence of coronary angioplasty implant and graft: Secondary | ICD-10-CM | POA: Diagnosis not present

## 2020-06-20 DIAGNOSIS — I252 Old myocardial infarction: Secondary | ICD-10-CM | POA: Insufficient documentation

## 2020-06-20 DIAGNOSIS — Z5189 Encounter for other specified aftercare: Secondary | ICD-10-CM | POA: Diagnosis not present

## 2020-06-20 DIAGNOSIS — I214 Non-ST elevation (NSTEMI) myocardial infarction: Secondary | ICD-10-CM

## 2020-06-20 NOTE — Progress Notes (Signed)
Daily Session Note  Patient Details  Name: PATRICIA FARGO MRN: [id number] Date of Birth: [birth year] Referring Provider:   Flowsheet Row Cardiac Rehab from 03/18/2020 in [facility] Cardiac and Pulmonary Rehab  Referring Provider Paraschos      Encounter Date: 06/20/2020  Check In:  Session Check In - 06/20/20 1542      Check-In   Supervising physician immediately available to respond to emergencies See telemetry face sheet for immediately available ER MD    Location [facility]-Cardiac & Pulmonary Rehab    Staff Present Renita Papa, RN BSN;Joseph [address] [place], [place], [place], CCRP, CCET    Virtual Visit No    Medication changes reported     No    Fall or balance concerns reported    No    Warm-up and Cool-down Performed on first and last piece of equipment    Resistance Training Performed Yes    VAD Patient? No    PAD/SET Patient? No      Pain Assessment   Currently in Pain? No/denies              Social History   Tobacco Use  Smoking Status Never Smoker  Smokeless Tobacco Never Used    Goals Met:  Independence with exercise equipment Exercise tolerated well Personal goals reviewed No report of cardiac concerns or symptoms Strength training completed today  Goals Unmet:  Not Applicable  Comments: Pt able to follow exercise prescription today without complaint.  Will continue to monitor for progression.    Dr. Emily Filbert is Medical Director for [place] and [facility].

## 2020-06-24 ENCOUNTER — Other Ambulatory Visit: Payer: Self-pay

## 2020-06-24 DIAGNOSIS — Z955 Presence of coronary angioplasty implant and graft: Secondary | ICD-10-CM

## 2020-06-24 DIAGNOSIS — I214 Non-ST elevation (NSTEMI) myocardial infarction: Secondary | ICD-10-CM

## 2020-06-24 DIAGNOSIS — Z5189 Encounter for other specified aftercare: Secondary | ICD-10-CM | POA: Diagnosis not present

## 2020-06-24 NOTE — Progress Notes (Signed)
Daily Session Note  Patient Details  Name: [name] MRN: [id number] Date of Birth: 1930/10/07 Referring Provider:   Flowsheet Row Cardiac Rehab from 03/18/2020 in [facility] Cardiac and Pulmonary Rehab  Referring Provider Paraschos      Encounter Date: 06/24/2020  Check In:  Session Check In - 06/24/20 1603      Check-In   Supervising physician immediately available to respond to emergencies See telemetry face sheet for immediately available ER MD    Location [facility]-Cardiac & Pulmonary Rehab    Staff Present Birdie Sons, MPA, Mauricia Area, BS, ACSM CEP, Exercise Physiologist    Virtual Visit No    Medication changes reported     No    Fall or balance concerns reported    No    Warm-up and Cool-down Performed on first and last piece of equipment    Resistance Training Performed Yes    VAD Patient? No    PAD/SET Patient? No      Pain Assessment   Currently in Pain? No/denies              Social History   Tobacco Use  Smoking Status Never Smoker  Smokeless Tobacco Never Used    Goals Met:  Independence with exercise equipment Exercise tolerated well No report of cardiac concerns or symptoms Strength training completed today  Goals Unmet:  Not Applicable  Comments: Pt able to follow exercise prescription today without complaint.  Will continue to monitor for progression.    Dr. Emily Filbert is Medical Director for [place] and [facility].

## 2020-06-26 ENCOUNTER — Other Ambulatory Visit: Payer: Self-pay

## 2020-06-26 DIAGNOSIS — Z955 Presence of coronary angioplasty implant and graft: Secondary | ICD-10-CM

## 2020-06-26 DIAGNOSIS — I214 Non-ST elevation (NSTEMI) myocardial infarction: Secondary | ICD-10-CM

## 2020-06-26 DIAGNOSIS — Z5189 Encounter for other specified aftercare: Secondary | ICD-10-CM | POA: Diagnosis not present

## 2020-06-26 NOTE — Progress Notes (Signed)
Daily Session Note  Patient Details  Name: [name] MRN: [id number] Date of Birth: 1930/12/23 Referring Provider:   Flowsheet Row Cardiac Rehab from 03/18/2020 in [facility] Cardiac and Pulmonary Rehab  Referring Provider Paraschos      Encounter Date: 06/26/2020  Check In:  Session Check In - 06/26/20 1539      Check-In   Supervising physician immediately available to respond to emergencies See telemetry face sheet for immediately available ER MD    Location [facility]-Cardiac & Pulmonary Rehab    Staff Present Birdie Sons, MPA, Nino Glow, MS Exercise Physiologist    Virtual Visit No    Medication changes reported     No    Fall or balance concerns reported    No    Warm-up and Cool-down Performed on first and last piece of equipment    Resistance Training Performed Yes    VAD Patient? No    PAD/SET Patient? No      Pain Assessment   Currently in Pain? No/denies              Social History   Tobacco Use  Smoking Status Never Smoker  Smokeless Tobacco Never Used    Goals Met:  Independence with exercise equipment Exercise tolerated well No report of cardiac concerns or symptoms Strength training completed today  Goals Unmet:  Not Applicable  Comments: Pt able to follow exercise prescription today without complaint.  Will continue to monitor for progression.    Dr. Emily Filbert is Medical Director for [place] and [facility].

## 2020-06-27 ENCOUNTER — Encounter: Payer: Medicare Other | Admitting: *Deleted

## 2020-06-27 ENCOUNTER — Other Ambulatory Visit: Payer: Self-pay

## 2020-06-27 DIAGNOSIS — I214 Non-ST elevation (NSTEMI) myocardial infarction: Secondary | ICD-10-CM

## 2020-06-27 DIAGNOSIS — Z955 Presence of coronary angioplasty implant and graft: Secondary | ICD-10-CM

## 2020-06-27 DIAGNOSIS — Z5189 Encounter for other specified aftercare: Secondary | ICD-10-CM | POA: Diagnosis not present

## 2020-06-27 NOTE — Patient Instructions (Signed)
Discharge Patient Instructions  Patient Details  Name: [name] MRN: [id number] Date of Birth: Jan 28, 1931 Referring Provider:  Isaias Cowman, MD   Number of Visits: 20  Reason for Discharge:  Patient reached a stable level of exercise. Patient independent in their exercise. Patient has met program and personal goals.  Smoking History:  Social History   Tobacco Use  Smoking Status Never Smoker  Smokeless Tobacco Never Used    Diagnosis:  Status post coronary artery stent placement  NSTEMI (non-ST elevated myocardial infarction) [facility])  Initial Exercise Prescription:  Initial Exercise Prescription - 03/18/20 1500      Date of Initial Exercise RX and Referring Provider   Date 03/18/20    Referring Provider Paraschos      Treadmill   MPH 1    Grade 0    Minutes 15    METs 1.77      Recumbant Bike   Level 1    RPM 60    Minutes 15    METs 1      NuStep   Level 1    SPM 80    Minutes 15    METs 1      REL-XR   Level 1    Speed 50    Minutes 15    METs 1      Biostep-RELP   Level 1    SPM 50    Minutes 15    METs 1      Prescription Details   Frequency (times per week) 3    Duration Progress to 30 minutes of continuous aerobic without signs/symptoms of physical distress      Intensity   THRR 40-80% of Max Heartrate 92-119    Ratings of Perceived Exertion 11-15    Perceived Dyspnea 0-4      Resistance Training   Training Prescription Yes    Weight 3 lb    Reps 10-15           Discharge Exercise Prescription (Final Exercise Prescription Changes):  Exercise Prescription Changes - 06/26/20 0900      Response to Exercise   Blood Pressure (Admit) 124/82    Blood Pressure (Exercise) 138/84    Blood Pressure (Exit) 120/60    Heart Rate (Admit) 63 bpm    Heart Rate (Exercise) 18 bpm    Heart Rate (Exit) 63 bpm    Rating of Perceived Exertion (Exercise) 14    Symptoms none    Duration Continue with 30 min of aerobic exercise  without signs/symptoms of physical distress.    Intensity THRR unchanged      Progression   Progression Continue to progress workloads to maintain intensity without signs/symptoms of physical distress.    Average METs 1.8      Resistance Training   Training Prescription Yes    Weight 3 lb    Reps 10-15      Interval Training   Interval Training No      NuStep   Level 1    Minutes 15    METs 1.6      Biostep-RELP   Level 1    Minutes 15    METs 2      Home Exercise Plan   Plans to continue exercise at Home (comment)   Chair exercise videos   Frequency Add 1 additional day to program exercise sessions.    Initial Home Exercises Provided 06/12/20           Functional Capacity:  [place] Name 03/18/20 1531 06/13/20 1545       6 Minute Walk   Phase Initial Discharge    Distance 765 feet 665 feet    Distance % Change -- -13.1 %    Distance Feet Change -- -100 ft    Walk Time 5.5 minutes 6 minutes    # of Rest Breaks 1 --    MPH 1.58 1.26    METS 1.07 0.74    RPE 13 11    Perceived Dyspnea  1 --    VO2 Peak 3.75 2.6    Symptoms Yes (comment) Yes (comment)    Comments leg fatigue pain in shoulders    Resting HR 64 bpm 82 bpm    Resting BP 138/50 130/58    Resting Oxygen Saturation  100 % --    Exercise Oxygen Saturation  during 6 min walk 99 % --    Max Ex. HR 91 bpm 80 bpm    Max Ex. BP 154/50 146/60    2 Minute Post BP 124/50 --           Quality of Life:  Quality of Life - 06/20/20 1603      Quality of Life   Select Quality of Life      Quality of Life Scores   Health/Function Pre 26.4 %    Health/Function Post 26 %    Health/Function % Change -1.52 %    Socioeconomic Pre 26 %    Socioeconomic Post 24.42 %    Socioeconomic % Change  -6.08 %    Psych/Spiritual Pre 26.57 %    Psych/Spiritual Post 27.21 %    Psych/Spiritual % Change 2.41 %    Family Pre 28.5 %    Family Post 30 %    Family % Change 5.26 %    GLOBAL Pre 26.66 %     GLOBAL Post 26.25 %    GLOBAL % Change -1.54 %           Personal Goals: Goals established at orientation with interventions provided to work toward goal.  Personal Goals and Risk Factors at Admission - 03/18/20 1542      Core Components/Risk Factors/Patient Goals on Admission    Weight Management Weight Maintenance    Diabetes Yes    Intervention Provide education about signs/symptoms and action to take for hypo/hyperglycemia.    Expected Outcomes Short Term: Participant verbalizes understanding of the signs/symptoms and immediate care of hyper/hypoglycemia, proper foot care and importance of medication, aerobic/resistive exercise and nutrition plan for blood glucose control.;Long Term: Attainment of HbA1C < 7%.    Hypertension Yes    Intervention Provide education on lifestyle modifcations including regular physical activity/exercise, weight management, moderate sodium restriction and increased consumption of fresh fruit, vegetables, and low fat dairy, alcohol moderation, and smoking cessation.;Monitor prescription use compliance.    Lipids Yes    Intervention Provide education and support for participant on nutrition & aerobic/resistive exercise along with prescribed medications to achieve LDL <80m, HDL >474m    Expected Outcomes Short Term: Participant states understanding of desired cholesterol values and is compliant with medications prescribed. Participant is following exercise prescription and nutrition guidelines.;Long Term: Cholesterol controlled with medications as prescribed, with individualized exercise RX and with personalized nutrition plan. Value goals: LDL < 7046mHDL > 40 mg.            Personal Goals Discharge:  Goals and Risk Factor Review - 06/20/20 1610  Core Components/Risk Factors/Patient Goals Review   Personal Goals Review Weight Management/Obesity;Hypertension;Diabetes    Review Rheda's blood pressure and diabetes continues to stay stable. She is  still working on checking her sugars routinely, but is feeling well. Her weight is stable between 135-139 so she is happy with that. Her neck fatigue is still present and she is planning on talking with her PCP at her next appt (next month) about getting referred to PT.    Expected Outcomes Short: talk with MD about referral for PT so she can continue to exercise; check sugars routinely. Long: maintain independence in managing risk factors.           Exercise Goals and Review:  Exercise Goals    Row Name 03/18/20 1537             Exercise Goals   Increase Physical Activity Yes       Intervention Provide advice, education, support and counseling about physical activity/exercise needs.;Develop an individualized exercise prescription for aerobic and resistive training based on initial evaluation findings, risk stratification, comorbidities and participant's personal goals.       Expected Outcomes Short Term: Attend rehab on a regular basis to increase amount of physical activity.;Long Term: Add in home exercise to make exercise part of routine and to increase amount of physical activity.;Long Term: Exercising regularly at least 3-5 days a week.       Increase Strength and Stamina Yes       Intervention Provide advice, education, support and counseling about physical activity/exercise needs.;Develop an individualized exercise prescription for aerobic and resistive training based on initial evaluation findings, risk stratification, comorbidities and participant's personal goals.       Expected Outcomes Short Term: Increase workloads from initial exercise prescription for resistance, speed, and METs.;Short Term: Perform resistance training exercises routinely during rehab and add in resistance training at home;Long Term: Improve cardiorespiratory fitness, muscular endurance and strength as measured by increased METs and functional capacity (6MWT)       Able to understand and use rate of perceived  exertion (RPE) scale Yes       Intervention Provide education and explanation on how to use RPE scale       Expected Outcomes Short Term: Able to use RPE daily in rehab to express subjective intensity level;Long Term:  Able to use RPE to guide intensity level when exercising independently       Able to understand and use Dyspnea scale Yes       Intervention Provide education and explanation on how to use Dyspnea scale       Expected Outcomes Short Term: Able to use Dyspnea scale daily in rehab to express subjective sense of shortness of breath during exertion;Long Term: Able to use Dyspnea scale to guide intensity level when exercising independently       Knowledge and understanding of Target Heart Rate Range (THRR) Yes       Intervention Provide education and explanation of THRR including how the numbers were predicted and where they are located for reference       Expected Outcomes Short Term: Able to state/look up THRR;Short Term: Able to use daily as guideline for intensity in rehab;Long Term: Able to use THRR to govern intensity when exercising independently       Able to check pulse independently Yes       Intervention Provide education and demonstration on how to check pulse in carotid and radial arteries.;Review the importance of being able to  check your own pulse for safety during independent exercise       Expected Outcomes Short Term: Able to explain why pulse checking is important during independent exercise;Long Term: Able to check pulse independently and accurately       Understanding of Exercise Prescription Yes       Intervention Provide education, explanation, and written materials on patient's individual exercise prescription       Expected Outcomes Short Term: Able to explain program exercise prescription;Long Term: Able to explain home exercise prescription to exercise independently              Exercise Goals Re-Evaluation:  Exercise Goals Re-Evaluation    Row Name 03/20/20  1552 04/03/20 1347 04/08/20 1554 04/17/20 0818 04/29/20 1143     Exercise Goal Re-Evaluation   Exercise Goals Review Increase Physical Activity;Able to understand and use rate of perceived exertion (RPE) scale;Knowledge and understanding of Target Heart Rate Range (THRR);Understanding of Exercise Prescription;Able to understand and use Dyspnea scale;Increase Strength and Stamina;Able to check pulse independently Increase Physical Activity;Increase Strength and Stamina;Understanding of Exercise Prescription -- Increase Physical Activity;Increase Strength and Stamina Increase Physical Activity;Increase Strength and Stamina;Understanding of Exercise Prescription   Comments Reviewed RPE and dyspnea scales, THR and program prescription with pt today.  Pt voiced understanding and was given a copy of goals to take home. Lameka is off to a good start in rehab.  She is getting her full 30 min of exericse now.  We will continue to montior her progress. Xitlally reports some mid to low back pain on machines.  A pillow behind back seems to help. Fraya has attended consistently this month.  She has reached her THR range on machines.  Staff will monitor progress. Lorette has been doing well in rehab.  She is up to 2 min on the treadmill!  We will continue to monitor her progress.   Expected Outcomes Short: Use RPE daily to regulate intensity. Long: Follow program prescription in THR. Short: Continue to attend regularly Long: Continue to follow progream prescription Short:  try machines without arms Long: build overall stamina Short: continue to attend consistently Long:  build stamina Short: Increase level on NuStep Long: Continue to improve stamina   Row Name 05/13/20 1548 05/13/20 1641 05/23/20 1541 06/11/20 1238 06/12/20 1559     Exercise Goal Re-Evaluation   Exercise Goals Review -- Increase Physical Activity;Increase Strength and Stamina Increase Physical Activity;Increase Strength and Stamina;Understanding of  Exercise Prescription Increase Physical Activity;Increase Strength and Stamina Increase Physical Activity;Able to understand and use rate of perceived exertion (RPE) scale;Knowledge and understanding of Target Heart Rate Range (THRR);Understanding of Exercise Prescription;Increase Strength and Stamina;Able to understand and use Dyspnea scale;Able to check pulse independently   Comments -- Reviewed home exercise with pt today.  Pt plans to use staff videos and consider [employer] for exercise.  Reviewed THR, pulse, RPE, sign and symptoms, pulse oximetery and when to call 911 or MD.  Also discussed weather considerations and indoor options.  Pt voiced understanding. Areeba is doing well in rehab.  She says that she cannot really tell much difference yet. She has not started home exercise yet.  We talked about using the staff videos and her son was also present to listen in to help her at home. Donta uses the T4 nustep every session.  She has increased her level to 3.  She is doing as well as can be expected. Reviewed home exercise with pt today.  Pt plans to walk, youtube  videos, resistance bands for exercise.  Reviewed THR, pulse, RPE, sign and symptoms, pulse oximetery and when to call 911 or MD.  Also discussed weather considerations and indoor options.  Pt voiced understanding.   Expected Outcomes -- Short: begin exercising one day per week outside program sessions Short: Start to add in staff video exercises at home.  Long: Continue to improve stamina. Short: try strength videos at home Long: build overall stamina Short start to add in chair exercise videos at home  Long exercise independently   [place] Name 06/20/20 1608 06/26/20 0908           Exercise Goal Re-Evaluation   Exercise Goals Review Increase Physical Activity;Able to understand and use rate of perceived exertion (RPE) scale;Knowledge and understanding of Target Heart Rate Range (THRR);Understanding of Exercise Prescription;Increase Strength and  Stamina;Able to understand and use Dyspnea scale;Able to check pulse independently Increase Physical Activity;Increase Strength and Stamina;Understanding of Exercise Prescription      Comments Andraya is planning on using bands that she bought and hand weights in addition to walking as her plan to continue to exercise after graduation. She was given a packet of how to use the bands and feels comfortable using them on her own. Karnisha is nearing graduation.  She did not improve her distance on her post walk, but her RPE was lower.  She feels that she has more energy and plans to start PT for her neck after rehab.      Expected Outcomes Short: add a day of walking with bands/weights. Long: maintain independence with exercise. Short: Graduate rehab Long: Continue to exercise independently             Nutrition & Weight - Outcomes:  Pre Biometrics - 03/18/20 1540      Pre Biometrics   Height 5' 1.5" (1.562 m)    Weight 139 lb 12.8 oz (63.4 kg)    BMI (Calculated) 25.99           Post Biometrics - 06/13/20 1547       Post  Biometrics   Height 5' 1.5" (1.562 m)    Weight 137 lb 12.8 oz (62.5 kg)    BMI (Calculated) 25.62           Nutrition:  Nutrition Therapy & Goals - 06/03/20 1544      Nutrition Therapy   Diet Heart healthy, low Na    Drug/Food Interactions Statins/Certain Fruits    Protein (specify units) 50g    Fiber 25 grams    Whole Grain Foods 3 servings    Saturated Fats 12 max. grams    Fruits and Vegetables 8 servings/day    Sodium 1.5 grams      Personal Nutrition Goals   Nutrition Goal ST:LT: Lyndsee would not like to make any changes at this time    Comments B: cereal (cinnamon toast crunch, cherrios - skim milk or 1% milk), toast (white) - PB and J, coffee (creamer), orange juice S: candy - sugarfree D: go out - hardies, wendys, cafeteria, cracker barrell Drinks: water Her sister is very picky who she lives with. She will cook a meal on sunday for dinner like  meatloaf or fish. She doesn't eat much chicken. 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 Discussed heart healthy eating. Her son goes food shopping for her. Discussed heart healthy eating and small modifications such as choosing more vegetables and less red meat  when going out. Will continue to check in - she does not want to make any chnages at this time.      Intervention Plan   Intervention Prescribe, educate and counsel regarding individualized specific dietary modifications aiming towards targeted core components such as weight, hypertension, lipid management, diabetes, heart failure and other comorbidities.;Nutrition handout(s) given to patient.    Expected Outcomes Short Term Goal: Understand basic principles of dietary content, such as calories, fat, sodium, cholesterol and nutrients.;Short Term Goal: A plan has been developed with personal nutrition goals set during dietitian appointment.;Long Term Goal: Adherence to prescribed nutrition plan.           Nutrition Discharge:   Education Questionnaire Score:  Knowledge Questionnaire Score - 06/20/20 1603      Knowledge Questionnaire Score   Post Score 24/26           Goals reviewed with patient; copy given to patient.

## 2020-06-27 NOTE — Progress Notes (Signed)
Daily Session Note  Patient Details  Name: [name] MRN: [id number] Date of Birth: 1930-09-25 Referring Provider:   Flowsheet Row Cardiac Rehab from 03/18/2020 in [facility]  Referring Provider Paraschos      Encounter Date: 06/27/2020  Check In:  Session Check In - 06/27/20 1554      Check-In   Supervising physician immediately available to respond to emergencies See telemetry face sheet for immediately available ER MD    Location [facility]-Cardiac & Pulmonary Rehab    Staff Present Renita Papa, RN BSN;Joseph [address] [place], [place], [place], CCRP, CCET    Virtual Visit No    Medication changes reported     No    Fall or balance concerns reported    No    Warm-up and Cool-down Performed on first and last piece of equipment    Resistance Training Performed Yes    VAD Patient? No    PAD/SET Patient? No      Pain Assessment   Currently in Pain? No/denies              Social History   Tobacco Use  Smoking Status Never Smoker  Smokeless Tobacco Never Used    Goals Met:  Independence with exercise equipment Exercise tolerated well No report of cardiac concerns or symptoms Strength training completed today  Goals Unmet:  Not Applicable  Comments: Pt able to follow exercise prescription today without complaint.  Will continue to monitor for progression.    Dr. Emily Filbert is Medical Director for [facility] and [facility].

## 2020-07-01 ENCOUNTER — Other Ambulatory Visit: Payer: Self-pay

## 2020-07-01 DIAGNOSIS — Z955 Presence of coronary angioplasty implant and graft: Secondary | ICD-10-CM

## 2020-07-01 DIAGNOSIS — Z5189 Encounter for other specified aftercare: Secondary | ICD-10-CM | POA: Diagnosis not present

## 2020-07-01 DIAGNOSIS — I214 Non-ST elevation (NSTEMI) myocardial infarction: Secondary | ICD-10-CM

## 2020-07-01 NOTE — Progress Notes (Signed)
Daily Session Note  Patient Details  Name: [name] MRN: [id number] Date of Birth: 04/20/1931 Referring Provider:   Flowsheet Row Cardiac Rehab from 03/18/2020 in [facility] Cardiac [facility]monary Re[facility]  Referring Provider Paraschos      Encounter Date: 07/01/2020  Check In:  Session Check In - 07/01/20 1556      Check-In   Supervising physician immediately available to respond to emergencies See telemetry face sheet for immediately available ER MD    Location [facility]Cardiac & Pulmonary Rehab    Staff Present Birdie Sons, MPA, Mauricia Area, BS, ACSM CEP, Exercise Physiologist;Kara Eliezer Bottom, MS Exercise Physiologist    Virtual Visit No    Medication changes reported     No    Fall or balance concerns reported    No    Warm-up and Cool-down Performed on first and last piece of equipment    Resistance Training Performed Yes    VAD Patient? No    PAD/SET Patient? No      Pain Assessment   Currently in Pain? No/denies              Social History   Tobacco Use  Smoking Status Never Smoker  Smokeless Tobacco Never Used    Goals Met:  Independence with exercise equipment Exercise tolerated well No report of cardiac concerns or symptoms Strength training completed today  Goals Unmet:  Not Applicable  Comments:  [name] graduated today from  rehab with 36 sessions completed.  Details of the patient's exercise prescription and what She needs to do in order to continue the prescription and progress were discussed with patient.  Patient was given a copy of prescription and goals.  Patient verbalized understanding.  [name] plans to continue to exercise by investigating the [facility] to continue exercising.     Dr. Emily Filbert is Medical Director for [place] [facility].

## 2020-07-01 NOTE — Progress Notes (Signed)
Cardiac Individual Treatment Plan  Patient Details  Name: [name] MRN: [id number] Date of Birth: Nov 26, 1930 Referring Provider:   Flowsheet Row Cardiac Rehab from 03/18/2020 in [facility] Cardiac and Pulmonary Rehab  Referring Provider Paraschos      Initial Encounter Date:  Flowsheet Row Cardiac Rehab from 03/18/2020 in [facility] Cardiac and Pulmonary Rehab  Date 03/18/20      Visit Diagnosis: Status post coronary artery stent placement  NSTEMI (non-ST elevated myocardial infarction) [facility])  Patient's Home Medications on Admission:  Current Outpatient Medications:  .  acetaminophen (TYLENOL) 500 MG tablet, Take 500 mg by mouth every 6 (six) hours as needed., Disp: , Rfl:  .  amLODipine (NORVASC) 5 MG tablet, Take 5 mg by mouth daily., Disp: , Rfl:  .  aspirin EC 81 MG tablet, Take 81 mg by mouth daily., Disp: , Rfl:  .  cloNIDine (CATAPRES - DOSED IN MG/24 HR) 0.1 mg/24hr patch, 0.1 mg once a week., Disp: , Rfl:  .  clopidogrel (PLAVIX) 75 MG tablet, Take 75 mg by mouth daily., Disp: , Rfl:  .  Dextromethorphan-guaiFENesin (MUCINEX DM PO), Take by mouth., Disp: , Rfl:  .  donepezil (ARICEPT) 5 MG tablet, Take 5 mg by mouth daily., Disp: , Rfl:  .  fluticasone (FLONASE) 50 MCG/ACT nasal spray, Place into both nostrils daily., Disp: , Rfl:  .  gabapentin (NEURONTIN) 100 MG capsule, Take 100 mg by mouth 3 (three) times daily. , Disp: , Rfl:  .  glimepiride (AMARYL) 1 MG tablet, Take 1 mg by mouth daily with breakfast. (Patient not taking: Reported on 03/08/2020), Disp: , Rfl:  .  isosorbide mononitrate (IMDUR) 60 MG 24 hr tablet, Take 60 mg by mouth daily., Disp: , Rfl:  .  magnesium oxide (MAG-OX) 400 MG tablet, Take 400 mg by mouth daily. , Disp: , Rfl:  .  metoprolol tartrate (LOPRESSOR) 25 MG tablet, Take 25 mg by mouth 2 (two) times daily. (Patient not taking: Reported on 03/08/2020), Disp: , Rfl:  .  Multiple Vitamins-Minerals (MULTIVITAMIN GUMMIES ADULT PO), Take by mouth., Disp: ,  Rfl:  .  nitroGLYCERIN (NITROSTAT) 0.4 MG SL tablet, Place 0.4 mg under the tongue every 5 (five) minutes as needed. For chest pain, Disp: , Rfl:  .  omeprazole (PRILOSEC) 20 MG capsule, Take 20 mg by mouth daily. , Disp: , Rfl:  .  polyethylene glycol (MIRALAX / GLYCOLAX) 17 g packet, Take 17 g by mouth daily as needed. (Patient not taking: Reported on 03/08/2020), Disp: , Rfl:  .  rosuvastatin (CRESTOR) 20 MG tablet, Take 20 mg by mouth daily., Disp: , Rfl:  .  spironolactone (ALDACTONE) 25 MG tablet, Take 25 mg by mouth daily., Disp: , Rfl:  .  timolol (TIMOPTIC) 0.5 % ophthalmic solution, Place 1 drop into both eyes at bedtime., Disp: , Rfl:  .  tiZANidine (ZANAFLEX) 2 MG tablet, Take 2 mg by mouth 3 (three) times daily.  (Patient not taking: Reported on 03/08/2020), Disp: , Rfl:  .  torsemide (DEMADEX) 20 MG tablet, Take 30 mg by mouth every evening. , Disp: , Rfl:   Past Medical History: Past Medical History:  Diagnosis Date  . Hypercholesteremia   . Hypertension   . Myocardial infarction (HCC) 2008  . S/P angioplasty with stent     Tobacco Use: Social History   Tobacco Use  Smoking Status Never Smoker  Smokeless Tobacco Never Used    Labs: Recent Review Contractor for  [facility] Cardiac and Pulmonary Rehab Latest Ref Rng & Units 12/08/2011 08/15/2012 11/18/2019   Cholestrol 0 - 200 mg/dL - 540 981   LDLCALC 0 - 99 mg/dL - 81 191(Y)   HDL >78 mg/dL - 29(F) 52   Trlycerides <150 mg/dL - 621(H) 92   Hemoglobin A1c 4.8 - 5.6 % - 7.5(H) 6.5(H)   TCO2 0 - 100 mmol/L 26 - -       Exercise Target Goals: Exercise Program Goal: Individual exercise prescription set using results from initial 6 min walk test and THRR while considering  patient's activity barriers and safety.   Exercise Prescription Goal: Initial exercise prescription builds to 30-45 minutes a day of aerobic activity, 2-3 days per week.  Home exercise guidelines will be given to patient during program as part  of exercise prescription that the participant will acknowledge.   Education: Aerobic Exercise: - Group verbal and visual presentation on the components of exercise prescription. Introduces F.I.T.T principle from ACSM for exercise prescriptions.  Reviews F.I.T.T. principles of aerobic exercise including progression. Written material given at graduation. Flowsheet Row Cardiac Rehab from 06/12/2020 in [facility] Cardiac and Pulmonary Rehab  Date 04/17/20  Educator AS  Instruction Review Code 1- Verbalizes Understanding      Education: Resistance Exercise: - Group verbal and visual presentation on the components of exercise prescription. Introduces F.I.T.T principle from ACSM for exercise prescriptions  Reviews F.I.T.T. principles of resistance exercise including progression. Written material given at graduation. Flowsheet Row Cardiac Rehab from 06/12/2020 in [facility] Cardiac and Pulmonary Rehab  Date 06/12/20  Educator AS  Instruction Review Code 1- Verbalizes Understanding       Education: Exercise & Equipment Safety: - Individual verbal instruction and demonstration of equipment use and safety with use of the equipment.   Education: Exercise Physiology & General Exercise Guidelines: - Group verbal and written instruction with models to review the exercise physiology of the cardiovascular system and associated critical values. Provides general exercise guidelines with specific guidelines to those with heart or lung disease.  Flowsheet Row Cardiac Rehab from 06/12/2020 in [facility] Cardiac and Pulmonary Rehab  Date 05/29/20  Educator AS  Instruction Review Code 1- Verbalizes Understanding      Education: Flexibility, Balance, Mind/Body Relaxation: - Group verbal and visual presentation with interactive activity on the components of exercise prescription. Introduces F.I.T.T principle from ACSM for exercise prescriptions. Reviews F.I.T.T. principles of flexibility and balance exercise training including  progression. Also discusses the mind body connection.  Reviews various relaxation techniques to help reduce and manage stress (i.e. Deep breathing, progressive muscle relaxation, and visualization). Balance handout provided to take home. Written material given at graduation. Flowsheet Row Cardiac Rehab from 06/12/2020 in [facility] Cardiac and Pulmonary Rehab  Date 04/24/20  Educator AS  Instruction Review Code 1- Verbalizes Understanding      Activity Barriers & Risk Stratification:  Activity Barriers & Cardiac Risk Stratification - 03/08/20 1518      Activity Barriers & Cardiac Risk Stratification   Activity Barriers Assistive Device;Back Problems;Neck/Spine Problems;Muscular Weakness    Cardiac Risk Stratification High           6 Minute Walk:  6 Minute Walk    Row Name 03/18/20 1531 06/13/20 1545       6 Minute Walk   Phase Initial Discharge    Distance 765 feet 665 feet    Distance % Change -- -13.1 %    Distance Feet Change -- -100 ft    Walk Time 5.5  minutes 6 minutes    # of Rest Breaks 1 --    MPH 1.58 1.26    METS 1.07 0.74    RPE 13 11    Perceived Dyspnea  1 --    VO2 Peak 3.75 2.6    Symptoms Yes (comment) Yes (comment)    Comments leg fatigue pain in shoulders    Resting HR 64 bpm 82 bpm    Resting BP 138/50 130/58    Resting Oxygen Saturation  100 % --    Exercise Oxygen Saturation  during 6 min walk 99 % --    Max Ex. HR 91 bpm 80 bpm    Max Ex. BP 154/50 146/60    2 Minute Post BP 124/50 --           Oxygen Initial Assessment:   Oxygen Re-Evaluation:   Oxygen Discharge (Final Oxygen Re-Evaluation):   Initial Exercise Prescription:  Initial Exercise Prescription - 03/18/20 1500      Date of Initial Exercise RX and Referring Provider   Date 03/18/20    Referring Provider Paraschos      Treadmill   MPH 1    Grade 0    Minutes 15    METs 1.77      Recumbant Bike   Level 1    RPM 60    Minutes 15    METs 1      NuStep   Level 1     SPM 80    Minutes 15    METs 1      REL-XR   Level 1    Speed 50    Minutes 15    METs 1      Biostep-RELP   Level 1    SPM 50    Minutes 15    METs 1      Prescription Details   Frequency (times per week) 3    Duration Progress to 30 minutes of continuous aerobic without signs/symptoms of physical distress      Intensity   THRR 40-80% of Max Heartrate 92-119    Ratings of Perceived Exertion 11-15    Perceived Dyspnea 0-4      Resistance Training   Training Prescription Yes    Weight 3 lb    Reps 10-15           Perform Capillary Blood Glucose checks as needed.  Exercise Prescription Changes:  Exercise Prescription Changes    Row Name 03/18/20 1500 04/03/20 1400 04/17/20 0800 04/29/20 1100 05/13/20 1600     Response to Exercise   Blood Pressure (Admit) 138/50 156/70 142/68 128/58 --   Blood Pressure (Exercise) 154/50 142/62 180/64 130/60 --   Blood Pressure (Exit) 124/50 150/68 132/68 126/58 --   Heart Rate (Admit) 64 bpm 83 bpm 78 bpm 81 bpm --   Heart Rate (Exercise) 91 bpm 87 bpm 101 bpm 77 bpm --   Heart Rate (Exit) 64 bpm 71 bpm 81 bpm 62 bpm --   Oxygen Saturation (Admit) 100 % -- -- -- --   Oxygen Saturation (Exercise) 99 % -- -- -- --   Rating of Perceived Exertion (Exercise) 13 11 13 11  --   Perceived Dyspnea (Exercise) 1 -- -- -- --   Symptoms leg fatigue fatigue -- hips hurting on treadmill --   Duration -- Continue with 30 min of aerobic exercise without signs/symptoms of physical distress. Continue with 30 min of aerobic exercise without signs/symptoms of physical distress. Continue  with 30 min of aerobic exercise without signs/symptoms of physical distress. --   Intensity -- THRR unchanged THRR unchanged THRR unchanged --     Progression   Progression -- Continue to progress workloads to maintain intensity without signs/symptoms of physical distress. Continue to progress workloads to maintain intensity without signs/symptoms of physical distress.  Continue to progress workloads to maintain intensity without signs/symptoms of physical distress. --   Average METs -- 1.7 2.1 1.66 --     Resistance Training   Training Prescription -- Yes Yes Yes --   Weight -- 3 lb 2 lb 2 lb --   Reps -- 10-15 10-15 10-15 --     Interval Training   Interval Training -- No No No --     Treadmill   MPH -- -- -- 1 --   Grade -- -- -- 0 --   Minutes -- -- -- 2 --   METs -- -- -- 1.77 --     NuStep   Level -- 1 1 1  --   SPM -- -- 80 -- --   Minutes -- 15  has gone up to 30 min 15 15 --   METs -- 1.4 2.1 1.5 --     T5 Nustep   Level -- -- -- 1 --   Minutes -- -- -- 15 --   METs -- -- -- 1.7 --     Biostep-RELP   Level -- 1 -- -- --   Minutes -- 15 -- -- --   METs -- 2 -- -- --     Home Exercise Plan   Plans to continue exercise at -- -- -- -- Home (comment)   Frequency -- -- -- -- Add 1 additional day to program exercise sessions.   Initial Home Exercises Provided -- -- -- -- 05/13/20   Row Name 05/14/20 1200 05/27/20 0900 06/11/20 1200 06/12/20 1600 06/26/20 0900     Response to Exercise   Blood Pressure (Admit) 142/60 134/64 138/60 -- 124/82   Blood Pressure (Exercise) 152/80 138/64 148/80 -- 138/84   Blood Pressure (Exit) 140/62 124/62 140/58 -- 120/60   Heart Rate (Admit) 65 bpm 79 bpm 70 bpm -- 63 bpm   Heart Rate (Exercise) 89 bpm 79 bpm 90 bpm -- 18 bpm   Heart Rate (Exit) 76 bpm 54 bpm 78 bpm -- 63 bpm   Rating of Perceived Exertion (Exercise) 11 13 13  -- 14   Symptoms -- none none -- none   Duration Progress to 30 minutes of  aerobic without signs/symptoms of physical distress Continue with 30 min of aerobic exercise without signs/symptoms of physical distress. Continue with 30 min of aerobic exercise without signs/symptoms of physical distress. -- Continue with 30 min of aerobic exercise without signs/symptoms of physical distress.   Intensity THRR unchanged THRR unchanged THRR unchanged -- THRR unchanged     Progression    Progression Continue to progress workloads to maintain intensity without signs/symptoms of physical distress. Continue to progress workloads to maintain intensity without signs/symptoms of physical distress. Continue to progress workloads to maintain intensity without signs/symptoms of physical distress. -- Continue to progress workloads to maintain intensity without signs/symptoms of physical distress.   Average METs 1.5 1.8 2 -- 1.8     Resistance Training   Training Prescription Yes Yes Yes -- Yes   Weight 2 lb 3 lb 3 lb -- 3 lb   Reps 10-15 10-15 10-15 -- 10-15     Interval Training  Interval Training No No No -- No     NuStep   Level 1 1 3  -- 1   SPM -- -- 80 -- --   Minutes 30 30 15  -- 15   METs 1.5 1.6 2 -- 1.6     Biostep-RELP   Level -- 1 -- -- 1   Minutes -- 15 -- -- 15   METs -- 2 -- -- 2     Home Exercise Plan   Plans to continue exercise at -- Home (comment) -- Home (comment)  Chair exercise videos Home (comment)  Chair exercise videos   Frequency -- Add 1 additional day to program exercise sessions. -- Add 1 additional day to program exercise sessions. Add 1 additional day to program exercise sessions.   Initial Home Exercises Provided -- 05/13/20 -- 06/12/20 06/12/20          Exercise Comments:  Exercise Comments    Row Name 03/20/20 1552           Exercise Comments First full day of exercise!  Patient was oriented to gym and equipment including functions, settings, policies, and procedures.  Patient's individual exercise prescription and treatment plan were reviewed.  All starting workloads were established based on the results of the 6 minute walk test done at initial orientation visit.  The plan for exercise progression was also introduced and progression will be customized based on patient's performance and goals.              Exercise Goals and Review:  Exercise Goals    Row Name 03/18/20 1537             Exercise Goals   Increase Physical  Activity Yes       Intervention Provide advice, education, support and counseling about physical activity/exercise needs.;Develop an individualized exercise prescription for aerobic and resistive training based on initial evaluation findings, risk stratification, comorbidities and participant's personal goals.       Expected Outcomes Short Term: Attend rehab on a regular basis to increase amount of physical activity.;Long Term: Add in home exercise to make exercise part of routine and to increase amount of physical activity.;Long Term: Exercising regularly at least 3-5 days a week.       Increase Strength and Stamina Yes       Intervention Provide advice, education, support and counseling about physical activity/exercise needs.;Develop an individualized exercise prescription for aerobic and resistive training based on initial evaluation findings, risk stratification, comorbidities and participant's personal goals.       Expected Outcomes Short Term: Increase workloads from initial exercise prescription for resistance, speed, and METs.;Short Term: Perform resistance training exercises routinely during rehab and add in resistance training at home;Long Term: Improve cardiorespiratory fitness, muscular endurance and strength as measured by increased METs and functional capacity ( )       Able to understand and use rate of perceived exertion (RPE) scale Yes       Intervention Provide education and explanation on how to use RPE scale       Expected Outcomes Short Term: Able to use RPE daily in rehab to express subjective intensity level;Long Term:  Able to use RPE to guide intensity level when exercising independently       Able to understand and use Dyspnea scale Yes       Intervention Provide education and explanation on how to use Dyspnea scale       Expected Outcomes Short Term: Able to use Dyspnea scale daily  in rehab to express subjective sense of shortness of breath during exertion;Long Term: Able to  use Dyspnea scale to guide intensity level when exercising independently       Knowledge and understanding of Target Heart Rate Range (THRR) Yes       Intervention Provide education and explanation of THRR including how the numbers were predicted and where they are located for reference       Expected Outcomes Short Term: Able to state/look up THRR;Short Term: Able to use daily as guideline for intensity in rehab;Long Term: Able to use THRR to govern intensity when exercising independently       Able to check pulse independently Yes       Intervention Provide education and demonstration on how to check pulse in carotid and radial arteries.;Review the importance of being able to check your own pulse for safety during independent exercise       Expected Outcomes Short Term: Able to explain why pulse checking is important during independent exercise;Long Term: Able to check pulse independently and accurately       Understanding of Exercise Prescription Yes       Intervention Provide education, explanation, and written materials on patient's individual exercise prescription       Expected Outcomes Short Term: Able to explain program exercise prescription;Long Term: Able to explain home exercise prescription to exercise independently              Exercise Goals Re-Evaluation :  Exercise Goals Re-Evaluation    Row Name 03/20/20 1552 04/03/20 1347 04/08/20 1554 04/17/20 0818 04/29/20 1143     Exercise Goal Re-Evaluation   Exercise Goals Review Increase Physical Activity;Able to understand and use rate of perceived exertion (RPE) scale;Knowledge and understanding of Target Heart Rate Range (THRR);Understanding of Exercise Prescription;Able to understand and use Dyspnea scale;Increase Strength and Stamina;Able to check pulse independently Increase Physical Activity;Increase Strength and Stamina;Understanding of Exercise Prescription -- Increase Physical Activity;Increase Strength and Stamina Increase  Physical Activity;Increase Strength and Stamina;Understanding of Exercise Prescription   Comments Reviewed RPE and dyspnea scales, THR and program prescription with pt today.  Pt voiced understanding and was given a copy of goals to take home. Seraiah is off to a good start in rehab.  She is getting her full 30 min of exericse now.  We will continue to montior her progress. Amando reports some mid to low back pain on machines.  A pillow behind back seems to help. Shanyla has attended consistently this month.  She has reached her THR range on machines.  Staff will monitor progress. Cerra has been doing well in rehab.  She is up to 2 min on the treadmill!  We will continue to monitor her progress.   Expected Outcomes Short: Use RPE daily to regulate intensity. Long: Follow program prescription in THR. Short: Continue to attend regularly Long: Continue to follow progream prescription Short:  try machines without arms Long: build overall stamina Short: continue to attend consistently Long:  build stamina Short: Increase level on NuStep Long: Continue to improve stamina   Row Name 05/13/20 1548 05/13/20 1641 05/23/20 1541 06/11/20 1238 06/12/20 1559     Exercise Goal Re-Evaluation   Exercise Goals Review -- Increase Physical Activity;Increase Strength and Stamina Increase Physical Activity;Increase Strength and Stamina;Understanding of Exercise Prescription Increase Physical Activity;Increase Strength and Stamina Increase Physical Activity;Able to understand and use rate of perceived exertion (RPE) scale;Knowledge and understanding of Target Heart Rate Range (THRR);Understanding of Exercise Prescription;Increase Strength and Stamina;Able  to understand and use Dyspnea scale;Able to check pulse independently   Comments -- Reviewed home exercise with pt today.  Pt plans to use staff videos and consider [employer] for exercise.  Reviewed THR, pulse, RPE, sign and symptoms, pulse oximetery and when to call 911 or MD.   Also discussed weather considerations and indoor options.  Pt voiced understanding. Niomie is doing well in rehab.  She says that she cannot really tell much difference yet. She has not started home exercise yet.  We talked about using the staff videos and her son was also present to listen in to help her at home. Kateryna uses the T4 nustep every session.  She has increased her level to 3.  She is doing as well as can be expected. Reviewed home exercise with pt today.  Pt plans to walk, youtube videos, resistance bands for exercise.  Reviewed THR, pulse, RPE, sign and symptoms, pulse oximetery and when to call 911 or MD.  Also discussed weather considerations and indoor options.  Pt voiced understanding.   Expected Outcomes -- Short: begin exercising one day per week outside program sessions Short: Start to add in staff video exercises at home.  Long: Continue to improve stamina. Short: try strength videos at home Long: build overall stamina Short start to add in chair exercise videos at home  Long exercise independently   Row Name 06/20/20 1608 06/26/20 0908           Exercise Goal Re-Evaluation   Exercise Goals Review Increase Physical Activity;Able to understand and use rate of perceived exertion (RPE) scale;Knowledge and understanding of Target Heart Rate Range (THRR);Understanding of Exercise Prescription;Increase Strength and Stamina;Able to understand and use Dyspnea scale;Able to check pulse independently Increase Physical Activity;Increase Strength and Stamina;Understanding of Exercise Prescription      Comments Alyrica is planning on using bands that she bought and hand weights in addition to walking as her plan to continue to exercise after graduation. She was given a packet of how to use the bands and feels comfortable using them on her own. Jeni is nearing graduation.  She did not improve her distance on her post walk, but her RPE was lower.  She feels that she has more energy and plans to  start PT for her neck after rehab.      Expected Outcomes Short: add a day of walking with bands/weights. Long: maintain independence with exercise. Short: Graduate rehab Long: Continue to exercise independently             Discharge Exercise Prescription (Final Exercise Prescription Changes):  Exercise Prescription Changes - 06/26/20 0900      Response to Exercise   Blood Pressure (Admit) 124/82    Blood Pressure (Exercise) 138/84    Blood Pressure (Exit) 120/60    Heart Rate (Admit) 63 bpm    Heart Rate (Exercise) 18 bpm    Heart Rate (Exit) 63 bpm    Rating of Perceived Exertion (Exercise) 14    Symptoms none    Duration Continue with 30 min of aerobic exercise without signs/symptoms of physical distress.    Intensity THRR unchanged      Progression   Progression Continue to progress workloads to maintain intensity without signs/symptoms of physical distress.    Average METs 1.8      Resistance Training   Training Prescription Yes    Weight 3 lb    Reps 10-15      Interval Training   Interval Training No  NuStep   Level 1    Minutes 15    METs 1.6      Biostep-RELP   Level 1    Minutes 15    METs 2      Home Exercise Plan   Plans to continue exercise at Home (comment)   Chair exercise videos   Frequency Add 1 additional day to program exercise sessions.    Initial Home Exercises Provided 06/12/20           Nutrition:  Target Goals: Understanding of nutrition guidelines, daily intake of sodium 1500mg , cholesterol 200mg , calories 30% from fat and 7% or less from saturated fats, daily to have 5 or more servings of fruits and vegetables.  Education: All About Nutrition: -Group instruction provided by verbal, written material, interactive activities, discussions, models, and posters to present general guidelines for heart healthy nutrition including fat, fiber, MyPlate, the role of sodium in heart healthy nutrition, utilization of the nutrition label, and  utilization of this knowledge for meal planning. Follow up email sent as well. Written material given at graduation.   Biometrics:  Pre Biometrics - 03/18/20 1540      Pre Biometrics   Height 5' 1.5" (1.562 m)    Weight 139 lb 12.8 oz (63.4 kg)    BMI (Calculated) 25.99           Post Biometrics - 06/13/20 1547       Post  Biometrics   Height 5' 1.5" (1.562 m)    Weight 137 lb 12.8 oz (62.5 kg)    BMI (Calculated) 25.62           Nutrition Therapy Plan and Nutrition Goals:  Nutrition Therapy & Goals - 06/03/20 1544      Nutrition Therapy   Diet Heart healthy, low Na    Drug/Food Interactions Statins/Certain Fruits    Protein (specify units) 50g    Fiber 25 grams    Whole Grain Foods 3 servings    Saturated Fats 12 max. grams    Fruits and Vegetables 8 servings/day    Sodium 1.5 grams      Personal Nutrition Goals   Nutrition Goal ST:LT: Meghanne would not like to make any changes at this time    Comments B: cereal (cinnamon toast crunch, cherrios - skim milk or 1% milk), toast (white) - PB and J, coffee (creamer), orange juice S: candy - sugarfree D: go out - hardies, wendys, cafeteria, cracker barrell Drinks: water Her sister is very picky who she lives with. She will cook a meal on sunday for dinner like meatloaf or fish. She doesn't eat much chicken. 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 Discussed heart healthy eating. Her son goes food shopping for her. Discussed heart healthy eating and small modifications such as choosing more vegetables and less red meat when going out. Will continue to check in - she does not want to make any chnages at this time.      Intervention Plan   Intervention Prescribe, educate and counsel regarding individualized specific dietary modifications aiming towards targeted core components such as weight, hypertension, lipid management, diabetes, heart failure and other comorbidities.;Nutrition  handout(s) given to patient.    Expected Outcomes Short Term Goal: Understand basic principles of dietary content, such as calories, fat, sodium, cholesterol and nutrients.;Short Term Goal: A plan has been developed with personal nutrition goals set during dietitian appointment.;Long Term Goal: Adherence to  prescribed nutrition plan.           Nutrition Assessments:  MEDIFICTS Score Key:  ?70 Need to make dietary changes   40-70 Heart Healthy Diet  ? 40 Therapeutic Level Cholesterol Diet  Flowsheet Row [facility]  Picture Your Plate Total Score on Discharge 58     Picture Your Plate Scores:  <53<40 Unhealthy dietary pattern with much room for improvement.  41-50 Dietary pattern unlikely to meet recommendations for good health and room for improvement.  51-60 More healthful dietary pattern, with some room for improvement.   >60 Healthy dietary pattern, although there may be some specific behaviors that could be improved.    Nutrition Goals Re-Evaluation:  Nutrition Goals Re-Evaluation    Row Name 05/13/20 1555 05/23/20 1549           Goals   Nutrition Goal -- Meet with nutritionist      Comment Scheduled nutrition appointment She doesn't cook as much as she used to.  There are theee people in her house and they all eat different.  She wants to maintain weight.      Expected Outcome -- Meet with nutritionist             Nutrition Goals Discharge (Final Nutrition Goals Re-Evaluation):  Nutrition Goals Re-Evaluation - 05/23/20 1549      Goals   Nutrition Goal Meet with nutritionist    Comment She doesn't cook as much as she used to.  There are theee people in her house and they all eat different.  She wants to maintain weight.    Expected Outcome Meet with nutritionist           Psychosocial: Target Goals: Acknowledge presence or absence of significant depression and/or stress, maximize coping skills, provide  positive support system. Participant is able to verbalize types and ability to use techniques and skills needed for reducing stress and depression.   Education: Stress, Anxiety, and Depression - Group verbal and visual presentation to define topics covered.  Reviews how body is impacted by stress, anxiety, and depression.  Also discusses healthy ways to reduce stress and to treat/manage anxiety and depression.  Written material given at graduation.   Education: Sleep Hygiene -Provides group verbal and written instruction about how sleep can affect your health.  Define sleep hygiene, discuss sleep cycles and impact of sleep habits. Review good sleep hygiene tips.    Initial Review & Psychosocial Screening:  Initial Psych Review & Screening - 03/08/20 1521      Initial Review   Current issues with Current Stress Concerns    Source of Stress Concerns Unable to perform yard/household activities;Chronic Illness      Family Dynamics   Good Support System? Yes   son, sister     Barriers   Psychosocial barriers to participate in program There are no identifiable barriers or psychosocial needs.      Screening Interventions   Interventions Encouraged to exercise;Provide feedback about the scores to participant;To provide support and resources with identified psychosocial needs    Expected Outcomes Short Term goal: Utilizing psychosocial counselor, staff and physician to assist with identification of specific Stressors or current issues interfering with healing process. Setting desired goal for each stressor or current issue identified.;Long Term Goal: Stressors or current issues are controlled or eliminated.;Short Term goal: Identification and review with participant of any Quality of Life or Depression concerns found by scoring the questionnaire.;Long Term goal: The  participant improves quality of Life and PHQ9 Scores as seen by post scores and/or verbalization of changes           Quality of  Life Scores:   Quality of Life - 06/20/20 1603      Quality of Life   Select Quality of Life      Quality of Life Scores   Health/Function Pre 26.4 %    Health/Function Post 26 %    Health/Function % Change -1.52 %    Socioeconomic Pre 26 %    Socioeconomic Post 24.42 %    Socioeconomic % Change  -6.08 %    Psych/Spiritual Pre 26.57 %    Psych/Spiritual Post 27.21 %    Psych/Spiritual % Change 2.41 %    Family Pre 28.5 %    Family Post 30 %    Family % Change 5.26 %    GLOBAL Pre 26.66 %    GLOBAL Post 26.25 %    GLOBAL % Change -1.54 %          Scores of 19 and below usually indicate a poorer quality of life in these areas.  A difference of  2-3 points is a clinically meaningful difference.  A difference of 2-3 points in the total score of the Quality of Life Index has been associated with significant improvement in overall quality of life, self-image, physical symptoms, and general health in studies assessing change in quality of life.  PHQ-9: Recent Review Flowsheet Data    Depression screen [facility] 2/9 06/20/2020 03/18/2020   Decreased Interest 1 1   Down, Depressed, Hopeless 1 0   PHQ - 2 Score 2 1   Altered sleeping 0 0   Tired, decreased energy 0 1   Change in appetite 0 0   Feeling bad or failure about yourself  0 0   Trouble concentrating 0 0   Moving slowly or fidgety/restless 0 0   Suicidal thoughts 0 0   PHQ-9 Score 2 2   Difficult doing work/chores Not difficult at all Not difficult at all     Interpretation of Total Score  Total Score Depression Severity:  1-4 = Minimal depression, 5-9 = Mild depression, 10-14 = Moderate depression, 15-19 = Moderately severe depression, 20-27 = Severe depression   Psychosocial Evaluation and Intervention:  Psychosocial Evaluation - 04/08/20 1602      Psychosocial Evaluation & Interventions   Comments Today is Dajon's 5 th session.  Her neck and back still bothers her.  She doesnt report any signs of depression or anxiety.            Psychosocial Re-Evaluation:  Psychosocial Re-Evaluation    Row Name 04/08/20 1602 04/08/20 1606 05/13/20 1550 05/23/20 1547 06/20/20 1613     Psychosocial Re-Evaluation   Current issues with Current Stress Concerns None Identified None Identified None Identified None Identified   Comments -- Today is Shaneca's 5 th session.  Her neck and back still bothers her.  She doesnt report any signs of depression or anxiety. She reports feeling tense sometimes- she reports being prescribed medication (xanax) for it but doesn't take it all the time - she reports not liking to. She relaxes by reclining in the chair at home, she loves sudoku and word finds. She reports calling friends for support. Lynnsie is doing well in rehab.  She feels like she is doing well mentally and denies any major stressors.  She is sleeping good when she sleeps.  She also naps in  the chair when she rest. Lucretia continues to do well in rehab and is getting ready to graduate soon. She has really enjoyed coming to rehab and getting a routine established. She hopes to maintain the exercise routine at home. Her son is supportive of her lifestyle management. Her main source of fatigue is her continual neck issues which she is talking to her PCP about at the next appointment to see if PT would be a good option. She is motivated to keep up the progress as she feels so much better!   Expected Outcomes -- Short: follow up with Dr about back and neck Long: maintain positive outlook ST: Continue to use medication as needed LT: mainain positive outlook Short: Continue to work on sleep Long: Continue to stay positive. Short: stick with the exercise routine she has established. Long: maintain positive lifestyle changes.   Interventions -- -- Encouraged to attend Cardiac Rehabilitation for the exercise Encouraged to attend Cardiac Rehabilitation for the exercise Encouraged to attend Cardiac Rehabilitation for the exercise   Continue  Psychosocial Services  -- -- Follow up required by staff Follow up required by staff --     Initial Review   Source of Stress Concerns -- -- Unable to perform yard/household activities;Chronic Illness -- --          Psychosocial Discharge (Final Psychosocial Re-Evaluation):  Psychosocial Re-Evaluation - 06/20/20 1613      Psychosocial Re-Evaluation   Current issues with None Identified    Comments Dayami continues to do well in rehab and is getting ready to graduate soon. She has really enjoyed coming to rehab and getting a routine established. She hopes to maintain the exercise routine at home. Her son is supportive of her lifestyle management. Her main source of fatigue is her continual neck issues which she is talking to her PCP about at the next appointment to see if PT would be a good option. She is motivated to keep up the progress as she feels so much better!    Expected Outcomes Short: stick with the exercise routine she has established. Long: maintain positive lifestyle changes.    Interventions Encouraged to attend Cardiac Rehabilitation for the exercise           Vocational Rehabilitation: Provide vocational rehab assistance to qualifying candidates.   Vocational Rehab Evaluation & Intervention:  Vocational Rehab - 03/08/20 1521      Initial Vocational Rehab Evaluation & Intervention   Assessment shows need for Vocational Rehabilitation No           Education: Education Goals: Education classes will be provided on a variety of topics geared toward better understanding of heart health and risk factor modification. Participant will state understanding/return demonstration of topics presented as noted by education test scores.  Learning Barriers/Preferences:  Learning Barriers/Preferences - 03/08/20 1520      Learning Barriers/Preferences   Learning Barriers None    Learning Preferences None           General Cardiac Education Topics:  AED/CPR: - Group  verbal and written instruction with the use of models to demonstrate the basic use of the AED with the basic ABC's of resuscitation.   Anatomy and Cardiac Procedures: - Group verbal and visual presentation and models provide information about basic cardiac anatomy and function. Reviews the testing methods done to diagnose heart disease and the outcomes of the test results. Describes the treatment choices: Medical Management, Angioplasty, or Coronary Bypass Surgery for treating various heart conditions including Myocardial  Infarction, Angina, Valve Disease, and Cardiac Arrhythmias.  Written material given at graduation. Flowsheet Row Cardiac Rehab from 06/12/2020 in [facility] Cardiac and Pulmonary Rehab  Date 06/12/20  Educator [facility]  Instruction Review Code 1- Verbalizes Understanding      Medication Safety: - Group verbal and visual instruction to review commonly prescribed medications for heart and lung disease. Reviews the medication, class of the drug, and side effects. Includes the steps to properly store meds and maintain the prescription regimen.  Written material given at graduation.   Intimacy: - Group verbal instruction through game format to discuss how heart and lung disease can affect sexual intimacy. Written material given at graduation.. Flowsheet Row Cardiac Rehab from 06/12/2020 in [facility] Cardiac and Pulmonary Rehab  Date 04/17/20  Educator AS  Instruction Review Code 1- Verbalizes Understanding      Know Your Numbers and Heart Failure: - Group verbal and visual instruction to discuss disease risk factors for cardiac and pulmonary disease and treatment options.  Reviews associated critical values for Overweight/Obesity, Hypertension, Cholesterol, and Diabetes.  Discusses basics of heart failure: signs/symptoms and treatments.  Introduces Heart Failure Zone chart for action plan for heart failure.  Written material given at graduation.   Infection Prevention: - Provides verbal and  written material to individual with discussion of infection control including proper hand washing and proper equipment cleaning during exercise session.   Falls Prevention: - Provides verbal and written material to individual with discussion of falls prevention and safety.   Other: -Provides group and verbal instruction on various topics (see comments)   Knowledge Questionnaire Score:  Knowledge Questionnaire Score - 06/20/20 1603      Knowledge Questionnaire Score   Post Score 24/26           Core Components/Risk Factors/Patient Goals at Admission:  Personal Goals and Risk Factors at Admission - 03/18/20 1542      Core Components/Risk Factors/Patient Goals on Admission    Weight Management Weight Maintenance    Diabetes Yes    Intervention Provide education about signs/symptoms and action to take for hypo/hyperglycemia.    Expected Outcomes Short Term: Participant verbalizes understanding of the signs/symptoms and immediate care of hyper/hypoglycemia, proper foot care and importance of medication, aerobic/resistive exercise and nutrition plan for blood glucose control.;Long Term: Attainment of HbA1C < 7%.    Hypertension Yes    Intervention Provide education on lifestyle modifcations including regular physical activity/exercise, weight management, moderate sodium restriction and increased consumption of fresh fruit, vegetables, and low fat dairy, alcohol moderation, and smoking cessation.;Monitor prescription use compliance.    Lipids Yes    Intervention Provide education and support for participant on nutrition & aerobic/resistive exercise along with prescribed medications to achieve LDL 70mg , HDL >40mg .    Expected Outcomes Short Term: Participant states understanding of desired cholesterol values and is compliant with medications prescribed. Participant is following exercise prescription and nutrition guidelines.;Long Term: Cholesterol controlled with medications as prescribed,  with individualized exercise RX and with personalized nutrition plan. Value goals: LDL < 70mg , HDL > 40 mg.           Education:Diabetes - Individual verbal and written instruction to review signs/symptoms of diabetes, desired ranges of glucose level fasting, after meals and with exercise. Acknowledge that pre and post exercise glucose checks will be done for 3 sessions at entry of program. Flowsheet Row Cardiac Rehab from 06/12/2020 in [facility] Cardiac and Pulmonary Rehab  Date 03/08/20  Educator [facility]  Instruction Review Code 1- Verbalizes Understanding  [  diet controlled]      Core Components/Risk Factors/Patient Goals Review:   Goals and Risk Factor Review    Row Name 04/08/20 1551 05/13/20 1556 05/23/20 1551 06/20/20 1610       Core Components/Risk Factors/Patient Goals Review   Personal Goals Review Hypertension;Lipids;Diabetes Hypertension;Lipids;Diabetes Hypertension;Diabetes;Weight Management/Obesity Weight Management/Obesity;Hypertension;Diabetes    Review Taleeya reports taking all meds as directed.  She says her neck hurts when she tries to hold her head up.  Pt has forward head posture.  Staff suggested she ask Dr about PT.  She hasnt been checking BG at home.  She checks BP at home periodically. Zanita reports taking all meds as directed.  She says her neck hurts when she tries to hold her head up - she reports no difference.  Pt has forward head posture.  Staff suggested she ask Dr about PT - she has a physical coming up in March; encouraged to talk to doctor sooner.  She hasnt been checking BG at home.  She checks BP at home periodically. Khaleesi likes to maintain her weight under 135 lb and she feels good there.  She continues to struggle with neck fatigue and fatigue in general.  We talked about adding in exercise at home to help with fatigue and strength.  She saw Dr. Darrold Junker PA last week and a good report.  Her pressures have been doing well overall.  She has not been checking her  sugars routinely.  She wants to try to get back to habit of checking more frequently. Dollie's blood pressure and diabetes continues to stay stable. She is still working on checking her sugars routinely, but is feeling well. Her weight is stable between 135-139 so she is happy with that. Her neck fatigue is still present and she is planning on talking with her PCP at her next appt (next month) about getting referred to PT.    Expected Outcomes Short: check with Dr about neck pain and how often to check BG Long:  manage risk factors Short: check with Dr about neck pain and how often to check BG, check BP more often when not at rehab Long:  manage risk factors Short: Get back into habit of check pressures and sugars.  Long; Continue to monitor risk factors. Short: talk with MD about referral for PT so she can continue to exercise; check sugars routinely. Long: maintain independence in managing risk factors.           Core Components/Risk Factors/Patient Goals at Discharge (Final Review):   Goals and Risk Factor Review - 06/20/20 1610      Core Components/Risk Factors/Patient Goals Review   Personal Goals Review Weight Management/Obesity;Hypertension;Diabetes    Review Geena's blood pressure and diabetes continues to stay stable. She is still working on checking her sugars routinely, but is feeling well. Her weight is stable between 135-139 so she is happy with that. Her neck fatigue is still present and she is planning on talking with her PCP at her next appt (next month) about getting referred to PT.    Expected Outcomes Short: talk with MD about referral for PT so she can continue to exercise; check sugars routinely. Long: maintain independence in managing risk factors.           ITP Comments:  ITP Comments    Row Name 03/08/20 1526 03/18/20 1553 03/20/20 1035 03/20/20 1551 04/17/20 0628   ITP Comments Initial telephone orientation completed. Diagnosis can be found in [facility] 8/22. EP orientation  scheduled  for Monday, 11/29 at 1pm. Completed and gym orientation. Initial ITP created and sent for review to Dr. Bethann Punches, Medical Director. 30 Day review completed. Medical Director ITP review done, changes made as directed, and signed approval by Medical Director.  New to program First full day of exercise!  Patient was oriented to gym and equipment including functions, settings, policies, and procedures.  Patient's individual exercise prescription and treatment plan were reviewed.  All starting workloads were established based on the results of the 6 minute walk test done at initial orientation visit.  The plan for exercise progression was also introduced and progression will be customized based on patient's performance and goals. 30 Day review completed. Medical Director ITP review done, changes made as directed, and signed approval by Medical Director.   Row Name 05/15/20 [phone] 06/12/20 0702 07/01/20 1557       ITP Comments 30 Day review completed. Medical Director ITP review done, changes made as directed, and signed approval by Medical Director. 30 Day review completed. Medical Director ITP review done, changes made as directed, and signed approval by Medical Director. Naiara graduated today from  rehab with 36 sessions completed.  Details of the patient's exercise prescription and what She needs to do in order to continue the prescription and progress were discussed with patient.  Patient was given a copy of prescription and goals.  Patient verbalized understanding.  Arleene plans to continue to exercise by investigating the [facility] to continue exercising.            Comments: discharge ITP

## 2020-07-01 NOTE — Progress Notes (Signed)
Discharge Progress Report  Patient Details  Name: [name] MRN: [id number] Date of Birth: Sep 07, 1930 Referring Provider:   Flowsheet Row Cardiac Rehab from 03/18/2020 in [facility] Cardiac [facility] Pul[facility]  Referring Provider [name]       Number of Visits: 71  Reason for Discharge:  Patient reached a stable level of exercise. Patient independent in their exercise. Patient has met program and personal goals.  Smoking History:  Social History   Tobacco Use  Smoking Status Never Smoker  Smokeless Tobacco Never Used    Diagnosis:  Status post coronary artery stent placement  NSTEMI (non-ST elevated myocardial infarction) ([place])  ADL [facility]:   Initial Exercise Prescription:  Initial Exercise Prescription - 03/18/20 1500      Date of Initial Exercise RX and Referring Provider   Date 03/18/20    Referring Provider [name]      Treadmill   MPH 1    Grade 0    Minutes 15    METs 1.77      Recumbant Bike   Level 1    RPM 60    Minutes 15    METs 1      NuStep   Level 1    SPM 80    Minutes 15    METs 1      REL-XR   Level 1    Speed 50    Minutes 15    METs 1      Biostep-RELP   Level 1    SPM 50    Minutes 15    METs 1      Prescription Details   Frequency (times per week) 3    Duration Progress to 30 minutes of continuous aerobic without signs/symptoms of physical distress      Intensity   THRR 40-80% of Max Heartrate 92-119    Ratings of Perceived Exertion 11-15    Perceived Dyspnea 0-4      Resistance Training   Training Prescription Yes    Weight 3 lb    Reps 10-15           Discharge Exercise Prescription (Final Exercise Prescription Changes):  Exercise Prescription Changes - 06/26/20 0900      Response to Exercise   Blood Pressure (Admit) 124/82    Blood Pressure (Exercise) 138/84    Blood Pressure (Exit) 120/60    Heart Rate (Admit) 63 bpm    Heart Rate (Exercise) 18 bpm    Heart Rate (Exit) 63 bpm    Rating of  Perceived Exertion (Exercise) 14    Symptoms none    Duration Continue with 30 min of aerobic exercise without signs/symptoms of physical distress.    Intensity THRR unchanged      Progression   Progression Continue to progress workloads to maintain intensity without signs/symptoms of physical distress.    Average METs 1.8      Resistance Training   Training Prescription Yes    Weight 3 lb    Reps 10-15      Interval Training   Interval Training No      NuStep   Level 1    Minutes 15    METs 1.6      Biostep-RELP   Level 1    Minutes 15    METs 2      Home Exercise Plan   Plans to continue exercise at Home (comment)   Chair exercise videos   Frequency Add 1 additional day to program  exercise sessions.    Initial Home Exercises Provided 06/12/20           Functional Capacity:  6 Minute Walk    Row Name 03/18/20 1531 06/13/20 1545       6 Minute Walk   Phase Initial Discharge    Distance 765 feet 665 feet    Distance % Change -- -13.1 %    Distance Feet Change -- -100 ft    Walk Time 5.5 minutes 6 minutes    # of Rest Breaks 1 --    MPH 1.58 1.26    METS 1.07 0.74    RPE 13 11    Perceived Dyspnea  1 --    VO2 Peak 3.75 2.6    Symptoms Yes (comment) Yes (comment)    Comments leg fatigue pain in shoulders    Resting HR 64 bpm 82 bpm    Resting BP 138/50 130/58    Resting Oxygen Saturation  100 % --    Exercise Oxygen Saturation  during 6 min walk 99 % --    Max Ex. HR 91 bpm 80 bpm    Max Ex. BP 154/50 146/60    2 Minute Post BP 124/50 --           Psychological, QOL, Others - Outcomes: PHQ 2/9: Depression screen [facility] 2/9 06/20/2020 03/18/2020  Decreased Interest 1 1  Down, Depressed, Hopeless 1 0  PHQ - 2 Score 2 1  Altered sleeping 0 0  Tired, decreased energy 0 1  Change in appetite 0 0  Feeling bad or failure about yourself  0 0  Trouble concentrating 0 0  Moving slowly or fidgety/restless 0 0  Suicidal thoughts 0 0  PHQ-9 Score 2 2   Difficult doing work/chores Not difficult at all Not difficult at all    Quality of Life:  Quality of Life - 06/20/20 1603      Quality of Life   Select Quality of Life      Quality of Life Scores   Health/Function Pre 26.4 %    Health/Function Post 26 %    Health/Function % Change -1.52 %    Socioeconomic Pre 26 %    Socioeconomic Post 24.42 %    Socioeconomic % Change  -6.08 %    Psych/Spiritual Pre 26.57 %    Psych/Spiritual Post 27.21 %    Psych/Spiritual % Change 2.41 %    Family Pre 28.5 %    Family Post 30 %    Family % Change 5.26 %    GLOBAL Pre 26.66 %    GLOBAL Post 26.25 %    GLOBAL % Change -1.54 %           Nutrition & Weight - Outcomes:  Pre Biometrics - 03/18/20 1540      Pre Biometrics   Height 5' 1.5" (1.562 m)    Weight 139 lb 12.8 oz (63.4 kg)    BMI (Calculated) 25.99           Post Biometrics - 06/13/20 1547       Post  Biometrics   Height 5' 1.5" (1.562 m)    Weight 137 lb 12.8 oz (62.5 kg)    BMI (Calculated) 25.62           Nutrition:  Nutrition Therapy & Goals - 06/03/20 1544      Nutrition Therapy   Diet Heart healthy, low Na    Drug/Food Interactions Statins/Certain Fruits  Protein (specify units) 50g    Fiber 25 grams    Whole Grain Foods 3 servings    Saturated Fats 12 max. grams    Fruits and Vegetables 8 servings/day    Sodium 1.5 grams      Personal Nutrition Goals   Nutrition Goal ST:LT: [name] would not like to make any changes at this time    Comments B: cereal (cinnamon toast crunch, cherrios - skim milk or 1% milk), toast (white) - PB and J, coffee (creamer), orange juice S: candy - sugarfree D: go out - hardies, wendys, cafeteria, cracker barrell Drinks: water Her sister is very picky who she lives with. She will cook a meal on sunday for dinner like meatloaf or fish. She doesn't eat much chicken. 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 Discussed heart healthy  eating. Her son goes food shopping for her. Discussed heart healthy eating and small modifications such as choosing more vegetables and less red meat when going out. Will continue to check in - she does not want to make any chnages at this time.      Intervention Plan   Intervention Prescribe, educate and counsel regarding individualized specific dietary modifications aiming towards targeted core components such as weight, hypertension, lipid management, diabetes, heart failure and other comorbidities.;Nutrition handout(s) given to patient.    Expected Outcomes Short Term Goal: Understand basic principles of dietary content, such as calories, fat, sodium, cholesterol and nutrients.;Short Term Goal: A plan has been developed with personal nutrition goals set during dietitian appointment.;Long Term Goal: Adherence to prescribed nutrition plan.           Nutrition Discharge:   Education Questionnaire Score:  Knowledge Questionnaire Score - 06/20/20 1603      Knowledge Questionnaire Score   Post Score 24/26           Goals reviewed with patient; copy given to patient.

## 2020-08-26 ENCOUNTER — Other Ambulatory Visit: Payer: Self-pay

## 2020-08-26 ENCOUNTER — Emergency Department: Payer: Medicare Other

## 2020-08-26 ENCOUNTER — Emergency Department
Admission: EM | Admit: 2020-08-26 | Discharge: 2020-08-26 | Disposition: A | Discharge status: Home / Self Care | Payer: Medicare Other | Attending: Emergency Medicine | Admitting: Emergency Medicine

## 2020-08-26 DIAGNOSIS — E119 Type 2 diabetes mellitus without complications: Secondary | ICD-10-CM | POA: Diagnosis not present

## 2020-08-26 DIAGNOSIS — R079 Chest pain, unspecified: Secondary | ICD-10-CM | POA: Diagnosis not present

## 2020-08-26 DIAGNOSIS — N183 Chronic kidney disease, stage 3 unspecified: Secondary | ICD-10-CM | POA: Insufficient documentation

## 2020-08-26 DIAGNOSIS — R55 Syncope and collapse: Secondary | ICD-10-CM | POA: Diagnosis not present

## 2020-08-26 DIAGNOSIS — Z7984 Long term (current) use of oral hypoglycemic drugs: Secondary | ICD-10-CM | POA: Insufficient documentation

## 2020-08-26 DIAGNOSIS — Z7982 Long term (current) use of aspirin: Secondary | ICD-10-CM | POA: Diagnosis not present

## 2020-08-26 DIAGNOSIS — R410 Disorientation, unspecified: Secondary | ICD-10-CM | POA: Diagnosis not present

## 2020-08-26 DIAGNOSIS — I129 Hypertensive chronic kidney disease with stage 1 through stage 4 chronic kidney disease, or unspecified chronic kidney disease: Secondary | ICD-10-CM | POA: Insufficient documentation

## 2020-08-26 DIAGNOSIS — I2510 Atherosclerotic heart disease of native coronary artery without angina pectoris: Secondary | ICD-10-CM | POA: Diagnosis not present

## 2020-08-26 DIAGNOSIS — Z955 Presence of coronary angioplasty implant and graft: Secondary | ICD-10-CM | POA: Diagnosis not present

## 2020-08-26 DIAGNOSIS — Z79899 Other long term (current) drug therapy: Secondary | ICD-10-CM | POA: Diagnosis not present

## 2020-08-26 DIAGNOSIS — F0390 Unspecified dementia without behavioral disturbance: Secondary | ICD-10-CM | POA: Insufficient documentation

## 2020-08-26 LAB — CBC WITH DIFFERENTIAL/PLATELET
Abs Immature Granulocytes: 0.03 10*3/uL (ref 0.00–0.07)
Basophils Absolute: 0.1 10*3/uL (ref 0.0–0.1)
Basophils Relative: 1 %
Eosinophils Absolute: 0.5 10*3/uL (ref 0.0–0.5)
Eosinophils Relative: 6 %
HCT: 39.8 % (ref 36.0–46.0)
Hemoglobin: 13.7 g/dL (ref 12.0–15.0)
Immature Granulocytes: 0 %
Lymphocytes Relative: 32 %
Lymphs Abs: 2.6 10*3/uL (ref 0.7–4.0)
MCH: 32.2 pg (ref 26.0–34.0)
MCHC: 34.4 g/dL (ref 30.0–36.0)
MCV: 93.4 fL (ref 80.0–100.0)
Monocytes Absolute: 0.6 10*3/uL (ref 0.1–1.0)
Monocytes Relative: 8 %
Neutro Abs: 4.2 10*3/uL (ref 1.7–7.7)
Neutrophils Relative %: 53 %
Platelets: 347 10*3/uL (ref 150–400)
RBC: 4.26 MIL/uL (ref 3.87–5.11)
RDW: 13.3 % (ref 11.5–15.5)
WBC: 8.1 10*3/uL (ref 4.0–10.5)
nRBC: 0 % (ref 0.0–0.2)

## 2020-08-26 LAB — URINALYSIS, COMPLETE (UACMP) WITH MICROSCOPIC
Bacteria, UA: NONE SEEN
Bilirubin Urine: NEGATIVE
Glucose, UA: NEGATIVE mg/dL
Hgb urine dipstick: NEGATIVE
Ketones, ur: NEGATIVE mg/dL
Leukocytes,Ua: NEGATIVE
Nitrite: NEGATIVE
Protein, ur: NEGATIVE mg/dL
Specific Gravity, Urine: 1.01 (ref 1.005–1.030)
pH: 7 (ref 5.0–8.0)

## 2020-08-26 LAB — COMPREHENSIVE METABOLIC PANEL
ALT: 14 U/L (ref 0–44)
AST: 17 U/L (ref 15–41)
Albumin: 4.7 g/dL (ref 3.5–5.0)
Alkaline Phosphatase: 86 U/L (ref 38–126)
Anion gap: 11 (ref 5–15)
BUN: 20 mg/dL (ref 8–23)
CO2: 27 mmol/L (ref 22–32)
Calcium: 10.2 mg/dL (ref 8.9–10.3)
Chloride: 100 mmol/L (ref 98–111)
Creatinine, Ser: 1.29 mg/dL — ABNORMAL HIGH (ref 0.44–1.00)
GFR, Estimated: 40 mL/min — ABNORMAL LOW (ref >60)
Glucose, Bld: 173 mg/dL — ABNORMAL HIGH (ref 70–99)
Potassium: 3.9 mmol/L (ref 3.5–5.1)
Sodium: 138 mmol/L (ref 135–145)
Total Bilirubin: 0.8 mg/dL (ref 0.3–1.2)
Total Protein: 7.9 g/dL (ref 6.5–8.1)

## 2020-08-26 LAB — TROPONIN I (HIGH SENSITIVITY)
Troponin I (High Sensitivity): 7 ng/L (ref <18)
Troponin I (High Sensitivity): 7 ng/L (ref <18)

## 2020-08-26 LAB — PROTIME-INR
INR: 0.9 (ref 0.8–1.2)
Prothrombin Time: 12 seconds (ref 11.4–15.2)

## 2020-08-26 NOTE — ED Provider Notes (Signed)
2nd troponin negative. Discussed finding with patient. Will plan on discharging.   Phineas Semen, MD 08/26/20 2101

## 2020-08-26 NOTE — ED Triage Notes (Signed)
Pt to ed acems from home for chest pain. 2 nitro sl given pta 324 asa given pta.  Pt alert and oriented, answers year and place While talking to pt, pt states she is not sure if she was having cp and she thinks she passed out

## 2020-08-26 NOTE — Discharge Instructions (Addendum)
Please seek medical attention for any high fevers, chest pain, shortness of breath, change in behavior, persistent vomiting, bloody stool or any other new or concerning symptoms.  
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
## 2020-12-10 ENCOUNTER — Emergency Department: Payer: Medicare Other

## 2020-12-10 ENCOUNTER — Inpatient Hospital Stay
Admission: EM | Admit: 2020-12-10 | Discharge: 2020-12-20 | DRG: 093 | Disposition: A | Discharge status: Skilled Nursing Facility | Payer: Medicare Other | Attending: Internal Medicine | Admitting: Internal Medicine

## 2020-12-10 DIAGNOSIS — M5030 Other cervical disc degeneration, unspecified cervical region: Secondary | ICD-10-CM | POA: Diagnosis present

## 2020-12-10 DIAGNOSIS — T402X5A Adverse effect of other opioids, initial encounter: Secondary | ICD-10-CM | POA: Diagnosis present

## 2020-12-10 DIAGNOSIS — M25531 Pain in right wrist: Secondary | ICD-10-CM

## 2020-12-10 DIAGNOSIS — Z90710 Acquired absence of both cervix and uterus: Secondary | ICD-10-CM

## 2020-12-10 DIAGNOSIS — R55 Syncope and collapse: Secondary | ICD-10-CM | POA: Diagnosis not present

## 2020-12-10 DIAGNOSIS — N1832 Chronic kidney disease, stage 3b: Secondary | ICD-10-CM | POA: Diagnosis present

## 2020-12-10 DIAGNOSIS — Z7982 Long term (current) use of aspirin: Secondary | ICD-10-CM

## 2020-12-10 DIAGNOSIS — Z7984 Long term (current) use of oral hypoglycemic drugs: Secondary | ICD-10-CM

## 2020-12-10 DIAGNOSIS — R296 Repeated falls: Secondary | ICD-10-CM | POA: Diagnosis present

## 2020-12-10 DIAGNOSIS — Z955 Presence of coronary angioplasty implant and graft: Secondary | ICD-10-CM

## 2020-12-10 DIAGNOSIS — I6529 Occlusion and stenosis of unspecified carotid artery: Secondary | ICD-10-CM | POA: Diagnosis present

## 2020-12-10 DIAGNOSIS — R52 Pain, unspecified: Secondary | ICD-10-CM

## 2020-12-10 DIAGNOSIS — R945 Abnormal results of liver function studies: Secondary | ICD-10-CM

## 2020-12-10 DIAGNOSIS — M659 Synovitis and tenosynovitis, unspecified: Secondary | ICD-10-CM | POA: Diagnosis present

## 2020-12-10 DIAGNOSIS — J189 Pneumonia, unspecified organism: Secondary | ICD-10-CM

## 2020-12-10 DIAGNOSIS — Z7902 Long term (current) use of antithrombotics/antiplatelets: Secondary | ICD-10-CM

## 2020-12-10 DIAGNOSIS — I10 Essential (primary) hypertension: Secondary | ICD-10-CM | POA: Diagnosis present

## 2020-12-10 DIAGNOSIS — F0390 Unspecified dementia without behavioral disturbance: Secondary | ICD-10-CM | POA: Diagnosis present

## 2020-12-10 DIAGNOSIS — E1165 Type 2 diabetes mellitus with hyperglycemia: Secondary | ICD-10-CM | POA: Diagnosis present

## 2020-12-10 DIAGNOSIS — M25442 Effusion, left hand: Secondary | ICD-10-CM | POA: Diagnosis present

## 2020-12-10 DIAGNOSIS — Z20822 Contact with and (suspected) exposure to covid-19: Secondary | ICD-10-CM | POA: Diagnosis present

## 2020-12-10 DIAGNOSIS — E119 Type 2 diabetes mellitus without complications: Secondary | ICD-10-CM

## 2020-12-10 DIAGNOSIS — R509 Fever, unspecified: Secondary | ICD-10-CM | POA: Diagnosis not present

## 2020-12-10 DIAGNOSIS — M25552 Pain in left hip: Secondary | ICD-10-CM

## 2020-12-10 DIAGNOSIS — Z881 Allergy status to other antibiotic agents status: Secondary | ICD-10-CM

## 2020-12-10 DIAGNOSIS — I252 Old myocardial infarction: Secondary | ICD-10-CM

## 2020-12-10 DIAGNOSIS — Z888 Allergy status to other drugs, medicaments and biological substances status: Secondary | ICD-10-CM

## 2020-12-10 DIAGNOSIS — E1122 Type 2 diabetes mellitus with diabetic chronic kidney disease: Secondary | ICD-10-CM | POA: Diagnosis present

## 2020-12-10 DIAGNOSIS — I2510 Atherosclerotic heart disease of native coronary artery without angina pectoris: Secondary | ICD-10-CM | POA: Diagnosis present

## 2020-12-10 DIAGNOSIS — Z880 Allergy status to penicillin: Secondary | ICD-10-CM

## 2020-12-10 DIAGNOSIS — G928 Other toxic encephalopathy: Principal | ICD-10-CM | POA: Diagnosis present

## 2020-12-10 DIAGNOSIS — E7800 Pure hypercholesterolemia, unspecified: Secondary | ICD-10-CM | POA: Diagnosis present

## 2020-12-10 DIAGNOSIS — Z79899 Other long term (current) drug therapy: Secondary | ICD-10-CM

## 2020-12-10 DIAGNOSIS — I129 Hypertensive chronic kidney disease with stage 1 through stage 4 chronic kidney disease, or unspecified chronic kidney disease: Secondary | ICD-10-CM | POA: Diagnosis present

## 2020-12-10 DIAGNOSIS — R7989 Other specified abnormal findings of blood chemistry: Secondary | ICD-10-CM

## 2020-12-10 DIAGNOSIS — M25441 Effusion, right hand: Secondary | ICD-10-CM | POA: Diagnosis present

## 2020-12-10 LAB — CBC WITH DIFFERENTIAL/PLATELET
Abs Immature Granulocytes: 0.03 10*3/uL (ref 0.00–0.07)
Basophils Absolute: 0.1 10*3/uL (ref 0.0–0.1)
Basophils Relative: 1 %
Eosinophils Absolute: 0.5 10*3/uL (ref 0.0–0.5)
Eosinophils Relative: 5 %
HCT: 35.2 % — ABNORMAL LOW (ref 36.0–46.0)
Hemoglobin: 12.4 g/dL (ref 12.0–15.0)
Immature Granulocytes: 0 %
Lymphocytes Relative: 31 %
Lymphs Abs: 3.4 10*3/uL (ref 0.7–4.0)
MCH: 32.7 pg (ref 26.0–34.0)
MCHC: 35.2 g/dL (ref 30.0–36.0)
MCV: 92.9 fL (ref 80.0–100.0)
Monocytes Absolute: 0.9 10*3/uL (ref 0.1–1.0)
Monocytes Relative: 9 %
Neutro Abs: 5.9 10*3/uL (ref 1.7–7.7)
Neutrophils Relative %: 54 %
Platelets: 349 10*3/uL (ref 150–400)
RBC: 3.79 MIL/uL — ABNORMAL LOW (ref 3.87–5.11)
RDW: 13.1 % (ref 11.5–15.5)
WBC: 10.8 10*3/uL — ABNORMAL HIGH (ref 4.0–10.5)
nRBC: 0 % (ref 0.0–0.2)

## 2020-12-10 LAB — URINALYSIS, COMPLETE (UACMP) WITH MICROSCOPIC
Bilirubin Urine: NEGATIVE
Glucose, UA: NEGATIVE mg/dL
Hgb urine dipstick: NEGATIVE
Ketones, ur: NEGATIVE mg/dL
Nitrite: NEGATIVE
Protein, ur: NEGATIVE mg/dL
Specific Gravity, Urine: 1.006 (ref 1.005–1.030)
Squamous Epithelial / HPF: NONE SEEN (ref 0–5)
pH: 7 (ref 5.0–8.0)

## 2020-12-10 LAB — RESP PANEL BY RT-PCR (FLU A&B, COVID) ARPGX2
Influenza A by PCR: NEGATIVE
Influenza B by PCR: NEGATIVE
SARS Coronavirus 2 by RT PCR: NEGATIVE

## 2020-12-10 LAB — TROPONIN I (HIGH SENSITIVITY): Troponin I (High Sensitivity): 8 ng/L (ref <18)

## 2020-12-10 NOTE — ED Triage Notes (Signed)
Pt presents via EMS after fall from standing position. Pt family reports pt standing when she passed out, falling directly back onto carpet. Pt arrives confused with baseline at A&O x 4.

## 2020-12-10 NOTE — ED Notes (Signed)
Pt transported to CT ?
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
## 2020-12-10 NOTE — ED Notes (Signed)
Pt returned from CT °

## 2020-12-10 NOTE — ED Notes (Signed)
Spoke with pt son on phone. Pt son notified that pt will be admitted to hospital.

## 2020-12-11 ENCOUNTER — Observation Stay
Admit: 2020-12-11 | Discharge: 2020-12-11 | Disposition: A | Discharge status: Home / Self Care | Payer: Medicare Other | Attending: Internal Medicine | Admitting: Internal Medicine

## 2020-12-11 ENCOUNTER — Other Ambulatory Visit: Payer: Self-pay

## 2020-12-11 ENCOUNTER — Observation Stay: Payer: Medicare Other

## 2020-12-11 ENCOUNTER — Encounter: Payer: Self-pay | Admitting: Internal Medicine

## 2020-12-11 LAB — COMPREHENSIVE METABOLIC PANEL
ALT: 12 U/L (ref 0–44)
AST: 17 U/L (ref 15–41)
Albumin: 3.8 g/dL (ref 3.5–5.0)
Alkaline Phosphatase: 85 U/L (ref 38–126)
Anion gap: 10 (ref 5–15)
BUN: 23 mg/dL (ref 8–23)
CO2: 29 mmol/L (ref 22–32)
Calcium: 9.4 mg/dL (ref 8.9–10.3)
Chloride: 96 mmol/L — ABNORMAL LOW (ref 98–111)
Creatinine, Ser: 1.36 mg/dL — ABNORMAL HIGH (ref 0.44–1.00)
GFR, Estimated: 38 mL/min — ABNORMAL LOW (ref >60)
Glucose, Bld: 181 mg/dL — ABNORMAL HIGH (ref 70–99)
Potassium: 4.1 mmol/L (ref 3.5–5.1)
Sodium: 135 mmol/L (ref 135–145)
Total Bilirubin: 0.8 mg/dL (ref 0.3–1.2)
Total Protein: 7.1 g/dL (ref 6.5–8.1)

## 2020-12-11 LAB — ECHOCARDIOGRAM COMPLETE
AR max vel: 1.99 cm2
AV Area VTI: 2.21 cm2
AV Area mean vel: 2.27 cm2
AV Mean grad: 7 mmHg
AV Peak grad: 13.5 mmHg
Ao pk vel: 1.84 m/s
Area-P 1/2: 3.11 cm2
MV VTI: 2.3 cm2
S' Lateral: 2.53 cm
Weight: 2158.74 oz

## 2020-12-11 LAB — HEMOGLOBIN A1C
Hgb A1c MFr Bld: 7.6 % — ABNORMAL HIGH (ref 4.8–5.6)
Mean Plasma Glucose: 171.42 mg/dL

## 2020-12-11 LAB — CBG MONITORING, ED
Glucose-Capillary: 133 mg/dL — ABNORMAL HIGH (ref 70–99)
Glucose-Capillary: 158 mg/dL — ABNORMAL HIGH (ref 70–99)
Glucose-Capillary: 166 mg/dL — ABNORMAL HIGH (ref 70–99)

## 2020-12-11 LAB — GLUCOSE, CAPILLARY: Glucose-Capillary: 191 mg/dL — ABNORMAL HIGH (ref 70–99)

## 2020-12-11 LAB — TROPONIN I (HIGH SENSITIVITY): Troponin I (High Sensitivity): 11 ng/L (ref <18)

## 2020-12-11 MED ORDER — INSULIN ASPART 100 UNIT/ML IJ SOLN
0.0000 [IU] | Freq: Every day | INTRAMUSCULAR | Status: DC
  Administered 2020-12-18 – 2020-12-19 (×2): 2 [IU] via SUBCUTANEOUS
  Filled 2020-12-11: qty 1

## 2020-12-11 MED ORDER — HYDRALAZINE HCL 20 MG/ML IJ SOLN
10.0000 mg | Freq: Four times a day (QID) | INTRAMUSCULAR | Status: DC | PRN
  Administered 2020-12-12: 10 mg via INTRAVENOUS
  Filled 2020-12-11 (×2): qty 1

## 2020-12-11 MED ORDER — CLOPIDOGREL BISULFATE 75 MG PO TABS
75.0000 mg | ORAL_TABLET | Freq: Every day | ORAL | Status: DC
  Administered 2020-12-12 – 2020-12-20 (×9): 75 mg via ORAL
  Filled 2020-12-11 (×10): qty 1

## 2020-12-11 MED ORDER — FLUTICASONE PROPIONATE 50 MCG/ACT NA SUSP
1.0000 | Freq: Every day | NASAL | Status: DC
  Administered 2020-12-12 – 2020-12-20 (×8): 1 via NASAL
  Filled 2020-12-11: qty 16

## 2020-12-11 MED ORDER — SODIUM CHLORIDE 0.9 % IV BOLUS
500.0000 mL | Freq: Once | INTRAVENOUS | Status: AC
  Administered 2020-12-11: 500 mL via INTRAVENOUS

## 2020-12-11 MED ORDER — ONDANSETRON HCL 4 MG PO TABS
4.0000 mg | ORAL_TABLET | Freq: Four times a day (QID) | ORAL | Status: DC | PRN
  Administered 2020-12-12: 4 mg via ORAL
  Filled 2020-12-11: qty 1

## 2020-12-11 MED ORDER — ACETAMINOPHEN 650 MG RE SUPP: 650.0000 mg | Freq: Four times a day (QID) | RECTAL | Status: DC | PRN

## 2020-12-11 MED ORDER — ONDANSETRON HCL 4 MG/2ML IJ SOLN: 4.0000 mg | Freq: Four times a day (QID) | INTRAMUSCULAR | Status: DC | PRN

## 2020-12-11 MED ORDER — ENOXAPARIN SODIUM 40 MG/0.4ML IJ SOSY: 40.0000 mg | PREFILLED_SYRINGE | INTRAMUSCULAR | Status: DC

## 2020-12-11 MED ORDER — VITAMIN B-12 100 MCG PO TABS
100.0000 ug | ORAL_TABLET | Freq: Every day | ORAL | Status: DC
  Administered 2020-12-11 – 2020-12-20 (×10): 100 ug via ORAL
  Filled 2020-12-11 (×10): qty 1

## 2020-12-11 MED ORDER — NITROGLYCERIN 0.4 MG SL SUBL: 0.4000 mg | SUBLINGUAL_TABLET | SUBLINGUAL | Status: DC | PRN

## 2020-12-11 MED ORDER — ENOXAPARIN SODIUM 30 MG/0.3ML IJ SOSY
30.0000 mg | PREFILLED_SYRINGE | INTRAMUSCULAR | Status: DC
  Administered 2020-12-11 – 2020-12-12 (×2): 30 mg via SUBCUTANEOUS
  Filled 2020-12-11 (×2): qty 0.3

## 2020-12-11 MED ORDER — ACETAMINOPHEN 325 MG PO TABS
650.0000 mg | ORAL_TABLET | Freq: Four times a day (QID) | ORAL | Status: DC | PRN
  Administered 2020-12-11 – 2020-12-20 (×10): 650 mg via ORAL
  Filled 2020-12-11 (×9): qty 2

## 2020-12-11 MED ORDER — INSULIN ASPART 100 UNIT/ML IJ SOLN
0.0000 [IU] | Freq: Three times a day (TID) | INTRAMUSCULAR | Status: DC
Start: 2020-12-11 — End: 2020-12-20
  Administered 2020-12-12: 5 [IU] via SUBCUTANEOUS
  Administered 2020-12-12 – 2020-12-13 (×3): 3 [IU] via SUBCUTANEOUS
  Administered 2020-12-13: 2 [IU] via SUBCUTANEOUS
  Administered 2020-12-14: 5 [IU] via SUBCUTANEOUS
  Administered 2020-12-15 (×2): 2 [IU] via SUBCUTANEOUS
  Administered 2020-12-15 – 2020-12-16 (×3): 3 [IU] via SUBCUTANEOUS
  Administered 2020-12-16: 5 [IU] via SUBCUTANEOUS
  Administered 2020-12-17: 2 [IU] via SUBCUTANEOUS
  Administered 2020-12-17 (×2): 3 [IU] via SUBCUTANEOUS
  Administered 2020-12-18: 2 [IU] via SUBCUTANEOUS
  Administered 2020-12-18: 5 [IU] via SUBCUTANEOUS
  Administered 2020-12-18: 3 [IU] via SUBCUTANEOUS
  Administered 2020-12-19: 5 [IU] via SUBCUTANEOUS
  Administered 2020-12-19 (×2): 3 [IU] via SUBCUTANEOUS
  Administered 2020-12-20: 5 [IU] via SUBCUTANEOUS
  Administered 2020-12-20: 3 [IU] via SUBCUTANEOUS
  Filled 2020-12-11 (×23): qty 1

## 2020-12-11 MED ORDER — TIMOLOL MALEATE 0.5 % OP SOLN
1.0000 [drp] | Freq: Every day | OPHTHALMIC | Status: DC
  Administered 2020-12-11 – 2020-12-19 (×9): 1 [drp] via OPHTHALMIC
  Filled 2020-12-11 (×2): qty 5

## 2020-12-11 MED ORDER — ROSUVASTATIN CALCIUM 10 MG PO TABS
20.0000 mg | ORAL_TABLET | Freq: Every day | ORAL | Status: DC
  Administered 2020-12-11 – 2020-12-18 (×8): 20 mg via ORAL
  Filled 2020-12-11 (×8): qty 2

## 2020-12-11 MED ORDER — SODIUM CHLORIDE 0.9% FLUSH
3.0000 mL | Freq: Two times a day (BID) | INTRAVENOUS | Status: DC
  Administered 2020-12-11 – 2020-12-20 (×20): 3 mL via INTRAVENOUS

## 2020-12-11 NOTE — H&P (Signed)
History and Physical    [name] YSA:[id number] DOB: 1930-12-26 DOA: 12/10/2020  PCP: Danella Penton, MD   Patient coming from: home  I have personally briefly reviewed patient's old medical records in [facility]  Chief Complaint: syncope  HPI: [name] is a 87 y.o. female with medical history significant for DM, HTN, CAD, CKD 3B with history of stent placement, prior admission in 2021 for syncope related to beta-blocker induced bradycardia who presents to the emergency room following a syncopal episode.  History is limited as patient has no recollection of the event.  States she last remembers sitting at the dining table about to have a meal and she remembers feeling like she was about to pass out and the next thing she remembered was being in the hospital.  Does not remember the ambulance ride clearly.  She did state that the day prior she forgot to take her morning medications and took them at 11:30 that night, and on the day of arrival instead of taking the a.m. medications in the morning a she took them at 5:30 the evening while sitting at the dining room table. She complains of headache to the back of her head but denies any other complaints.  Said she was in her usual state of health prior to the event.  She denies recent illness.  Denies recent nausea, vomiting, diarrhea or abdominal pain.  Denies recent cough, shortness of breath or chest pain and denies recent headaches, visual disturbance, one-sided numbness weakness or tingling.  Denies palpitations.    ED course: On arrival, afebrile, pulse 66, BP 158/50 with O2 sat 97% on room air Blood work with WBC 10,800, normal hemoglobin.  Creatinine 1.36, which appears to be her baseline.  Troponin of 8.  Urinalysis unremarkable.  COVID and flu negative  EKG, personally viewed and interpreted: NSR at 64 with no acute ST-T wave changes  Imaging: CT head and C-spine with no acute abnormalities  Hospitalist consulted for  admission.  Review of Systems: As per HPI otherwise all other systems on review of systems negative.    Past Medical History:  Diagnosis Date   Hypercholesteremia    Hypertension    Myocardial infarction (HCC) 2008   S/P angioplasty with stent     Past Surgical History:  Procedure Laterality Date   ABDOMINAL HYSTERECTOMY     APPENDECTOMY     CORONARY ANGIOPLASTY WITH STENT PLACEMENT     CORONARY STENT INTERVENTION N/A 12/12/2019   Procedure: CORONARY STENT INTERVENTION;  Surgeon: Marcina Millard, MD;  Location: [facility] INVASIVE CV LAB;  Service: Cardiovascular;  Laterality: N/A;   LEFT HEART CATH AND CORONARY ANGIOGRAPHY N/A 12/12/2019   Procedure: LEFT HEART CATH AND CORONARY ANGIOGRAPHY with possible PCI;  Surgeon: Marcina Millard, MD;  Location: [facility] INVASIVE CV LAB;  Service: Cardiovascular;  Laterality: N/A;     reports that she has never smoked. She has never used smokeless tobacco. She reports that she does not drink alcohol and does not use drugs.  Allergies  Allergen Reactions   Butenafine Swelling    Tongue swelled and turned red   Levofloxacin Swelling and Other (See Comments)    Other reaction(s): Other (See Comments) Tongue swelled and turned red   Limonene Other (See Comments)    Other reaction(s): Other (See Comments) Tongue swelled and turned red Tongue swelled and turned red    Penicillins     Other reaction(s): Unknown   Terbinafine And Related Other (See Comments)  Tongue swelled and turned red    Family History  Problem Relation Age of Onset   Breast cancer Maternal Aunt       Prior to Admission medications   Medication Sig Start Date End Date Taking? Authorizing Provider  acetaminophen (TYLENOL) 500 MG tablet Take 500 mg by mouth every 6 (six) hours as needed.    [provider]  amLODipine (NORVASC) 5 MG tablet Take 5 mg by mouth daily. 11/14/19   [provider]  aspirin EC 81 MG tablet Take 81 mg by mouth daily.     [provider]  cloNIDine (CATAPRES - DOSED IN MG/24 HR) 0.1 mg/24hr patch 0.1 mg once a week. 11/14/19   [provider]  clopidogrel (PLAVIX) 75 MG tablet Take 75 mg by mouth daily.    [provider]  Dextromethorphan-guaiFENesin [facility] DM PO) Take by mouth.    [provider]  donepezil (ARICEPT) 10 MG tablet Take 10 mg by mouth daily. 08/16/20   [provider]  donepezil (ARICEPT) 5 MG tablet Take 5 mg by mouth daily. 11/14/19   [provider]  fluticasone (FLONASE) 50 MCG/ACT nasal spray Place into both nostrils daily.    [provider]  gabapentin (NEURONTIN) 100 MG capsule Take 100 mg by mouth 3 (three) times daily.  02/14/15   [provider]  glimepiride (AMARYL) 1 MG tablet Take 1 mg by mouth daily with breakfast. Patient not taking: Reported on 03/08/2020    [provider]  isosorbide mononitrate (IMDUR) 60 MG 24 hr tablet Take 60 mg by mouth daily.    [provider]  magnesium oxide (MAG-OX) 400 MG tablet Take 400 mg by mouth daily.     [provider]  metoprolol tartrate (LOPRESSOR) 25 MG tablet Take 25 mg by mouth 2 (two) times daily. Patient not taking: Reported on 03/08/2020    [provider]  Multiple Vitamins-Minerals (MULTIVITAMIN GUMMIES ADULT PO) Take by mouth.    [provider]  nitroGLYCERIN (NITROSTAT) 0.4 MG SL tablet Place 0.4 mg under the tongue every 5 (five) minutes as needed. For chest pain    [provider]  omeprazole (PRILOSEC) 20 MG capsule Take 20 mg by mouth daily.  11/22/18   [provider]  polyethylene glycol (MIRALAX / GLYCOLAX) 17 g packet Take 17 g by mouth daily as needed. Patient not taking: Reported on 03/08/2020    [provider]  rosuvastatin (CRESTOR) 20 MG tablet Take 20 mg by mouth daily.    [provider]  spironolactone (ALDACTONE) 25 MG tablet Take 25 mg by mouth daily. 01/02/20    [provider]  timolol (TIMOPTIC) 0.5 % ophthalmic solution Place 1 drop into both eyes at bedtime. 06/26/19   [provider]  tiZANidine (ZANAFLEX) 2 MG tablet Take 2 mg by mouth 3 (three) times daily.  Patient not taking: Reported on 03/08/2020    [provider]  torsemide (DEMADEX) 20 MG tablet Take 30 mg by mouth every evening.     [provider]  triamcinolone cream (KENALOG) 0.1 % Apply 1 application topically See admin instructions. Apply topically 2 to 3 times a day. 11/28/20   [provider]    Physical Exam: Vitals:   12/10/20 2200 12/10/20 2230 12/10/20 2300 12/10/20 2320  BP: (!) 134/47 (!) 146/49 (!) 158/50 (!) 158/50  Pulse: 62 60 66 66  Resp: 18   18  Temp:      TempSrc:  SpO2: 97% 97% 100% 97%     Vitals:   12/10/20 2200 12/10/20 2230 12/10/20 2300 12/10/20 2320  BP: (!) 134/47 (!) 146/49 (!) 158/50 (!) 158/50  Pulse: 62 60 66 66  Resp: 18   18  Temp:      TempSrc:      SpO2: 97% 97% 100% 97%      Constitutional: Alert and oriented x 3 . Not in any apparent distress HEENT:      Head: Normocephalic and atraumatic.         Eyes: PERLA, EOMI, Conjunctivae are normal. Sclera is non-icteric.       Mouth/Throat: Mucous membranes are moist.       Neck: Supple with no signs of meningismus. Cardiovascular: Regular rate and rhythm. No murmurs, gallops, or rubs. 2+ symmetrical distal pulses are present . No JVD. No LE edema Respiratory: Respiratory effort normal .Lungs sounds clear bilaterally. No wheezes, crackles, or rhonchi.  Gastrointestinal: Soft, non tender, and non distended with positive bowel sounds.  Genitourinary: No CVA tenderness. Musculoskeletal: Nontender with normal range of motion in all extremities. No cyanosis, or erythema of extremities. Neurologic:  Face is symmetric. Moving all extremities. No gross focal neurologic deficits . Skin: Skin is warm, dry.  No rash or ulcers Psychiatric: Mood and  affect are normal    Labs on Admission: I have personally reviewed following labs and imaging studies  CBC: Recent Labs  Lab 12/10/20 2040  WBC 10.8*  NEUTROABS 5.9  HGB 12.4  HCT 35.2*  MCV 92.9  PLT 349   Basic Metabolic Panel: Recent Labs  Lab 12/10/20 2338  NA 135  K 4.1  CL 96*  CO2 29  GLUCOSE 181*  BUN 23  CREATININE 1.36*  CALCIUM 9.4   GFR: CrCl cannot be calculated (Unknown ideal weight.). Liver Function Tests: Recent Labs  Lab 12/10/20 2338  AST 17  ALT 12  ALKPHOS 85  BILITOT 0.8  PROT 7.1  ALBUMIN 3.8   No results for input(s): LIPASE, AMYLASE in the last 168 hours. No results for input(s): AMMONIA in the last 168 hours. Coagulation Profile: No results for input(s): INR, PROTIME in the last 168 hours. Cardiac Enzymes: No results for input(s): CKTOTAL, CKMB, CKMBINDEX, TROPONINI in the last 168 hours. BNP (last 3 results) No results for input(s): PROBNP in the last 8760 hours. HbA1C: No results for input(s): HGBA1C in the last 72 hours. CBG: No results for input(s): GLUCAP in the last 168 hours. Lipid Profile: No results for input(s): CHOL, HDL, LDLCALC, TRIG, CHOLHDL, LDLDIRECT in the last 72 hours. Thyroid Function Tests: No results for input(s): TSH, T4TOTAL, FREET4, T3FREE, THYROIDAB in the last 72 hours. Anemia Panel: No results for input(s): VITAMINB12, FOLATE, FERRITIN, TIBC, IRON, RETICCTPCT in the last 72 hours. Urine analysis:    Component Value Date/Time   COLORURINE STRAW (A) 12/10/2020 2151   APPEARANCEUR CLEAR (A) 12/10/2020 2151   APPEARANCEUR CLEAR 08/15/2012 1031   LABSPEC 1.006 12/10/2020 2151   LABSPEC 1.015 08/15/2012 1031   PHURINE 7.0 12/10/2020 2151   GLUCOSEU NEGATIVE 12/10/2020 2151   GLUCOSEU 300 mg/dL 15/83/0940 7680   HGBUR NEGATIVE 12/10/2020 2151   BILIRUBINUR NEGATIVE 12/10/2020 2151   BILIRUBINUR NEGATIVE 08/15/2012 1031   KETONESUR NEGATIVE 12/10/2020 2151   PROTEINUR NEGATIVE 12/10/2020 2151    NITRITE NEGATIVE 12/10/2020 2151   LEUKOCYTESUR SMALL (A) 12/10/2020 2151   LEUKOCYTESUR NEGATIVE 08/15/2012 1031    Radiological Exams on Admission: CT HEAD WO CONTRAST ( )  Result Date: 12/10/2020 CLINICAL DATA:  Syncope, fall EXAM: CT HEAD WITHOUT CONTRAST TECHNIQUE: Contiguous axial images were obtained from the base of the skull through the vertex without intravenous contrast. COMPARISON:  08/26/2020 FINDINGS: Brain: There is atrophy and chronic small vessel disease changes. No acute intracranial abnormality. Specifically, no hemorrhage, hydrocephalus, mass lesion, acute infarction, or significant intracranial injury. Vascular: No hyperdense vessel or unexpected calcification. Skull: No acute calvarial abnormality. Sinuses/Orbits: Visualized paranasal sinuses and mastoids clear. Orbital soft tissues unremarkable. Other: None IMPRESSION: Atrophy, chronic microvascular disease. No acute intracranial abnormality. Electronically Signed   By: Charlett Nose M.D.   On: 12/10/2020 21:39   CT Cervical Spine Wo Contrast  Result Date: 12/10/2020 CLINICAL DATA:  Neck trauma (Age >= 65y) Syncope, fall from standing position. EXAM: CT CERVICAL SPINE WITHOUT CONTRAST TECHNIQUE: Multidetector CT imaging of the cervical spine was performed without intravenous contrast. Multiplanar CT image reconstructions were also generated. COMPARISON:  CT cervical spine 08/26/2020 FINDINGS: Alignment: Stable from prior exam. No traumatic subluxation. Similar grade 1 anterolisthesis of C2 on C3 and C4 on C5. Skull base and vertebrae: No acute fracture. The dens and skull base are intact. Soft tissues and spinal canal: No prevertebral fluid or swelling. No visible canal hematoma. Disc levels: Diffuse degenerative disc disease and facet hypertrophy. Stable degenerative change from prior. C1-C2 degenerative change with mild pannus. Upper chest: No acute findings. Other: Carotid calcifications. IMPRESSION: 1. No acute fracture or  subluxation of the cervical spine. 2. Stable degenerative disc disease and facet hypertrophy. Electronically Signed   By: Narda Rutherford M.D.   On: 12/10/2020 21:53     Assessment/Plan 87 year old female with history of DM, HTN, CAD, CKD 3B with history of stent placement, prior admission for syncope related to beta-blocker induced bradycardia who presents to the emergency room following a syncopal episode.     Syncope and collapse - Patient with a syncopal event with no recollection - History of prior syncopal event related to medication - Patient did report taking medications differently/less than 24 hours apart  - Possibly medication related.  On multiple antihypertensives including amlodipine, clonidine, Imdur, metoprolol, spironolactone and torsemide - Syncope work-up to include cardiac monitoring, echocardiogram and carotid Doppler - Neurologic checks and fall precautions - Gentle IV fluid bolus given soft diastolic blood pressure 50    Carotid stenosis, history of - Carotid Doppler - TIA not suspected but not ruled out - Work-up as above    Essential hypertension - Due to soft diastolic blood pressure we will hold antihypertensives tonight - Patient on multiple antihypertensives including amlodipine, clonidine, Imdur, metoprolol, spironolactone and torsemide    CAD (coronary artery disease) - Denies chest pain, EKG nonacute and troponin negative    Type 2 diabetes mellitus (HCC) - Sliding scale insulin coverage    DVT prophylaxis: Lovenox  Code Status: full code  Family Communication:  none  Disposition Plan: Back to previous home environment Consults called: none  Status: Patient    Andris Baumann MD [facility]     12/11/2020, 12:26 AM

## 2020-12-11 NOTE — ED Notes (Signed)
Breakfast tray provided. 

## 2020-12-11 NOTE — Plan of Care (Signed)

## 2020-12-11 NOTE — ED Notes (Signed)
RN to bedside to introduce self to pt. Pt resting.  ?

## 2020-12-11 NOTE — ED Notes (Signed)
Crystal RN aware of assigned bed 

## 2020-12-11 NOTE — ED Notes (Signed)
Echo @ the bedside

## 2020-12-11 NOTE — Progress Notes (Signed)
PROGRESS NOTE    [name]  BPZ:[id number] DOB: May 29, 1930 DOA: 12/10/2020 PCP: Danella Penton, MD   Assessment & Plan:   Active Problems:   Carotid stenosis   Essential hypertension   CAD (coronary artery disease)   Type 2 diabetes mellitus (HCC)   Syncope and collapse   Syncope: etiology unclear. W/ hx of prior syncopal event related to medication & has hx of carotid stenosis. Takes multiple anti-HTN meds including amlodipine, clonidine, imdur, metoprolol, aldactone, torsemide. Echo shows EF 55-60%, normal diastolic function & no regional wall motion abnormalities    Hx of b/l carotid stenosis: carotid US shows b/l carotid stenosis. Present at least since 2014  HTN: as BP on low end of normal will hold all home anti-HTN    Hx of CAD: no chest pain. Will hold home dose of metoprolol, aldactone.   DM2: poorly controlled w/ HbA1c 7.6. Continue on SSI w/ accuchecks   Likely CKD: baseline Cr/ GFR is unknown, currently stage IIIb. Avoid nephrotoxic meds    DVT prophylaxis: lovenox  Code Status: full  Family Communication: called pt's son, [name], no answered so I left a voicemail  Disposition Plan: depends on PT/OT recs  Level of care: Progressive Cardiac  Status is: Observation  The patient remains OBS appropriate and will d/c before 2 midnights.  Dispo: The patient is from: Home              Anticipated d/c is to: Home vs SNF              Patient currently is not medically stable to d/c.   Difficult to place patient : [place]    Consultants:    Procedures:   Antimicrobials:  Subjective: Pt c/o pain over entire body   Objective: Vitals:   12/11/20 1200 12/11/20 1300 12/11/20 1430 12/11/20 1500  BP: (!) 170/61 (!) 176/52 (!) 176/54 (!) 137/44  Pulse: 75 77 91 79  Resp:  16 16   Temp:   98.5 F (36.9 C)   TempSrc:      SpO2: 98% 99% 100% 98%  Weight:        Intake/Output Summary (Last 24 hours) at 12/11/2020 1610 Last data filed at 12/11/2020  2423 Gross per 24 hour  Intake 595.83 ml  Output 1300 ml  Net -704.17 ml   Filed Weights   12/11/20 0431  Weight: 61.2 kg    Examination:  General exam: Appears calm and comfortable  Respiratory system: Clear to auscultation. Respiratory effort normal. Cardiovascular system: S1 & S2 +. No  rubs, gallops or clicks.  Gastrointestinal system: Abdomen is nondistended, soft and nontender. Normal bowel sounds heard. Central nervous system: Alert and oriented. Moves all extremities  Psychiatry: Judgement and insight appear normal. Mood & affect appropriate.     Data Reviewed: I have personally reviewed following labs and imaging studies  CBC: Recent Labs  Lab 12/10/20 2040  WBC 10.8*  NEUTROABS 5.9  HGB 12.4  HCT 35.2*  MCV 92.9  PLT 349   Basic Metabolic Panel: Recent Labs  Lab 12/10/20 2338  NA 135  K 4.1  CL 96*  CO2 29  GLUCOSE 181*  BUN 23  CREATININE 1.36*  CALCIUM 9.4   GFR: Estimated Creatinine Clearance: 27.3 mL/min (A) (by C-G formula based on SCr of 1.36 mg/dL (H)). Liver Function Tests: Recent Labs  Lab 12/10/20 2338  AST 17  ALT 12  ALKPHOS 85  BILITOT 0.8  PROT 7.1  ALBUMIN 3.8  No results for input(s): LIPASE, AMYLASE in the last 168 hours. No results for input(s): AMMONIA in the last 168 hours. Coagulation Profile: No results for input(s): INR, PROTIME in the last 168 hours. Cardiac Enzymes: No results for input(s): CKTOTAL, CKMB, CKMBINDEX, TROPONINI in the last 168 hours. BNP (last 3 results) No results for input(s): PROBNP in the last 8760 hours. HbA1C: Recent Labs    12/10/20 2040  HGBA1C 7.6*   CBG: Recent Labs  Lab 12/11/20 0050 12/11/20 0738 12/11/20 1356  GLUCAP 166* 133* 158*   Lipid Profile: No results for input(s): CHOL, HDL, LDLCALC, TRIG, CHOLHDL, LDLDIRECT in the last 72 hours. Thyroid Function Tests: No results for input(s): TSH, T4TOTAL, FREET4, T3FREE, THYROIDAB in the last 72 hours. Anemia Panel: No  results for input(s): VITAMINB12, FOLATE, FERRITIN, TIBC, IRON, RETICCTPCT in the last 72 hours. Sepsis Labs: No results for input(s): PROCALCITON, LATICACIDVEN in the last 168 hours.  Recent Results (from the past 240 hour(s))  Resp Panel by RT-PCR (Flu A&B, Covid) Nasopharyngeal Swab     Status: None   Collection Time: 12/10/20  9:46 PM   Specimen: Nasopharyngeal Swab; Nasopharyngeal(NP) swabs in vial transport medium  Result Value Ref Range Status   SARS Coronavirus 2 by RT PCR NEGATIVE NEGATIVE Final    Comment: (NOTE) SARS-CoV-2 target nucleic acids are NOT DETECTED.  The SARS-CoV-2 RNA is generally detectable in upper respiratory specimens during the acute phase of infection. The lowest concentration of SARS-CoV-2 viral copies this assay can detect is 138 copies/mL. A negative result does not preclude SARS-Cov-2 infection and should not be used as the sole basis for treatment or other patient management decisions. A negative result may occur with  improper specimen collection/handling, submission of specimen other than nasopharyngeal swab, presence of viral mutation(s) within the areas targeted by this assay, and inadequate number of viral copies(<138 copies/mL). A negative result must be combined with clinical observations, patient history, and epidemiological information. The expected result is Negative.  Fact Sheet for Patients:  BloggerCourse.com  Fact Sheet for Healthcare Providers:  SeriousBroker.it  This test is no t yet approved or cleared by the [place] FDA and  has been authorized for detection and/or diagnosis of SARS-CoV-2 by FDA under an Emergency Use Authorization (EUA). This EUA will remain  in effect (meaning this test can be used) for the duration of the COVID-19 declaration under Section 564(b)(1) of the Act, 21 U.S.C.section 360bbb-3(b)(1), unless the authorization is terminated  or revoked sooner.        Influenza A by PCR NEGATIVE NEGATIVE Final   Influenza B by PCR NEGATIVE NEGATIVE Final    Comment: (NOTE) The Xpert Xpress SARS-CoV-2/FLU/RSV plus assay is intended as an aid in the diagnosis of influenza from Nasopharyngeal swab specimens and should not be used as a sole basis for treatment. Nasal washings and aspirates are unacceptable for Xpert Xpress SARS-CoV-2/FLU/RSV testing.  Fact Sheet for Patients: BloggerCourse.com  Fact Sheet for Healthcare Providers: SeriousBroker.it  This test is not yet approved or cleared by the [place] FDA and has been authorized for detection and/or diagnosis of SARS-CoV-2 by FDA under an Emergency Use Authorization (EUA). This EUA will remain in effect (meaning this test can be used) for the duration of the COVID-19 declaration under Section 564(b)(1) of the Act, 21 U.S.C. section 360bbb-3(b)(1), unless the authorization is terminated or revoked.  Performed at [facility], [address] [place], [place] [address]  Radiology Studies: CT HEAD WO CONTRAST (5MM)  Result Date: 12/10/2020 CLINICAL DATA:  Syncope, fall EXAM: CT HEAD WITHOUT CONTRAST TECHNIQUE: Contiguous axial images were obtained from the base of the skull through the vertex without intravenous contrast. COMPARISON:  08/26/2020 FINDINGS: Brain: There is atrophy and chronic small vessel disease changes. No acute intracranial abnormality. Specifically, no hemorrhage, hydrocephalus, mass lesion, acute infarction, or significant intracranial injury. Vascular: No hyperdense vessel or unexpected calcification. Skull: No acute calvarial abnormality. Sinuses/Orbits: Visualized paranasal sinuses and mastoids clear. Orbital soft tissues unremarkable. Other: None IMPRESSION: Atrophy, chronic microvascular disease. No acute intracranial abnormality. Electronically Signed   By: Charlett NoseKevin  Dover M.D.   On: 12/10/2020  21:39   CT Cervical Spine Wo Contrast  Result Date: 12/10/2020 CLINICAL DATA:  Neck trauma (Age >= 65y) Syncope, fall from standing position. EXAM: CT CERVICAL SPINE WITHOUT CONTRAST TECHNIQUE: Multidetector CT imaging of the cervical spine was performed without intravenous contrast. Multiplanar CT image reconstructions were also generated. COMPARISON:  CT cervical spine 08/26/2020 FINDINGS: Alignment: Stable from prior exam. No traumatic subluxation. Similar grade 1 anterolisthesis of C2 on C3 and C4 on C5. Skull base and vertebrae: No acute fracture. The dens and skull base are intact. Soft tissues and spinal canal: No prevertebral fluid or swelling. No visible canal hematoma. Disc levels: Diffuse degenerative disc disease and facet hypertrophy. Stable degenerative change from prior. C1-C2 degenerative change with mild pannus. Upper chest: No acute findings. Other: Carotid calcifications. IMPRESSION: 1. No acute fracture or subluxation of the cervical spine. 2. Stable degenerative disc disease and facet hypertrophy. Electronically Signed   By: Narda RutherfordMelanie  Sanford M.D.   On: 12/10/2020 21:53   US Carotid Bilateral  Result Date: 12/11/2020 CLINICAL DATA:  Initial evaluation for syncope. EXAM: BILATERAL CAROTID DUPLEX ULTRASOUND TECHNIQUE: Wallace CullensGray scale imaging, color Doppler and duplex ultrasound were performed of bilateral carotid and vertebral arteries in the neck. COMPARISON:  Prior CTA from 09/06/2012. FINDINGS: Criteria: Quantification of carotid stenosis is based on velocity parameters that correlate the residual internal carotid diameter with NASCET-based stenosis levels, using the diameter of the distal internal carotid lumen as the denominator for stenosis measurement. The following velocity measurements were obtained: RIGHT ICA: 200/26 cm/sec CCA: 118/15 cm/sec SYSTOLIC ICA/CCA RATIO:  2.1 ECA: 182 cm/sec LEFT ICA: 131/24 cm/sec CCA: 139/13 cm/sec SYSTOLIC ICA/CCA RATIO:  1.4 ECA: 282 cm/sec RIGHT  CAROTID ARTERY: Scattered intimal thickening seen within the visualized right CCA without significant stenosis. Moderate heterogeneous echogenic plaque seen about the right carotid bulb, extending into the proximal right ICA. Elevation in peak systolic velocity up to 200 cm/sec, with systolic ICA/CCA ratio of 2.1, consistent with a 50-69% stenosis. Visualized right ICA irregular but patent distally. Severe stenosis at the origin of the right external carotid artery noted as well. RIGHT VERTEBRAL ARTERY:  Antegrade. LEFT CAROTID ARTERY: Smooth intimal thickening seen within the visualized left CCA without significant stenosis. Heterogeneous echogenic calcified plaque about the left carotid bulb extending into the proximal left ICA. Elevation in peak systolic velocity up to 131 cm/sec, consistent with a 50-69% stenosis. Partially visualized left ICA patent distally. Severe stenosis at the origin of the left external carotid artery noted as well. LEFT VERTEBRAL ARTERY:  Antegrade. IMPRESSION: 1. Heavy calcified plaque about the carotid bulbs/proximal ICAs, with estimated stenoses of up to 50-69% bilaterally, right worse than left. Correlation with dedicated CTA could be performed for further evaluation as warranted. 2. Severe stenoses at the origins of both external carotid arteries. 3. Patent antegrade flow  within the vertebral arteries bilaterally. Electronically Signed   By: Rise Mu M.D.   On: 12/11/2020 04:31   ECHOCARDIOGRAM COMPLETE  Result Date: 12/11/2020    ECHOCARDIOGRAM REPORT   Patient Name:   [name] Date of Exam: 12/11/2020 Medical Rec #:  [id number]     Height:       66.0 in Accession #:    [id number]    Weight:       134.9 lb Date of Birth:  1931-03-14     BSA:          1.692 m Patient Age:    87 years      BP:           157/62 mmHg Patient Gender: F             HR:           79 bpm. Exam Location:  [facility] Procedure: 2D Echo, Color Doppler and Cardiac Doppler Indications:     R55  Syncope  History:         Patient has prior history of Echocardiogram examinations, most                  recent 11/20/2019. CAD, CKD; Risk Factors:Hypertension, HCL and                  Diabetes.  Sonographer:     Humphrey Rolls Referring Phys:  [id number] Andris Baumann Diagnosing Phys: Alwyn Pea MD IMPRESSIONS  1. Normal LVF.  2. Normal Wall Motion.  3. Left ventricular ejection fraction, by estimation, is 55 to 60%. The left ventricle has normal function. The left ventricle has no regional wall motion abnormalities. Left ventricular diastolic parameters were normal.  4. Right ventricular systolic function is normal. The right ventricular size is normal.  5. The mitral valve is grossly normal. No evidence of mitral valve regurgitation.  6. The aortic valve is normal in structure. Aortic valve regurgitation is not visualized. FINDINGS  Left Ventricle: Left ventricular ejection fraction, by estimation, is 55 to 60%. The left ventricle has normal function. The left ventricle has no regional wall motion abnormalities. The left ventricular internal cavity size was normal in size. There is  no left ventricular hypertrophy. Left ventricular diastolic parameters were normal. Right Ventricle: The right ventricular size is normal. No increase in right ventricular wall thickness. Right ventricular systolic function is normal. Left Atrium: Left atrial size was normal in size. Right Atrium: Right atrial size was normal in size. Pericardium: There is no evidence of pericardial effusion. Mitral Valve: The mitral valve is grossly normal. No evidence of mitral valve regurgitation. MV peak gradient, 10.1 mmHg. The mean mitral valve gradient is 4.0 mmHg. Tricuspid Valve: The tricuspid valve is normal in structure. Tricuspid valve regurgitation is not demonstrated. Aortic Valve: The aortic valve is normal in structure. Aortic valve regurgitation is not visualized. Aortic valve mean gradient measures 7.0 mmHg. Aortic valve peak  gradient measures 13.5 mmHg. Aortic valve area, by VTI measures 2.21 cm. Pulmonic Valve: The pulmonic valve was normal in structure. Pulmonic valve regurgitation is not visualized. Aorta: The ascending aorta was not well visualized. IAS/Shunts: No atrial level shunt detected by color flow Doppler. Additional Comments: Normal LVF. Normal Wall Motion.  LEFT VENTRICLE PLAX 2D LVIDd:         3.68 cm  Diastology LVIDs:         2.53 cm  LV e' medial:    5.55 cm/s  LV PW:         0.90 cm  LV E/e' medial:  16.5 LV IVS:        0.93 cm  LV e' lateral:   5.55 cm/s LVOT diam:     1.80 cm  LV E/e' lateral: 16.5 LV SV:         79 LV SV Index:   47 LVOT Area:     2.54 cm  RIGHT VENTRICLE RV Basal diam:  2.45 cm LEFT ATRIUM           Index       RIGHT ATRIUM          Index LA diam:      3.20 cm 1.89 cm/m  RA Area:     9.76 cm LA Vol (A4C): 55.8 ml 32.98 ml/m RA Volume:   18.40 ml 10.88 ml/m  AORTIC VALVE                    PULMONIC VALVE AV Area (Vmax):    1.99 cm     PV Vmax:          1.05 m/s AV Area (Vmean):   2.27 cm     PV Vmean:         69.400 cm/s AV Area (VTI):     2.21 cm     PV VTI:           0.172 m AV Vmax:           184.00 cm/s  PV Peak grad:     4.4 mmHg AV Vmean:          120.000 cm/s PV Mean grad:     2.0 mmHg AV VTI:            0.360 m      PR End Diast Vel: 4.49 msec AV Peak Grad:      13.5 mmHg AV Mean Grad:      7.0 mmHg LVOT Vmax:         144.00 cm/s LVOT Vmean:        107.000 cm/s LVOT VTI:          0.312 m LVOT/AV VTI ratio: 0.87  AORTA Ao Root diam: 2.80 cm MITRAL VALVE MV Area (PHT): 3.11 cm     SHUNTS MV Area VTI:   2.30 cm     Systemic VTI:  0.31 m MV Peak grad:  10.1 mmHg    Systemic Diam: 1.80 cm MV Mean grad:  4.0 mmHg MV Vmax:       1.59 m/s MV Vmean:      90.1 cm/s MV Decel Time: 244 msec MV E velocity: 91.30 cm/s MV A velocity: 145.00 cm/s MV E/A ratio:  0.63 Dwayne D Callwood MD Electronically signed by Alwyn Pea MD Signature Date/Time: 12/11/2020/2:20:11 PM    Final          Scheduled Meds:  enoxaparin (LOVENOX) injection  30 mg Subcutaneous Q24H   insulin aspart  0-15 Units Subcutaneous TID WC   insulin aspart  0-5 Units Subcutaneous QHS   sodium chloride flush  3 mL Intravenous Q12H   Continuous Infusions:   LOS: 0 days    Time spent: 34 mins     Charise Killian, MD [facility] Pager [phone]  If 7PM-7AM, please contact night-coverage 12/11/2020, 4:10 PM

## 2020-12-11 NOTE — Progress Notes (Signed)
PHARMACIST - PHYSICIAN COMMUNICATION  CONCERNING:  Enoxaparin (Lovenox) for DVT Prophylaxis    RECOMMENDATION: Patient was prescribed enoxaprin 40mg  q24 hours for VTE prophylaxis.   Filed Weights   12/11/20 0431  Weight: 61.2 kg (134 lb 14.7 oz)    Body mass index is 21.78 kg/m.  Estimated Creatinine Clearance: 27.3 mL/min (A) (by C-G formula based on SCr of 1.36 mg/dL (H)).   Patient is candidate for enoxaparin 30mg  every 24 hours based on CrCl <10ml/min or Weight <45kg  DESCRIPTION: Pharmacy has adjusted enoxaparin dose per [facility] policy.  Patient is now receiving enoxaparin 30 mg every 24 hours   31m, PharmD, [facility] 12/11/2020 4:49 AM

## 2020-12-11 NOTE — Progress Notes (Signed)
*  PRELIMINARY RESULTS* Echocardiogram 2D Echocardiogram has been performed.  [name] 12/11/2020, 12:49 PM

## 2020-12-12 DIAGNOSIS — I10 Essential (primary) hypertension: Secondary | ICD-10-CM | POA: Diagnosis not present

## 2020-12-12 DIAGNOSIS — R55 Syncope and collapse: Secondary | ICD-10-CM | POA: Diagnosis not present

## 2020-12-12 DIAGNOSIS — E1159 Type 2 diabetes mellitus with other circulatory complications: Secondary | ICD-10-CM | POA: Diagnosis not present

## 2020-12-12 LAB — CBC
HCT: 38.9 % (ref 36.0–46.0)
Hemoglobin: 13.5 g/dL (ref 12.0–15.0)
MCH: 33 pg (ref 26.0–34.0)
MCHC: 34.7 g/dL (ref 30.0–36.0)
MCV: 95.1 fL (ref 80.0–100.0)
Platelets: 392 10*3/uL (ref 150–400)
RBC: 4.09 MIL/uL (ref 3.87–5.11)
RDW: 13 % (ref 11.5–15.5)
WBC: 13.3 10*3/uL — ABNORMAL HIGH (ref 4.0–10.5)
nRBC: 0 % (ref 0.0–0.2)

## 2020-12-12 LAB — GLUCOSE, CAPILLARY
Glucose-Capillary: 178 mg/dL — ABNORMAL HIGH (ref 70–99)
Glucose-Capillary: 220 mg/dL — ABNORMAL HIGH (ref 70–99)
Glucose-Capillary: 184 mg/dL — ABNORMAL HIGH (ref 70–99)

## 2020-12-12 LAB — BASIC METABOLIC PANEL
Anion gap: 9 (ref 5–15)
BUN: 20 mg/dL (ref 8–23)
CO2: 24 mmol/L (ref 22–32)
Calcium: 9.7 mg/dL (ref 8.9–10.3)
Chloride: 102 mmol/L (ref 98–111)
Creatinine, Ser: 1.09 mg/dL — ABNORMAL HIGH (ref 0.44–1.00)
GFR, Estimated: 49 mL/min — ABNORMAL LOW (ref >60)
Glucose, Bld: 140 mg/dL — ABNORMAL HIGH (ref 70–99)
Potassium: 4.3 mmol/L (ref 3.5–5.1)
Sodium: 135 mmol/L (ref 135–145)

## 2020-12-12 MED ORDER — OXYCODONE-ACETAMINOPHEN 5-325 MG PO TABS
1.0000 | ORAL_TABLET | Freq: Four times a day (QID) | ORAL | Status: DC | PRN
Start: 2020-12-12 — End: 2020-12-14
  Administered 2020-12-13 – 2020-12-14 (×3): 1 via ORAL
  Filled 2020-12-12 (×3): qty 1

## 2020-12-12 MED ORDER — ENOXAPARIN SODIUM 40 MG/0.4ML IJ SOSY
40.0000 mg | PREFILLED_SYRINGE | INTRAMUSCULAR | Status: DC
  Administered 2020-12-13 – 2020-12-16 (×4): 40 mg via SUBCUTANEOUS
  Filled 2020-12-12 (×4): qty 0.4

## 2020-12-12 MED ORDER — AMLODIPINE BESYLATE 5 MG PO TABS
5.0000 mg | ORAL_TABLET | Freq: Every day | ORAL | Status: DC
  Administered 2020-12-12 – 2020-12-19 (×8): 5 mg via ORAL
  Filled 2020-12-12 (×8): qty 1

## 2020-12-12 MED ORDER — CYCLOBENZAPRINE HCL 10 MG PO TABS
5.0000 mg | ORAL_TABLET | Freq: Two times a day (BID) | ORAL | Status: DC | PRN
  Administered 2020-12-14: 5 mg via ORAL
  Filled 2020-12-12: qty 1

## 2020-12-12 MED ORDER — DOCUSATE SODIUM 100 MG PO CAPS
200.0000 mg | ORAL_CAPSULE | Freq: Two times a day (BID) | ORAL | Status: DC
  Administered 2020-12-12 – 2020-12-20 (×14): 200 mg via ORAL
  Filled 2020-12-12 (×15): qty 2

## 2020-12-12 NOTE — Progress Notes (Signed)
PT Cancellation Note  Patient Details Name: [name] MRN: [id number] DOB: [birth year]   Cancelled Treatment:    Reason Eval/Treat Not Completed: Other (comment) Spoke with OT who had recently evaluated the pt.  OT states that pt will not be up for working with PT today as she was unable to tolerate sitting, c/o pain and dizziness and was hesitant to do much at all today.  OT states that she told pt to expect PT tomorrow morning, which will be the plan.  Malachi Pro, DPT 12/12/2020, 3:17 PM

## 2020-12-12 NOTE — Progress Notes (Signed)
Clinical/Bedside Swallow Evaluation Patient Details  Name: [name] MRN: [id number] Date of Birth: 11-14-1930  Today's Date: 12/12/2020 Time: SLP Start Time (ACUTE ONLY): 1400 SLP Stop Time (ACUTE ONLY): 1415 SLP Time Calculation (min) (ACUTE ONLY): 15 min  Past Medical History:  Past Medical History:  Diagnosis Date   Hypercholesteremia    Hypertension    Myocardial infarction (HCC) 2008   S/P angioplasty with stent    Past Surgical History:  Past Surgical History:  Procedure Laterality Date   ABDOMINAL HYSTERECTOMY     APPENDECTOMY     CORONARY ANGIOPLASTY WITH STENT PLACEMENT     CORONARY STENT INTERVENTION N/A 12/12/2019   Procedure: CORONARY STENT INTERVENTION;  Surgeon: Marcina Millard, MD;  Location: [facility] INVASIVE CV LAB;  Service: Cardiovascular;  Laterality: N/A;   LEFT HEART CATH AND CORONARY ANGIOGRAPHY N/A 12/12/2019   Procedure: LEFT HEART CATH AND CORONARY ANGIOGRAPHY with possible PCI;  Surgeon: Marcina Millard, MD;  Location: [facility] INVASIVE CV LAB;  Service: Cardiovascular;  Laterality: N/A;   HPI:  Patient is an 87 year old female with past medical history noted for DM, HTN, CAD, CKD 3B with history of stent placement, and prior admission in 2021 for syncope.  Patient presented with syncope on 12/10/2020 and last recalled sitting down at her dining table about to have a meal.  CT head on 12/10/2020 showed no acute findings.Most recent chest x-ray on 08/26/2020 showed no active cardiopulmonary disease.  Referred for swallowing evaluation.   Assessment / Plan / Recommendation Clinical Impression  Patient appears to be at minimal risk for aspiration when using general aspiration precautions.  Per RN, night nurse noted coughing with medication administration.  Bedside swallow assessment completed including thin liquids, pure, regular solids, as well as observation of RN med pass.  Oral motor examination is unremarkable.  Patient is able to follow commands and  engage in simple conversation.  She is able to self-feed without difficulty.  Oral stage is characterized by adequate bolus containment, rotary mastication, and appearance of timely swallow initiation.  Patient took medications with liquid with no overt signs of aspiration, and consumed liquids by cup, pure, and regular solids without difficulty.  Subtle cough x1 of 5 trials with consecutive swallows of liquid via straw.  No overt signs of aspiration when taking single or multiple cup sips.  Patient denies history of swallowing difficulties, and none are reported in medical history.  Recommend she continue regular diet with thin liquids, NO STRAWS and general aspiration precautions including upright posture for all p.o. No further skilled ST needs are identified at this time.   SLP Visit Diagnosis: Dysphagia, unspecified (R13.10)    Aspiration Risk  Other (comment) (minimal when following general aspiration precaution)    Diet Recommendation Regular;Thin liquid   Liquid Administration via: Spoon;No straw Medication Administration: Whole meds with liquid (can give whole in puree if pt has difficulty) Supervision: Patient able to self feed Compensations: Slow rate;Small sips/bites Postural Changes: Seated upright at 90 degrees    Other  Recommendations Oral Care Recommendations: Oral care BID   Follow up Recommendations    None for ST    Frequency and Duration   Evaluation only         Prognosis   Good     Swallow Study   General Date of Onset: 12/10/20 HPI: Patient is an 87 year old female with past medical history noted for DM, HTN, CAD, CKD 3B with history of stent placement, and prior admission in 2021 for syncope.  Patient presented with syncope on 12/10/2020 and last recalled sitting down at her dining table about to have a meal.  CT head on 12/10/2020 showed no acute findings.Most recent chest x-ray on 08/26/2020 showed no active cardiopulmonary disease.  Referred for swallowing  evaluation. Type of Study: Bedside Swallow Evaluation Previous Swallow Assessment: none on file Diet Prior to this Study: Regular;Thin liquids Temperature Spikes Noted: No Respiratory Status: Room air History of Recent Intubation: No Behavior/Cognition: Alert;Cooperative Oral Cavity Assessment: Within Functional Limits Oral Care Completed by SLP: No Oral Cavity - Dentition: Adequate natural dentition Vision: Functional for self-feeding Self-Feeding Abilities: Able to feed self Patient Positioning: Upright in bed Baseline Vocal Quality: Normal Volitional Cough: Strong Volitional Swallow: Able to elicit    Oral/Motor/Sensory Function Overall Oral Motor/Sensory Function: Within functional limits   Ice Chips Ice chips: Not tested   Thin Liquid Thin Liquid: Within functional limits Presentation: Cup;Straw;Self Fed Other Comments: subtle cough with straw in 1/5 trials; tends to take consecutive swallows of thin vs single sips with cup    Nectar Thick Nectar Thick Liquid: Not tested   Honey Thick     Puree Puree: Within functional limits Presentation: Self Fed;Spoon   Solid     Solid: Within functional limits Presentation: Self Fed     Rondel Baton, MS, CCC-SLP Speech-Language Pathologist  Arlana Lindau 12/12/2020,2:33 PM

## 2020-12-12 NOTE — Progress Notes (Signed)
PHARMACIST - PHYSICIAN COMMUNICATION  CONCERNING:  Enoxaparin (Lovenox) for DVT Prophylaxis    RECOMMENDATION: Patient was prescribed enoxaprin 30mg  q24 hours for VTE prophylaxis.   Filed Weights   12/11/20 0431 12/12/20 0500  Weight: 61.2 kg (134 lb 14.7 oz) 63 kg (138 lb 14.2 oz)    Body mass index is 22.42 kg/m.  Estimated Creatinine Clearance: 34 mL/min (A) (by C-G formula based on SCr of 1.09 mg/dL (H)).   Patient is candidate for enoxaparin 40mg  every 24 hours based on CrCl >43ml/min or Weight >45kg  DESCRIPTION: Pharmacy has adjusted enoxaparin dose per [facility] policy.  Patient is now receiving enoxaparin 40 mg every 24 hours    31m, PharmD Clinical Pharmacist  12/12/2020 9:45 AM

## 2020-12-12 NOTE — Progress Notes (Signed)
PROGRESS NOTE    [name]  ZOX:[id number] DOB: 10-30-1930 DOA: 12/10/2020 PCP: Danella Penton, MD   Assessment & Plan:   Active Problems:   Carotid stenosis   Essential hypertension   CAD (coronary artery disease)   Type 2 diabetes mellitus (HCC)   Syncope and collapse   Syncope: etiology unclear. W/ hx of prior syncopal event related to medication & has hx of carotid stenosis. Takes multiple anti-HTN meds including amlodipine, clonidine, imdur, aldactone, torsemide. Echo shows EF 55-60%, normal diastolic function & no regional wall motion abnormalities. Will restart home dose of amlodipine today.   Hx of b/l carotid stenosis: carotid US shows b/l carotid stenosis. Present at least since 2014  HTN: will restart home dose of amlodipine    Hx of CAD: no chest pain. Continue to hold home dose of aldactone, imdur   DM2: HbA1c 7.6, poorly controlled. Continue on SSI w/ accuchecks     Likely CKD: baseline Cr/GFR is unknown, currently IIIb. Cr is trending down from day prior.   DVT prophylaxis: lovenox  Code Status: full  Family Communication: called pt's son, [name], again and no answer, I left a voicemail  Disposition Plan: depends on PT/OT recs  Level of care: Progressive Cardiac  Status is: Observation  The patient remains OBS appropriate and will d/c before 2 midnights.  Dispo: The patient is from: Home              Anticipated d/c is to: Home vs SNF              Patient currently is not medically stable to d/c.   Difficult to place patient : [place]    Consultants:    Procedures:   Antimicrobials:  Subjective: Pt c/o fatigue   Objective: Vitals:   12/12/20 0456 12/12/20 0500 12/12/20 0544 12/12/20 0810  BP: (!) 189/64  (!) 157/63 (!) 135/54  Pulse: 83  81 73  Resp: 16   20  Temp: 98.3 F (36.8 C)   98.5 F (36.9 C)  TempSrc:      SpO2: 98%   100%  Weight:  63 kg      Intake/Output Summary (Last 24 hours) at 12/12/2020 0838 Last data filed at  12/12/2020 0600 Gross per 24 hour  Intake 480 ml  Output 450 ml  Net 30 ml   Filed Weights   12/11/20 0431 12/12/20 0500  Weight: 61.2 kg 63 kg    Examination:  General exam: Appears comfortable  Respiratory system: Clear breath sounds b/l  Cardiovascular system: S1/S2+. No rubs or gallops Gastrointestinal system: Abd is soft, NT, ND & hypoactive bowel sounds  Central nervous system: Alert and awake. Moves all extremities  Psychiatry: Judgement and insight appear normal. Flat mood and affect    Data Reviewed: I have personally reviewed following labs and imaging studies  CBC: Recent Labs  Lab 12/10/20 2040 12/12/20 0457  WBC 10.8* 13.3*  NEUTROABS 5.9  --   HGB 12.4 13.5  HCT 35.2* 38.9  MCV 92.9 95.1  PLT 349 392   Basic Metabolic Panel: Recent Labs  Lab 12/10/20 2338 12/12/20 0457  NA 135 135  K 4.1 4.3  CL 96* 102  CO2 29 24  GLUCOSE 181* 140*  BUN 23 20  CREATININE 1.36* 1.09*  CALCIUM 9.4 9.7   GFR: Estimated Creatinine Clearance: 34 mL/min (A) (by C-G formula based on SCr of 1.09 mg/dL (H)). Liver Function Tests: Recent Labs  Lab 12/10/20 2338  AST  17  ALT 12  ALKPHOS 85  BILITOT 0.8  PROT 7.1  ALBUMIN 3.8   No results for input(s): LIPASE, AMYLASE in the last 168 hours. No results for input(s): AMMONIA in the last 168 hours. Coagulation Profile: No results for input(s): INR, PROTIME in the last 168 hours. Cardiac Enzymes: No results for input(s): CKTOTAL, CKMB, CKMBINDEX, TROPONINI in the last 168 hours. BNP (last 3 results) No results for input(s): PROBNP in the last 8760 hours. HbA1C: Recent Labs    12/10/20 2040  HGBA1C 7.6*   CBG: Recent Labs  Lab 12/11/20 0050 12/11/20 0738 12/11/20 1356 12/11/20 2018 12/12/20 0813  GLUCAP 166* 133* 158* 191* 178*   Lipid Profile: No results for input(s): CHOL, HDL, LDLCALC, TRIG, CHOLHDL, LDLDIRECT in the last 72 hours. Thyroid Function Tests: No results for input(s): TSH, T4TOTAL,  FREET4, T3FREE, THYROIDAB in the last 72 hours. Anemia Panel: No results for input(s): VITAMINB12, FOLATE, FERRITIN, TIBC, IRON, RETICCTPCT in the last 72 hours. Sepsis Labs: No results for input(s): PROCALCITON, LATICACIDVEN in the last 168 hours.  Recent Results (from the past 240 hour(s))  Resp Panel by RT-PCR (Flu A&B, Covid) Nasopharyngeal Swab     Status: None   Collection Time: 12/10/20  9:46 PM   Specimen: Nasopharyngeal Swab; Nasopharyngeal(NP) swabs in vial transport medium  Result Value Ref Range Status   SARS Coronavirus 2 by RT PCR NEGATIVE NEGATIVE Final    Comment: (NOTE) SARS-CoV-2 target nucleic acids are NOT DETECTED.  The SARS-CoV-2 RNA is generally detectable in upper respiratory specimens during the acute phase of infection. The lowest concentration of SARS-CoV-2 viral copies this assay can detect is 138 copies/mL. A negative result does not preclude SARS-Cov-2 infection and should not be used as the sole basis for treatment or other patient management decisions. A negative result may occur with  improper specimen collection/handling, submission of specimen other than nasopharyngeal swab, presence of viral mutation(s) within the areas targeted by this assay, and inadequate number of viral copies(<138 copies/mL). A negative result must be combined with clinical observations, patient history, and epidemiological information. The expected result is Negative.  Fact Sheet for Patients:  BloggerCourse.com  Fact Sheet for Healthcare Providers:  SeriousBroker.it  This test is no t yet approved or cleared by the [place] FDA and  has been authorized for detection and/or diagnosis of SARS-CoV-2 by FDA under an Emergency Use Authorization (EUA). This EUA will remain  in effect (meaning this test can be used) for the duration of the COVID-19 declaration under Section 564(b)(1) of the Act, 21 U.S.C.section  360bbb-3(b)(1), unless the authorization is terminated  or revoked sooner.       Influenza A by PCR NEGATIVE NEGATIVE Final   Influenza B by PCR NEGATIVE NEGATIVE Final    Comment: (NOTE) The Xpert Xpress SARS-CoV-2/FLU/RSV plus assay is intended as an aid in the diagnosis of influenza from Nasopharyngeal swab specimens and should not be used as a sole basis for treatment. Nasal washings and aspirates are unacceptable for Xpert Xpress SARS-CoV-2/FLU/RSV testing.  Fact Sheet for Patients: BloggerCourse.com  Fact Sheet for Healthcare Providers: SeriousBroker.it  This test is not yet approved or cleared by the [place] FDA and has been authorized for detection and/or diagnosis of SARS-CoV-2 by FDA under an Emergency Use Authorization (EUA). This EUA will remain in effect (meaning this test can be used) for the duration of the COVID-19 declaration under Section 564(b)(1) of the Act, 21 U.S.C. section 360bbb-3(b)(1), unless the authorization is terminated  or revoked.  Performed at [facility], [address] [place], [place] [address]          Radiology Studies: CT HEAD WO CONTRAST ( )  Result Date: 12/10/2020 CLINICAL DATA:  Syncope, fall EXAM: CT HEAD WITHOUT CONTRAST TECHNIQUE: Contiguous axial images were obtained from the base of the skull through the vertex without intravenous contrast. COMPARISON:  08/26/2020 FINDINGS: Brain: There is atrophy and chronic small vessel disease changes. No acute intracranial abnormality. Specifically, no hemorrhage, hydrocephalus, mass lesion, acute infarction, or significant intracranial injury. Vascular: No hyperdense vessel or unexpected calcification. Skull: No acute calvarial abnormality. Sinuses/Orbits: Visualized paranasal sinuses and mastoids clear. Orbital soft tissues unremarkable. Other: None IMPRESSION: Atrophy, chronic microvascular disease. No acute intracranial  abnormality. Electronically Signed   By: Charlett Nose M.D.   On: 12/10/2020 21:39   CT Cervical Spine Wo Contrast  Result Date: 12/10/2020 CLINICAL DATA:  Neck trauma (Age >= 65y) Syncope, fall from standing position. EXAM: CT CERVICAL SPINE WITHOUT CONTRAST TECHNIQUE: Multidetector CT imaging of the cervical spine was performed without intravenous contrast. Multiplanar CT image reconstructions were also generated. COMPARISON:  CT cervical spine 08/26/2020 FINDINGS: Alignment: Stable from prior exam. No traumatic subluxation. Similar grade 1 anterolisthesis of C2 on C3 and C4 on C5. Skull base and vertebrae: No acute fracture. The dens and skull base are intact. Soft tissues and spinal canal: No prevertebral fluid or swelling. No visible canal hematoma. Disc levels: Diffuse degenerative disc disease and facet hypertrophy. Stable degenerative change from prior. C1-C2 degenerative change with mild pannus. Upper chest: No acute findings. Other: Carotid calcifications. IMPRESSION: 1. No acute fracture or subluxation of the cervical spine. 2. Stable degenerative disc disease and facet hypertrophy. Electronically Signed   By: Narda Rutherford M.D.   On: 12/10/2020 21:53   US Carotid Bilateral  Result Date: 12/11/2020 CLINICAL DATA:  Initial evaluation for syncope. EXAM: BILATERAL CAROTID DUPLEX ULTRASOUND TECHNIQUE: Wallace Cullens scale imaging, color Doppler and duplex ultrasound were performed of bilateral carotid and vertebral arteries in the neck. COMPARISON:  Prior CTA from 09/06/2012. FINDINGS: Criteria: Quantification of carotid stenosis is based on velocity parameters that correlate the residual internal carotid diameter with NASCET-based stenosis levels, using the diameter of the distal internal carotid lumen as the denominator for stenosis measurement. The following velocity measurements were obtained: RIGHT ICA: 200/26 cm/sec CCA: 118/15 cm/sec SYSTOLIC ICA/CCA RATIO:  2.1 ECA: 182 cm/sec LEFT ICA: 131/24 cm/sec  CCA: 139/13 cm/sec SYSTOLIC ICA/CCA RATIO:  1.4 ECA: 282 cm/sec RIGHT CAROTID ARTERY: Scattered intimal thickening seen within the visualized right CCA without significant stenosis. Moderate heterogeneous echogenic plaque seen about the right carotid bulb, extending into the proximal right ICA. Elevation in peak systolic velocity up to 200 cm/sec, with systolic ICA/CCA ratio of 2.1, consistent with a 50-69% stenosis. Visualized right ICA irregular but patent distally. Severe stenosis at the origin of the right external carotid artery noted as well. RIGHT VERTEBRAL ARTERY:  Antegrade. LEFT CAROTID ARTERY: Smooth intimal thickening seen within the visualized left CCA without significant stenosis. Heterogeneous echogenic calcified plaque about the left carotid bulb extending into the proximal left ICA. Elevation in peak systolic velocity up to 131 cm/sec, consistent with a 50-69% stenosis. Partially visualized left ICA patent distally. Severe stenosis at the origin of the left external carotid artery noted as well. LEFT VERTEBRAL ARTERY:  Antegrade. IMPRESSION: 1. Heavy calcified plaque about the carotid bulbs/proximal ICAs, with estimated stenoses of up to 50-69% bilaterally, right worse than left. Correlation with dedicated  CTA could be performed for further evaluation as warranted. 2. Severe stenoses at the origins of both external carotid arteries. 3. Patent antegrade flow within the vertebral arteries bilaterally. Electronically Signed   By: Rise MuBenjamin  McClintock M.D.   On: 12/11/2020 04:31   ECHOCARDIOGRAM COMPLETE  Result Date: 12/11/2020    ECHOCARDIOGRAM REPORT   Patient Name:   Theador HawthorneJANICE S Porche Date of Exam: 12/11/2020 Medical Rec #:  [id number]     Height:       66.0 in Accession #:    [id number]    Weight:       134.9 lb Date of Birth:  [birth year]     BSA:          1.692 m Patient Age:    87 years      BP:           157/62 mmHg Patient Gender: F             HR:           79 bpm. Exam Location:  [facility]  Procedure: 2D Echo, Color Doppler and Cardiac Doppler Indications:     R55 Syncope  History:         Patient has prior history of Echocardiogram examinations, most                  recent 11/20/2019. CAD, CKD; Risk Factors:Hypertension, HCL and                  Diabetes.  Sonographer:     Humphrey RollsJoan Heiss Referring Phys:  [id number] Andris BaumannHAZEL V DUNCAN Diagnosing Phys: Alwyn Peawayne D Callwood MD IMPRESSIONS  1. Normal LVF.  2. Normal Wall Motion.  3. Left ventricular ejection fraction, by estimation, is 55 to 60%. The left ventricle has normal function. The left ventricle has no regional wall motion abnormalities. Left ventricular diastolic parameters were normal.  4. Right ventricular systolic function is normal. The right ventricular size is normal.  5. The mitral valve is grossly normal. No evidence of mitral valve regurgitation.  6. The aortic valve is normal in structure. Aortic valve regurgitation is not visualized. FINDINGS  Left Ventricle: Left ventricular ejection fraction, by estimation, is 55 to 60%. The left ventricle has normal function. The left ventricle has no regional wall motion abnormalities. The left ventricular internal cavity size was normal in size. There is  no left ventricular hypertrophy. Left ventricular diastolic parameters were normal. Right Ventricle: The right ventricular size is normal. No increase in right ventricular wall thickness. Right ventricular systolic function is normal. Left Atrium: Left atrial size was normal in size. Right Atrium: Right atrial size was normal in size. Pericardium: There is no evidence of pericardial effusion. Mitral Valve: The mitral valve is grossly normal. No evidence of mitral valve regurgitation. MV peak gradient, 10.1 mmHg. The mean mitral valve gradient is 4.0 mmHg. Tricuspid Valve: The tricuspid valve is normal in structure. Tricuspid valve regurgitation is not demonstrated. Aortic Valve: The aortic valve is normal in structure. Aortic valve regurgitation is not  visualized. Aortic valve mean gradient measures 7.0 mmHg. Aortic valve peak gradient measures 13.5 mmHg. Aortic valve area, by VTI measures 2.21 cm. Pulmonic Valve: The pulmonic valve was normal in structure. Pulmonic valve regurgitation is not visualized. Aorta: The ascending aorta was not well visualized. IAS/Shunts: No atrial level shunt detected by color flow Doppler. Additional Comments: Normal LVF. Normal Wall Motion.  LEFT VENTRICLE PLAX 2D LVIDd:  3.68 cm  Diastology LVIDs:         2.53 cm  LV e' medial:    5.55 cm/s LV PW:         0.90 cm  LV E/e' medial:  16.5 LV IVS:        0.93 cm  LV e' lateral:   5.55 cm/s LVOT diam:     1.80 cm  LV E/e' lateral: 16.5 LV SV:         79 LV SV Index:   47 LVOT Area:     2.54 cm  RIGHT VENTRICLE RV Basal diam:  2.45 cm LEFT ATRIUM           Index       RIGHT ATRIUM          Index LA diam:      3.20 cm 1.89 cm/m  RA Area:     9.76 cm LA Vol (A4C): 55.8 ml 32.98 ml/m RA Volume:   18.40 ml 10.88 ml/m  AORTIC VALVE                    PULMONIC VALVE AV Area (Vmax):    1.99 cm     PV Vmax:          1.05 m/s AV Area (Vmean):   2.27 cm     PV Vmean:         69.400 cm/s AV Area (VTI):     2.21 cm     PV VTI:           0.172 m AV Vmax:           184.00 cm/s  PV Peak grad:     4.4 mmHg AV Vmean:          120.000 cm/s PV Mean grad:     2.0 mmHg AV VTI:            0.360 m      PR End Diast Vel: 4.49 msec AV Peak Grad:      13.5 mmHg AV Mean Grad:      7.0 mmHg LVOT Vmax:         144.00 cm/s LVOT Vmean:        107.000 cm/s LVOT VTI:          0.312 m LVOT/AV VTI ratio: 0.87  AORTA Ao Root diam: 2.80 cm MITRAL VALVE MV Area (PHT): 3.11 cm     SHUNTS MV Area VTI:   2.30 cm     Systemic VTI:  0.31 m MV Peak grad:  10.1 mmHg    Systemic Diam: 1.80 cm MV Mean grad:  4.0 mmHg MV Vmax:       1.59 m/s MV Vmean:      90.1 cm/s MV Decel Time: 244 msec MV E velocity: 91.30 cm/s MV A velocity: 145.00 cm/s MV E/A ratio:  0.63 Dwayne D Callwood MD Electronically signed by Alwyn Pea MD Signature Date/Time: 12/11/2020/2:20:11 PM    Final         Scheduled Meds:  clopidogrel  75 mg Oral Daily   enoxaparin (LOVENOX) injection  30 mg Subcutaneous Q24H   fluticasone  1 spray Each Nare Daily   insulin aspart  0-15 Units Subcutaneous TID WC   insulin aspart  0-5 Units Subcutaneous QHS   rosuvastatin  20 mg Oral Daily   sodium chloride flush  3 mL Intravenous Q12H   timolol  1 drop Both Eyes QHS  vitamin B-12  100 mcg Oral Daily   Continuous Infusions:   LOS: 0 days    Time spent: 31 mins     Charise Killian, MD [facility] Pager [phone]  If 7PM-7AM, please contact night-coverage 12/12/2020, 8:38 AM

## 2020-12-12 NOTE — Evaluation (Signed)
Occupational Therapy Evaluation Patient Details Name: [name] MRN: [id number] DOB: 03-30-1931 Today's Date: 12/12/2020    History of Present Illness 87 year old female with past medical history noted for DM, HTN, CAD, CKD 3B with history of stent placement, and prior admission in 2021 for syncope.  Patient presented with syncope on 12/10/2020 and fall.   Clinical Impression   Ms Weatherly was seen for OT evaluation this date. Prior to hospital admission, pt was MOD I using RW or SPC as needed for limited community distances. Pt lives c son in home c ramped entrance. Pt presents to acute OT demonstrating impaired ADL performance and functional mobility 2/2 decreased strength/balance and poor activity tolerance. Pt currently requires MOD A sup<>sit. MAX A for LBD seated EOB. Pt tolerates ~3 min static sitting EOB c BUE support - c/o headache/dizziness and returned to supine, unable to trial OOB. Pt reports headache as 5/10 in bed, received pain medication prior to session. Pt would benefit from skilled OT to address noted impairments and functional limitations (see below for any additional details) in order to maximize safety and independence while minimizing falls risk and caregiver burden. Upon hospital discharge, recommend STR to maximize pt safety and return to PLOF.  Seated: BP 160/78, MAP 105 - pt c/o headache and dizziness Supine: BP 179/53, MAP 88    Follow Up Recommendations  SNF (pending progress)    Equipment Recommendations  Other (comment) (TBD)    Recommendations for Other Services       Precautions / Restrictions Precautions Precautions: Fall Restrictions Weight Bearing Restrictions: No      Mobility Bed Mobility Overal bed mobility: Needs Assistance Bed Mobility: Supine to Sit;Sit to Supine     Supine to sit: Mod assist Sit to supine: Mod assist        Transfers                 General transfer comment: not tested 2/2 headache and poor sitting  tolerance    Balance Overall balance assessment: Needs assistance Sitting-balance support: Bilateral upper extremity supported;Feet supported Sitting balance-Leahy Scale: Fair                                     ADL either performed or assessed with clinical judgement   ADL Overall ADL's : Needs assistance/impaired                                       General ADL Comments: MAX A for LBD seated EOB. Pt tolerates ~3 min static sitting EOB c BUE support - c/o headache/dizziness  and returned to supine prior to trialing ADL t/f      Pertinent Vitals/Pain Pain Assessment: 0-10 Pain Score: 5  Pain Location: headache Pain Descriptors / Indicators: Grimacing;Headache Pain Intervention(s): Limited activity within patient's tolerance;Premedicated before session     Hand Dominance Right   Extremity/Trunk Assessment Upper Extremity Assessment Upper Extremity Assessment: Generalized weakness   Lower Extremity Assessment Lower Extremity Assessment: Generalized weakness       Communication Communication Communication: No difficulties   Cognition Arousal/Alertness: Awake/alert Behavior During Therapy: WFL for tasks assessed/performed Overall Cognitive Status: Within Functional Limits for tasks assessed  General Comments  Seated: BP 160/78, MAP 105. Supine: BP 179/53, MAP 88    Exercises Exercises: Other exercises Other Exercises Other Exercises: Pt and family educated re; OT role, DME recs, d/c recs, pain mgmt strategies Other Exercises: LBD, sup<>sit, sitting balance/tolerance   Shoulder Instructions      Home Living Family/patient expects to be discharged to:: Private residence Living Arrangements: Children Available Help at Discharge: Family Type of Home: House Home Access: Ramped entrance     Home Layout: One level               Home Equipment: Environmental consultant - 2 wheels;Cane - single  point          Prior Functioning/Environment Level of Independence: Independent with assistive device(s)        Comments: MOD I using RW or SPC for limited community mobility        OT Problem List: Decreased strength;Decreased activity tolerance;Impaired balance (sitting and/or standing);Decreased safety awareness      OT Treatment/Interventions: Self-care/ADL training;Therapeutic exercise;Energy conservation;DME and/or AE instruction;Therapeutic activities;Patient/family education;Balance training    OT Goals(Current goals can be found in the care plan section) Acute Rehab OT Goals Patient Stated Goal: to feel better OT Goal Formulation: With patient/family Time For Goal Achievement: 12/26/20 Potential to Achieve Goals: Fair ADL Goals Pt Will Perform Grooming: with modified independence;sitting Pt Will Transfer to Toilet: with min assist;stand pivot transfer;bedside commode (c LRAD PRN) Pt Will Perform Toileting - Clothing Manipulation and hygiene: with modified independence;sitting/lateral leans  OT Frequency: Min 2X/week    AM-PAC OT "6 Clicks" Daily Activity     Outcome Measure Help from another person eating meals?: None Help from another person taking care of personal grooming?: A Little Help from another person toileting, which includes using toliet, bedpan, or urinal?: A Lot Help from another person bathing (including washing, rinsing, drying)?: A Lot Help from another person to put on and taking off regular upper body clothing?: A Little Help from another person to put on and taking off regular lower body clothing?: A Lot 6 Click Score: 16   End of Session    Activity Tolerance: Patient limited by pain Patient left: in bed;with call bell/phone within reach;with bed alarm set;with family/visitor present  OT Visit Diagnosis: Other abnormalities of gait and mobility (R26.89);Muscle weakness (generalized) (M62.81)                Time: 3154-0086 OT Time  Calculation (min): 23 min Charges:  OT General Charges $OT Visit: 1 Visit OT Evaluation $OT Eval Moderate Complexity: 1 Mod OT Treatments $Self Care/Home Management : 8-22 mins  Kathie Dike, M.S. OTR/L  12/12/20, 4:41 PM  ascom [phone]

## 2020-12-13 DIAGNOSIS — T402X5A Adverse effect of other opioids, initial encounter: Secondary | ICD-10-CM | POA: Diagnosis present

## 2020-12-13 DIAGNOSIS — R509 Fever, unspecified: Secondary | ICD-10-CM | POA: Diagnosis not present

## 2020-12-13 DIAGNOSIS — M25442 Effusion, left hand: Secondary | ICD-10-CM | POA: Diagnosis present

## 2020-12-13 DIAGNOSIS — M542 Cervicalgia: Secondary | ICD-10-CM

## 2020-12-13 DIAGNOSIS — Z955 Presence of coronary angioplasty implant and graft: Secondary | ICD-10-CM | POA: Diagnosis not present

## 2020-12-13 DIAGNOSIS — R296 Repeated falls: Secondary | ICD-10-CM | POA: Diagnosis present

## 2020-12-13 DIAGNOSIS — I10 Essential (primary) hypertension: Secondary | ICD-10-CM | POA: Diagnosis not present

## 2020-12-13 DIAGNOSIS — E7800 Pure hypercholesterolemia, unspecified: Secondary | ICD-10-CM | POA: Diagnosis present

## 2020-12-13 DIAGNOSIS — Z79899 Other long term (current) drug therapy: Secondary | ICD-10-CM | POA: Diagnosis not present

## 2020-12-13 DIAGNOSIS — Z881 Allergy status to other antibiotic agents status: Secondary | ICD-10-CM | POA: Diagnosis not present

## 2020-12-13 DIAGNOSIS — M25531 Pain in right wrist: Secondary | ICD-10-CM | POA: Diagnosis not present

## 2020-12-13 DIAGNOSIS — M1120 Other chondrocalcinosis, unspecified site: Secondary | ICD-10-CM | POA: Diagnosis not present

## 2020-12-13 DIAGNOSIS — E1165 Type 2 diabetes mellitus with hyperglycemia: Secondary | ICD-10-CM | POA: Diagnosis present

## 2020-12-13 DIAGNOSIS — I6523 Occlusion and stenosis of bilateral carotid arteries: Secondary | ICD-10-CM | POA: Diagnosis not present

## 2020-12-13 DIAGNOSIS — G928 Other toxic encephalopathy: Secondary | ICD-10-CM | POA: Diagnosis present

## 2020-12-13 DIAGNOSIS — M1990 Unspecified osteoarthritis, unspecified site: Secondary | ICD-10-CM | POA: Diagnosis not present

## 2020-12-13 DIAGNOSIS — I2510 Atherosclerotic heart disease of native coronary artery without angina pectoris: Secondary | ICD-10-CM | POA: Diagnosis present

## 2020-12-13 DIAGNOSIS — Z888 Allergy status to other drugs, medicaments and biological substances status: Secondary | ICD-10-CM | POA: Diagnosis not present

## 2020-12-13 DIAGNOSIS — I6529 Occlusion and stenosis of unspecified carotid artery: Secondary | ICD-10-CM | POA: Diagnosis not present

## 2020-12-13 DIAGNOSIS — I129 Hypertensive chronic kidney disease with stage 1 through stage 4 chronic kidney disease, or unspecified chronic kidney disease: Secondary | ICD-10-CM | POA: Diagnosis present

## 2020-12-13 DIAGNOSIS — N1832 Chronic kidney disease, stage 3b: Secondary | ICD-10-CM

## 2020-12-13 DIAGNOSIS — J189 Pneumonia, unspecified organism: Secondary | ICD-10-CM | POA: Diagnosis not present

## 2020-12-13 DIAGNOSIS — E1122 Type 2 diabetes mellitus with diabetic chronic kidney disease: Secondary | ICD-10-CM | POA: Diagnosis present

## 2020-12-13 DIAGNOSIS — M25441 Effusion, right hand: Secondary | ICD-10-CM | POA: Diagnosis present

## 2020-12-13 DIAGNOSIS — Z20822 Contact with and (suspected) exposure to covid-19: Secondary | ICD-10-CM | POA: Diagnosis present

## 2020-12-13 DIAGNOSIS — M659 Synovitis and tenosynovitis, unspecified: Secondary | ICD-10-CM | POA: Diagnosis present

## 2020-12-13 DIAGNOSIS — Z7902 Long term (current) use of antithrombotics/antiplatelets: Secondary | ICD-10-CM | POA: Diagnosis not present

## 2020-12-13 DIAGNOSIS — R55 Syncope and collapse: Secondary | ICD-10-CM | POA: Diagnosis present

## 2020-12-13 DIAGNOSIS — M5030 Other cervical disc degeneration, unspecified cervical region: Secondary | ICD-10-CM | POA: Diagnosis present

## 2020-12-13 DIAGNOSIS — Z7982 Long term (current) use of aspirin: Secondary | ICD-10-CM | POA: Diagnosis not present

## 2020-12-13 DIAGNOSIS — I25118 Atherosclerotic heart disease of native coronary artery with other forms of angina pectoris: Secondary | ICD-10-CM | POA: Diagnosis not present

## 2020-12-13 DIAGNOSIS — F0390 Unspecified dementia without behavioral disturbance: Secondary | ICD-10-CM | POA: Diagnosis present

## 2020-12-13 DIAGNOSIS — M1189 Other specified crystal arthropathies, multiple sites: Secondary | ICD-10-CM | POA: Diagnosis not present

## 2020-12-13 DIAGNOSIS — Z90710 Acquired absence of both cervix and uterus: Secondary | ICD-10-CM | POA: Diagnosis not present

## 2020-12-13 DIAGNOSIS — I252 Old myocardial infarction: Secondary | ICD-10-CM | POA: Diagnosis not present

## 2020-12-13 LAB — GLUCOSE, CAPILLARY
Glucose-Capillary: 168 mg/dL — ABNORMAL HIGH (ref 70–99)
Glucose-Capillary: 137 mg/dL — ABNORMAL HIGH (ref 70–99)
Glucose-Capillary: 183 mg/dL — ABNORMAL HIGH (ref 70–99)
Glucose-Capillary: 192 mg/dL — ABNORMAL HIGH (ref 70–99)

## 2020-12-13 LAB — BASIC METABOLIC PANEL
Anion gap: 7 (ref 5–15)
BUN: 19 mg/dL (ref 8–23)
CO2: 26 mmol/L (ref 22–32)
Calcium: 9.3 mg/dL (ref 8.9–10.3)
Chloride: 102 mmol/L (ref 98–111)
Creatinine, Ser: 1.2 mg/dL — ABNORMAL HIGH (ref 0.44–1.00)
GFR, Estimated: 44 mL/min — ABNORMAL LOW (ref >60)
Glucose, Bld: 159 mg/dL — ABNORMAL HIGH (ref 70–99)
Potassium: 4.2 mmol/L (ref 3.5–5.1)
Sodium: 135 mmol/L (ref 135–145)

## 2020-12-13 LAB — CBC
HCT: 38.7 % (ref 36.0–46.0)
Hemoglobin: 13.1 g/dL (ref 12.0–15.0)
MCH: 31.9 pg (ref 26.0–34.0)
MCHC: 33.9 g/dL (ref 30.0–36.0)
MCV: 94.2 fL (ref 80.0–100.0)
Platelets: 361 10*3/uL (ref 150–400)
RBC: 4.11 MIL/uL (ref 3.87–5.11)
RDW: 13.2 % (ref 11.5–15.5)
WBC: 12.6 10*3/uL — ABNORMAL HIGH (ref 4.0–10.5)
nRBC: 0 % (ref 0.0–0.2)

## 2020-12-13 MED ORDER — ISOSORBIDE MONONITRATE ER 30 MG PO TB24
15.0000 mg | ORAL_TABLET | Freq: Every day | ORAL | Status: DC
  Administered 2020-12-13 – 2020-12-14 (×2): 15 mg via ORAL
  Filled 2020-12-13 (×2): qty 1

## 2020-12-13 NOTE — Progress Notes (Addendum)
PROGRESS NOTE    [name]  GBT:[id number] DOB: 02/18/1931 DOA: 12/10/2020 PCP: Danella Penton, MD   Assessment & Plan:   Active Problems:   Carotid stenosis   Essential hypertension   CAD (coronary artery disease)   Type 2 diabetes mellitus (HCC)   Syncope and collapse   Syncope: etiology unclear. W/ hx of prior syncopal event related to medication & has hx of carotid stenosis. Takes multiple anti-HTN meds including amlodipine, clonidine, imdur, aldactone, torsemide. Echo shows EF 55-60%, normal diastolic function & no regional wall motion abnormalities. Continue on home dose of amlodipine and restarted a reduced of imdur today. Will restart aldactone tomorrow   Neck pain: acute on chronic. CT neck on admission showed No acute fracture or subluxation of the cervical spine. Stable degenerative disc disease and facet hypertrophy. Percocet prn for pain   Hx of b/l carotid stenosis: carotid US shows b/l carotid stenosis. Present at least since 2014  HTN: continue on amlodipine and reduced dose of imdur. Will restart aldactone tomorrow    Hx of CAD: no chest pain. Continue to hold home dose of aldactone, imdur   DM2: poorly controlled, HbA1c 7.6. Continue on SSI w/ accuchecks   Likely CKD: baseline Cr/GFR is unknown, currently IIIb. Cr is labile    DVT prophylaxis: lovenox  Code Status: full  Family Communication: discussed pt's care w/ pt's son, [name] and answered his questions Disposition Plan: possibly to SNF   Level of care: Progressive Cardiac  Status is: Inpatient  Remains inpatient appropriate because:IV treatments appropriate due to intensity of illness or inability to take PO and Inpatient level of care appropriate due to severity of illness  Dispo: The patient is from: Home              Anticipated d/c is to: SNF              Patient currently is not medically stable to d/c.   Difficult to place patient: unclear          Consultants:    Procedures:    Antimicrobials:  Subjective: Pt c/o neck pain   Objective: Vitals:   12/12/20 2124 12/12/20 2129 12/13/20 0020 12/13/20 0354  BP: (!) 174/50 (!) 157/48 (!) 161/59 (!) 151/55  Pulse: 77 73 75 79  Resp: 18  18 18   Temp: 98.3 F (36.8 C)  98 F (36.7 C) 98.9 F (37.2 C)  TempSrc:      SpO2: 98% 96% 95% 95%  Weight:    59.4 kg    Intake/Output Summary (Last 24 hours) at 12/13/2020 0816 Last data filed at 12/13/2020 0647 Gross per 24 hour  Intake 200 ml  Output 500 ml  Net -300 ml   Filed Weights   12/11/20 0431 12/12/20 0500 12/13/20 0354  Weight: 61.2 kg 63 kg 59.4 kg    Examination:  General exam: Appears calm but uncomfortable  Respiratory system: Clear breath sounds b/l. No wheezes  Cardiovascular system: S1 & S2 +. No rubs or clicks  Gastrointestinal system: Abd is soft, NT, ND & hypoactive bowel sounds Central nervous system: Alert and oriented. Moves all extremities  Psychiatry: Judgement and insight appear normal. Flat mood and affect     Data Reviewed: I have personally reviewed following labs and imaging studies  CBC: Recent Labs  Lab 12/10/20 2040 12/12/20 0457 12/13/20 0503  WBC 10.8* 13.3* 12.6*  NEUTROABS 5.9  --   --   HGB 12.4 13.5 13.1  HCT 35.2*  38.9 38.7  MCV 92.9 95.1 94.2  PLT 349 392 361   Basic Metabolic Panel: Recent Labs  Lab 12/10/20 2338 12/12/20 0457 12/13/20 0503  NA 135 135 135  K 4.1 4.3 4.2  CL 96* 102 102  CO2 29 24 26   GLUCOSE 181* 140* 159*  BUN 23 20 19   CREATININE 1.36* 1.09* 1.20*  CALCIUM 9.4 9.7 9.3   GFR: Estimated Creatinine Clearance: 30.3 mL/min (A) (by C-G formula based on SCr of 1.2 mg/dL (H)). Liver Function Tests: Recent Labs  Lab 12/10/20 2338  AST 17  ALT 12  ALKPHOS 85  BILITOT 0.8  PROT 7.1  ALBUMIN 3.8   No results for input(s): LIPASE, AMYLASE in the last 168 hours. No results for input(s): AMMONIA in the last 168 hours. Coagulation Profile: No results for input(s): INR,  PROTIME in the last 168 hours. Cardiac Enzymes: No results for input(s): CKTOTAL, CKMB, CKMBINDEX, TROPONINI in the last 168 hours. BNP (last 3 results) No results for input(s): PROBNP in the last 8760 hours. HbA1C: Recent Labs    12/10/20 2040  HGBA1C 7.6*   CBG: Recent Labs  Lab 12/11/20 1356 12/11/20 2018 12/12/20 0813 12/12/20 1335 12/12/20 1832  GLUCAP 158* 191* 178* 220* 184*   Lipid Profile: No results for input(s): CHOL, HDL, LDLCALC, TRIG, CHOLHDL, LDLDIRECT in the last 72 hours. Thyroid Function Tests: No results for input(s): TSH, T4TOTAL, FREET4, T3FREE, THYROIDAB in the last 72 hours. Anemia Panel: No results for input(s): VITAMINB12, FOLATE, FERRITIN, TIBC, IRON, RETICCTPCT in the last 72 hours. Sepsis Labs: No results for input(s): PROCALCITON, LATICACIDVEN in the last 168 hours.  Recent Results (from the past 240 hour(s))  Resp Panel by RT-PCR (Flu A&B, Covid) Nasopharyngeal Swab     Status: None   Collection Time: 12/10/20  9:46 PM   Specimen: Nasopharyngeal Swab; Nasopharyngeal(NP) swabs in vial transport medium  Result Value Ref Range Status   SARS Coronavirus 2 by RT PCR NEGATIVE NEGATIVE Final    Comment: (NOTE) SARS-CoV-2 target nucleic acids are NOT DETECTED.  The SARS-CoV-2 RNA is generally detectable in upper respiratory specimens during the acute phase of infection. The lowest concentration of SARS-CoV-2 viral copies this assay can detect is 138 copies/mL. A negative result does not preclude SARS-Cov-2 infection and should not be used as the sole basis for treatment or other patient management decisions. A negative result may occur with  improper specimen collection/handling, submission of specimen other than nasopharyngeal swab, presence of viral mutation(s) within the areas targeted by this assay, and inadequate number of viral copies(<138 copies/mL). A negative result must be combined with clinical observations, patient history, and  epidemiological information. The expected result is Negative.  Fact Sheet for Patients:  12/14/20  Fact Sheet for Healthcare Providers:  12/12/20  This test is no t yet approved or cleared by the [employer] FDA and  has been authorized for detection and/or diagnosis of SARS-CoV-2 by FDA under an Emergency Use Authorization (EUA). This EUA will remain  in effect (meaning this test can be used) for the duration of the COVID-19 declaration under Section 564(b)(1) of the Act, 21 U.S.C.section 360bbb-3(b)(1), unless the authorization is terminated  or revoked sooner.       Influenza A by PCR NEGATIVE NEGATIVE Final   Influenza B by PCR NEGATIVE NEGATIVE Final    Comment: (NOTE) The Xpert Xpress SARS-CoV-2/FLU/RSV plus assay is intended as an aid in the diagnosis of influenza from Nasopharyngeal swab specimens and should not be  used as a sole basis for treatment. Nasal washings and aspirates are unacceptable for Xpert Xpress SARS-CoV-2/FLU/RSV testing.  Fact Sheet for Patients: [employer]comhttps://www.fda.gov/media/152166/download  Fact Sheet for Healthcare Providers: Serious[employer]ker.ithttps://www.fda.gov/media/152162/download  This test is not yet approved or cleared by the [place] FDA and has been authorized for detection and/or diagnosis of SARS-CoV-2 by FDA under an Emergency Use Authorization (EUA). This EUA will remain in effect (meaning this test can be used) for the duration of the COVID-19 declaration under Section 564(b)(1) of the Act, 21 U.S.C. section 360bbb-3(b)(1), unless the authorization is terminated or revoked.  Performed at [facility], [address] [place], [place] [address]          Radiology Studies: ECHOCARDIOGRAM COMPLETE  Result Date: 12/11/2020    ECHOCARDIOGRAM REPORT   Patient Name:   [name] Date of Exam: 12/11/2020 Medical Rec #:  [id number]     Height:       66.0 in Accession  #:    [id number]    Weight:       134.9 lb Date of Birth:  March 23, 1931     BSA:          1.692 m Patient Age:    87 years      BP:           157/62 mmHg Patient Gender: F             HR:           79 bpm. Exam Location:  [facility] Procedure: 2D Echo, Color Doppler and Cardiac Doppler Indications:     R55 Syncope  History:         Patient has prior history of Echocardiogram examinations, most                  recent 11/20/2019. CAD, CKD; Risk Factors:Hypertension, HCL and                  Diabetes.  Sonographer:     Humphrey RollsJoan Heiss Referring Phys:  [id number] Andris BaumannHAZEL V DUNCAN Diagnosing Phys: Alwyn Peawayne D Callwood MD IMPRESSIONS  1. Normal LVF.  2. Normal Wall Motion.  3. Left ventricular ejection fraction, by estimation, is 55 to 60%. The left ventricle has normal function. The left ventricle has no regional wall motion abnormalities. Left ventricular diastolic parameters were normal.  4. Right ventricular systolic function is normal. The right ventricular size is normal.  5. The mitral valve is grossly normal. No evidence of mitral valve regurgitation.  6. The aortic valve is normal in structure. Aortic valve regurgitation is not visualized. FINDINGS  Left Ventricle: Left ventricular ejection fraction, by estimation, is 55 to 60%. The left ventricle has normal function. The left ventricle has no regional wall motion abnormalities. The left ventricular internal cavity size was normal in size. There is  no left ventricular hypertrophy. Left ventricular diastolic parameters were normal. Right Ventricle: The right ventricular size is normal. No increase in right ventricular wall thickness. Right ventricular systolic function is normal. Left Atrium: Left atrial size was normal in size. Right Atrium: Right atrial size was normal in size. Pericardium: There is no evidence of pericardial effusion. Mitral Valve: The mitral valve is grossly normal. No evidence of mitral valve regurgitation. MV peak gradient, 10.1 mmHg. The mean mitral valve  gradient is 4.0 mmHg. Tricuspid Valve: The tricuspid valve is normal in structure. Tricuspid valve regurgitation is not demonstrated. Aortic Valve: The aortic valve is normal in structure. Aortic valve regurgitation is  not visualized. Aortic valve mean gradient measures 7.0 mmHg. Aortic valve peak gradient measures 13.5 mmHg. Aortic valve area, by VTI measures 2.21 cm. Pulmonic Valve: The pulmonic valve was normal in structure. Pulmonic valve regurgitation is not visualized. Aorta: The ascending aorta was not well visualized. IAS/Shunts: No atrial level shunt detected by color flow Doppler. Additional Comments: Normal LVF. Normal Wall Motion.  LEFT VENTRICLE PLAX 2D LVIDd:         3.68 cm  Diastology LVIDs:         2.53 cm  LV e' medial:    5.55 cm/s LV PW:         0.90 cm  LV E/e' medial:  16.5 LV IVS:        0.93 cm  LV e' lateral:   5.55 cm/s LVOT diam:     1.80 cm  LV E/e' lateral: 16.5 LV SV:         79 LV SV Index:   47 LVOT Area:     2.54 cm  RIGHT VENTRICLE RV Basal diam:  2.45 cm LEFT ATRIUM           Index       RIGHT ATRIUM          Index LA diam:      3.20 cm 1.89 cm/m  RA Area:     9.76 cm LA Vol (A4C): 55.8 ml 32.98 ml/m RA Volume:   18.40 ml 10.88 ml/m  AORTIC VALVE                    PULMONIC VALVE AV Area (Vmax):    1.99 cm     PV Vmax:          1.05 m/s AV Area (Vmean):   2.27 cm     PV Vmean:         69.400 cm/s AV Area (VTI):     2.21 cm     PV VTI:           0.172 m AV Vmax:           184.00 cm/s  PV Peak grad:     4.4 mmHg AV Vmean:          120.000 cm/s PV Mean grad:     2.0 mmHg AV VTI:            0.360 m      PR End Diast Vel: 4.49 msec AV Peak Grad:      13.5 mmHg AV Mean Grad:      7.0 mmHg LVOT Vmax:         144.00 cm/s LVOT Vmean:        107.000 cm/s LVOT VTI:          0.312 m LVOT/AV VTI ratio: 0.87  AORTA Ao Root diam: 2.80 cm MITRAL VALVE MV Area (PHT): 3.11 cm     SHUNTS MV Area VTI:   2.30 cm     Systemic VTI:  0.31 m MV Peak grad:  10.1 mmHg    Systemic Diam: 1.80 cm MV  Mean grad:  4.0 mmHg MV Vmax:       1.59 m/s MV Vmean:      90.1 cm/s MV Decel Time: 244 msec MV E velocity: 91.30 cm/s MV A velocity: 145.00 cm/s MV E/A ratio:  0.63 Dwayne D Callwood MD Electronically signed by Alwyn Pea MD Signature Date/Time: 12/11/2020/2:20:11 PM    Final  Scheduled Meds:  amLODipine  5 mg Oral Daily   clopidogrel  75 mg Oral Daily   docusate sodium  200 mg Oral BID   enoxaparin (LOVENOX) injection  40 mg Subcutaneous Q24H   fluticasone  1 spray Each Nare Daily   insulin aspart  0-15 Units Subcutaneous TID WC   insulin aspart  0-5 Units Subcutaneous QHS   rosuvastatin  20 mg Oral Daily   sodium chloride flush  3 mL Intravenous Q12H   timolol  1 drop Both Eyes QHS   vitamin B-12  100 mcg Oral Daily   Continuous Infusions:   LOS: 0 days    Time spent: 33 mins     Charise Killian, MD [facility] Pager [phone]  If 7PM-7AM, please contact night-coverage 12/13/2020, 8:16 AM

## 2020-12-13 NOTE — Evaluation (Signed)
Physical Therapy Evaluation Patient Details Name: [name] MRN: [id number] DOB: 1930/05/28 Today's Date: 12/13/2020   History of Present Illness  87 year old female with past medical history noted for DM, HTN, CAD, CKD 3B with history of stent placement, and prior admission in 2021 for syncope.  Patient presented with syncope on 12/10/2020 and fall.  Clinical Impression  Pt seen for PT evaluation with pt agreeable to tx. Pt is AxOx3 but continues to state she feels confused & has poor recall of information given to her during the session. Pt is able to initiate movement with bed mobility but ultimately requires max assist for supine>sit. Pt is able to complete sit>stand from EOB & recliner multiple times during session (to allow PT to assist with peri hygiene & donning mesh underwear as pt incontinent with all movement) with min/mod assist with cuing for hand placement. Pt is able to ambulate to door & back with RW & CGA with very slow gait speed, downward vs forward gaze & decreased step length BLE. Pt would benefit from ongoing acute PT tx to progress mobility with LRAD & increase independence with mobility, transfers, & gait.     Follow Up Recommendations SNF    Equipment Recommendations  None recommended by PT    Recommendations for Other Services       Precautions / Restrictions Precautions Precautions: Fall Restrictions Weight Bearing Restrictions: No      Mobility  Bed Mobility Overal bed mobility: Needs Assistance Bed Mobility: Supine to Sit     Supine to sit: Max assist;HOB elevated     General bed mobility comments: pt able to initiate moving BLE to EOB but ultimately requires max assist for supine>sit with HOB elevated & bed rails    Transfers Overall transfer level: Needs assistance Equipment used: Rolling walker (2 wheeled) Transfers: Stand Pivot Transfers;Sit to/from Stand Sit to Stand: Min assist;Mod assist (min assist from elevated EOB, min/mod assist  from recliner; cuing for hand placement to push to standing) Stand pivot transfers: Min assist          Ambulation/Gait Ambulation/Gait assistance: Min guard Gait Distance (Feet): 30 Feet Assistive device: Rolling walker (2 wheeled) Gait Pattern/deviations: Decreased step length - right;Decreased step length - left;Decreased stride length Gait velocity: decreased   General Gait Details: downward vs forward gaze, min assist to negotiate RW around end of bed  Stairs            Wheelchair Mobility    Modified Rankin (Stroke Patients Only)       Balance Overall balance assessment: Needs assistance Sitting-balance support: Bilateral upper extremity supported;Feet supported Sitting balance-Leahy Scale: Fair Sitting balance - Comments: assistance to correct posterior lean but once midline achieved pt is able to maintain with CGA   Standing balance support: Single extremity supported;During functional activity Standing balance-Leahy Scale: Fair Standing balance comment: Pt able to stand & perform peri hygiene with 1UE support on RW & CGA/min assist for standing balance                             Pertinent Vitals/Pain Pain Assessment: Faces Faces Pain Scale: Hurts a little bit Pain Location: Pt initially states she hurts "a little bit, not that much" but unable to state where, then holds R hip as if it hurts, then later c/o generalized soreness. Pain Descriptors / Indicators: Guarding;Grimacing Pain Intervention(s): Monitored during session;Limited activity within patient's tolerance;Repositioned    Home Living Family/patient expects  to be discharged to:: Private residence Living Arrangements: Children Available Help at Discharge: Family Type of Home: House Home Access: Ramped entrance     Home Layout: One level Home Equipment: Environmental consultant - 2 wheels;Cane - single point (rollator)      Prior Function Level of Independence: Independent with assistive  device(s)         Comments: Pt reports using SPC or RW in the house, rollator for community mobility. Lives with her son who provides 24 hr supervision.     Hand Dominance        Extremity/Trunk Assessment   Upper Extremity Assessment Upper Extremity Assessment: Generalized weakness    Lower Extremity Assessment Lower Extremity Assessment: Generalized weakness    Cervical / Trunk Assessment Cervical / Trunk Assessment: Kyphotic  Communication   Communication: HOH  Cognition Arousal/Alertness: Awake/alert Behavior During Therapy: WFL for tasks assessed/performed Overall Cognitive Status: No family/caregiver present to determine baseline cognitive functioning                                 General Comments: Pt AxO to location, time, and self but unable to accurately describe situation re: hospitalization. C/o feeling "confused" but unable to elaborate. Does repeatedly state the purewick isn't working properly with PT educating her multiple times that it was not placed. Pt appears to have poor recall of information PT provides.      General Comments General comments (skin integrity, edema, etc.): HR 85-94 bpm during session    Exercises     Assessment/Plan    PT Assessment Patient needs continued PT services  PT Problem List Decreased strength;Decreased mobility;Decreased safety awareness;Decreased activity tolerance;Decreased knowledge of use of DME;Decreased balance;Pain;Decreased cognition       PT Treatment Interventions DME instruction;Therapeutic activities;Modalities;Cognitive remediation;Gait training;Therapeutic exercise;Patient/family education;Balance training;Functional mobility training;Neuromuscular re-education;Manual techniques    PT Goals (Current goals can be found in the Care Plan section)  Acute Rehab PT Goals Patient Stated Goal: none stated PT Goal Formulation: With patient Time For Goal Achievement: 12/27/20 Potential to  Achieve Goals: Fair    Frequency Min 2X/week   Barriers to discharge        Co-evaluation               AM-PAC PT "6 Clicks" Mobility  Outcome Measure Help needed turning from your back to your side while in a flat bed without using bedrails?: A Lot Help needed moving from lying on your back to sitting on the side of a flat bed without using bedrails?: Total Help needed moving to and from a bed to a chair (including a wheelchair)?: A Little Help needed standing up from a chair using your arms (e.g., wheelchair or bedside chair)?: A Little Help needed to walk in hospital room?: A Little Help needed climbing 3-5 steps with a railing? : Total 6 Click Score: 13    End of Session Equipment Utilized During Treatment: Gait belt Activity Tolerance: Patient tolerated treatment well Patient left: in chair;with chair alarm set;with call bell/phone within reach Nurse Communication: Mobility status PT Visit Diagnosis: Unsteadiness on feet (R26.81);Muscle weakness (generalized) (M62.81)    Time: 1030-1103 PT Time Calculation (min) (ACUTE ONLY): 33 min   Charges:   PT Evaluation $PT Eval Moderate Complexity: 1 Mod PT Treatments $Therapeutic Activity: 23-37 mins        Aleda Grana, PT, DPT 12/13/20, 1:27 PM   Sandi Mariscal 12/13/2020, 1:25 PM

## 2020-12-14 ENCOUNTER — Inpatient Hospital Stay: Payer: Medicare Other

## 2020-12-14 LAB — CBC
HCT: 36.9 % (ref 36.0–46.0)
Hemoglobin: 12.8 g/dL (ref 12.0–15.0)
MCH: 33.3 pg (ref 26.0–34.0)
MCHC: 34.7 g/dL (ref 30.0–36.0)
MCV: 96.1 fL (ref 80.0–100.0)
Platelets: 324 10*3/uL (ref 150–400)
RBC: 3.84 MIL/uL — ABNORMAL LOW (ref 3.87–5.11)
RDW: 13.2 % (ref 11.5–15.5)
WBC: 18.2 10*3/uL — ABNORMAL HIGH (ref 4.0–10.5)
nRBC: 0 % (ref 0.0–0.2)

## 2020-12-14 LAB — BASIC METABOLIC PANEL
Anion gap: 12 (ref 5–15)
BUN: 27 mg/dL — ABNORMAL HIGH (ref 8–23)
CO2: 23 mmol/L (ref 22–32)
Calcium: 9.7 mg/dL (ref 8.9–10.3)
Chloride: 100 mmol/L (ref 98–111)
Creatinine, Ser: 1.35 mg/dL — ABNORMAL HIGH (ref 0.44–1.00)
GFR, Estimated: 38 mL/min — ABNORMAL LOW (ref >60)
Glucose, Bld: 167 mg/dL — ABNORMAL HIGH (ref 70–99)
Potassium: 4.2 mmol/L (ref 3.5–5.1)
Sodium: 135 mmol/L (ref 135–145)

## 2020-12-14 LAB — GLUCOSE, CAPILLARY
Glucose-Capillary: 155 mg/dL — ABNORMAL HIGH (ref 70–99)
Glucose-Capillary: 177 mg/dL — ABNORMAL HIGH (ref 70–99)
Glucose-Capillary: 154 mg/dL — ABNORMAL HIGH (ref 70–99)
Glucose-Capillary: 225 mg/dL — ABNORMAL HIGH (ref 70–99)
Glucose-Capillary: 194 mg/dL — ABNORMAL HIGH (ref 70–99)

## 2020-12-14 MED ORDER — ENSURE ENLIVE PO LIQD: 237.0000 mL | Freq: Three times a day (TID) | ORAL | Status: DC

## 2020-12-14 MED ORDER — CYCLOBENZAPRINE HCL 10 MG PO TABS
5.0000 mg | ORAL_TABLET | Freq: Every day | ORAL | Status: DC | PRN
  Administered 2020-12-15: 5 mg via ORAL
  Filled 2020-12-14: qty 1

## 2020-12-14 MED ORDER — OXYCODONE-ACETAMINOPHEN 5-325 MG PO TABS: 1.0000 | ORAL_TABLET | Freq: Three times a day (TID) | ORAL | Status: DC | PRN

## 2020-12-14 MED ORDER — GLUCERNA SHAKE PO LIQD
237.0000 mL | Freq: Three times a day (TID) | ORAL | Status: DC
  Administered 2020-12-14 – 2020-12-20 (×17): 237 mL via ORAL

## 2020-12-14 MED ORDER — SPIRONOLACTONE 25 MG PO TABS
25.0000 mg | ORAL_TABLET | Freq: Every day | ORAL | Status: DC
  Administered 2020-12-14 – 2020-12-18 (×5): 25 mg via ORAL
  Filled 2020-12-14 (×5): qty 1

## 2020-12-14 NOTE — Progress Notes (Addendum)
PROGRESS NOTE    [name]  MGQ:[id number] DOB: 04-22-1930 DOA: 12/10/2020 PCP: Danella Penton, MD   Assessment & Plan:   Active Problems:   Carotid stenosis   Essential hypertension   CAD (coronary artery disease)   Type 2 diabetes mellitus (HCC)   Syncope and collapse   Syncope   Syncope: etiology unclear. W/ hx of prior syncopal event related to medication & has hx of carotid stenosis. Takes multiple anti-HTN meds including amlodipine, clonidine, imdur, aldactone, torsemide. Echo shows EF 55-60%, normal diastolic function & no regional wall motion abnormalities. Continue on home dose of amlodipine, aldactone, & reduced dose of imdur   Neck pain: acute on chronic. CT neck on admission showed No acute fracture or subluxation of the cervical spine. Stable degenerative disc disease and facet hypertrophy. Percocet prn for pain  Leukocytosis: etiology unclear, possibly reactive but trending up. No fevers. Urine cx ordered. CXR was neg for pneumonia, pneumothorax    Hx of b/l carotid stenosis: carotid US shows b/l carotid stenosis. Present at least since 2014  HTN: continue on amlodipine, aldactone & reduced dose of imdur    Hx of CAD: no chest pain. Continue to hold home dose of aldactone, imdur   DM2: HbA1c 7.6, poorly controlled. Continue on SSI w/ accuchecks  Likely CKD: baseline Cr/GFR is unknown, currently IIIb. Cr is trending up slightly today    DVT prophylaxis: lovenox  Code Status: full  Family Communication: called pt's son, [name] but no answer Disposition Plan: possibly to SNF   Level of care: Progressive Cardiac  Status is: Inpatient  Remains inpatient appropriate because:IV treatments appropriate due to intensity of illness or inability to take PO and Inpatient level of care appropriate due to severity of illness  Dispo: The patient is from: Home              Anticipated d/c is to: SNF              Patient currently is not medically stable to d/c.    Difficult to place patient: unclear          Consultants:    Procedures:   Antimicrobials:  Subjective: Pt is very lethargic   Objective: Vitals:   12/14/20 0100 12/14/20 0500 12/14/20 0517 12/14/20 0803  BP: (!) 163/85  (!) 167/63 (!) 162/54  Pulse: 96  99 85  Resp: 18  20 16   Temp: 98.9 F (37.2 C)  97.7 F (36.5 C) 99.1 F (37.3 C)  TempSrc:   Oral   SpO2: 98%  97% 96%  Weight:  56.8 kg     No intake or output data in the 24 hours ending 12/14/20 0814  Filed Weights   12/13/20 0354 12/13/20 1427 12/14/20 0500  Weight: 59.4 kg 59.4 kg 56.8 kg    Examination:  General exam: Appears lethargic Respiratory system: Clear breath sounds b/l Cardiovascular system: S1/S2+. No rubs or clicks  Gastrointestinal system: Abd is soft, NT, ND & hypoactive bowel sounds  Central nervous system: Lethargic. Moves all extremities  Psychiatry: Judgement and insight appear normal. Flat mood and affect    Data Reviewed: I have personally reviewed following labs and imaging studies  CBC: Recent Labs  Lab 12/10/20 2040 12/12/20 0457 12/13/20 0503 12/14/20 0547  WBC 10.8* 13.3* 12.6* 18.2*  NEUTROABS 5.9  --   --   --   HGB 12.4 13.5 13.1 12.8  HCT 35.2* 38.9 38.7 36.9  MCV 92.9 95.1 94.2 96.1  PLT  349 392 361 324   Basic Metabolic Panel: Recent Labs  Lab 12/10/20 2338 12/12/20 0457 12/13/20 0503 12/14/20 0547  NA 135 135 135 135  K 4.1 4.3 4.2 4.2  CL 96* 102 102 100  CO2 29 24 26 23   GLUCOSE 181* 140* 159* 167*  BUN 23 20 19  27*  CREATININE 1.36* 1.09* 1.20* 1.35*  CALCIUM 9.4 9.7 9.3 9.7   GFR: Estimated Creatinine Clearance: 25.8 mL/min (A) (by C-G formula based on SCr of 1.35 mg/dL (H)). Liver Function Tests: Recent Labs  Lab 12/10/20 2338  AST 17  ALT 12  ALKPHOS 85  BILITOT 0.8  PROT 7.1  ALBUMIN 3.8   No results for input(s): LIPASE, AMYLASE in the last 168 hours. No results for input(s): AMMONIA in the last 168 hours. Coagulation  Profile: No results for input(s): INR, PROTIME in the last 168 hours. Cardiac Enzymes: No results for input(s): CKTOTAL, CKMB, CKMBINDEX, TROPONINI in the last 168 hours. BNP (last 3 results) No results for input(s): PROBNP in the last 8760 hours. HbA1C: No results for input(s): HGBA1C in the last 72 hours.  CBG: Recent Labs  Lab 12/13/20 0837 12/13/20 1158 12/13/20 1649 12/13/20 2104 12/14/20 0515  GLUCAP 168* 137* 183* 192* 155*   Lipid Profile: No results for input(s): CHOL, HDL, LDLCALC, TRIG, CHOLHDL, LDLDIRECT in the last 72 hours. Thyroid Function Tests: No results for input(s): TSH, T4TOTAL, FREET4, T3FREE, THYROIDAB in the last 72 hours. Anemia Panel: No results for input(s): VITAMINB12, FOLATE, FERRITIN, TIBC, IRON, RETICCTPCT in the last 72 hours. Sepsis Labs: No results for input(s): PROCALCITON, LATICACIDVEN in the last 168 hours.  Recent Results (from the past 240 hour(s))  Resp Panel by RT-PCR (Flu A&B, Covid) Nasopharyngeal Swab     Status: None   Collection Time: 12/10/20  9:46 PM   Specimen: Nasopharyngeal Swab; Nasopharyngeal(NP) swabs in vial transport medium  Result Value Ref Range Status   SARS Coronavirus 2 by RT PCR NEGATIVE NEGATIVE Final    Comment: (NOTE) SARS-CoV-2 target nucleic acids are NOT DETECTED.  The SARS-CoV-2 RNA is generally detectable in upper respiratory specimens during the acute phase of infection. The lowest concentration of SARS-CoV-2 viral copies this assay can detect is 138 copies/mL. A negative result does not preclude SARS-Cov-2 infection and should not be used as the sole basis for treatment or other patient management decisions. A negative result may occur with  improper specimen collection/handling, submission of specimen other than nasopharyngeal swab, presence of viral mutation(s) within the areas targeted by this assay, and inadequate number of viral copies(<138 copies/mL). A negative result must be combined  with clinical observations, patient history, and epidemiological information. The expected result is Negative.  Fact Sheet for Patients:  12/16/20  Fact Sheet for Healthcare Providers:  12/12/20  This test is no t yet approved or cleared by the [employer] FDA and  has been authorized for detection and/or diagnosis of SARS-CoV-2 by FDA under an Emergency Use Authorization (EUA). This EUA will remain  in effect (meaning this test can be used) for the duration of the COVID-19 declaration under Section 564(b)(1) of the Act, 21 U.S.C.section 360bbb-3(b)(1), unless the authorization is terminated  or revoked sooner.       Influenza A by PCR NEGATIVE NEGATIVE Final   Influenza B by PCR NEGATIVE NEGATIVE Final    Comment: (NOTE) The Xpert Xpress SARS-CoV-2/FLU/RSV plus assay is intended as an aid in the diagnosis of influenza from Nasopharyngeal swab specimens and should not  be used as a sole basis for treatment. Nasal washings and aspirates are unacceptable for Xpert Xpress SARS-CoV-2/FLU/RSV testing.  Fact Sheet for Patients: BloggerCourse.com  Fact Sheet for Healthcare Providers: SeriousBroker.it  This test is not yet approved or cleared by the [place] FDA and has been authorized for detection and/or diagnosis of SARS-CoV-2 by FDA under an Emergency Use Authorization (EUA). This EUA will remain in effect (meaning this test can be used) for the duration of the COVID-19 declaration under Section 564(b)(1) of the Act, 21 U.S.C. section 360bbb-3(b)(1), unless the authorization is terminated or revoked.  Performed at [facility], [address], [place], [place] [address]          Radiology Studies: No results found.      Scheduled Meds:  amLODipine  5 mg Oral Daily   clopidogrel  75 mg Oral Daily   docusate sodium  200 mg Oral BID    enoxaparin (LOVENOX) injection  40 mg Subcutaneous Q24H   fluticasone  1 spray Each Nare Daily   insulin aspart  0-15 Units Subcutaneous TID WC   insulin aspart  0-5 Units Subcutaneous QHS   isosorbide mononitrate  15 mg Oral Daily   rosuvastatin  20 mg Oral Daily   sodium chloride flush  3 mL Intravenous Q12H   timolol  1 drop Both Eyes QHS   vitamin B-12  100 mcg Oral Daily   Continuous Infusions:   LOS: 1 day    Time spent: 30 mins     Charise Killian, MD [facility] Pager [phone]  If 7PM-7AM, please contact night-coverage 12/14/2020, 8:14 AM

## 2020-12-14 NOTE — Progress Notes (Signed)
OT Cancellation Note  Patient Details Name: [name] MRN: [id number] DOB: [birth year]   Cancelled Treatment:    Reason Eval/Treat Not Completed: Patient declined, no reason specified. Upon arrival pt wakens to voice, states she would not be comfortable sitting up in the chair and defers ADL tx session. Will follow up at later date/time as able and pt willing to participate.   Kathie Dike, M.S. OTR/L  12/14/20, 12:50 PM  ascom [phone]

## 2020-12-15 ENCOUNTER — Inpatient Hospital Stay: Payer: Medicare Other

## 2020-12-15 LAB — CBC
HCT: 32.5 % — ABNORMAL LOW (ref 36.0–46.0)
Hemoglobin: 11.6 g/dL — ABNORMAL LOW (ref 12.0–15.0)
MCH: 33.8 pg (ref 26.0–34.0)
MCHC: 35.7 g/dL (ref 30.0–36.0)
MCV: 94.8 fL (ref 80.0–100.0)
Platelets: 332 10*3/uL (ref 150–400)
RBC: 3.43 MIL/uL — ABNORMAL LOW (ref 3.87–5.11)
RDW: 13 % (ref 11.5–15.5)
WBC: 15.8 10*3/uL — ABNORMAL HIGH (ref 4.0–10.5)
nRBC: 0 % (ref 0.0–0.2)

## 2020-12-15 LAB — BASIC METABOLIC PANEL
Anion gap: 9 (ref 5–15)
BUN: 35 mg/dL — ABNORMAL HIGH (ref 8–23)
CO2: 26 mmol/L (ref 22–32)
Calcium: 9.6 mg/dL (ref 8.9–10.3)
Chloride: 97 mmol/L — ABNORMAL LOW (ref 98–111)
Creatinine, Ser: 1.3 mg/dL — ABNORMAL HIGH (ref 0.44–1.00)
GFR, Estimated: 40 mL/min — ABNORMAL LOW (ref >60)
Glucose, Bld: 165 mg/dL — ABNORMAL HIGH (ref 70–99)
Potassium: 4.4 mmol/L (ref 3.5–5.1)
Sodium: 132 mmol/L — ABNORMAL LOW (ref 135–145)

## 2020-12-15 LAB — URINE CULTURE: Culture: NO GROWTH

## 2020-12-15 LAB — GLUCOSE, CAPILLARY
Glucose-Capillary: 159 mg/dL — ABNORMAL HIGH (ref 70–99)
Glucose-Capillary: 135 mg/dL — ABNORMAL HIGH (ref 70–99)
Glucose-Capillary: 160 mg/dL — ABNORMAL HIGH (ref 70–99)
Glucose-Capillary: 145 mg/dL — ABNORMAL HIGH (ref 70–99)
Glucose-Capillary: 176 mg/dL — ABNORMAL HIGH (ref 70–99)

## 2020-12-15 MED ORDER — LIDOCAINE 5 % EX PTCH: 1.0000 | MEDICATED_PATCH | CUTANEOUS | Status: DC

## 2020-12-15 MED ORDER — LIDOCAINE 5 % EX PTCH
1.0000 | MEDICATED_PATCH | Freq: Every day | CUTANEOUS | Status: DC | PRN
  Filled 2020-12-15: qty 1

## 2020-12-15 MED ORDER — CHLORHEXIDINE GLUCONATE CLOTH 2 % EX PADS
6.0000 | MEDICATED_PAD | Freq: Every day | CUTANEOUS | Status: DC
  Administered 2020-12-15 – 2020-12-20 (×6): 6 via TOPICAL

## 2020-12-15 MED ORDER — MORPHINE SULFATE (PF) 2 MG/ML IV SOLN
2.0000 mg | INTRAVENOUS | Status: DC | PRN
  Administered 2020-12-15: 2 mg via INTRAVENOUS
  Filled 2020-12-15 (×2): qty 1

## 2020-12-15 MED ORDER — ISOSORBIDE MONONITRATE ER 30 MG PO TB24
30.0000 mg | ORAL_TABLET | Freq: Every day | ORAL | Status: DC
  Administered 2020-12-15: 30 mg via ORAL
  Filled 2020-12-15: qty 1

## 2020-12-15 NOTE — Progress Notes (Addendum)
PROGRESS NOTE    [name]  WCH:[id number] DOB: 1930-07-09 DOA: 12/10/2020 PCP: Danella Penton, MD   Assessment & Plan:   Active Problems:   Carotid stenosis   Essential hypertension   CAD (coronary artery disease)   Type 2 diabetes mellitus (HCC)   Syncope and collapse   Syncope   Syncope: etiology unclear. W/ hx of prior syncopal event related to medication & has hx of carotid stenosis. Takes multiple anti-HTN meds including amlodipine, clonidine, imdur, aldactone, torsemide. Echo shows EF 55-60%, normal diastolic function & no regional wall motion abnormalities. Continue on home dose of amlodipine, aldactone, & reduced dose of imdur   Neck pain: acute on chronic. CT neck on admission showed No acute fracture or subluxation of the cervical spine. Stable degenerative disc disease and facet hypertrophy. Will start lidocaine patch as percocet likely causing pt to be very lethargic. D/c percocet and flexeril   Leukocytosis: etiology unclear, trending down slightly today. No fevers. Urine cx shows no growth. CXR was neg for pneumonia, pneumothorax    Hx of b/l carotid stenosis: carotid US shows b/l carotid stenosis & present at least since 2014.   HTN: continue on reduced dose of imdur, aldactone, amlodipine    Hx of CAD: no chest pain. Continue to hold home dose of aldactone, imdur   DM2: HbA1c 7.6, poorly controlled. Continue on SSI w/ accuchecks  Likely CKD: baseline Cr/GFR is unknown, currently IIIb. Cr is trending down today    DVT prophylaxis: lovenox  Code Status: full  Family Communication: called pt's son, [name], again, no answer so I left a voicemail  Disposition Plan: possibly to SNF   Level of care: Progressive Cardiac  Status is: Inpatient  Remains inpatient appropriate because:IV treatments appropriate due to intensity of illness or inability to take PO and Inpatient level of care appropriate due to severity of illness  Dispo: The patient is from: Home               Anticipated d/c is to: SNF              Patient currently is not medically stable to d/c.   Difficult to place patient: unclear     Consultants:    Procedures:   Antimicrobials:  Subjective: Pt c/o pain all over her entire body   Objective: Vitals:   12/15/20 0531 12/15/20 0546 12/15/20 0744 12/15/20 1210  BP: (!) 164/53  (!) 161/50 (!) 145/53  Pulse: 79  86 89  Resp: 20  15 15   Temp: 98.6 F (37 C)  98.7 F (37.1 C) 100.2 F (37.9 C)  TempSrc:      SpO2: 97%  96% 96%  Weight:  59 kg    Height:        Intake/Output Summary (Last 24 hours) at 12/15/2020 1432 Last data filed at 12/15/2020 1345 Gross per 24 hour  Intake 677 ml  Output 700 ml  Net -23 ml    Filed Weights   12/13/20 1427 12/14/20 0500 12/15/20 0546  Weight: 59.4 kg 56.8 kg 59 kg    Examination:  General exam: appears lethargic  Respiratory system: Clear breath sounds b/l. No rubs or clicks  Cardiovascular system: S1 & S2+. No rubs or clicks  Gastrointestinal system: Abd is soft, NT, ND & hypoactive bowel sounds  Central nervous system: lethargic. Moves all extremities   Psychiatry: Judgement and insight appear normal. Flat mood and affect    Data Reviewed: I have personally reviewed following  labs and imaging studies  CBC: Recent Labs  Lab 12/10/20 2040 12/12/20 0457 12/13/20 0503 12/14/20 0547 12/15/20 0449  WBC 10.8* 13.3* 12.6* 18.2* 15.8*  NEUTROABS 5.9  --   --   --   --   HGB 12.4 13.5 13.1 12.8 11.6*  HCT 35.2* 38.9 38.7 36.9 32.5*  MCV 92.9 95.1 94.2 96.1 94.8  PLT 349 392 361 324 332   Basic Metabolic Panel: Recent Labs  Lab 12/10/20 2338 12/12/20 0457 12/13/20 0503 12/14/20 0547 12/15/20 0449  NA 135 135 135 135 132*  K 4.1 4.3 4.2 4.2 4.4  CL 96* 102 102 100 97*  CO2 29 24 26 23 26   GLUCOSE 181* 140* 159* 167* 165*  BUN 23 20 19  27* 35*  CREATININE 1.36* 1.09* 1.20* 1.35* 1.30*  CALCIUM 9.4 9.7 9.3 9.7 9.6   GFR: Estimated Creatinine Clearance:  27.9 mL/min (A) (by C-G formula based on SCr of 1.3 mg/dL (H)). Liver Function Tests: Recent Labs  Lab 12/10/20 2338  AST 17  ALT 12  ALKPHOS 85  BILITOT 0.8  PROT 7.1  ALBUMIN 3.8   No results for input(s): LIPASE, AMYLASE in the last 168 hours. No results for input(s): AMMONIA in the last 168 hours. Coagulation Profile: No results for input(s): INR, PROTIME in the last 168 hours. Cardiac Enzymes: No results for input(s): CKTOTAL, CKMB, CKMBINDEX, TROPONINI in the last 168 hours. BNP (last 3 results) No results for input(s): PROBNP in the last 8760 hours. HbA1C: No results for input(s): HGBA1C in the last 72 hours.  CBG: Recent Labs  Lab 12/14/20 1705 12/14/20 2155 12/15/20 0534 12/15/20 0745 12/15/20 1116  GLUCAP 225* 194* 159* 135* 160*   Lipid Profile: No results for input(s): CHOL, HDL, LDLCALC, TRIG, CHOLHDL, LDLDIRECT in the last 72 hours. Thyroid Function Tests: No results for input(s): TSH, T4TOTAL, FREET4, T3FREE, THYROIDAB in the last 72 hours. Anemia Panel: No results for input(s): VITAMINB12, FOLATE, FERRITIN, TIBC, IRON, RETICCTPCT in the last 72 hours. Sepsis Labs: No results for input(s): PROCALCITON, LATICACIDVEN in the last 168 hours.  Recent Results (from the past 240 hour(s))  Resp Panel by RT-PCR (Flu A&B, Covid) Nasopharyngeal Swab     Status: None   Collection Time: 12/10/20  9:46 PM   Specimen: Nasopharyngeal Swab; Nasopharyngeal(NP) swabs in vial transport medium  Result Value Ref Range Status   SARS Coronavirus 2 by RT PCR NEGATIVE NEGATIVE Final    Comment: (NOTE) SARS-CoV-2 target nucleic acids are NOT DETECTED.  The SARS-CoV-2 RNA is generally detectable in upper respiratory specimens during the acute phase of infection. The lowest concentration of SARS-CoV-2 viral copies this assay can detect is 138 copies/mL. A negative result does not preclude SARS-Cov-2 infection and should not be used as the sole basis for treatment or other  patient management decisions. A negative result may occur with  improper specimen collection/handling, submission of specimen other than nasopharyngeal swab, presence of viral mutation(s) within the areas targeted by this assay, and inadequate number of viral copies(<138 copies/mL). A negative result must be combined with clinical observations, patient history, and epidemiological information. The expected result is Negative.  Fact Sheet for Patients:  12/17/20  Fact Sheet for Healthcare Providers:  12/12/20  This test is no t yet approved or cleared by the [employer] FDA and  has been authorized for detection and/or diagnosis of SARS-CoV-2 by FDA under an Emergency Use Authorization (EUA). This EUA will remain  in effect (meaning this test can be  used) for the duration of the COVID-19 declaration under Section 564(b)(1) of the Act, 21 U.S.C.section 360bbb-3(b)(1), unless the authorization is terminated  or revoked sooner.       Influenza A by PCR NEGATIVE NEGATIVE Final   Influenza B by PCR NEGATIVE NEGATIVE Final    Comment: (NOTE) The Xpert Xpress SARS-CoV-2/FLU/RSV plus assay is intended as an aid in the diagnosis of influenza from Nasopharyngeal swab specimens and should not be used as a sole basis for treatment. Nasal washings and aspirates are unacceptable for Xpert Xpress SARS-CoV-2/FLU/RSV testing.  Fact Sheet for Patients: BloggerCourse.com  Fact Sheet for Healthcare Providers: SeriousBroker.it  This test is not yet approved or cleared by the [place] FDA and has been authorized for detection and/or diagnosis of SARS-CoV-2 by FDA under an Emergency Use Authorization (EUA). This EUA will remain in effect (meaning this test can be used) for the duration of the COVID-19 declaration under Section 564(b)(1) of the Act, 21 U.S.C. section  360bbb-3(b)(1), unless the authorization is terminated or revoked.  Performed at [facility], [address], [place], [place] [address]   Urine Culture     Status: None   Collection Time: 12/14/20  3:15 PM   Specimen: Urine, Random  Result Value Ref Range Status   Specimen Description   Final    URINE, RANDOM Performed at [facility], [address], [place], [place] [address]    Special Requests   Final    NONE Performed at [facility], [address], [place], [place] [address]    Culture   Final    NO GROWTH Performed at [facility], [address] [place], [place] [address]    Report Status 12/15/2020 FINAL  Final         Radiology Studies: DG Chest Port 1 View  Result Date: 12/14/2020 CLINICAL DATA:  Syncope and fall, concern for pneumonia. EXAM: PORTABLE CHEST 1 VIEW COMPARISON:  Chest radiograph dated 08/26/2020. FINDINGS: The heart size is normal. Vascular calcifications are seen in the aortic arch. The left costophrenic angle is obscured which may reflect a trace pleural effusion and or atelectasis/airspace disease. The right lung is clear with no pleural effusion. There is no pneumothorax. Degenerative changes are seen in the spine. IMPRESSION: Obscured left costophrenic angle may reflect trace pleural effusion or atelectasis/airspace disease. Electronically Signed   By: Romona Curls M.D.   On: 12/14/2020 10:09        Scheduled Meds:  amLODipine  5 mg Oral Daily   clopidogrel  75 mg Oral Daily   docusate sodium  200 mg Oral BID   enoxaparin (LOVENOX) injection  40 mg Subcutaneous Q24H   feeding supplement (GLUCERNA SHAKE)  237 mL Oral TID   fluticasone  1 spray Each Nare Daily   insulin aspart  0-15 Units Subcutaneous TID WC   insulin aspart  0-5 Units Subcutaneous QHS   isosorbide mononitrate  30 mg Oral Daily   rosuvastatin  20 mg Oral Daily   sodium chloride flush  3 mL Intravenous Q12H   spironolactone  25 mg  Oral Daily   timolol  1 drop Both Eyes QHS   vitamin B-12  100 mcg Oral Daily   Continuous Infusions:   LOS: 2 days    Time spent: 48 mins     Charise Killian, MD [facility] Pager [phone]  If 7PM-7AM, please contact night-coverage 12/15/2020, 2:32 PM

## 2020-12-16 LAB — BASIC METABOLIC PANEL
Anion gap: 11 (ref 5–15)
BUN: 31 mg/dL — ABNORMAL HIGH (ref 8–23)
CO2: 23 mmol/L (ref 22–32)
Calcium: 10 mg/dL (ref 8.9–10.3)
Chloride: 98 mmol/L (ref 98–111)
Creatinine, Ser: 1.12 mg/dL — ABNORMAL HIGH (ref 0.44–1.00)
GFR, Estimated: 47 mL/min — ABNORMAL LOW (ref >60)
Glucose, Bld: 156 mg/dL — ABNORMAL HIGH (ref 70–99)
Potassium: 4.1 mmol/L (ref 3.5–5.1)
Sodium: 132 mmol/L — ABNORMAL LOW (ref 135–145)

## 2020-12-16 LAB — RESP PANEL BY RT-PCR (FLU A&B, COVID) ARPGX2
Influenza A by PCR: NEGATIVE
Influenza B by PCR: NEGATIVE
SARS Coronavirus 2 by RT PCR: NEGATIVE

## 2020-12-16 LAB — CBC
HCT: 33.5 % — ABNORMAL LOW (ref 36.0–46.0)
Hemoglobin: 11.7 g/dL — ABNORMAL LOW (ref 12.0–15.0)
MCH: 32.2 pg (ref 26.0–34.0)
MCHC: 34.9 g/dL (ref 30.0–36.0)
MCV: 92.3 fL (ref 80.0–100.0)
Platelets: 370 10*3/uL (ref 150–400)
RBC: 3.63 MIL/uL — ABNORMAL LOW (ref 3.87–5.11)
RDW: 13 % (ref 11.5–15.5)
WBC: 14.4 10*3/uL — ABNORMAL HIGH (ref 4.0–10.5)
nRBC: 0 % (ref 0.0–0.2)

## 2020-12-16 LAB — GLUCOSE, CAPILLARY
Glucose-Capillary: 165 mg/dL — ABNORMAL HIGH (ref 70–99)
Glucose-Capillary: 234 mg/dL — ABNORMAL HIGH (ref 70–99)
Glucose-Capillary: 179 mg/dL — ABNORMAL HIGH (ref 70–99)

## 2020-12-16 MED ORDER — ISOSORBIDE MONONITRATE ER 60 MG PO TB24
60.0000 mg | ORAL_TABLET | Freq: Every day | ORAL | Status: DC
  Administered 2020-12-16 – 2020-12-20 (×5): 60 mg via ORAL
  Filled 2020-12-16 (×5): qty 1

## 2020-12-16 MED ORDER — POLYETHYLENE GLYCOL 3350 17 G PO PACK
17.0000 g | PACK | Freq: Every day | ORAL | Status: DC
  Administered 2020-12-16 – 2020-12-20 (×5): 17 g via ORAL
  Filled 2020-12-16 (×5): qty 1

## 2020-12-16 NOTE — Progress Notes (Signed)
Attempts made to call both of patient's sons on facesheet, lvm pending call back.   [place], [place] [phone]

## 2020-12-16 NOTE — Care Management Important Message (Signed)
Important Message  Patient Details  Name: [name] MRN: [id number] Date of Birth: 06-Dec-1930   Medicare Important Message Given:  Yes     Johnell Comings 12/16/2020, 2:31 PM

## 2020-12-16 NOTE — Progress Notes (Signed)
Physical Therapy Treatment Patient Details Name: [name] MRN: [id number] DOB: 08-Jul-1930 Today's Date: 12/16/2020    History of Present Illness 87 year old female with past medical history noted for DM, HTN, CAD, CKD 3B with history of stent placement, and prior admission in 2021 for syncope.  Patient presented with syncope on 12/10/2020 and fall.    PT Comments    Pt seen for PT tx with pt demonstrating worsening cognition as compared to when this PT evaluated her (pt only oriented to self). Pt requires max +1-2 assist for bed mobility & max assist +2 for stand pivot transfer to [facility]. Pt demonstrates impaired awareness, motor planning, and initiation/execution of tasks. Will continue to follow pt acutely to address bed mobility, transfers & gait as able. Continue to recommend STR upon d/c.    Follow Up Recommendations  SNF     Equipment Recommendations  None recommended by PT    Recommendations for Other Services       Precautions / Restrictions Precautions Precautions: Fall Restrictions Weight Bearing Restrictions: No    Mobility  Bed Mobility Overal bed mobility: Needs Assistance Bed Mobility: Supine to Sit;Sit to Supine     Supine to sit: Max assist;HOB elevated Sit to supine: Max assist;+2 for physical assistance;HOB elevated   General bed mobility comments: Pt with little initiation of movement    Transfers Overall transfer level: Needs assistance Equipment used: Rolling walker (2 wheeled) Transfers: Pharmacologist;Sit to/from Stand Sit to Stand: Max assist;+2 physical assistance Stand pivot transfers: Max assist;+2 physical assistance       General transfer comment: Pt requires max assist +1-2 for sit>stand with max cuing to push to standing vs pulling on RW, pt with great difficulty sequencing steps to complete stand pivot & requires max manual facilitation  Ambulation/Gait                 Stairs             Wheelchair  Mobility    Modified Rankin (Stroke Patients Only)       Balance Overall balance assessment: Needs assistance Sitting-balance support: Feet unsupported;Bilateral upper extremity supported Sitting balance-Leahy Scale: Fair Sitting balance - Comments: supervision static sittng on BSC   Standing balance support: Bilateral upper extremity supported;During functional activity Standing balance-Leahy Scale: Zero Standing balance comment: max assist 2/2 posterior lean with inability to shift pelvis anteriorly & upright trunk                            Cognition Arousal/Alertness: Awake/alert Behavior During Therapy: Flat affect Overall Cognitive Status: No family/caregiver present to determine baseline cognitive functioning                                 General Comments: Pt more confused today, only oriented to self, stating she's at the place "where the picnic was", requires significantly extra time but inconsistently follows commands, impaired processing & motor planning      Exercises      General Comments General comments (skin integrity, edema, etc.): Pt with continent BM on BSC with dependent assist for peri hygiene      Pertinent Vitals/Pain Pain Assessment: Faces Faces Pain Scale: Hurts little more Pain Location: R hand Pain Descriptors / Indicators: Discomfort Pain Intervention(s): Monitored during session    Home Living  Prior Function            PT Goals (current goals can now be found in the care plan section) Acute Rehab PT Goals Patient Stated Goal: none stated PT Goal Formulation: With patient Time For Goal Achievement: 12/27/20 Potential to Achieve Goals: Fair Progress towards PT goals: PT to reassess next treatment    Frequency    Min 2X/week      PT Plan Current plan remains appropriate    Co-evaluation              AM-PAC PT "6 Clicks" Mobility   Outcome Measure  Help needed  turning from your back to your side while in a flat bed without using bedrails?: Total Help needed moving from lying on your back to sitting on the side of a flat bed without using bedrails?: Total Help needed moving to and from a bed to a chair (including a wheelchair)?: Total Help needed standing up from a chair using your arms (e.g., wheelchair or bedside chair)?: Total Help needed to walk in hospital room?: Total Help needed climbing 3-5 steps with a railing? : Total 6 Click Score: 6    End of Session Equipment Utilized During Treatment: Gait belt Activity Tolerance: Patient tolerated treatment well Patient left: in bed;with call bell/phone within reach;with bed alarm set Nurse Communication:  (MD of impaired cognition as compared to evaluation) PT Visit Diagnosis: Unsteadiness on feet (R26.81);Muscle weakness (generalized) (M62.81);Difficulty in walking, not elsewhere classified (R26.2)     Time: 1411-1436 PT Time Calculation (min) (ACUTE ONLY): 25 min  Charges:  $Therapeutic Activity: 23-37 mins                     Aleda Grana, PT, DPT 12/16/20, 3:37 PM    Sandi Mariscal 12/16/2020, 3:36 PM
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
## 2020-12-16 NOTE — Progress Notes (Addendum)
PROGRESS NOTE    [name]  TDD:[id number] DOB: 1930-12-19 DOA: 12/10/2020 PCP: Danella Penton, MD   Assessment & Plan:   Active Problems:   Carotid stenosis   Essential hypertension   CAD (coronary artery disease)   Type 2 diabetes mellitus (HCC)   Syncope and collapse   Syncope   Syncope: etiology unclear. W/ hx of prior syncopal event related to medication & has hx of carotid stenosis. Takes multiple anti-HTN meds including amlodipine, clonidine, imdur, aldactone, torsemide. Echo shows EF 55-60%, normal diastolic function & no regional wall motion abnormalities. Continue on home dose of amlodipine, aldactone, & imdur   Neck pain: acute on chronic. CT neck on admission showed No acute fracture or subluxation of the cervical spine. Stable degenerative disc disease and facet hypertrophy. Continue w/ lidocaine patch prn as percocet likely causing pt to be very lethargic. All narcotics/muscle relaxants were d/c 8/28  Leukocytosis: etiology unclear, trending down again today. Fever overnight. Blood cxs ordered this AM. Urine cx shows no growth. CXR was neg for pneumonia, pneumothorax. Repeat COVID19 & influenza are both neg   Encephalopathy: etiology unclear, metabolic vs toxic. Infectious work-up so far has been neg. CT head on admission was neg for acute intracranial findings   Hx of b/l carotid stenosis: carotid US shows b/l carotid stenosis & present at least since 2014.   HTN: continue on imdur, aldactone, amlodipine    Hx of CAD: no chest pain. Continue on imdur, aldactone   DM2: poorly controlled, HbA1c 7.6. Continue on SSI w/ accuchecks   Likely CKD: baseline Cr/GFR is unknown, currently IIIb. Cr is trending down today    DVT prophylaxis: lovenox  Code Status: full  Family Communication: discussed pt's care w/ pt's son, [name], and answered his questions  Disposition Plan: possibly to SNF   Level of care: Progressive Cardiac  Status is: Inpatient  Remains  inpatient appropriate because:IV treatments appropriate due to intensity of illness or inability to take PO and Inpatient level of care appropriate due to severity of illness  Dispo: The patient is from: Home              Anticipated d/c is to: SNF              Patient currently is not medically stable to d/c.   Difficult to place patient: unclear     Consultants:    Procedures:   Antimicrobials:  Subjective: Pt still c/o pain over her entire body   Objective: Vitals:   12/15/20 2312 12/16/20 0359 12/16/20 0509 12/16/20 0752  BP: (!) 154/51 (!) 130/107  (!) 170/63  Pulse: 79 94  (!) 102  Resp: 18 18    Temp: 98.4 F (36.9 C) 97.9 F (36.6 C)  99.1 F (37.3 C)  TempSrc:      SpO2: 97% 100%  97%  Weight:   58.3 kg   Height:        Intake/Output Summary (Last 24 hours) at 12/16/2020 0802 Last data filed at 12/16/2020 6237 Gross per 24 hour  Intake 360 ml  Output 1300 ml  Net -940 ml    Filed Weights   12/14/20 0500 12/15/20 0546 12/16/20 0509  Weight: 56.8 kg 59 kg 58.3 kg    Examination:  General exam: appears lethargic  Respiratory system: Clear breath sounds b/l Cardiovascular system: S1/S2+. No rubs or clicks  Gastrointestinal system: Abd is soft, NT, ND & hypoactive bowel sounds  Central nervous system: lethargic. Moves all extremities  Psychiatry: Judgement and insight appear abnormal. Flat mood and affect    Data Reviewed: I have personally reviewed following labs and imaging studies  CBC: Recent Labs  Lab 12/10/20 2040 12/12/20 0457 12/13/20 0503 12/14/20 0547 12/15/20 0449 12/16/20 0551  WBC 10.8* 13.3* 12.6* 18.2* 15.8* 14.4*  NEUTROABS 5.9  --   --   --   --   --   HGB 12.4 13.5 13.1 12.8 11.6* 11.7*  HCT 35.2* 38.9 38.7 36.9 32.5* 33.5*  MCV 92.9 95.1 94.2 96.1 94.8 92.3  PLT 349 392 361 324 332 370   Basic Metabolic Panel: Recent Labs  Lab 12/12/20 0457 12/13/20 0503 12/14/20 0547 12/15/20 0449 12/16/20 0551  NA 135 135 135  132* 132*  K 4.3 4.2 4.2 4.4 4.1  CL 102 102 100 97* 98  CO2 24 26 23 26 23   GLUCOSE 140* 159* 167* 165* 156*  BUN 20 19 27* 35* 31*  CREATININE 1.09* 1.20* 1.35* 1.30* 1.12*  CALCIUM 9.7 9.3 9.7 9.6 10.0   GFR: Estimated Creatinine Clearance: 32 mL/min (A) (by C-G formula based on SCr of 1.12 mg/dL (H)). Liver Function Tests: Recent Labs  Lab 12/10/20 2338  AST 17  ALT 12  ALKPHOS 85  BILITOT 0.8  PROT 7.1  ALBUMIN 3.8   No results for input(s): LIPASE, AMYLASE in the last 168 hours. No results for input(s): AMMONIA in the last 168 hours. Coagulation Profile: No results for input(s): INR, PROTIME in the last 168 hours. Cardiac Enzymes: No results for input(s): CKTOTAL, CKMB, CKMBINDEX, TROPONINI in the last 168 hours. BNP (last 3 results) No results for input(s): PROBNP in the last 8760 hours. HbA1C: No results for input(s): HGBA1C in the last 72 hours.  CBG: Recent Labs  Lab 12/15/20 0534 12/15/20 0745 12/15/20 1116 12/15/20 1612 12/15/20 2147  GLUCAP 159* 135* 160* 145* 176*   Lipid Profile: No results for input(s): CHOL, HDL, LDLCALC, TRIG, CHOLHDL, LDLDIRECT in the last 72 hours. Thyroid Function Tests: No results for input(s): TSH, T4TOTAL, FREET4, T3FREE, THYROIDAB in the last 72 hours. Anemia Panel: No results for input(s): VITAMINB12, FOLATE, FERRITIN, TIBC, IRON, RETICCTPCT in the last 72 hours. Sepsis Labs: No results for input(s): PROCALCITON, LATICACIDVEN in the last 168 hours.  Recent Results (from the past 240 hour(s))  Resp Panel by RT-PCR (Flu A&B, Covid) Nasopharyngeal Swab     Status: None   Collection Time: 12/10/20  9:46 PM   Specimen: Nasopharyngeal Swab; Nasopharyngeal(NP) swabs in vial transport medium  Result Value Ref Range Status   SARS Coronavirus 2 by RT PCR NEGATIVE NEGATIVE Final    Comment: (NOTE) SARS-CoV-2 target nucleic acids are NOT DETECTED.  The SARS-CoV-2 RNA is generally detectable in upper respiratory specimens  during the acute phase of infection. The lowest concentration of SARS-CoV-2 viral copies this assay can detect is 138 copies/mL. A negative result does not preclude SARS-Cov-2 infection and should not be used as the sole basis for treatment or other patient management decisions. A negative result may occur with  improper specimen collection/handling, submission of specimen other than nasopharyngeal swab, presence of viral mutation(s) within the areas targeted by this assay, and inadequate number of viral copies(<138 copies/mL). A negative result must be combined with clinical observations, patient history, and epidemiological information. The expected result is Negative.  Fact Sheet for Patients:  12/12/20  Fact Sheet for Healthcare Providers:  BloggerCourse.com  This test is no t yet approved or cleared by the [employer]it and  has been authorized for detection and/or diagnosis of SARS-CoV-2 by FDA under an Emergency Use Authorization (EUA). This EUA will remain  in effect (meaning this test can be used) for the duration of the COVID-19 declaration under Section 564(b)(1) of the Act, 21 U.S.C.section 360bbb-3(b)(1), unless the authorization is terminated  or revoked sooner.       Influenza A by PCR NEGATIVE NEGATIVE Final   Influenza B by PCR NEGATIVE NEGATIVE Final    Comment: (NOTE) The Xpert Xpress SARS-CoV-2/FLU/RSV plus assay is intended as an aid in the diagnosis of influenza from Nasopharyngeal swab specimens and should not be used as a sole basis for treatment. Nasal washings and aspirates are unacceptable for Xpert Xpress SARS-CoV-2/FLU/RSV testing.  Fact Sheet for Patients: BloggerCourse.com  Fact Sheet for Healthcare Providers: SeriousBroker.it  This test is not yet approved or cleared by the [place] FDA and has been authorized for detection  and/or diagnosis of SARS-CoV-2 by FDA under an Emergency Use Authorization (EUA). This EUA will remain in effect (meaning this test can be used) for the duration of the COVID-19 declaration under Section 564(b)(1) of the Act, 21 U.S.C. section 360bbb-3(b)(1), unless the authorization is terminated or revoked.  Performed at [facility], [address], [place], [place] [address]   Urine Culture     Status: None   Collection Time: 12/14/20  3:15 PM   Specimen: Urine, Random  Result Value Ref Range Status   Specimen Description   Final    URINE, RANDOM Performed at [facility], [address], [place], [place] [address]    Special Requests   Final    NONE Performed at [facility], [address], [place], [place] [address]    Culture   Final    NO GROWTH Performed at [facility], [address] [place], [place] [address]    Report Status 12/15/2020 FINAL  Final         Radiology Studies: DG Hand 2 View Left  Result Date: 12/15/2020 CLINICAL DATA:  Pain after fall. EXAM: LEFT HAND - 2 VIEW COMPARISON:  March 15, 2019 FINDINGS: There is no evidence of fracture or dislocation. There is no evidence of arthropathy or other focal bone abnormality. Soft tissues are unremarkable. IMPRESSION: Negative. Electronically Signed   By: Gerome Sam III M.D.   On: 12/15/2020 20:26   DG Chest Port 1 View  Result Date: 12/14/2020 CLINICAL DATA:  Syncope and fall, concern for pneumonia. EXAM: PORTABLE CHEST 1 VIEW COMPARISON:  Chest radiograph dated 08/26/2020. FINDINGS: The heart size is normal. Vascular calcifications are seen in the aortic arch. The left costophrenic angle is obscured which may reflect a trace pleural effusion and or atelectasis/airspace disease. The right lung is clear with no pleural effusion. There is no pneumothorax. Degenerative changes are seen in the spine. IMPRESSION: Obscured left costophrenic angle may reflect trace  pleural effusion or atelectasis/airspace disease. Electronically Signed   By: Romona Curls M.D.   On: 12/14/2020 10:09   DG HIP UNILAT WITH PELVIS 1V LEFT  Result Date: 12/15/2020 CLINICAL DATA:  Pain after fall EXAM: DG HIP (WITH OR WITHOUT PELVIS) 1V*L* COMPARISON:  None. FINDINGS: Vascular calcifications.  No fracture or dislocation. IMPRESSION: Negative. Electronically Signed   By: Gerome Sam III M.D.   On: 12/15/2020 20:27        Scheduled Meds:  amLODipine  5 mg Oral Daily   Chlorhexidine Gluconate Cloth  6 each Topical Daily  clopidogrel  75 mg Oral Daily   docusate sodium  200 mg Oral BID   enoxaparin (LOVENOX) injection  40 mg Subcutaneous Q24H   feeding supplement (GLUCERNA SHAKE)  237 mL Oral TID   fluticasone  1 spray Each Nare Daily   insulin aspart  0-15 Units Subcutaneous TID WC   insulin aspart  0-5 Units Subcutaneous QHS   isosorbide mononitrate  30 mg Oral Daily   rosuvastatin  20 mg Oral Daily   sodium chloride flush  3 mL Intravenous Q12H   spironolactone  25 mg Oral Daily   timolol  1 drop Both Eyes QHS   vitamin B-12  100 mcg Oral Daily   Continuous Infusions:   LOS: 3 days    Time spent: 30 mins     Charise Killian, MD [facility] Pager [phone]  If 7PM-7AM, please contact night-coverage 12/16/2020, 8:02 AM

## 2020-12-17 ENCOUNTER — Inpatient Hospital Stay: Payer: Medicare Other

## 2020-12-17 DIAGNOSIS — M1990 Unspecified osteoarthritis, unspecified site: Secondary | ICD-10-CM

## 2020-12-17 DIAGNOSIS — R509 Fever, unspecified: Secondary | ICD-10-CM

## 2020-12-17 LAB — COMPREHENSIVE METABOLIC PANEL
ALT: 77 U/L — ABNORMAL HIGH (ref 0–44)
AST: 85 U/L — ABNORMAL HIGH (ref 15–41)
Albumin: 2.6 g/dL — ABNORMAL LOW (ref 3.5–5.0)
Alkaline Phosphatase: 163 U/L — ABNORMAL HIGH (ref 38–126)
Anion gap: 8 (ref 5–15)
BUN: 43 mg/dL — ABNORMAL HIGH (ref 8–23)
CO2: 25 mmol/L (ref 22–32)
Calcium: 9.9 mg/dL (ref 8.9–10.3)
Chloride: 101 mmol/L (ref 98–111)
Creatinine, Ser: 1.28 mg/dL — ABNORMAL HIGH (ref 0.44–1.00)
GFR, Estimated: 40 mL/min — ABNORMAL LOW (ref >60)
Glucose, Bld: 177 mg/dL — ABNORMAL HIGH (ref 70–99)
Potassium: 4.3 mmol/L (ref 3.5–5.1)
Sodium: 134 mmol/L — ABNORMAL LOW (ref 135–145)
Total Bilirubin: 0.7 mg/dL (ref 0.3–1.2)
Total Protein: 6.8 g/dL (ref 6.5–8.1)

## 2020-12-17 LAB — CBC
HCT: 31.8 % — ABNORMAL LOW (ref 36.0–46.0)
Hemoglobin: 11.1 g/dL — ABNORMAL LOW (ref 12.0–15.0)
MCH: 32.7 pg (ref 26.0–34.0)
MCHC: 34.9 g/dL (ref 30.0–36.0)
MCV: 93.8 fL (ref 80.0–100.0)
Platelets: 362 10*3/uL (ref 150–400)
RBC: 3.39 MIL/uL — ABNORMAL LOW (ref 3.87–5.11)
RDW: 13.2 % (ref 11.5–15.5)
WBC: 13 10*3/uL — ABNORMAL HIGH (ref 4.0–10.5)
nRBC: 0 % (ref 0.0–0.2)

## 2020-12-17 LAB — GLUCOSE, CAPILLARY
Glucose-Capillary: 174 mg/dL — ABNORMAL HIGH (ref 70–99)
Glucose-Capillary: 174 mg/dL — ABNORMAL HIGH (ref 70–99)
Glucose-Capillary: 173 mg/dL — ABNORMAL HIGH (ref 70–99)
Glucose-Capillary: 125 mg/dL — ABNORMAL HIGH (ref 70–99)
Glucose-Capillary: 159 mg/dL — ABNORMAL HIGH (ref 70–99)

## 2020-12-17 LAB — AMMONIA: Ammonia: <9 umol/L — ABNORMAL LOW (ref 9–35)

## 2020-12-17 MED ORDER — TRAMADOL HCL 50 MG PO TABS
50.0000 mg | ORAL_TABLET | Freq: Four times a day (QID) | ORAL | Status: DC | PRN
  Administered 2020-12-17 – 2020-12-19 (×3): 50 mg via ORAL
  Filled 2020-12-17 (×3): qty 1

## 2020-12-17 MED ORDER — ENOXAPARIN SODIUM 30 MG/0.3ML IJ SOSY
30.0000 mg | PREFILLED_SYRINGE | INTRAMUSCULAR | Status: DC
  Administered 2020-12-17 – 2020-12-19 (×3): 30 mg via SUBCUTANEOUS
  Filled 2020-12-17 (×3): qty 0.3

## 2020-12-17 NOTE — Progress Notes (Signed)
PHARMACIST - PHYSICIAN COMMUNICATION  CONCERNING:  Enoxaparin (Lovenox) for DVT Prophylaxis    RECOMMENDATION: Patient was prescribed enoxaprin 40mg  q24 hours for VTE prophylaxis.   Filed Weights   12/15/20 0546 12/16/20 0509 12/17/20 0103  Weight: 59 kg (130 lb 1.1 oz) 58.3 kg (128 lb 8.5 oz) 58.9 kg (129 lb 13.6 oz)    Body mass index is 20.96 kg/m.  Estimated Creatinine Clearance: 28.2 mL/min (A) (by C-G formula based on SCr of 1.28 mg/dL (H)).   Patient is candidate for enoxaparin 30mg  every 24 hours based on CrCl <49ml/min   DESCRIPTION: Pharmacy has adjusted enoxaparin dose per [facility] policy.  Patient is now receiving enoxaparin 30 mg every 24 hours    31m, PharmD, BCPS Clinical Pharmacist 12/17/2020 7:28 AM

## 2020-12-17 NOTE — Consult Note (Addendum)
NAME: [name]  DOB: 08/22/1930  MRN: [id number]  Date/Time: 12/17/2020 2:35 PM  REQUESTING PROVIDER: williams Subjective:  REASON FOR CONSULT: fever ? [name] is a 87 y.o. with a history of DM, HTN, CAD, Dementia Presented to ED on 12/10/20 after a syncopal episode. Patient has a history of syncope related to beta-blocker induced bradycardia in 2021.  This admission patient does not remember anything.  She was sitting in the dining table about to have a meal and then she remembers feeling like she was about to pass out and the next thing she remembered was being in the hospital.  In the ED she was afebrile pulse was 66, BP 158/50 and oxygen sats 97% on room air.  Blood work revealed WBC of 10,800, Hb of creatinine of 1.36.  Troponin was normal.  Urinalysis was unremarkable.  COVID and flu was negative.  EKG showed normal sinus rhythm at 64.  CT head and cervical spine had no abnormalities.  She was hospitalized for syncope and collapse and given some fluids.. She was on multiple hypertensive medications including amlodipine, clonidine, Imdur, Aldactone, torsemide.  Echo showed EF of 55 to 60% with normal diastolic function and no regional wall abnormalities. Patient was having low-grade fever and leukocytosis.  Hence blood culture urine culture chest x-ray were all done and they were all negative.  As she was having neck pain she was prescribed Percocet which was causing to be lethargic.  She was also on muscle relaxants which were all discontinued on 12/15/2020. Pt is c/o rt wrist pain and also hurting low back and other joints Says this started after she fell Xray of the wrist does not show any fracture  e Past Medical History:  Diagnosis Date   Hypercholesteremia    Hypertension    Myocardial infarction ([place]) 2008   S/P angioplasty with stent     Past Surgical History:  Procedure Laterality Date   ABDOMINAL HYSTERECTOMY     APPENDECTOMY     CORONARY ANGIOPLASTY WITH STENT  PLACEMENT     CORONARY STENT INTERVENTION N/A 12/12/2019   Procedure: CORONARY STENT INTERVENTION;  Surgeon: Isaias Cowman, MD;  Location: [facility];  Service: Cardiovascular;  Laterality: N/A;   LEFT HEART CATH AND CORONARY ANGIOGRAPHY N/A 12/12/2019   Procedure: LEFT HEART CATH AND CORONARY ANGIOGRAPHY with possible PCI;  Surgeon: Isaias Cowman, MD;  Location: [facility];  Service: Cardiovascular;  Laterality: N/A;    Social History   Socioeconomic History   Marital status: Widowed    Spouse name: Not on file   Number of children: Not on file   Years of education: Not on file   Highest education level: Not on file  Occupational History   Not on file  Tobacco Use   Smoking status: Never   Smokeless tobacco: Never  Vaping Use   Vaping Use: Never used  Substance and Sexual Activity   Alcohol use: No   Drug use: No   Sexual activity: Not on file  Other Topics Concern   Not on file  Social History Narrative   Not on file   Social Determinants of Health   Financial Resource Strain: Not on file  Food Insecurity: Not on file  Transportation Needs: Not on file  Physical Activity: Not on file  Stress: Not on file  Social Connections: Not on file  Intimate Partner Violence: Not on file    Family History  Problem Relation Age of Onset  Breast cancer Maternal Aunt    Allergies  Allergen Reactions   Butenafine Swelling    Tongue swelled and turned red   Levofloxacin Swelling and Other (See Comments)    Other reaction(s): Other (See Comments) Tongue swelled and turned red   Limonene Other (See Comments)    Other reaction(s): Other (See Comments) Tongue swelled and turned red Tongue swelled and turned red    Penicillins     Other reaction(s): Unknown   Terbinafine And Related Other (See Comments)    Tongue swelled and turned red   I? Current Facility-Administered Medications  Medication Dose Route Frequency Provider Last Rate Last  Admin   acetaminophen (TYLENOL) tablet 650 mg  650 mg Oral Q6H PRN Athena Masse, MD   650 mg at 12/17/20 0076   Or   acetaminophen (TYLENOL) suppository 650 mg  650 mg Rectal Q6H PRN Athena Masse, MD       amLODipine (NORVASC) tablet 5 mg  5 mg Oral Daily Wyvonnia Dusky, MD   5 mg at 12/17/20 2263   Chlorhexidine Gluconate Cloth 2 % PADS 6 each  6 each Topical Daily Wyvonnia Dusky, MD   6 each at 12/16/20 0844   clopidogrel (PLAVIX) tablet 75 mg  75 mg Oral Daily Sharion Settler, NP   75 mg at 12/17/20 3354   docusate sodium (COLACE) capsule 200 mg  200 mg Oral BID Wyvonnia Dusky, MD   200 mg at 12/17/20 0909   enoxaparin (LOVENOX) injection 30 mg  30 mg Subcutaneous Q24H Lu Duffel, RPH   30 mg at 12/17/20 5625   feeding supplement (GLUCERNA SHAKE) (GLUCERNA SHAKE) liquid 237 mL  237 mL Oral TID Wyvonnia Dusky, MD   237 mL at 12/17/20 1251   fluticasone (FLONASE) 50 MCG/ACT nasal spray 1 spray  1 spray Each Nare Daily Sharion Settler, NP   1 spray at 12/17/20 0908   hydrALAZINE (APRESOLINE) injection 10 mg  10 mg Intravenous Q6H PRN Sharion Settler, NP   10 mg at 12/12/20 0458   insulin aspart (novoLOG) injection 0-15 Units  0-15 Units Subcutaneous TID WC Athena Masse, MD   3 Units at 12/17/20 1249   insulin aspart (novoLOG) injection 0-5 Units  0-5 Units Subcutaneous QHS Athena Masse, MD       isosorbide mononitrate (IMDUR) 24 hr tablet 60 mg  60 mg Oral Daily Wyvonnia Dusky, MD   60 mg at 12/17/20 0909   lidocaine (LIDODERM) 5 % 1 patch  1 patch Transdermal Daily PRN Wyvonnia Dusky, MD       nitroGLYCERIN (NITROSTAT) SL tablet 0.4 mg  0.4 mg Sublingual Q5 min PRN Sharion Settler, NP       ondansetron [facility]) tablet 4 mg  4 mg Oral Q6H PRN Athena Masse, MD   4 mg at 12/12/20 6389   Or   ondansetron (ZOFRAN) injection 4 mg  4 mg Intravenous Q6H PRN Athena Masse, MD       polyethylene glycol (MIRALAX / GLYCOLAX) packet 17 g  17 g Oral  Daily Wyvonnia Dusky, MD   17 g at 12/17/20 0908   rosuvastatin (CRESTOR) tablet 20 mg  20 mg Oral Daily Sharion Settler, NP   20 mg at 12/17/20 0909   sodium chloride flush (NS) 0.9 % injection 3 mL  3 mL Intravenous Q12H Athena Masse, MD   3 mL at 12/17/20 0913   spironolactone (ALDACTONE) tablet 25  mg  25 mg Oral Daily Wyvonnia Dusky, MD   25 mg at 12/17/20 9163   timolol (TIMOPTIC) 0.5 % ophthalmic solution 1 drop  1 drop Both Eyes QHS Sharion Settler, NP   1 drop at 12/16/20 2245   traMADol (ULTRAM) tablet 50 mg  50 mg Oral Q6H PRN Wyvonnia Dusky, MD       vitamin B-12 (CYANOCOBALAMIN) tablet 100 mcg  100 mcg Oral Daily Sharion Settler, NP   100 mcg at 12/17/20 0909     Abtx:  Anti-infectives (From admission, onward)    None       REVIEW OF SYSTEMS:  Const: negative fever, negative chills, negative weight loss Eyes: negative diplopia or visual changes, negative eye pain ENT: negative coryza, negative sore throat Resp: negative cough, hemoptysis, dyspnea Cards: negative for chest pain, palpitations, lower extremity edema GU: negative for frequency, dysuria and hematuria GI: Negative for abdominal pain, diarrhea, bleeding, constipation Skin: negative for rash and pruritus Heme: negative for easy bruising and gum/nose bleeding MS: pain rt wrist but has pain in the back and other joints Neurolo:some confusion Psych: f anxiety,  Endocrine: negative for thyroid, diabetes Allergy/Immunology- as above ?  Objective:  VITALS:  BP (!) 145/53 (BP Location: Right Arm)   Pulse 76   Temp 97.8 F (36.6 C)   Resp 17   Ht 5' 6"  (1.676 m)   Wt 58.9 kg   SpO2 98%   BMI 20.96 kg/m  PHYSICAL EXAM:  General: Alert, cooperative, in  distress, oriented in person, place, year, month Head: Normocephalic, without obvious abnormality, atraumatic. Eyes: Conjunctivae clear, anicteric sclerae. Pupils are equal ENT Nares normal. No drainage or sinus tenderness. Lips,  mucosa, and tongue normal. No Thrush Neck: , symmetrical, no adenopathy, thyroid: non tender no carotid bruit and no JVD. Back: No CVA tenderness. Lungs: b/l air entry Heart: Regular rate and rhythm, no murmur, rub or gallop. Abdomen: Soft, non-tender,not distended. Bowel sounds normal. No masses Extremities: rt wrist swollen, erythematous, tender   Skin: No rashes or lesions. Or bruising Lymph: Cervical, supraclavicular normal. Neurologic: Grossly non-focal Pertinent Labs Lab Results CBC    Component Value Date/Time   WBC 13.0 (H) 12/17/2020 0522   RBC 3.39 (L) 12/17/2020 0522   HGB 11.1 (L) 12/17/2020 0522   HGB 11.4 (L) 08/16/2012 0255   HCT 31.8 (L) 12/17/2020 0522   HCT 33.0 (L) 08/16/2012 0255   PLT 362 12/17/2020 0522   PLT 235 08/16/2012 0255   MCV 93.8 12/17/2020 0522   MCV 95 08/16/2012 0255   MCH 32.7 12/17/2020 0522   MCHC 34.9 12/17/2020 0522   RDW 13.2 12/17/2020 0522   RDW 13.1 08/16/2012 0255   LYMPHSABS 3.4 12/10/2020 2040   LYMPHSABS 2.3 08/16/2012 0255   MONOABS 0.9 12/10/2020 2040   MONOABS 0.7 08/16/2012 0255   EOSABS 0.5 12/10/2020 2040   EOSABS 0.3 08/16/2012 0255   BASOSABS 0.1 12/10/2020 2040   BASOSABS 0.1 08/16/2012 0255    CMP Latest Ref Rng & Units 12/17/2020 12/16/2020 12/15/2020  Glucose 70 - 99 mg/dL 177(H) 156(H) 165(H)  BUN 8 - 23 mg/dL 43(H) 31(H) 35(H)  Creatinine 0.44 - 1.00 mg/dL 1.28(H) 1.12(H) 1.30(H)  Sodium 135 - 145 mmol/L 134(L) 132(L) 132(L)  Potassium 3.5 - 5.1 mmol/L 4.3 4.1 4.4  Chloride 98 - 111 mmol/L 101 98 97(L)  CO2 22 - 32 mmol/L 25 23 26   Calcium 8.9 - 10.3 mg/dL 9.9 10.0 9.6  Total Protein 6.5 -  8.1 g/dL 6.8 - -  Total Bilirubin 0.3 - 1.2 mg/dL 0.7 - -  Alkaline Phos 38 - 126 U/L 163(H) - -  AST 15 - 41 U/L 85(H) - -  ALT 0 - 44 U/L 77(H) - -      Microbiology: Recent Results (from the past 240 hour(s))  Resp Panel by RT-PCR (Flu A&B, Covid) Nasopharyngeal Swab     Status: None   Collection Time:  12/10/20  9:46 PM   Specimen: Nasopharyngeal Swab; Nasopharyngeal(NP) swabs in vial transport medium  Result Value Ref Range Status   SARS Coronavirus 2 by RT PCR NEGATIVE NEGATIVE Final    Comment: (NOTE) SARS-CoV-2 target nucleic acids are NOT DETECTED.  The SARS-CoV-2 RNA is generally detectable in upper respiratory specimens during the acute phase of infection. The lowest concentration of SARS-CoV-2 viral copies this assay can detect is 138 copies/mL. A negative result does not preclude SARS-Cov-2 infection and should not be used as the sole basis for treatment or other patient management decisions. A negative result may occur with  improper specimen collection/handling, submission of specimen other than nasopharyngeal swab, presence of viral mutation(s) within the areas targeted by this assay, and inadequate number of viral copies(<138 copies/mL). A negative result must be combined with clinical observations, patient history, and epidemiological information. The expected result is Negative.  Fact Sheet for Patients:  EntrepreneurPulse.com.au  Fact Sheet for Healthcare Providers:  IncredibleEmployment.be  This test is no t yet approved or cleared by the [place] FDA and  has been authorized for detection and/or diagnosis of SARS-CoV-2 by FDA under an Emergency Use Authorization (EUA). This EUA will remain  in effect (meaning this test can be used) for the duration of the COVID-19 declaration under Section 564(b)(1) of the Act, 21 U.S.C.section 360bbb-3(b)(1), unless the authorization is terminated  or revoked sooner.       Influenza A by PCR NEGATIVE NEGATIVE Final   Influenza B by PCR NEGATIVE NEGATIVE Final    Comment: (NOTE) The Xpert Xpress SARS-CoV-2/FLU/RSV plus assay is intended as an aid in the diagnosis of influenza from Nasopharyngeal swab specimens and should not be used as a sole basis for treatment. Nasal washings  and aspirates are unacceptable for Xpert Xpress SARS-CoV-2/FLU/RSV testing.  Fact Sheet for Patients: EntrepreneurPulse.com.au  Fact Sheet for Healthcare Providers: IncredibleEmployment.be  This test is not yet approved or cleared by the [place] FDA and has been authorized for detection and/or diagnosis of SARS-CoV-2 by FDA under an Emergency Use Authorization (EUA). This EUA will remain in effect (meaning this test can be used) for the duration of the COVID-19 declaration under Section 564(b)(1) of the Act, 21 U.S.C. section 360bbb-3(b)(1), unless the authorization is terminated or revoked.  Performed at [facility], [address], [place], [place] [address]   Urine Culture     Status: None   Collection Time: 12/14/20  3:15 PM   Specimen: Urine, Random  Result Value Ref Range Status   Specimen Description   Final    URINE, RANDOM Performed at [facility], [address] [place], [place] [address]    Special Requests   Final    NONE Performed at [facility], [address] [place], [place] [address]    Culture   Final    NO GROWTH Performed at [facility], [address] [place], [place] [address]    Report Status 12/15/2020 FINAL  Final  CULTURE, BLOOD (ROUTINE X 2) w Reflex  to ID Panel     Status: None (Preliminary result)   Collection Time: 12/16/20  8:17 AM   Specimen: BLOOD  Result Value Ref Range Status   Specimen Description BLOOD RIGHT [facility]  Final   Special Requests   Final    BOTTLES DRAWN AEROBIC AND ANAEROBIC Blood Culture adequate volume   Culture   Final    NO GROWTH < 24 HOURS Performed at [facility], [address], [place], [place] [address]    Report Status PENDING  Incomplete  CULTURE, BLOOD (ROUTINE X 2) w Reflex to ID Panel     Status: None (Preliminary result)   Collection Time: 12/16/20  8:27 AM   Specimen: BLOOD  Result Value Ref Range Status    Specimen Description BLOOD RIGHT WRIST  Final   Special Requests   Final    BOTTLES DRAWN AEROBIC AND ANAEROBIC Blood Culture results may not be optimal due to an inadequate volume of blood received in culture bottles   Culture   Final    NO GROWTH < 24 HOURS Performed at [facility], [address], [place], [place] [address]    Report Status PENDING  Incomplete  Resp Panel by RT-PCR (Flu A&B, Covid) Nasopharyngeal Swab     Status: None   Collection Time: 12/16/20  9:44 AM   Specimen: Nasopharyngeal Swab; Nasopharyngeal(NP) swabs in vial transport medium  Result Value Ref Range Status   SARS Coronavirus 2 by RT PCR NEGATIVE NEGATIVE Final    Comment: (NOTE) SARS-CoV-2 target nucleic acids are NOT DETECTED.  The SARS-CoV-2 RNA is generally detectable in upper respiratory specimens during the acute phase of infection. The lowest concentration of SARS-CoV-2 viral copies this assay can detect is 138 copies/mL. A negative result does not preclude SARS-Cov-2 infection and should not be used as the sole basis for treatment or other patient management decisions. A negative result may occur with  improper specimen collection/handling, submission of specimen other than nasopharyngeal swab, presence of viral mutation(s) within the areas targeted by this assay, and inadequate number of viral copies(<138 copies/mL). A negative result must be combined with clinical observations, patient history, and epidemiological information. The expected result is Negative.  Fact Sheet for Patients:  EntrepreneurPulse.com.au  Fact Sheet for Healthcare Providers:  IncredibleEmployment.be  This test is no t yet approved or cleared by the [place] FDA and  has been authorized for detection and/or diagnosis of SARS-CoV-2 by FDA under an Emergency Use Authorization (EUA). This EUA will remain  in effect (meaning this test can be used) for the duration of  the COVID-19 declaration under Section 564(b)(1) of the Act, 21 U.S.C.section 360bbb-3(b)(1), unless the authorization is terminated  or revoked sooner.       Influenza A by PCR NEGATIVE NEGATIVE Final   Influenza B by PCR NEGATIVE NEGATIVE Final    Comment: (NOTE) The Xpert Xpress SARS-CoV-2/FLU/RSV plus assay is intended as an aid in the diagnosis of influenza from Nasopharyngeal swab specimens and should not be used as a sole basis for treatment. Nasal washings and aspirates are unacceptable for Xpert Xpress SARS-CoV-2/FLU/RSV testing.  Fact Sheet for Patients: EntrepreneurPulse.com.au  Fact Sheet for Healthcare Providers: IncredibleEmployment.be  This test is not yet approved or cleared by the [place] FDA and has been authorized for detection and/or diagnosis of SARS-CoV-2 by FDA under an Emergency Use Authorization (EUA). This EUA will remain in effect (meaning this test can be used) for the duration of the COVID-19 declaration under  Section 564(b)(1) of the Act, 21 U.S.C. section 360bbb-3(b)(1), unless the authorization is terminated or revoked.  Performed at [facility], [address] [place], [place] [address]     IMAGING RESULTS: CT head and neck normal I have personally reviewed the films ? Impression/Recommendation ?Admitted after syncopal episode-  CT head okay Low grade fever started after 4 days of admission  Leucocytosis after 4 days Pt was worked up with negative blood culture,negative urin culture, neg cxr with no pneumonia Wbc has improved Has mild transaminitis and increase in alkaline phosphatase- recommend US liver/GB Observe off antibiotics as pt does not look septic  Rt wrist tenderness with swelling R/o Fracture, septic arthritis R/o acute gouty arthritis or pseudogout, r/o other rheumatological condition Check uric acid, crp , esr Would recommend aspiration of the joint Agree with  holding off antibiotics until we have a better picture or if her condition changes  Encephalopathy due to Opioids , muscle relaxants- much improved as per staff after stopping all meds  Discussed with patient, requesting provider Note:  This document was prepared using Dragon voice recognition software and may include unintentional dictation errors.

## 2020-12-17 NOTE — Progress Notes (Signed)
PROGRESS NOTE    HPI was taken from Dr. Para Marchuncan: [name] is a 87 y.o. female with medical history significant for DM, HTN, CAD, CKD 3B with history of stent placement, prior admission in 2021 for syncope related to beta-blocker induced bradycardia who presents to the emergency room following a syncopal episode.  History is limited as patient has no recollection of the event.  States she last remembers sitting at the dining table about to have a meal and she remembers feeling like she was about to pass out and the next thing she remembered was being in the hospital.  Does not remember the ambulance ride clearly.  She did state that the day prior she forgot to take her morning medications and took them at 11:30 that night, and on the day of arrival instead of taking the a.m. medications in the morning a she took them at 5:30 the evening while sitting at the dining room table. She complains of headache to the back of her head but denies any other complaints.  Said she was in her usual state of health prior to the event.  She denies recent illness.  Denies recent nausea, vomiting, diarrhea or abdominal pain.  Denies recent cough, shortness of breath or chest pain and denies recent headaches, visual disturbance, one-sided numbness weakness or tingling.  Denies palpitations.    Hospital course from Dr. Mayford KnifeWilliams 8/24-8/30/22: Pt presented after syncopal episode of unknown etiology. W/ hx of prior syncopal event related to medication & has hx of carotid stenosis. Takes multiple anti-HTN meds including amlodipine, clonidine, imdur, aldactone, torsemide. Echo shows EF 55-60%, normal diastolic function & no regional wall motion abnormalities. Furthermore, pt has been c/o pain over entire body and infectious work-up so far has been neg. WBC is elevated but trending down but pt is spiking fevers. CXR was neg, urine cx neg, blood cxs neg and repeat COVID19 & influenza tests were neg. ID consulted today.   Mary HawthorneJanice  S Sloan  ZOX:[id number] DOB: 07/14/1930 DOA: 12/10/2020 PCP: Danella PentonMiller, Mark F, MD   Assessment & Plan:   Active Problems:   Carotid stenosis   Essential hypertension   CAD (coronary artery disease)   Type 2 diabetes mellitus (HCC)   Syncope and collapse   Syncope   Syncope: etiology unclear. W/ hx of prior syncopal event related to medication & has hx of carotid stenosis. Takes multiple anti-HTN meds including amlodipine, clonidine, imdur, aldactone, torsemide. Echo shows EF 55-60%, normal diastolic function & no regional wall motion abnormalities. Continue on home dose of amlodipine, aldactone, & imdur   Neck pain: acute on chronic. No mention of neck pain today. CT neck on admission showed No acute fracture or subluxation of the cervical spine. Stable degenerative disc disease and facet hypertrophy.  Leukocytosis: etiology unclear, continues to trend down. Still spiking fevers. Blood cxs NGTD, urine cx no growth, CXR neg for pneumonia, & repeat COVID19 & influenza are both neg. ID consulted  Encephalopathy: etiology unclear, metabolic vs toxic. Infectious work-up so far has been neg. CT head on admission was neg for acute intracranial findings. Narcotics/muscle relaxants were d/c 8/28 likely contributing encephalopathy. Started tramadol today as tylenol, lidocaine was not controlling pt's pain. Much improved today, AA&O x 3.   Hx of b/l carotid stenosis: carotid US shows b/l carotid stenosis & present at least since 2014.   HTN: continue on imdur, aldactone, amlodipine & restart torsemide if Cr is trending down    Hx of CAD: no chest pain.  Continue on imdur, aldactone   DM2: HbA1c 7.6, poorly controlled. Continue on SSI w/ accuchecks  poorly controlled, HbA1c 7.6. Continue on SSI w/ accuchecks   Likely CKD: baseline Cr/GFR is unknown, currently IIIb. Cr is trending up from day prior    DVT prophylaxis: lovenox  Code Status: full  Family Communication: discussed pt's care w/ pt's son,  [name] and answered his questions Disposition Plan: possibly to SNF   Level of care: Progressive Cardiac  Status is: Inpatient  Remains inpatient appropriate because:IV treatments appropriate due to intensity of illness or inability to take PO and Inpatient level of care appropriate due to severity of illness. Still spiking fevers  Dispo: The patient is from: Home              Anticipated d/c is to: SNF              Patient currently is not medically stable to d/c.   Difficult to place patient: unclear     Consultants:  ID  Procedures:   Antimicrobials:  Subjective: Pt c/o right wrist pain   Objective: Vitals:   12/16/20 2341 12/17/20 0103 12/17/20 0412 12/17/20 0819  BP: (!) 129/53  (!) 165/49 (!) 155/69  Pulse: 73  73 76  Resp: 16  16 17   Temp: (!) 100.4 F (38 C)  98.9 F (37.2 C) 97.8 F (36.6 C)  TempSrc:      SpO2: 96%  100% 99%  Weight:  58.9 kg    Height:        Intake/Output Summary (Last 24 hours) at 12/17/2020 0840 Last data filed at 12/17/2020 0451 Gross per 24 hour  Intake 120 ml  Output 400 ml  Net -280 ml    Filed Weights   12/15/20 0546 12/16/20 0509 12/17/20 0103  Weight: 59 kg 58.3 kg 58.9 kg    Examination:  General exam: appears calm but uncomfortable   Respiratory system: clear breath sounds b/l  Cardiovascular system: S1 & S2+. No rubs or clicks  Gastrointestinal system: Abd is soft, NT,ND & normal bowel sounds Central nervous system: Awake and oriented. Moves all extremities  Psychiatry: Judgement and insight appear impaired. Flat mood and affect    Data Reviewed: I have personally reviewed following labs and imaging studies  CBC: Recent Labs  Lab 12/10/20 2040 12/12/20 0457 12/13/20 0503 12/14/20 0547 12/15/20 0449 12/16/20 0551 12/17/20 0522  WBC 10.8*   < > 12.6* 18.2* 15.8* 14.4* 13.0*  NEUTROABS 5.9  --   --   --   --   --   --   HGB 12.4   < > 13.1 12.8 11.6* 11.7* 11.1*  HCT 35.2*   < > 38.7 36.9 32.5*  33.5* 31.8*  MCV 92.9   < > 94.2 96.1 94.8 92.3 93.8  PLT 349   < > 361 324 332 370 362   < > = values in this interval not displayed.   Basic Metabolic Panel: Recent Labs  Lab 12/13/20 0503 12/14/20 0547 12/15/20 0449 12/16/20 0551 12/17/20 0522  NA 135 135 132* 132* 134*  K 4.2 4.2 4.4 4.1 4.3  CL 102 100 97* 98 101  CO2 26 23 26 23 25   GLUCOSE 159* 167* 165* 156* 177*  BUN 19 27* 35* 31* 43*  CREATININE 1.20* 1.35* 1.30* 1.12* 1.28*  CALCIUM 9.3 9.7 9.6 10.0 9.9   GFR: Estimated Creatinine Clearance: 28.2 mL/min (A) (by C-G formula based on SCr of 1.28 mg/dL (H)). Liver Function  Tests: Recent Labs  Lab 12/10/20 2338 12/17/20 0522  AST 17 85*  ALT 12 77*  ALKPHOS 85 163*  BILITOT 0.8 0.7  PROT 7.1 6.8  ALBUMIN 3.8 2.6*   No results for input(s): LIPASE, AMYLASE in the last 168 hours. Recent Labs  Lab 12/17/20 0522  AMMONIA <9*   Coagulation Profile: No results for input(s): INR, PROTIME in the last 168 hours. Cardiac Enzymes: No results for input(s): CKTOTAL, CKMB, CKMBINDEX, TROPONINI in the last 168 hours. BNP (last 3 results) No results for input(s): PROBNP in the last 8760 hours. HbA1C: No results for input(s): HGBA1C in the last 72 hours.  CBG: Recent Labs  Lab 12/16/20 1148 12/16/20 1705 12/16/20 2140 12/17/20 0410 12/17/20 0818  GLUCAP 165* 234* 179* 174* 174*   Lipid Profile: No results for input(s): CHOL, HDL, LDLCALC, TRIG, CHOLHDL, LDLDIRECT in the last 72 hours. Thyroid Function Tests: No results for input(s): TSH, T4TOTAL, FREET4, T3FREE, THYROIDAB in the last 72 hours. Anemia Panel: No results for input(s): VITAMINB12, FOLATE, FERRITIN, TIBC, IRON, RETICCTPCT in the last 72 hours. Sepsis Labs: No results for input(s): PROCALCITON, LATICACIDVEN in the last 168 hours.  Recent Results (from the past 240 hour(s))  Resp Panel by RT-PCR (Flu A&B, Covid) Nasopharyngeal Swab     Status: None   Collection Time: 12/10/20  9:46 PM    Specimen: Nasopharyngeal Swab; Nasopharyngeal(NP) swabs in vial transport medium  Result Value Ref Range Status   SARS Coronavirus 2 by RT PCR NEGATIVE NEGATIVE Final    Comment: (NOTE) SARS-CoV-2 target nucleic acids are NOT DETECTED.  The SARS-CoV-2 RNA is generally detectable in upper respiratory specimens during the acute phase of infection. The lowest concentration of SARS-CoV-2 viral copies this assay can detect is 138 copies/mL. A negative result does not preclude SARS-Cov-2 infection and should not be used as the sole basis for treatment or other patient management decisions. A negative result may occur with  improper specimen collection/handling, submission of specimen other than nasopharyngeal swab, presence of viral mutation(s) within the areas targeted by this assay, and inadequate number of viral copies(<138 copies/mL). A negative result must be combined with clinical observations, patient history, and epidemiological information. The expected result is Negative.  Fact Sheet for Patients:  BloggerCourse.com  Fact Sheet for Healthcare Providers:  SeriousBroker.it  This test is no t yet approved or cleared by the [place] FDA and  has been authorized for detection and/or diagnosis of SARS-CoV-2 by FDA under an Emergency Use Authorization (EUA). This EUA will remain  in effect (meaning this test can be used) for the duration of the COVID-19 declaration under Section 564(b)(1) of the Act, 21 U.S.C.section 360bbb-3(b)(1), unless the authorization is terminated  or revoked sooner.       Influenza A by PCR NEGATIVE NEGATIVE Final   Influenza B by PCR NEGATIVE NEGATIVE Final    Comment: (NOTE) The Xpert Xpress SARS-CoV-2/FLU/RSV plus assay is intended as an aid in the diagnosis of influenza from Nasopharyngeal swab specimens and should not be used as a sole basis for treatment. Nasal washings and aspirates are  unacceptable for Xpert Xpress SARS-CoV-2/FLU/RSV testing.  Fact Sheet for Patients: BloggerCourse.com  Fact Sheet for Healthcare Providers: SeriousBroker.it  This test is not yet approved or cleared by the [place] FDA and has been authorized for detection and/or diagnosis of SARS-CoV-2 by FDA under an Emergency Use Authorization (EUA). This EUA will remain in effect (meaning this test can be used) for the duration of the  COVID-19 declaration under Section 564(b)(1) of the Act, 21 U.S.C. section 360bbb-3(b)(1), unless the authorization is terminated or revoked.  Performed at [facility], [address], [place], [place] [address]   Urine Culture     Status: None   Collection Time: 12/14/20  3:15 PM   Specimen: Urine, Random  Result Value Ref Range Status   Specimen Description   Final    URINE, RANDOM Performed at [facility], [address] [place], [place] [address]    Special Requests   Final    NONE Performed at [facility], [address] [place], [place] [address]    Culture   Final    NO GROWTH Performed at [facility], [address], [place], [place] [address]    Report Status 12/15/2020 FINAL  Final  CULTURE, BLOOD (ROUTINE X 2) w Reflex to ID Panel     Status: None (Preliminary result)   Collection Time: 12/16/20  8:17 AM   Specimen: BLOOD  Result Value Ref Range Status   Specimen Description BLOOD RIGHT [facility]  Final   Special Requests   Final    BOTTLES DRAWN AEROBIC AND ANAEROBIC Blood Culture adequate volume   Culture   Final    NO GROWTH < 24 HOURS Performed at [facility], [address] [place], [place] [address]    Report Status PENDING  Incomplete  CULTURE, BLOOD (ROUTINE X 2) w Reflex to ID Panel     Status: None (Preliminary result)   Collection Time: 12/16/20  8:27 AM   Specimen: BLOOD  Result Value Ref Range Status   Specimen Description  BLOOD RIGHT WRIST  Final   Special Requests   Final    BOTTLES DRAWN AEROBIC AND ANAEROBIC Blood Culture results may not be optimal due to an inadequate volume of blood received in culture bottles   Culture   Final    NO GROWTH < 24 HOURS Performed at [facility], [address], [place], [place] [address]    Report Status PENDING  Incomplete  Resp Panel by RT-PCR (Flu A&B, Covid) Nasopharyngeal Swab     Status: None   Collection Time: 12/16/20  9:44 AM   Specimen: Nasopharyngeal Swab; Nasopharyngeal(NP) swabs in vial transport medium  Result Value Ref Range Status   SARS Coronavirus 2 by RT PCR NEGATIVE NEGATIVE Final    Comment: (NOTE) SARS-CoV-2 target nucleic acids are NOT DETECTED.  The SARS-CoV-2 RNA is generally detectable in upper respiratory specimens during the acute phase of infection. The lowest concentration of SARS-CoV-2 viral copies this assay can detect is 138 copies/mL. A negative result does not preclude SARS-Cov-2 infection and should not be used as the sole basis for treatment or other patient management decisions. A negative result may occur with  improper specimen collection/handling, submission of specimen other than nasopharyngeal swab, presence of viral mutation(s) within the areas targeted by this assay, and inadequate number of viral copies(<138 copies/mL). A negative result must be combined with clinical observations, patient history, and epidemiological information. The expected result is Negative.  Fact Sheet for Patients:  BloggerCourse.com  Fact Sheet for Healthcare Providers:  SeriousBroker.it  This test is no t yet approved or cleared by the [place] FDA and  has been authorized for detection and/or diagnosis of SARS-CoV-2 by FDA under an Emergency Use Authorization (EUA). This EUA will remain  in effect (meaning this test can be used) for the duration of the COVID-19 declaration  under  Section 564(b)(1) of the Act, 21 U.S.C.section 360bbb-3(b)(1), unless the authorization is terminated  or revoked sooner.       Influenza A by PCR NEGATIVE NEGATIVE Final   Influenza B by PCR NEGATIVE NEGATIVE Final    Comment: (NOTE) The Xpert Xpress SARS-CoV-2/FLU/RSV plus assay is intended as an aid in the diagnosis of influenza from Nasopharyngeal swab specimens and should not be used as a sole basis for treatment. Nasal washings and aspirates are unacceptable for Xpert Xpress SARS-CoV-2/FLU/RSV testing.  Fact Sheet for Patients: BloggerCourse.com  Fact Sheet for Healthcare Providers: SeriousBroker.it  This test is not yet approved or cleared by the [place] FDA and has been authorized for detection and/or diagnosis of SARS-CoV-2 by FDA under an Emergency Use Authorization (EUA). This EUA will remain in effect (meaning this test can be used) for the duration of the COVID-19 declaration under Section 564(b)(1) of the Act, 21 U.S.C. section 360bbb-3(b)(1), unless the authorization is terminated or revoked.  Performed at [facility], [address], [place], [place] [address]          Radiology Studies: DG Hand 2 View Left  Result Date: 12/15/2020 CLINICAL DATA:  Pain after fall. EXAM: LEFT HAND - 2 VIEW COMPARISON:  March 15, 2019 FINDINGS: There is no evidence of fracture or dislocation. There is no evidence of arthropathy or other focal bone abnormality. Soft tissues are unremarkable. IMPRESSION: Negative. Electronically Signed   By: Gerome Sam III M.D.   On: 12/15/2020 20:26   DG HIP UNILAT WITH PELVIS 1V LEFT  Result Date: 12/15/2020 CLINICAL DATA:  Pain after fall EXAM: DG HIP (WITH OR WITHOUT PELVIS) 1V*L* COMPARISON:  None. FINDINGS: Vascular calcifications.  No fracture or dislocation. IMPRESSION: Negative. Electronically Signed   By: Gerome Sam III M.D.   On: 12/15/2020 20:27         Scheduled Meds:  amLODipine  5 mg Oral Daily   Chlorhexidine Gluconate Cloth  6 each Topical Daily   clopidogrel  75 mg Oral Daily   docusate sodium  200 mg Oral BID   enoxaparin (LOVENOX) injection  30 mg Subcutaneous Q24H   feeding supplement (GLUCERNA SHAKE)  237 mL Oral TID   fluticasone  1 spray Each Nare Daily   insulin aspart  0-15 Units Subcutaneous TID WC   insulin aspart  0-5 Units Subcutaneous QHS   isosorbide mononitrate  60 mg Oral Daily   polyethylene glycol  17 g Oral Daily   rosuvastatin  20 mg Oral Daily   sodium chloride flush  3 mL Intravenous Q12H   spironolactone  25 mg Oral Daily   timolol  1 drop Both Eyes QHS   vitamin B-12  100 mcg Oral Daily   Continuous Infusions:   LOS: 4 days    Time spent: 33 mins     Charise Killian, MD [facility] Pager [phone]  If 7PM-7AM, please contact night-coverage 12/17/2020, 8:40 AM

## 2020-12-17 NOTE — Progress Notes (Addendum)
Patient crying out in pain, however she does not want to take morphine.  Would like to try something not as strong.  Tylenol is not working.  Paged MD

## 2020-12-18 ENCOUNTER — Inpatient Hospital Stay: Payer: Medicare Other

## 2020-12-18 DIAGNOSIS — R55 Syncope and collapse: Secondary | ICD-10-CM

## 2020-12-18 DIAGNOSIS — I25118 Atherosclerotic heart disease of native coronary artery with other forms of angina pectoris: Secondary | ICD-10-CM

## 2020-12-18 DIAGNOSIS — M25531 Pain in right wrist: Secondary | ICD-10-CM

## 2020-12-18 DIAGNOSIS — I6523 Occlusion and stenosis of bilateral carotid arteries: Secondary | ICD-10-CM

## 2020-12-18 LAB — CBC
HCT: 30.5 % — ABNORMAL LOW (ref 36.0–46.0)
Hemoglobin: 10.4 g/dL — ABNORMAL LOW (ref 12.0–15.0)
MCH: 31.7 pg (ref 26.0–34.0)
MCHC: 34.1 g/dL (ref 30.0–36.0)
MCV: 93 fL (ref 80.0–100.0)
Platelets: 410 10*3/uL — ABNORMAL HIGH (ref 150–400)
RBC: 3.28 MIL/uL — ABNORMAL LOW (ref 3.87–5.11)
RDW: 13.3 % (ref 11.5–15.5)
WBC: 12.8 10*3/uL — ABNORMAL HIGH (ref 4.0–10.5)
nRBC: 0 % (ref 0.0–0.2)

## 2020-12-18 LAB — COMPREHENSIVE METABOLIC PANEL
ALT: 136 U/L — ABNORMAL HIGH (ref 0–44)
AST: 139 U/L — ABNORMAL HIGH (ref 15–41)
Albumin: 2.5 g/dL — ABNORMAL LOW (ref 3.5–5.0)
Alkaline Phosphatase: 223 U/L — ABNORMAL HIGH (ref 38–126)
Anion gap: 9 (ref 5–15)
BUN: 46 mg/dL — ABNORMAL HIGH (ref 8–23)
CO2: 26 mmol/L (ref 22–32)
Calcium: 10 mg/dL (ref 8.9–10.3)
Chloride: 101 mmol/L (ref 98–111)
Creatinine, Ser: 1.21 mg/dL — ABNORMAL HIGH (ref 0.44–1.00)
GFR, Estimated: 43 mL/min — ABNORMAL LOW (ref >60)
Glucose, Bld: 155 mg/dL — ABNORMAL HIGH (ref 70–99)
Potassium: 4.8 mmol/L (ref 3.5–5.1)
Sodium: 136 mmol/L (ref 135–145)
Total Bilirubin: 0.5 mg/dL (ref 0.3–1.2)
Total Protein: 7.1 g/dL (ref 6.5–8.1)

## 2020-12-18 LAB — SEDIMENTATION RATE: Sed Rate: 127 mm/hr — ABNORMAL HIGH (ref 0–30)

## 2020-12-18 LAB — HEPATITIS PANEL, ACUTE
HCV Ab: NONREACTIVE
Hep A IgM: NONREACTIVE
Hep B C IgM: NONREACTIVE
Hepatitis B Surface Ag: NONREACTIVE

## 2020-12-18 LAB — PROCALCITONIN: Procalcitonin: 1.08 ng/mL

## 2020-12-18 LAB — GLUCOSE, CAPILLARY
Glucose-Capillary: 158 mg/dL — ABNORMAL HIGH (ref 70–99)
Glucose-Capillary: 162 mg/dL — ABNORMAL HIGH (ref 70–99)
Glucose-Capillary: 124 mg/dL — ABNORMAL HIGH (ref 70–99)
Glucose-Capillary: 238 mg/dL — ABNORMAL HIGH (ref 70–99)
Glucose-Capillary: 206 mg/dL — ABNORMAL HIGH (ref 70–99)

## 2020-12-18 LAB — URIC ACID: Uric Acid, Serum: 6.5 mg/dL (ref 2.5–7.1)

## 2020-12-18 LAB — C-REACTIVE PROTEIN: CRP: 25.6 mg/dL — ABNORMAL HIGH (ref <1.0)

## 2020-12-18 NOTE — Progress Notes (Signed)
Date of Admission:  12/10/2020     ID: [name] is a 87 y.o. female  Active Problems:   Carotid stenosis   Essential hypertension   CAD (coronary artery disease)   Type 2 diabetes mellitus (HCC)   Syncope and collapse   Syncope    Subjective: Sons at bed side They say she is a little confused As per them she had passed out at home while standing and fell back At baseline she walks with cane/walker and is alert and not confused She has underlying neck pain and low back pain. She sleeps in couch at home with neck hanging forward Pt says her neck hurt, rt wrist hurt  Medications:   amLODipine  5 mg Oral Daily   Chlorhexidine Gluconate Cloth  6 each Topical Daily   clopidogrel  75 mg Oral Daily   docusate sodium  200 mg Oral BID   enoxaparin (LOVENOX) injection  30 mg Subcutaneous Q24H   feeding supplement (GLUCERNA SHAKE)  237 mL Oral TID   fluticasone  1 spray Each Nare Daily   insulin aspart  0-15 Units Subcutaneous TID WC   insulin aspart  0-5 Units Subcutaneous QHS   isosorbide mononitrate  60 mg Oral Daily   polyethylene glycol  17 g Oral Daily   sodium chloride flush  3 mL Intravenous Q12H   timolol  1 drop Both Eyes QHS   vitamin B-12  100 mcg Oral Daily    Objective: Vital signs in last 24 hours: Patient Vitals for the past 24 hrs:  BP Temp Temp src Pulse Resp SpO2  12/18/20 1610 (!) 180/75 98.5 F (36.9 C) -- 99 -- 97 %  12/18/20 1107 132/89 97.7 F (36.5 C) Oral 77 -- 99 %  12/18/20 0734 (!) 162/55 98.6 F (37 C) -- 80 18 96 %  12/18/20 0359 (!) 158/58 99.2 F (37.3 C) -- 79 18 96 %  12/18/20 0010 (!) 151/53 99.4 F (37.4 C) -- 75 16 95 %  12/17/20 1926 (!) 153/46 (!) 97.5 F (36.4 C) -- 83 18 96 %    PHYSICAL EXAM:  General: somnolent , but  cooperative, has some distress, oriented X 4 Responds to questions appropriately- intermittently confused  Head: Normocephalic, without obvious abnormality, atraumatic. Eyes: Conjunctivae clear, anicteric  sclerae. Pupils are equal ENT Nares normal. No drainage or sinus tenderness. Lips, mucosa, and tongue normal. No Thrush Neck: did not check for stiffness ebcause of pain  no carotid bruit and no JVD. Lungs: Clear to auscultation bilaterally. No Wheezing or Rhonchi. No rales. Heart: Regular rate and rhythm, no murmur, rub or gallop. Abdomen: Soft, non-tender,not distended. Bowel sounds normal. No masses Extremities: rt wrist, less swelling, tender, erythema Skin: No rashes or lesions. Or bruising Lymph: Cervical, supraclavicular normal. Neurologic: Grossly non-focal  Lab Results Recent Labs    12/17/20 0522 12/18/20 0501  WBC 13.0* 12.8*  HGB 11.1* 10.4*  HCT 31.8* 30.5*  NA 134* 136  K 4.3 4.8  CL 101 101  CO2 25 26  BUN 43* 46*  CREATININE 1.28* 1.21*   Liver Panel Recent Labs    12/17/20 0522 12/18/20 0501  PROT 6.8 7.1  ALBUMIN 2.6* 2.5*  AST 85* 139*  ALT 77* 136*  ALKPHOS 163* 223*  BILITOT 0.7 0.5   Sedimentation Rate Recent Labs    12/18/20 0501  ESRSEDRATE 127*   C-Reactive Protein Recent Labs    12/18/20 0503  CRP 25.6*    Microbiology:  Studies/Results: DG Wrist Complete Right  Result Date: 12/17/2020 CLINICAL DATA:  RIGHT wrist pain. EXAM: RIGHT WRIST - COMPLETE 3+ VIEW COMPARISON:  None FINDINGS: Degenerative changes throughout the wrist. Most pronounced in the first carpometacarpal joint. No sign of acute fracture. No dislocation. No substantial soft tissue swelling. IMPRESSION: Degenerative changes, no acute findings. Electronically Signed   By: Zetta Bills M.D.   On: 12/17/2020 14:02   US Abdomen Limited RUQ (LIVER/GB)  Result Date: 12/18/2020 CLINICAL DATA:  Abnormal LFTs. EXAM: ULTRASOUND ABDOMEN LIMITED RIGHT UPPER QUADRANT COMPARISON:  None. FINDINGS: Gallbladder: No gallstones or wall thickening visualized. No sonographic Murphy sign noted by sonographer. Common bile duct: Diameter: 2 mm Liver: No focal lesion identified. Within  normal limits in parenchymal echogenicity. Portal vein is patent on color Doppler imaging with normal direction of blood flow towards the liver. Other: None. IMPRESSION: Unremarkable study. No findings to explain the history of abnormal LFTs. Electronically Signed   By: Misty Stanley M.D.   On: 12/18/2020 10:21     Assessment/Plan:  Syncope and fall- recurrent- likely from meds  Right wrist, neck , back pain also has pain on various joints Son says the rt wrist pain could be after fall- xray no fracture- may need MRI Swelling less- low grade fever and low level leucocytosis- septic joint possible but less likely Gout/pseudo gout need to in the D.D  Has high ESR- other rheumatological condition like rheumatoid arthritis les slikely Recommne drheumatology consult Hold off antibiotics as stable and no evidence of UTI/pneumonia, bacteremia  Flyctuating encephalopathy- likely due to pain meds/muscle relaxants  Discussed the management with her sons

## 2020-12-18 NOTE — Progress Notes (Signed)
[facility] Daily Progress Note  Consult received for carotid stenosis. Reviewed carotid duplex. Patient with some disease however not severe. Will order a MRA of the neck and head in an attempt to review the patient's anatomy as well as exact contributing degree of atherosclerotic disease. Full consult pending MRI results.  Discussed with Dr. Charlie Pitter Uriel Horkey PA-C 12/18/2020 4:03 PM

## 2020-12-18 NOTE — Progress Notes (Signed)
Occupational Therapy Treatment Patient Details Name: [name] MRN: [id number] DOB: Jan 09, 1931 Today's Date: 12/18/2020    History of present illness 87 year old female with past medical history noted for DM, HTN, CAD, CKD 3B with history of stent placement, and prior admission in 2021 for syncope.  Patient presented with syncope on 12/10/2020 and fall.   OT comments  Based on review of previous therapy notes, [name] presents today better oriented than earlier this week. She was unable to name the month/season, but, other than that, showed little confusion: was fully aware of the situation, able to accurately describe her prior living arrangements, appropriate in conversation, etc. She continues to be, however, very weak and with highly limited fxl mobility. She required MaxA +2 to maneuver supine<sit, and Max A to maintain sitting balance, MaxA +2 for transferring sit<stand, maintaining standing balance, and pivoting to recliner. Pt reports 7/10 pain in R wrist, increased with any WB, and with very limited ROM in wrist (~25 flexion/extension), and inability to flex digits/form fist. Able to self-feed using L UE, but required assistance for opening packaging. DC to SNF remains appropriate recommendation.   Follow Up Recommendations  SNF    Equipment Recommendations       Recommendations for Other Services      Precautions / Restrictions Precautions Precautions: Fall Restrictions Weight Bearing Restrictions: No Other Position/Activity Restrictions: Pt reports pain with any WB through R wrist       Mobility Bed Mobility Overal bed mobility: Needs Assistance Bed Mobility: Supine to Sit     Supine to sit: +2 for physical assistance;Max assist;HOB elevated          Transfers Overall transfer level: Needs assistance Equipment used: Rolling walker (2 wheeled) Transfers: Stand Pivot Transfers;Sit to/from Stand Sit to Stand: Max assist;+2 physical assistance Stand pivot  transfers: Max assist;+2 physical assistance            Balance Overall balance assessment: Needs assistance Sitting-balance support: Feet unsupported;Bilateral upper extremity supported Sitting balance-Leahy Scale: Poor Sitting balance - Comments: Required MaxA to maintain upright sitting posture Postural control: Posterior lean;Left lateral lean   Standing balance-Leahy Scale: Poor Standing balance comment: max assist 2/2 posterior lean with inability to shift pelvis anteriorly & upright trunk                           ADL either performed or assessed with clinical judgement   ADL Overall ADL's : Needs assistance/impaired                                       General ADL Comments: Max A for LBD seated EOB.     Vision       Perception     Praxis      Cognition Arousal/Alertness: Awake/alert Behavior During Therapy: WFL for tasks assessed/performed Overall Cognitive Status: No family/caregiver present to determine baseline cognitive functioning                                 General Comments: Oriented to self, place, situation. Could correctly ID year, but not month/season. Thought it was nighttime, even though the room was flooded with sunshine.        Exercises Other Exercises Other Exercises: Sup<sit, LBD, transfers, sitting/standing balance tolerance, grooming   Shoulder Instructions  General Comments      Pertinent Vitals/ Pain       Pain Score: 7  Pain Location: R hand Pain Descriptors / Indicators: Aching Pain Intervention(s): Limited activity within patient's tolerance;Monitored during session;Repositioned  Home Living                                          Prior Functioning/Environment              Frequency  Min 2X/week        Progress Toward Goals  OT Goals(current goals can now be found in the care plan section)  Progress towards OT goals: Progressing toward  goals  Acute Rehab OT Goals Patient Stated Goal: to not be "such a baby" OT Goal Formulation: With patient/family Time For Goal Achievement: 12/26/20 Potential to Achieve Goals: Good  Plan Discharge plan remains appropriate    Co-evaluation                 AM-PAC OT "6 Clicks" Daily Activity     Outcome Measure   Help from another person eating meals?: None Help from another person taking care of personal grooming?: A Lot Help from another person toileting, which includes using toliet, bedpan, or urinal?: A Lot Help from another person bathing (including washing, rinsing, drying)?: A Lot Help from another person to put on and taking off regular upper body clothing?: A Lot Help from another person to put on and taking off regular lower body clothing?: A Lot 6 Click Score: 14    End of Session Equipment Utilized During Treatment: Rolling walker;Gait belt  OT Visit Diagnosis: Other abnormalities of gait and mobility (R26.89);Muscle weakness (generalized) (M62.81)   Activity Tolerance Patient limited by pain   Patient Left in chair;with call bell/phone within reach;with chair alarm set   Nurse Communication          Time: [phone] OT Time Calculation (min): 39 min  Charges: OT General Charges $OT Visit: 1 Visit OT Treatments $Self Care/Home Management : 38-52 mins  Latina Craver, PhD, MS, OTR/L 12/18/20, 10:56 AM

## 2020-12-18 NOTE — Progress Notes (Signed)
PROGRESS NOTE    HPI was taken from Dr. Damita Dunnings: [name] is a 87 y.o. female with medical history significant for DM, HTN, CAD, CKD 3B with history of stent placement, prior admission in 2021 for syncope related to beta-blocker induced bradycardia who presents to the emergency room following a syncopal episode.  History is limited as patient has no recollection of the event.  States she last remembers sitting at the dining table about to have a meal and she remembers feeling like she was about to pass out and the next thing she remembered was being in the hospital.  Does not remember the ambulance ride clearly.  She did state that the day prior she forgot to take her morning medications and took them at 11:30 that night, and on the day of arrival instead of taking the a.m. medications in the morning a she took them at 5:30 the evening while sitting at the dining room table. She complains of headache to the back of her head but denies any other complaints.  Said she was in her usual state of health prior to the event.  She denies recent illness.  Denies recent nausea, vomiting, diarrhea or abdominal pain.  Denies recent cough, shortness of breath or chest pain and denies recent headaches, visual disturbance, one-sided numbness weakness or tingling.  Denies palpitations.    Hospital course from Dr. Jimmye Norman 8/24-8/30/22: Pt presented after syncopal episode of unknown etiology. W/ hx of prior syncopal event related to medication & has hx of carotid stenosis. Takes multiple anti-HTN meds including amlodipine, clonidine, imdur, aldactone, torsemide. Echo shows EF 18-56%, normal diastolic function & no regional wall motion abnormalities. Furthermore, pt has been c/o pain over entire body and infectious work-up so far has been neg. WBC is elevated but trending down but pt is spiking fevers. CXR was neg, urine cx neg, blood cxs neg and repeat COVID19 & influenza tests were neg. ID consulted today.  8/31  feels rt hand is painful. No other complaints. Not in the mood to eat this am.  [name]  DJS:[id number] DOB: 1931/02/16 DOA: 12/10/2020 PCP: Mary Aus, MD   Assessment & Plan:   Active Problems:   Carotid stenosis   Essential hypertension   CAD (coronary artery disease)   Type 2 diabetes mellitus (HCC)   Syncope and collapse   Syncope   Syncope: etiology unclear. W/ hx of prior syncopal event related to medication & has hx of carotid stenosis. Takes multiple anti-HTN meds including amlodipine, clonidine, imdur, aldactone, torsemide. Echo shows EF 88-50%, normal diastolic function & no regional wall motion abnormalities.  8/31 her K is trending upward.  Off torsemide.  She is euvolemic on exam.  Will DC Aldactone  Continue Imdur and amlodipine.    Elevated LFT- Continues to trend up.  Abd [place] neg.  Will dc crestor Monitor, if continues going up will consult gi  Lt hand pain Fevers Leukocytosis: Blood cxs NGTD, urine cx no growth, CXR neg for pneumonia, & repeat COVID19 & influenza are both neg. ID consulted Wbc trending down. ESR up.  Id consulted hand  mildly painful , swollen.  Xr no fx. Crp up. Keep hands elevated and place ice pack    Neck pain: acute on chronic. No mention of neck pain today. CT neck on admission showed No acute fracture or subluxation of the cervical spine. Stable degenerative disc disease and facet hypertrophy.    Encephalopathy: etiology unclear, metabolic vs toxic. Infectious work-up so  far has been neg. CT head on admission was neg for acute intracranial findings. Narcotics/muscle relaxants were d/c 8/28 likely contributing encephalopathy. Started tramadol today as tylenol, lidocaine was not controlling pt's pain.  8/31 sleepy but answers questions today.    Hx of b/l carotid stenosis: carotid US shows b/l carotid stenosis & present at least since 2014.   HTN: continue on imdur, aldactone, amlodipine & restart torsemide if Cr is  trending down    Hx of CAD: no chest pain. Continue on imdur, aldactone   DM2: HbA1c 7.6, poorly controlled. Continue on SSI w/ accuchecks  poorly controlled, HbA1c 7.6. Continue on SSI w/ accuchecks   Likely CKD: baseline Cr/GFR is unknown, currently IIIb. Cr is trending up from day prior    DVT prophylaxis: lovenox  Code Status: full  Family Communication: discussed pt's care w/ pt's son, [name] and answered his questions Disposition Plan: possibly to SNF   Level of care: Progressive Cardiac  Status is: Inpatient  Remains inpatient appropriate because:IV treatments appropriate due to intensity of illness or inability to take PO and Inpatient level of care appropriate due to severity of illness. Still spiking fevers  Dispo: The patient is from: Home              Anticipated d/c is to: SNF              Patient currently is not medically stable to d/c.   Difficult to place patient: unclear     Consultants:  ID  Procedures:   Antimicrobials:  Subjective: C/o right hand pain. Sleepy no other issues or complaints. Not much po intake this am.   Objective: Vitals:   12/18/20 0010 12/18/20 0359 12/18/20 0734 12/18/20 1107  BP: (!) 151/53 (!) 158/58 (!) 162/55 132/89  Pulse: 75 79 80 77  Resp: _0 Temp: 99.4 F (37.4 C) 99.2 F (37.3 C) 98.6 F (37 C) 97.7 F (36.5 C)  TempSrc:    Oral  SpO2: 95% 96% 96% 99%  Weight:      Height:        Intake/Output Summary (Last 24 hours) at 12/18/2020 1459 Last data filed at 12/17/2020 2213 Gross per 24 hour  Intake 0 ml  Output 500 ml  Net -500 ml    Filed Weights   12/15/20 0546 12/16/20 0509 12/17/20 0103  Weight: 59 kg 58.3 kg 58.9 kg    Examination:  Nad, sleepy. Responds to questions Cta no w/r Regular s1/s2 no gallop Soft benign +bs  No edema right hand mildly swelling, warm to touch, mildly ttp, mild erythema.   Data Reviewed: I have personally reviewed following labs and imaging  studies  CBC: Recent Labs  Lab 12/14/20 0547 12/15/20 0449 12/16/20 0551 12/17/20 0522 12/18/20 0501  WBC 18.2* 15.8* 14.4* 13.0* 12.8*  HGB 12.8 11.6* 11.7* 11.1* 10.4*  HCT 36.9 32.5* 33.5* 31.8* 30.5*  MCV 96.1 94.8 92.3 93.8 93.0  PLT 324 332 370 362 244*   Basic Metabolic Panel: Recent Labs  Lab 12/14/20 0547 12/15/20 0449 12/16/20 0551 12/17/20 0522 12/18/20 0501  NA 135 132* 132* 134* 136  K 4.2 4.4 4.1 4.3 4.8  CL 100 97* 98 101 101  CO2 _1 GLUCOSE 167* 165* 156* 177* 155*  BUN 27* 35* 31* 43* 46*  CREATININE 1.35* 1.30* 1.12* 1.28* 1.21*  CALCIUM 9.7 9.6 10.0 9.9 10.0   GFR: Estimated Creatinine Clearance: 29.9 mL/min (A) (by C-G formula  based on SCr of 1.21 mg/dL (H)). Liver Function Tests: Recent Labs  Lab 12/17/20 0522 12/18/20 0501  AST 85* 139*  ALT 77* 136*  ALKPHOS 163* 223*  BILITOT 0.7 0.5  PROT 6.8 7.1  ALBUMIN 2.6* 2.5*   No results for input(s): LIPASE, AMYLASE in the last 168 hours. Recent Labs  Lab 12/17/20 0522  AMMONIA <9*   Coagulation Profile: No results for input(s): INR, PROTIME in the last 168 hours. Cardiac Enzymes: No results for input(s): CKTOTAL, CKMB, CKMBINDEX, TROPONINI in the last 168 hours. BNP (last 3 results) No results for input(s): PROBNP in the last 8760 hours. HbA1C: No results for input(s): HGBA1C in the last 72 hours.  CBG: Recent Labs  Lab 12/17/20 1619 12/17/20 1928 12/18/20 0504 12/18/20 0736 12/18/20 1109  GLUCAP 125* 159* 158* 162* 124*   Lipid Profile: No results for input(s): CHOL, HDL, LDLCALC, TRIG, CHOLHDL, LDLDIRECT in the last 72 hours. Thyroid Function Tests: No results for input(s): TSH, T4TOTAL, FREET4, T3FREE, THYROIDAB in the last 72 hours. Anemia Panel: No results for input(s): VITAMINB12, FOLATE, FERRITIN, TIBC, IRON, RETICCTPCT in the last 72 hours. Sepsis Labs: Recent Labs  Lab 12/18/20 0501  PROCALCITON 1.08    Recent Results (from the past 240  hour(s))  Resp Panel by RT-PCR (Flu A&B, Covid) Nasopharyngeal Swab     Status: None   Collection Time: 12/10/20  9:46 PM   Specimen: Nasopharyngeal Swab; Nasopharyngeal(NP) swabs in vial transport medium  Result Value Ref Range Status   SARS Coronavirus 2 by RT PCR NEGATIVE NEGATIVE Final    Comment: (NOTE) SARS-CoV-2 target nucleic acids are NOT DETECTED.  The SARS-CoV-2 RNA is generally detectable in upper respiratory specimens during the acute phase of infection. The lowest concentration of SARS-CoV-2 viral copies this assay can detect is 138 copies/mL. A negative result does not preclude SARS-Cov-2 infection and should not be used as the sole basis for treatment or other patient management decisions. A negative result may occur with  improper specimen collection/handling, submission of specimen other than nasopharyngeal swab, presence of viral mutation(s) within the areas targeted by this assay, and inadequate number of viral copies(<138 copies/mL). A negative result must be combined with clinical observations, patient history, and epidemiological information. The expected result is Negative.  Fact Sheet for Patients:  EntrepreneurPulse.com.au  Fact Sheet for Healthcare Providers:  IncredibleEmployment.be  This test is no t yet approved or cleared by the [place] FDA and  has been authorized for detection and/or diagnosis of SARS-CoV-2 by FDA under an Emergency Use Authorization (EUA). This EUA will remain  in effect (meaning this test can be used) for the duration of the COVID-19 declaration under Section 564(b)(1) of the Act, 21 U.S.C.section 360bbb-3(b)(1), unless the authorization is terminated  or revoked sooner.       Influenza A by PCR NEGATIVE NEGATIVE Final   Influenza B by PCR NEGATIVE NEGATIVE Final    Comment: (NOTE) The Xpert Xpress SARS-CoV-2/FLU/RSV plus assay is intended as an aid in the diagnosis of influenza from  Nasopharyngeal swab specimens and should not be used as a sole basis for treatment. Nasal washings and aspirates are unacceptable for Xpert Xpress SARS-CoV-2/FLU/RSV testing.  Fact Sheet for Patients: EntrepreneurPulse.com.au  Fact Sheet for Healthcare Providers: IncredibleEmployment.be  This test is not yet approved or cleared by the [place] FDA and has been authorized for detection and/or diagnosis of SARS-CoV-2 by FDA under an Emergency Use Authorization (EUA). This EUA will remain in effect (meaning this  test can be used) for the duration of the COVID-19 declaration under Section 564(b)(1) of the Act, 21 U.S.C. section 360bbb-3(b)(1), unless the authorization is terminated or revoked.  Performed at [facility], [address], [place], [place] [address]   Urine Culture     Status: None   Collection Time: 12/14/20  3:15 PM   Specimen: Urine, Random  Result Value Ref Range Status   Specimen Description   Final    URINE, RANDOM Performed at [facility], [address] [place], [place] [address]    Special Requests   Final    NONE Performed at [facility], [address], [place], [place] [address]    Culture   Final    NO GROWTH Performed at [facility], [place] [address] [place], [place] [address]    Report Status 12/15/2020 FINAL  Final  CULTURE, BLOOD (ROUTINE X 2) w Reflex to ID Panel     Status: None (Preliminary result)   Collection Time: 12/16/20  8:17 AM   Specimen: BLOOD  Result Value Ref Range Status   Specimen Description BLOOD RIGHT [facility]  Final   Special Requests   Final    BOTTLES DRAWN AEROBIC AND ANAEROBIC Blood Culture adequate volume   Culture   Final    NO GROWTH 2 DAYS Performed at [facility], [address], [place], [place] [address]    Report Status PENDING  Incomplete  CULTURE, BLOOD (ROUTINE X 2) w Reflex to ID Panel     Status: None (Preliminary  result)   Collection Time: 12/16/20  8:27 AM   Specimen: BLOOD  Result Value Ref Range Status   Specimen Description BLOOD RIGHT WRIST  Final   Special Requests   Final    BOTTLES DRAWN AEROBIC AND ANAEROBIC Blood Culture results may not be optimal due to an inadequate volume of blood received in culture bottles   Culture   Final    NO GROWTH 2 DAYS Performed at [facility], [address], [place], [place] [address]    Report Status PENDING  Incomplete  Resp Panel by RT-PCR (Flu A&B, Covid) Nasopharyngeal Swab     Status: None   Collection Time: 12/16/20  9:44 AM   Specimen: Nasopharyngeal Swab; Nasopharyngeal(NP) swabs in vial transport medium  Result Value Ref Range Status   SARS Coronavirus 2 by RT PCR NEGATIVE NEGATIVE Final    Comment: (NOTE) SARS-CoV-2 target nucleic acids are NOT DETECTED.  The SARS-CoV-2 RNA is generally detectable in upper respiratory specimens during the acute phase of infection. The lowest concentration of SARS-CoV-2 viral copies this assay can detect is 138 copies/mL. A negative result does not preclude SARS-Cov-2 infection and should not be used as the sole basis for treatment or other patient management decisions. A negative result may occur with  improper specimen collection/handling, submission of specimen other than nasopharyngeal swab, presence of viral mutation(s) within the areas targeted by this assay, and inadequate number of viral copies(<138 copies/mL). A negative result must be combined with clinical observations, patient history, and epidemiological information. The expected result is Negative.  Fact Sheet for Patients:  EntrepreneurPulse.com.au  Fact Sheet for Healthcare Providers:  IncredibleEmployment.be  This test is no t yet approved or cleared by the [place] FDA and  has been authorized for detection and/or diagnosis of SARS-CoV-2 by FDA under an Emergency Use Authorization  (EUA). This EUA will remain  in effect (meaning this test can be used) for  the duration of the COVID-19 declaration under Section 564(b)(1) of the Act, 21 U.S.C.section 360bbb-3(b)(1), unless the authorization is terminated  or revoked sooner.       Influenza A by PCR NEGATIVE NEGATIVE Final   Influenza B by PCR NEGATIVE NEGATIVE Final    Comment: (NOTE) The Xpert Xpress SARS-CoV-2/FLU/RSV plus assay is intended as an aid in the diagnosis of influenza from Nasopharyngeal swab specimens and should not be used as a sole basis for treatment. Nasal washings and aspirates are unacceptable for Xpert Xpress SARS-CoV-2/FLU/RSV testing.  Fact Sheet for Patients: EntrepreneurPulse.com.au  Fact Sheet for Healthcare Providers: IncredibleEmployment.be  This test is not yet approved or cleared by the [place] FDA and has been authorized for detection and/or diagnosis of SARS-CoV-2 by FDA under an Emergency Use Authorization (EUA). This EUA will remain in effect (meaning this test can be used) for the duration of the COVID-19 declaration under Section 564(b)(1) of the Act, 21 U.S.C. section 360bbb-3(b)(1), unless the authorization is terminated or revoked.  Performed at [facility], [address] [place], [place] [address]          Radiology Studies: DG Wrist Complete Right  Result Date: 12/17/2020 CLINICAL DATA:  RIGHT wrist pain. EXAM: RIGHT WRIST - COMPLETE 3+ VIEW COMPARISON:  None FINDINGS: Degenerative changes throughout the wrist. Most pronounced in the first carpometacarpal joint. No sign of acute fracture. No dislocation. No substantial soft tissue swelling. IMPRESSION: Degenerative changes, no acute findings. Electronically Signed   By: Zetta Bills M.D.   On: 12/17/2020 14:02   US Abdomen Limited RUQ (LIVER/GB)  Result Date: 12/18/2020 CLINICAL DATA:  Abnormal LFTs. EXAM: ULTRASOUND ABDOMEN LIMITED RIGHT UPPER QUADRANT  COMPARISON:  None. FINDINGS: Gallbladder: No gallstones or wall thickening visualized. No sonographic Murphy sign noted by sonographer. Common bile duct: Diameter: 2 mm Liver: No focal lesion identified. Within normal limits in parenchymal echogenicity. Portal vein is patent on color Doppler imaging with normal direction of blood flow towards the liver. Other: None. IMPRESSION: Unremarkable study. No findings to explain the history of abnormal LFTs. Electronically Signed   By: Misty Stanley M.D.   On: 12/18/2020 10:21        Scheduled Meds:  amLODipine  5 mg Oral Daily   Chlorhexidine Gluconate Cloth  6 each Topical Daily   clopidogrel  75 mg Oral Daily   docusate sodium  200 mg Oral BID   enoxaparin (LOVENOX) injection  30 mg Subcutaneous Q24H   feeding supplement (GLUCERNA SHAKE)  237 mL Oral TID   fluticasone  1 spray Each Nare Daily   insulin aspart  0-15 Units Subcutaneous TID WC   insulin aspart  0-5 Units Subcutaneous QHS   isosorbide mononitrate  60 mg Oral Daily   polyethylene glycol  17 g Oral Daily   sodium chloride flush  3 mL Intravenous Q12H   spironolactone  25 mg Oral Daily   timolol  1 drop Both Eyes QHS   vitamin B-12  100 mcg Oral Daily   Continuous Infusions:   LOS: 5 days    Time spent: 35 min with >50% on coc    Nolberto Hanlon, MD [facility] Pager [phone]  If 7PM-7AM, please contact night-coverage 12/18/2020, 2:59 PM

## 2020-12-18 NOTE — Progress Notes (Signed)
Physical Therapy Treatment Patient Details Name: [name] MRN: [id number] DOB: [birth year] Today's Date: 12/18/2020    History of Present Illness 87 year old female with past medical history noted for DM, HTN, CAD, CKD 3B with history of stent placement, and prior admission in 2021 for syncope.  Patient presented with syncope on 12/10/2020 and fall.    PT Comments    Pt slouched in recliner on arrival, but despite clearly being frustrated with her situation initially seemed willing to participate with some PT.  However multiple attempts at standing to walker were unsuccessful as pt was unable to coordinate any assist despite much cuing and positional set up.  Ultimately she reported needing to have a BM and it was clear that getting to the [facility] was not doable/safe.  Dependent squat-pivot transfer to the bed and assist to get on the bed pan.  Nursing notified, pt with general agitation, frustration and contrariness t/o the session and PT unable to redirect/reorient.     Follow Up Recommendations  SNF;Supervision/Assistance - 24 hour     Equipment Recommendations  None recommended by PT    Recommendations for Other Services       Precautions / Restrictions Precautions Precautions: Fall Restrictions Weight Bearing Restrictions: No    Mobility  Bed Mobility Overal bed mobility: Needs Assistance Bed Mobility: Sit to Supine       Sit to supine: Max assist   General bed mobility comments: Pt with little/no initiation of movement, agitated despite PT's attempts to gently manage moving this 87 y/o back to bed    Transfers Overall transfer level: Needs assistance   Transfers: Stand Pivot Transfers Sit to Stand: Total assist (attempted X 2, pt unable to assist at all, could not get LEs underneath her and ultimately never attained upright despite heavy assist and cuing) Stand pivot transfers: Total assist       General transfer comment: Pt requires max /total assist sit>stand  with max cuing to push to standing vs pulling on RW, ultimatley needed to complete stand pivot back to bed with total assist manual facilitation  Ambulation/Gait                 Stairs             Wheelchair Mobility    Modified Rankin (Stroke Patients Only)       Balance Overall balance assessment: Needs assistance Sitting-balance support: Feet unsupported;Bilateral upper extremity supported Sitting balance-Leahy Scale: Poor Sitting balance - Comments: Required MaxA to maintain upright sitting posture Postural control: Posterior lean Standing balance support: Bilateral upper extremity supported;During functional activity Standing balance-Leahy Scale: Zero                              Cognition Arousal/Alertness: Awake/alert Behavior During Therapy: Agitated;Restless Overall Cognitive Status: Difficult to assess                                 General Comments: repeatedly asks same questions and makes same comments t/o session.  Did at least know she was at the hospital but, despite cuing/eplanation, could not figure out why - consistently unable to reorient/redirect      Exercises      General Comments General comments (skin integrity, edema, etc.): Pt was able to stand and do some ambulation on eval ~5 days ago but has since struggled to participate much less  show functional mobility w/o max assist      Pertinent Vitals/Pain Pain Assessment: Faces Faces Pain Scale: Hurts little more Pain Location: repeats "my hands are sore" as well as "I'm sore all over"    Home Living                      Prior Function            PT Goals (current goals can now be found in the care plan section) Progress towards PT goals: Not progressing toward goals - comment (Pt with very limited ability to participate, clearly confused and frustrated by her situation)    Frequency    Min 2X/week      PT Plan Current plan remains  appropriate    Co-evaluation              AM-PAC PT "6 Clicks" Mobility   Outcome Measure  Help needed turning from your back to your side while in a flat bed without using bedrails?: Total Help needed moving from lying on your back to sitting on the side of a flat bed without using bedrails?: Total Help needed moving to and from a bed to a chair (including a wheelchair)?: Total Help needed standing up from a chair using your arms (e.g., wheelchair or bedside chair)?: Total Help needed to walk in hospital room?: Total Help needed climbing 3-5 steps with a railing? : Total 6 Click Score: 6    End of Session Equipment Utilized During Treatment: Gait belt Activity Tolerance:  (pt reports needing to have a BM, assisted onto bed pan) Patient left: with call bell/phone within reach;with bed alarm set Nurse Communication: Mobility status PT Visit Diagnosis: Unsteadiness on feet (R26.81);Muscle weakness (generalized) (M62.81);Difficulty in walking, not elsewhere classified (R26.2)     Time: 8115-7262 PT Time Calculation (min) (ACUTE ONLY): 15 min  Charges:  $Therapeutic Activity: 8-22 mins                     Malachi Pro, DPT 12/18/2020, 5:03 PM

## 2020-12-19 DIAGNOSIS — J189 Pneumonia, unspecified organism: Secondary | ICD-10-CM

## 2020-12-19 DIAGNOSIS — I6529 Occlusion and stenosis of unspecified carotid artery: Secondary | ICD-10-CM

## 2020-12-19 DIAGNOSIS — M1120 Other chondrocalcinosis, unspecified site: Secondary | ICD-10-CM

## 2020-12-19 DIAGNOSIS — M1189 Other specified crystal arthropathies, multiple sites: Secondary | ICD-10-CM

## 2020-12-19 LAB — COMPREHENSIVE METABOLIC PANEL
ALT: 131 U/L — ABNORMAL HIGH (ref 0–44)
AST: 100 U/L — ABNORMAL HIGH (ref 15–41)
Albumin: 2.3 g/dL — ABNORMAL LOW (ref 3.5–5.0)
Alkaline Phosphatase: 243 U/L — ABNORMAL HIGH (ref 38–126)
Anion gap: 9 (ref 5–15)
BUN: 42 mg/dL — ABNORMAL HIGH (ref 8–23)
CO2: 26 mmol/L (ref 22–32)
Calcium: 10 mg/dL (ref 8.9–10.3)
Chloride: 99 mmol/L (ref 98–111)
Creatinine, Ser: 1.16 mg/dL — ABNORMAL HIGH (ref 0.44–1.00)
GFR, Estimated: 45 mL/min — ABNORMAL LOW (ref >60)
Glucose, Bld: 221 mg/dL — ABNORMAL HIGH (ref 70–99)
Potassium: 4.7 mmol/L (ref 3.5–5.1)
Sodium: 134 mmol/L — ABNORMAL LOW (ref 135–145)
Total Bilirubin: 0.4 mg/dL (ref 0.3–1.2)
Total Protein: 7 g/dL (ref 6.5–8.1)

## 2020-12-19 LAB — SYNOVIAL CELL COUNT + DIFF, W/ CRYSTALS
Eosinophils-Synovial: 0 %
Lymphocytes-Synovial Fld: 1 %
Monocyte-Macrophage-Synovial Fluid: 8 %
Neutrophil, Synovial: 91 %
WBC, Synovial: 154 /mm3 (ref 0–200)

## 2020-12-19 LAB — GLUCOSE, CAPILLARY
Glucose-Capillary: 178 mg/dL — ABNORMAL HIGH (ref 70–99)
Glucose-Capillary: 180 mg/dL — ABNORMAL HIGH (ref 70–99)
Glucose-Capillary: 223 mg/dL — ABNORMAL HIGH (ref 70–99)
Glucose-Capillary: 215 mg/dL — ABNORMAL HIGH (ref 70–99)

## 2020-12-19 MED ORDER — ENOXAPARIN SODIUM 40 MG/0.4ML IJ SOSY
40.0000 mg | PREFILLED_SYRINGE | INTRAMUSCULAR | Status: DC
  Administered 2020-12-20: 40 mg via SUBCUTANEOUS
  Filled 2020-12-19: qty 0.4

## 2020-12-19 NOTE — Progress Notes (Signed)
Date of Admission:  12/10/2020    ID: [name] is a 87 y.o. female Active Problems:   Carotid stenosis   Essential hypertension   CAD (coronary artery disease)   Type 2 diabetes mellitus (HCC)   Syncope and collapse   Syncope    Subjective: Patient still has pain in her neck. Right wrist pain seems to be a little better Pain in other joints like hips and knees. Medications:   amLODipine  5 mg Oral Daily   Chlorhexidine Gluconate Cloth  6 each Topical Daily   clopidogrel  75 mg Oral Daily   docusate sodium  200 mg Oral BID   enoxaparin (LOVENOX) injection  30 mg Subcutaneous Q24H   feeding supplement (GLUCERNA SHAKE)  237 mL Oral TID   fluticasone  1 spray Each Nare Daily   insulin aspart  0-15 Units Subcutaneous TID WC   insulin aspart  0-5 Units Subcutaneous QHS   isosorbide mononitrate  60 mg Oral Daily   polyethylene glycol  17 g Oral Daily   sodium chloride flush  3 mL Intravenous Q12H   timolol  1 drop Both Eyes QHS   vitamin B-12  100 mcg Oral Daily    Objective: Vital signs in last 24 hours: Temp:  [97.7 F (36.5 C)-98.6 F (37 C)] 98.1 F (36.7 C) (09/01 0801) Pulse Rate:  [77-99] 81 (09/01 0801) Resp:  [16-19] 18 (09/01 0325) BP: (132-180)/(48-127) 152/127 (09/01 0801) SpO2:  [97 %-99 %] 97 % (09/01 0801) Weight:  [58.3 kg] 58.3 kg (09/01 0325)  PHYSICAL EXAM:  General: Somnolent. Responds appropriately on waking up.  Lungs: Clear to auscultation bilaterally. No Wheezing or Rhonchi. No rales. Heart: Regular rate and rhythm, no murmur, rub or gallop. Abdomen: Soft, non-tender,not distended. Bowel sounds normal. No masses Extremities: Right wrist swelling and erythema is much better.  But she has pain on moving the joints. Skin: No rashes or lesions. Or bruising Lymph: Cervical, supraclavicular normal. Neurologic: Grossly non-focal  Lab Results Recent Labs    12/17/20 0522 12/18/20 0501  WBC 13.0* 12.8*  HGB 11.1* 10.4*  HCT 31.8* 30.5*   NA 134* 136  K 4.3 4.8  CL 101 101  CO2 25 26  BUN 43* 46*  CREATININE 1.28* 1.21*   Liver Panel Recent Labs    12/17/20 0522 12/18/20 0501  PROT 6.8 7.1  ALBUMIN 2.6* 2.5*  AST 85* 139*  ALT 77* 136*  ALKPHOS 163* 223*  BILITOT 0.7 0.5   Sedimentation Rate Recent Labs    12/18/20 0501  ESRSEDRATE 127*   C-Reactive Protein Recent Labs    12/18/20 0503  CRP 25.6*    Microbiology:  Studies/Results: DG Wrist Complete Right  Result Date: 12/17/2020 CLINICAL DATA:  RIGHT wrist pain. EXAM: RIGHT WRIST - COMPLETE 3+ VIEW COMPARISON:  None FINDINGS: Degenerative changes throughout the wrist. Most pronounced in the first carpometacarpal joint. No sign of acute fracture. No dislocation. No substantial soft tissue swelling. IMPRESSION: Degenerative changes, no acute findings. Electronically Signed   By: Donzetta Kohut M.D.   On: 12/17/2020 14:02   MR ANGIO HEAD WO CONTRAST  Result Date: 12/19/2020 CLINICAL DATA:  Carotid artery stenosis EXAM: MRA HEAD WITHOUT CONTRAST TECHNIQUE: Angiographic images of the Circle of Willis were acquired using MRA technique without intravenous contrast. COMPARISON:  No pertinent prior exam. FINDINGS: POSTERIOR CIRCULATION: --Vertebral arteries: Normal --Inferior cerebellar arteries: Normal. --Basilar artery: Normal. --Superior cerebellar arteries: Normal. --Posterior cerebral arteries: Moderate stenosis of the left  proximal P2 segment. Otherwise normal. ANTERIOR CIRCULATION: --Intracranial internal carotid arteries: Normal. --Anterior cerebral arteries (ACA): Normal. --Middle cerebral arteries (MCA): Normal. ANATOMIC VARIANTS: None IMPRESSION: 1. No intracranial arterial occlusion or high-grade stenosis. 2. Moderate stenosis of the left proximal P2 segment. Electronically Signed   By: Deatra Robinson M.D.   On: 12/19/2020 00:13   MR ANGIO NECK WO CONTRAST  Result Date: 12/19/2020 CLINICAL DATA:  Carotid artery stenosis EXAM: MRA NECK WITHOUT CONTRAST  TECHNIQUE: Angiographic images of the neck were acquired using MRA technique without intravenous contrast. Carotid stenosis measurements (when applicable) are obtained utilizing NASCET criteria, using the distal internal carotid diameter as the denominator. COMPARISON:  Carotid ultrasound 12/11/2020 FINDINGS: Aortic arch: 4 vessel branch pattern with independent origin of the left vertebral artery. Right carotid system: 60% stenosis due to eccentric atherosclerotic plaque within the proximal ICA. Left carotid system: 30% stenosis the proximal left ICA. Vertebral arteries: Codominant.  Normal. Other: None. IMPRESSION: 1. 60% stenosis of the proximal right ICA due to eccentric atherosclerotic plaque. 2. 30% stenosis of the proximal left ICA. Electronically Signed   By: Deatra Robinson M.D.   On: 12/19/2020 00:31   US Abdomen Limited RUQ (LIVER/GB)  Result Date: 12/18/2020 CLINICAL DATA:  Abnormal LFTs. EXAM: ULTRASOUND ABDOMEN LIMITED RIGHT UPPER QUADRANT COMPARISON:  None. FINDINGS: Gallbladder: No gallstones or wall thickening visualized. No sonographic Murphy sign noted by sonographer. Common bile duct: Diameter: 2 mm Liver: No focal lesion identified. Within normal limits in parenchymal echogenicity. Portal vein is patent on color Doppler imaging with normal direction of blood flow towards the liver. Other: None. IMPRESSION: Unremarkable study. No findings to explain the history of abnormal LFTs. Electronically Signed   By: Kennith Center M.D.   On: 12/18/2020 10:21     Assessment/Plan: Syncope and fall.  Recurrent.  Etiology unclear  Right wrist neck back pain and knee and hip joint pain Low-grade fever and look low-grade leukocytosis. Pseudogout/gout high in the differential Septic joint unlikely Seen by rheumatologist and the knee was aspirated. Appreciate Dr. Guillermina [place] consult  Continue to hold off antibiotics as no evidence of UTI, pneumonia, bacteremia and patient is not septic.  Fluctuating  encephalopathy very likely due to pain meds and muscle relaxants.  Discussed the management with the patient, son and rheumatologist

## 2020-12-19 NOTE — Consult Note (Signed)
[facility] Vascular Consult Note  MRN : [id number]  [name] is a 87 y.o. (Oct 13, 1930) female who presents with chief complaint of  Chief Complaint  Patient presents with   Fall   History of Present Illness:  [name] is a 87 y.o. female with medical history significant for DM, HTN, CAD, CKD 3B with history of stent placement, prior admission in 2021 for syncope related to beta-blocker induced bradycardia who presents to the emergency room following a syncopal episode.    History is limited as patient has no recollection of the event.  States she last remembers sitting at the dining table about to have a meal and she remembers feeling like she was about to pass out and the next thing she remembered was being in the hospital.  Does not remember the ambulance ride clearly.  She did state that the day prior she forgot to take her morning medications and took them at 11:30 that night, and on the day of arrival instead of taking the a.m. medications in the morning a she took them at 5:30 the evening while sitting at the dining room table. She complains of headache to the back of her head but denies any other complaints.  Said she was in her usual state of health prior to the event.  She denies recent illness.  Denies recent nausea, vomiting, diarrhea or abdominal pain.  Denies recent cough, shortness of breath or chest pain and denies recent headaches, visual disturbance, one-sided numbness weakness or tingling.  Denies palpitations.     ED course: On arrival, afebrile, pulse 66, BP 158/50 with O2 sat 97% on room air Blood work with WBC 10,800, normal hemoglobin.  Creatinine 1.36, which appears to be her baseline.  Troponin of 8.  Urinalysis unremarkable.  COVID and flu negative   Vascular surgery was consulted for carotid stenosis in the setting of syncope by Dr. Para March   Current Facility-Administered Medications  Medication Dose Route Frequency Provider Last Rate  Last Admin   acetaminophen (TYLENOL) tablet 650 mg  650 mg Oral Q6H PRN Andris Baumann, MD   650 mg at 12/17/20 1749   Or   acetaminophen (TYLENOL) suppository 650 mg  650 mg Rectal Q6H PRN Andris Baumann, MD       amLODipine (NORVASC) tablet 5 mg  5 mg Oral Daily Charise Killian, MD   5 mg at 12/19/20 1035   Chlorhexidine Gluconate Cloth 2 % PADS 6 each  6 each Topical Daily Charise Killian, MD   6 each at 12/19/20 1036   clopidogrel (PLAVIX) tablet 75 mg  75 mg Oral Daily Manuela Schwartz, NP   75 mg at 12/19/20 1035   docusate sodium (COLACE) capsule 200 mg  200 mg Oral BID Charise Killian, MD   200 mg at 12/19/20 1035   enoxaparin (LOVENOX) injection 30 mg  30 mg Subcutaneous Q24H Albina Billet, RPH   30 mg at 12/19/20 1032   feeding supplement (GLUCERNA SHAKE) (GLUCERNA SHAKE) liquid 237 mL  237 mL Oral TID Charise Killian, MD   237 mL at 12/19/20 1035   fluticasone (FLONASE) 50 MCG/ACT nasal spray 1 spray  1 spray Each Nare Daily Manuela Schwartz, NP   1 spray at 12/18/20 0852   hydrALAZINE (APRESOLINE) injection 10 mg  10 mg Intravenous Q6H PRN Manuela Schwartz, NP   10 mg at 12/12/20 0458   insulin aspart (novoLOG) injection 0-15 Units  0-15  Units Subcutaneous TID WC Andris Baumann, MD   3 Units at 12/19/20 1028   insulin aspart (novoLOG) injection 0-5 Units  0-5 Units Subcutaneous QHS Andris Baumann, MD   2 Units at 12/18/20 2018   isosorbide mononitrate (IMDUR) 24 hr tablet 60 mg  60 mg Oral Daily Charise Killian, MD   60 mg at 12/19/20 1035   lidocaine (LIDODERM) 5 % 1 patch  1 patch Transdermal Daily PRN Charise Killian, MD       nitroGLYCERIN (NITROSTAT) SL tablet 0.4 mg  0.4 mg Sublingual Q5 min PRN Manuela Schwartz, NP       ondansetron [facility]) tablet 4 mg  4 mg Oral Q6H PRN Andris Baumann, MD   4 mg at 12/12/20 1478   Or   ondansetron (ZOFRAN) injection 4 mg  4 mg Intravenous Q6H PRN Andris Baumann, MD       polyethylene glycol (MIRALAX /  GLYCOLAX) packet 17 g  17 g Oral Daily Charise Killian, MD   17 g at 12/19/20 1036   sodium chloride flush (NS) 0.9 % injection 3 mL  3 mL Intravenous Q12H Lindajo Royal V, MD   3 mL at 12/19/20 1036   timolol (TIMOPTIC) 0.5 % ophthalmic solution 1 drop  1 drop Both Eyes QHS Manuela Schwartz, NP   1 drop at 12/18/20 2019   traMADol (ULTRAM) tablet 50 mg  50 mg Oral Q6H PRN Charise Killian, MD   50 mg at 12/18/20 1835   vitamin B-12 (CYANOCOBALAMIN) tablet 100 mcg  100 mcg Oral Daily Manuela Schwartz, NP   100 mcg at 12/19/20 1037   Past Medical History:  Diagnosis Date   Hypercholesteremia    Hypertension    Myocardial infarction (HCC) 2008   S/P angioplasty with stent    Past Surgical History:  Procedure Laterality Date   ABDOMINAL HYSTERECTOMY     APPENDECTOMY     CORONARY ANGIOPLASTY WITH STENT PLACEMENT     CORONARY STENT INTERVENTION N/A 12/12/2019   Procedure: CORONARY STENT INTERVENTION;  Surgeon: Marcina Millard, MD;  Location: [facility] INVASIVE CV LAB;  Service: Cardiovascular;  Laterality: N/A;   LEFT HEART CATH AND CORONARY ANGIOGRAPHY N/A 12/12/2019   Procedure: LEFT HEART CATH AND CORONARY ANGIOGRAPHY with possible PCI;  Surgeon: Marcina Millard, MD;  Location: [facility] INVASIVE CV LAB;  Service: Cardiovascular;  Laterality: N/A;   Social History Social History   Tobacco Use   Smoking status: Never   Smokeless tobacco: Never  Vaping Use   Vaping Use: Never used  Substance Use Topics   Alcohol use: No   Drug use: No   Family History Family History  Problem Relation Age of Onset   Breast cancer Maternal Aunt   Denies family history of peripheral artery disease, venous disease or renal disease.  Allergies  Allergen Reactions   Butenafine Swelling    Tongue swelled and turned red   Levofloxacin Swelling and Other (See Comments)    Other reaction(s): Other (See Comments) Tongue swelled and turned red   Limonene Other (See Comments)    Other  reaction(s): Other (See Comments) Tongue swelled and turned red Tongue swelled and turned red    Penicillins     Other reaction(s): Unknown   Terbinafine And Related Other (See Comments)    Tongue swelled and turned red   REVIEW OF SYSTEMS (Negative unless checked)  Constitutional: [] Weight loss  [] Fever  [] Chills Cardiac: [] Chest pain   [] Chest pressure   []   Palpitations   [] Shortness of breath when laying flat   [] Shortness of breath at rest   [x] Shortness of breath with exertion. Vascular:  [] Pain in legs with walking   [] Pain in legs at rest   [] Pain in legs when laying flat   [] Claudication   [] Pain in feet when walking  [] Pain in feet at rest  [] Pain in feet when laying flat   [] History of DVT   [] Phlebitis   [] Swelling in legs   [] Varicose veins   [] Non-healing ulcers Pulmonary:   [] Uses home oxygen   [] Productive cough   [] Hemoptysis   [] Wheeze  [] COPD   [] Asthma Neurologic:  [x] Dizziness  [x] Blackouts   [] Seizures   [] History of stroke   [] History of TIA  [] Aphasia   [] Temporary blindness   [] Dysphagia   [] Weakness or numbness in arms   [] Weakness or numbness in legs Musculoskeletal:  [] Arthritis   [] Joint swelling   [] Joint pain   [] Low back pain Hematologic:  [] Easy bruising  [] Easy bleeding   [] Hypercoagulable state   [] Anemic  [] Hepatitis Gastrointestinal:  [] Blood in stool   [] Vomiting blood  [] Gastroesophageal reflux/heartburn   [] Difficulty swallowing. Genitourinary:  [] Chronic kidney disease   [] Difficult urination  [] Frequent urination  [] Burning with urination   [] Blood in urine Skin:  [] Rashes   [] Ulcers   [] Wounds Psychological:  [] History of anxiety   []  History of major depression.  Physical Examination  Vitals:   12/18/20 2355 12/19/20 0325 12/19/20 0801 12/19/20 1108  BP: (!) 161/55 (!) 168/55 (!) 152/127 (!) 149/60  Pulse: 84 87 81 83  Resp: 19 18    Temp: 98.6 F (37 C) 97.9 F (36.6 C) 98.1 F (36.7 C) 98.6 F (37 C)  TempSrc:    Oral  SpO2: 97% 97% 97%  95%  Weight:  58.3 kg    Height:       Body mass index is 20.76 kg/m. Gen:  WD/WN, NAD Head: Bernice/AT, No temporalis wasting. Prominent temp pulse not noted. Ear/Nose/Throat: Hearing grossly intact, nares w/o erythema or drainage, oropharynx w/o Erythema/Exudate Eyes: Sclera non-icteric, conjunctiva clear Neck: Trachea midline.  No JVD.  Pulmonary:  Good air movement, respirations not labored, equal bilaterally.  Cardiac: RRR, normal S1, S2. Vascular:  Vessel Right Left  Radial Palpable Palpable  Ulnar Palpable Palpable                               Gastrointestinal: soft, non-tender/non-distended. No guarding/reflex.  Musculoskeletal: M/S 5/5 throughout.  Extremities without ischemic changes.  No deformity or atrophy. No edema. Neurologic: Sensation grossly intact in extremities.  Symmetrical.  Speech is fluent. Motor exam as listed above. Psychiatric: Judgment intact, Mood & affect appropriate for pt's clinical situation. Dermatologic: No rashes or ulcers noted.  No cellulitis or open wounds. Lymph : No Cervical, Axillary, or Inguinal lymphadenopathy.  CBC Lab Results  Component Value Date   WBC 12.8 (H) 12/18/2020   HGB 10.4 (L) 12/18/2020   HCT 30.5 (L) 12/18/2020   MCV 93.0 12/18/2020   PLT 410 (H) 12/18/2020   BMET    Component Value Date/Time   NA 136 12/18/2020 0501   NA 139 08/16/2012 0255   K 4.8 12/18/2020 0501   K 3.9 08/16/2012 0255   CL 101 12/18/2020 0501   CL 108 (H) 08/16/2012 0255   CO2 26 12/18/2020 0501   CO2 25 08/16/2012 0255   GLUCOSE 155 (H) 12/18/2020 0501  GLUCOSE 129 (H) 08/16/2012 0255   BUN 46 (H) 12/18/2020 0501   BUN 21 (H) 08/16/2012 0255   CREATININE 1.21 (H) 12/18/2020 0501   CREATININE 1.43 (H) 08/16/2012 0255   CALCIUM 10.0 12/18/2020 0501   CALCIUM 8.5 08/16/2012 0255   GFRNONAA 43 (L) 12/18/2020 0501   GFRNONAA 35 (L) 08/16/2012 0255   GFRAA 46 (L) 12/13/2019 0519   GFRAA 40 (L) 08/16/2012 0255   Estimated Creatinine  Clearance: 29.6 mL/min (A) (by C-G formula based on SCr of 1.21 mg/dL (H)).  COAG Lab Results  Component Value Date   INR 0.9 08/26/2020   INR 0.8 12/11/2019   INR 1.0 11/17/2019   Radiology DG Wrist Complete Right  Result Date: 12/17/2020 CLINICAL DATA:  RIGHT wrist pain. EXAM: RIGHT WRIST - COMPLETE 3+ VIEW COMPARISON:  None FINDINGS: Degenerative changes throughout the wrist. Most pronounced in the first carpometacarpal joint. No sign of acute fracture. No dislocation. No substantial soft tissue swelling. IMPRESSION: Degenerative changes, no acute findings. Electronically Signed   By: Donzetta KohutGeoffrey  Wile M.D.   On: 12/17/2020 14:02   CT HEAD WO CONTRAST (5MM)  Result Date: 12/10/2020 CLINICAL DATA:  Syncope, fall EXAM: CT HEAD WITHOUT CONTRAST TECHNIQUE: Contiguous axial images were obtained from the base of the skull through the vertex without intravenous contrast. COMPARISON:  08/26/2020 FINDINGS: Brain: There is atrophy and chronic small vessel disease changes. No acute intracranial abnormality. Specifically, no hemorrhage, hydrocephalus, mass lesion, acute infarction, or significant intracranial injury. Vascular: No hyperdense vessel or unexpected calcification. Skull: No acute calvarial abnormality. Sinuses/Orbits: Visualized paranasal sinuses and mastoids clear. Orbital soft tissues unremarkable. Other: None IMPRESSION: Atrophy, chronic microvascular disease. No acute intracranial abnormality. Electronically Signed   By: Charlett NoseKevin  Dover M.D.   On: 12/10/2020 21:39   CT Cervical Spine Wo Contrast  Result Date: 12/10/2020 CLINICAL DATA:  Neck trauma (Age >= 65y) Syncope, fall from standing position. EXAM: CT CERVICAL SPINE WITHOUT CONTRAST TECHNIQUE: Multidetector CT imaging of the cervical spine was performed without intravenous contrast. Multiplanar CT image reconstructions were also generated. COMPARISON:  CT cervical spine 08/26/2020 FINDINGS: Alignment: Stable from prior exam. No traumatic  subluxation. Similar grade 1 anterolisthesis of C2 on C3 and C4 on C5. Skull base and vertebrae: No acute fracture. The dens and skull base are intact. Soft tissues and spinal canal: No prevertebral fluid or swelling. No visible canal hematoma. Disc levels: Diffuse degenerative disc disease and facet hypertrophy. Stable degenerative change from prior. C1-C2 degenerative change with mild pannus. Upper chest: No acute findings. Other: Carotid calcifications. IMPRESSION: 1. No acute fracture or subluxation of the cervical spine. 2. Stable degenerative disc disease and facet hypertrophy. Electronically Signed   By: Narda RutherfordMelanie  Sanford M.D.   On: 12/10/2020 21:53   MR ANGIO HEAD WO CONTRAST  Result Date: 12/19/2020 CLINICAL DATA:  Carotid artery stenosis EXAM: MRA HEAD WITHOUT CONTRAST TECHNIQUE: Angiographic images of the Circle of Willis were acquired using MRA technique without intravenous contrast. COMPARISON:  No pertinent prior exam. FINDINGS: POSTERIOR CIRCULATION: --Vertebral arteries: Normal --Inferior cerebellar arteries: Normal. --Basilar artery: Normal. --Superior cerebellar arteries: Normal. --Posterior cerebral arteries: Moderate stenosis of the left proximal P2 segment. Otherwise normal. ANTERIOR CIRCULATION: --Intracranial internal carotid arteries: Normal. --Anterior cerebral arteries (ACA): Normal. --Middle cerebral arteries (MCA): Normal. ANATOMIC VARIANTS: None IMPRESSION: 1. No intracranial arterial occlusion or high-grade stenosis. 2. Moderate stenosis of the left proximal P2 segment. Electronically Signed   By: Deatra RobinsonKevin  Herman M.D.   On: 12/19/2020 00:13   MR  ANGIO NECK WO CONTRAST  Result Date: 12/19/2020 CLINICAL DATA:  Carotid artery stenosis EXAM: MRA NECK WITHOUT CONTRAST TECHNIQUE: Angiographic images of the neck were acquired using MRA technique without intravenous contrast. Carotid stenosis measurements (when applicable) are obtained utilizing NASCET criteria, using the distal internal  carotid diameter as the denominator. COMPARISON:  Carotid ultrasound 12/11/2020 FINDINGS: Aortic arch: 4 vessel branch pattern with independent origin of the left vertebral artery. Right carotid system: 60% stenosis due to eccentric atherosclerotic plaque within the proximal ICA. Left carotid system: 30% stenosis the proximal left ICA. Vertebral arteries: Codominant.  Normal. Other: None. IMPRESSION: 1. 60% stenosis of the proximal right ICA due to eccentric atherosclerotic plaque. 2. 30% stenosis of the proximal left ICA. Electronically Signed   By: Deatra Robinson M.D.   On: 12/19/2020 00:31   DG Hand 2 View Left  Result Date: 12/15/2020 CLINICAL DATA:  Pain after fall. EXAM: LEFT HAND - 2 VIEW COMPARISON:  March 15, 2019 FINDINGS: There is no evidence of fracture or dislocation. There is no evidence of arthropathy or other focal bone abnormality. Soft tissues are unremarkable. IMPRESSION: Negative. Electronically Signed   By: Gerome Sam III M.D.   On: 12/15/2020 20:26   US Carotid Bilateral  Result Date: 12/11/2020 CLINICAL DATA:  Initial evaluation for syncope. EXAM: BILATERAL CAROTID DUPLEX ULTRASOUND TECHNIQUE: Wallace Cullens scale imaging, color Doppler and duplex ultrasound were performed of bilateral carotid and vertebral arteries in the neck. COMPARISON:  Prior CTA from 09/06/2012. FINDINGS: Criteria: Quantification of carotid stenosis is based on velocity parameters that correlate the residual internal carotid diameter with NASCET-based stenosis levels, using the diameter of the distal internal carotid lumen as the denominator for stenosis measurement. The following velocity measurements were obtained: RIGHT ICA: 200/26 cm/sec CCA: 118/15 cm/sec SYSTOLIC ICA/CCA RATIO:  2.1 ECA: 182 cm/sec LEFT ICA: 131/24 cm/sec CCA: 139/13 cm/sec SYSTOLIC ICA/CCA RATIO:  1.4 ECA: 282 cm/sec RIGHT CAROTID ARTERY: Scattered intimal thickening seen within the visualized right CCA without significant stenosis. Moderate  heterogeneous echogenic plaque seen about the right carotid bulb, extending into the proximal right ICA. Elevation in peak systolic velocity up to 200 cm/sec, with systolic ICA/CCA ratio of 2.1, consistent with a 50-69% stenosis. Visualized right ICA irregular but patent distally. Severe stenosis at the origin of the right external carotid artery noted as well. RIGHT VERTEBRAL ARTERY:  Antegrade. LEFT CAROTID ARTERY: Smooth intimal thickening seen within the visualized left CCA without significant stenosis. Heterogeneous echogenic calcified plaque about the left carotid bulb extending into the proximal left ICA. Elevation in peak systolic velocity up to 131 cm/sec, consistent with a 50-69% stenosis. Partially visualized left ICA patent distally. Severe stenosis at the origin of the left external carotid artery noted as well. LEFT VERTEBRAL ARTERY:  Antegrade. IMPRESSION: 1. Heavy calcified plaque about the carotid bulbs/proximal ICAs, with estimated stenoses of up to 50-69% bilaterally, right worse than left. Correlation with dedicated CTA could be performed for further evaluation as warranted. 2. Severe stenoses at the origins of both external carotid arteries. 3. Patent antegrade flow within the vertebral arteries bilaterally. Electronically Signed   By: Rise Mu M.D.   On: 12/11/2020 04:31   DG Chest Port 1 View  Result Date: 12/14/2020 CLINICAL DATA:  Syncope and fall, concern for pneumonia. EXAM: PORTABLE CHEST 1 VIEW COMPARISON:  Chest radiograph dated 08/26/2020. FINDINGS: The heart size is normal. Vascular calcifications are seen in the aortic arch. The left costophrenic angle is obscured which may reflect a trace pleural  effusion and or atelectasis/airspace disease. The right lung is clear with no pleural effusion. There is no pneumothorax. Degenerative changes are seen in the spine. IMPRESSION: Obscured left costophrenic angle may reflect trace pleural effusion or atelectasis/airspace  disease. Electronically Signed   By: Romona Curls M.D.   On: 12/14/2020 10:09   ECHOCARDIOGRAM COMPLETE  Result Date: 12/11/2020    ECHOCARDIOGRAM REPORT   Patient Name:   PATTIJO JUSTE Mikus Date of Exam: 12/11/2020 Medical Rec #:  [id number]     Height:       66.0 in Accession #:    [id number]    Weight:       134.9 lb Date of Birth:  11-Apr-1931     BSA:          1.692 m Patient Age:    89 years      BP:           157/62 mmHg Patient Gender: F             HR:           79 bpm. Exam Location:  [facility] Procedure: 2D Echo, Color Doppler and Cardiac Doppler Indications:     R55 Syncope  History:         Patient has prior history of Echocardiogram examinations, most                  recent 11/20/2019. CAD, CKD; Risk Factors:Hypertension, HCL and                  Diabetes.  Sonographer:     Humphrey Rolls Referring Phys:  [id number] Andris Baumann Diagnosing Phys: Alwyn Pea MD IMPRESSIONS  1. Normal LVF.  2. Normal Wall Motion.  3. Left ventricular ejection fraction, by estimation, is 55 to 60%. The left ventricle has normal function. The left ventricle has no regional wall motion abnormalities. Left ventricular diastolic parameters were normal.  4. Right ventricular systolic function is normal. The right ventricular size is normal.  5. The mitral valve is grossly normal. No evidence of mitral valve regurgitation.  6. The aortic valve is normal in structure. Aortic valve regurgitation is not visualized. FINDINGS  Left Ventricle: Left ventricular ejection fraction, by estimation, is 55 to 60%. The left ventricle has normal function. The left ventricle has no regional wall motion abnormalities. The left ventricular internal cavity size was normal in size. There is  no left ventricular hypertrophy. Left ventricular diastolic parameters were normal. Right Ventricle: The right ventricular size is normal. No increase in right ventricular wall thickness. Right ventricular systolic function is normal. Left Atrium: Left atrial size was  normal in size. Right Atrium: Right atrial size was normal in size. Pericardium: There is no evidence of pericardial effusion. Mitral Valve: The mitral valve is grossly normal. No evidence of mitral valve regurgitation. MV peak gradient, 10.1 mmHg. The mean mitral valve gradient is 4.0 mmHg. Tricuspid Valve: The tricuspid valve is normal in structure. Tricuspid valve regurgitation is not demonstrated. Aortic Valve: The aortic valve is normal in structure. Aortic valve regurgitation is not visualized. Aortic valve mean gradient measures 7.0 mmHg. Aortic valve peak gradient measures 13.5 mmHg. Aortic valve area, by VTI measures 2.21 cm. Pulmonic Valve: The pulmonic valve was normal in structure. Pulmonic valve regurgitation is not visualized. Aorta: The ascending aorta was not well visualized. IAS/Shunts: No atrial level shunt detected by color flow Doppler. Additional Comments: Normal LVF. Normal Wall Motion.  LEFT VENTRICLE PLAX 2D  LVIDd:         3.68 cm  Diastology LVIDs:         2.53 cm  LV e' medial:    5.55 cm/s LV PW:         0.90 cm  LV E/e' medial:  16.5 LV IVS:        0.93 cm  LV e' lateral:   5.55 cm/s LVOT diam:     1.80 cm  LV E/e' lateral: 16.5 LV SV:         79 LV SV Index:   47 LVOT Area:     2.54 cm  RIGHT VENTRICLE RV Basal diam:  2.45 cm LEFT ATRIUM           Index       RIGHT ATRIUM          Index LA diam:      3.20 cm 1.89 cm/m  RA Area:     9.76 cm LA Vol (A4C): 55.8 ml 32.98 ml/m RA Volume:   18.40 ml 10.88 ml/m  AORTIC VALVE                    PULMONIC VALVE AV Area (Vmax):    1.99 cm     PV Vmax:          1.05 m/s AV Area (Vmean):   2.27 cm     PV Vmean:         69.400 cm/s AV Area (VTI):     2.21 cm     PV VTI:           0.172 m AV Vmax:           184.00 cm/s  PV Peak grad:     4.4 mmHg AV Vmean:          120.000 cm/s PV Mean grad:     2.0 mmHg AV VTI:            0.360 m      PR End Diast Vel: 4.49 msec AV Peak Grad:      13.5 mmHg AV Mean Grad:      7.0 mmHg LVOT Vmax:          144.00 cm/s LVOT Vmean:        107.000 cm/s LVOT VTI:          0.312 m LVOT/AV VTI ratio: 0.87  AORTA Ao Root diam: 2.80 cm MITRAL VALVE MV Area (PHT): 3.11 cm     SHUNTS MV Area VTI:   2.30 cm     Systemic VTI:  0.31 m MV Peak grad:  10.1 mmHg    Systemic Diam: 1.80 cm MV Mean grad:  4.0 mmHg MV Vmax:       1.59 m/s MV Vmean:      90.1 cm/s MV Decel Time: 244 msec MV E velocity: 91.30 cm/s MV A velocity: 145.00 cm/s MV E/A ratio:  0.63 Dwayne D Callwood MD Electronically signed by Alwyn Pea MD Signature Date/Time: 12/11/2020/2:20:11 PM    Final    DG HIP UNILAT WITH PELVIS 1V LEFT  Result Date: 12/15/2020 CLINICAL DATA:  Pain after fall EXAM: DG HIP (WITH OR WITHOUT PELVIS) 1V*L* COMPARISON:  None. FINDINGS: Vascular calcifications.  No fracture or dislocation. IMPRESSION: Negative. Electronically Signed   By: Gerome Sam III M.D.   On: 12/15/2020 20:27   US Abdomen Limited RUQ (LIVER/GB)  Result Date: 12/18/2020 CLINICAL DATA:  Abnormal LFTs. EXAM:  ULTRASOUND ABDOMEN LIMITED RIGHT UPPER QUADRANT COMPARISON:  None. FINDINGS: Gallbladder: No gallstones or wall thickening visualized. No sonographic Murphy sign noted by sonographer. Common bile duct: Diameter: 2 mm Liver: No focal lesion identified. Within normal limits in parenchymal echogenicity. Portal vein is patent on color Doppler imaging with normal direction of blood flow towards the liver. Other: None. IMPRESSION: Unremarkable study. No findings to explain the history of abnormal LFTs. Electronically Signed   By: Kennith Center M.D.   On: 12/18/2020 10:21    Assessment/Plan The patient is an 87 year old female with multiple medical issues who presented to the [facility] emergency department after a syncopal episode  1.  Carotid stenosis: Originally found on carotid duplex.  Follow-up MRA of the head and neck notable for no acute abnormalities in the head.  MRI neck notable for: 1. 60% stenosis of the proximal  right ICA due to eccentric atherosclerotic plaque. 2. 30% stenosis of the proximal left ICA.  Reviewed imaging with Dr. Gilda Crease.  Patient with moderate stenosis to the right, minimal to the left.  Do not feel that her syncopal episode was due to carotid stenosis as her disease is not significant.  We will continue to follow and monitor her in the outpatient setting.  On Plavix for medical management.  Would consider the addition of aspirin and statin otherwise no further recommendations from vascular surgery at this time.  2.  Hyperlipidemia: Patient is currently on Plavix.  We consider the addition of aspirin and statin for medical management.  Looks like the patient is taking aspirin / statin at home. Encouraged good control as its slows the progression of atherosclerotic disease   3.  Diabetes: Diet controlled Encouraged good control as its slows the progression of atherosclerotic disease   Discussed with Dr. Romie Jumper, PA-C  12/19/2020 12:14 PM  This note was created with Dragon medical transcription system.  Any error is purely unintentional.

## 2020-12-19 NOTE — TOC Progression Note (Addendum)
Transition of Care [facility]) - Progression Note    Patient Details  Name: PALLAVI CLIFTON MRN: [id number] Date of Birth: [birth year]  Transition of Care [facility]) CM/SW Contact  Gildardo Griffes, [place] Phone Number: 12/19/2020, 11:36 AM  Clinical Narrative:     Patient has bed at [facility].   Per MD patient is pending SNF for discharge, MD updated that patient has had a bed at [facility].   Pending dc summary and orders for patient to discharge to [facility] at this time.   CSW has requested updated covid test e ordered by MD.   Expected Discharge Plan: Skilled Nursing Facility Barriers to Discharge: Continued Medical Work up  Expected Discharge Plan and Services Expected Discharge Plan: Skilled Nursing Facility     Post Acute Care Choice: Skilled Nursing Facility Living arrangements for the past 2 months: Single Family Home                                       Social Determinants of Health (SDOH) Interventions    Readmission Risk Interventions No flowsheet data found.

## 2020-12-19 NOTE — Care Management Important Message (Signed)
Important Message  Sloan Details  Name: [name] MRN: [id number] Date of Birth: [birth year]   Medicare Important Message Given:  Yes     Johnell Comings 12/19/2020, 1:48 PM

## 2020-12-19 NOTE — Progress Notes (Signed)
PROGRESS NOTE    HPI was taken from Dr. Damita Dunnings: [name] is a 87 y.o. female with medical history significant for DM, HTN, CAD, CKD 3B with history of stent placement, prior admission in 2021 for syncope related to beta-blocker induced bradycardia who presents to the emergency room following a syncopal episode.  History is limited as patient has no recollection of the event.  States she last remembers sitting at the dining table about to have a meal and she remembers feeling like she was about to pass out and the next thing she remembered was being in the hospital.  Does not remember the ambulance ride clearly.  She did state that the day prior she forgot to take her morning medications and took them at 11:30 that night, and on the day of arrival instead of taking the a.m. medications in the morning a she took them at 5:30 the evening while sitting at the dining room table. She complains of headache to the back of her head but denies any other complaints.  Said she was in her usual state of health prior to the event.  She denies recent illness.  Denies recent nausea, vomiting, diarrhea or abdominal pain.  Denies recent cough, shortness of breath or chest pain and denies recent headaches, visual disturbance, one-sided numbness weakness or tingling.  Denies palpitations.    Hospital course from Dr. Jimmye Norman 8/24-8/30/22: Pt presented after syncopal episode of unknown etiology. W/ hx of prior syncopal event related to medication & has hx of carotid stenosis. Takes multiple anti-HTN meds including amlodipine, clonidine, imdur, aldactone, torsemide. Echo shows EF 02-54%, normal diastolic function & no regional wall motion abnormalities. Furthermore, pt has been c/o pain over entire body and infectious work-up so far has been neg. WBC is elevated but trending down but pt is spiking fevers. CXR was neg, urine cx neg, blood cxs neg and repeat COVID19 & influenza tests were neg. ID consulted today.  8/31  feels rt hand is painful. No other complaints. Not in the mood to eat this am. 9/1 vascular consulted, pts carotid stenosis was not culprit for syncopal episode. F/u as outpt setting  Ruth Kovich Coates  YHC:[id number] DOB: 15-Dec-1930 DOA: 12/10/2020 PCP: Rusty Aus, MD   Assessment & Plan:   Active Problems:   Carotid stenosis   Essential hypertension   CAD (coronary artery disease)   Type 2 diabetes mellitus (HCC)   Syncope and collapse   Syncope   Syncope: etiology unclear. W/ hx of prior syncopal event related to medication & has hx of carotid stenosis. Takes multiple anti-HTN meds including amlodipine, clonidine, imdur, aldactone, torsemide. Echo shows EF 51-76%, normal diastolic function & no regional wall motion abnormalities.  8/31 her K is trending upward.  Off torsemide.  She is euvolemic on exam.  Will DC Aldactone  9/1 continue amlodipine and imdur Found with carotid stenosis, but not cause of syncope. Vascular input was appreciated, may consider addition of asa. Will need statin once LFT trends down.continue plavix    Carotid stenosis-  Vascular surgery was consulted input was appreciated. Continue Plavix Recommend monitoring as outpatient setting Consider adding aspirin and statin.  Her Crestor was held due to elevated LFTs  Elevated LFT- Continues to trend up.  Abd [place] neg.  9/1- crestor was d/c'd. If starts to trend down , will resume as she has carotid stenosis If LFT trending up will need to consult GI Labs pending    Lt hand pain Fevers Leukocytosis: Blood  cxs NGTD, urine cx no growth, CXR neg for pneumonia, & repeat COVID19 & influenza are both neg. ID consulted Wbc trending down. ID following hand  mildly painful , swollen.  Xr no fx. 9/1-CRP /ESR up Consulted rheumatology this am.    Neck pain: acute on chronic. No mention of neck pain today. CT neck on admission showed No acute fracture or subluxation of the cervical spine. Stable degenerative  disc disease and facet hypertrophy.    Encephalopathy: etiology unclear, metabolic vs toxic. Infectious work-up so far has been neg. CT head on admission was neg for acute intracranial findings. Narcotics/muscle relaxants were d/c 8/28 likely contributing encephalopathy. Started tramadol today as tylenol, lidocaine was not controlling pt's pain.  9/1 MS better today.     HTN: continue on imdur, aldactone, amlodipine & restart torsemide if Cr is trending down    Hx of CAD: no chest pain. Continue on imdur, aldactone   DM2: HbA1c 7.6, poorly controlled. Continue on SSI w/ accuchecks  poorly controlled, HbA1c 7.6. Continue on SSI w/ accuchecks   Likely CKD: baseline Cr/GFR is unknown, currently IIIb. Cr is trending up from day prior    DVT prophylaxis: lovenox  Code Status: full  Family Communication: discussed pt's care w/ pt's son, Chrissie Noa and answered his questions Disposition Plan: possibly to SNF   Level of care: Progressive Cardiac  Status is: Inpatient  Remains inpatient appropriate because:IV treatments appropriate due to intensity of illness or inability to take PO and Inpatient level of care appropriate due to severity of illness. Still spiking fevers  Dispo: The patient is from: Home              Anticipated d/c is to: SNF              Patient currently is not medically stable to d/c.   Difficult to place patient: unclear     Consultants:  ID  Procedures:   Antimicrobials:  Subjective: No chest pain, shortness of breath, or dizziness.  Objective: Vitals:   12/18/20 2355 12/19/20 0325 12/19/20 0801 12/19/20 1108  BP: (!) 161/55 (!) 168/55 (!) 152/127 (!) 149/60  Pulse: 84 87 81 83  Resp: 19 18    Temp: 98.6 F (37 C) 97.9 F (36.6 C) 98.1 F (36.7 C) 98.6 F (37 C)  TempSrc:    Oral  SpO2: 97% 97% 97% 95%  Weight:  58.3 kg    Height:        Intake/Output Summary (Last 24 hours) at 12/19/2020 1459 Last data filed at 12/19/2020 1354 Gross per 24  hour  Intake 240 ml  Output 1400 ml  Net -1160 ml    Filed Weights   12/16/20 0509 12/17/20 0103 12/19/20 0325  Weight: 58.3 kg 58.9 kg 58.3 kg    Examination: Nad, calm Cta no w/r/r Rrr, s1/s2 no gallop Soft benign +bs No edema. LUE warm to touch, less red, mild edema   Data Reviewed: I have personally reviewed following labs and imaging studies  CBC: Recent Labs  Lab 12/14/20 0547 12/15/20 0449 12/16/20 0551 12/17/20 0522 12/18/20 0501  WBC 18.2* 15.8* 14.4* 13.0* 12.8*  HGB 12.8 11.6* 11.7* 11.1* 10.4*  HCT 36.9 32.5* 33.5* 31.8* 30.5*  MCV 96.1 94.8 92.3 93.8 93.0  PLT 324 332 370 362 235*   Basic Metabolic Panel: Recent Labs  Lab 12/14/20 0547 12/15/20 0449 12/16/20 0551 12/17/20 0522 12/18/20 0501  NA 135 132* 132* 134* 136  K 4.2 4.4 4.1 4.3 4.8  CL 100 97* 98 101 101  CO2 _0 GLUCOSE 167* 165* 156* 177* 155*  BUN 27* 35* 31* 43* 46*  CREATININE 1.35* 1.30* 1.12* 1.28* 1.21*  CALCIUM 9.7 9.6 10.0 9.9 10.0   GFR: Estimated Creatinine Clearance: 29.6 mL/min (A) (by C-G formula based on SCr of 1.21 mg/dL (H)). Liver Function Tests: Recent Labs  Lab 12/17/20 0522 12/18/20 0501  AST 85* 139*  ALT 77* 136*  ALKPHOS 163* 223*  BILITOT 0.7 0.5  PROT 6.8 7.1  ALBUMIN 2.6* 2.5*   No results for input(s): LIPASE, AMYLASE in the last 168 hours. Recent Labs  Lab 12/17/20 0522  AMMONIA <9*   Coagulation Profile: No results for input(s): INR, PROTIME in the last 168 hours. Cardiac Enzymes: No results for input(s): CKTOTAL, CKMB, CKMBINDEX, TROPONINI in the last 168 hours. BNP (last 3 results) No results for input(s): PROBNP in the last 8760 hours. HbA1C: No results for input(s): HGBA1C in the last 72 hours.  CBG: Recent Labs  Lab 12/18/20 1109 12/18/20 1612 12/18/20 2009 12/19/20 0802 12/19/20 1112  GLUCAP 124* 238* 206* 178* 180*   Lipid Profile: No results for input(s): CHOL, HDL, LDLCALC, TRIG, CHOLHDL, LDLDIRECT in  the last 72 hours. Thyroid Function Tests: No results for input(s): TSH, T4TOTAL, FREET4, T3FREE, THYROIDAB in the last 72 hours. Anemia Panel: No results for input(s): VITAMINB12, FOLATE, FERRITIN, TIBC, IRON, RETICCTPCT in the last 72 hours. Sepsis Labs: Recent Labs  Lab 12/18/20 0501  PROCALCITON 1.08    Recent Results (from the past 240 hour(s))  Resp Panel by RT-PCR (Flu A&B, Covid) Nasopharyngeal Swab     Status: None   Collection Time: 12/10/20  9:46 PM   Specimen: Nasopharyngeal Swab; Nasopharyngeal(NP) swabs in vial transport medium  Result Value Ref Range Status   SARS Coronavirus 2 by RT PCR NEGATIVE NEGATIVE Final    Comment: (NOTE) SARS-CoV-2 target nucleic acids are NOT DETECTED.  The SARS-CoV-2 RNA is generally detectable in upper respiratory specimens during the acute phase of infection. The lowest concentration of SARS-CoV-2 viral copies this assay can detect is 138 copies/mL. A negative result does not preclude SARS-Cov-2 infection and should not be used as the sole basis for treatment or other patient management decisions. A negative result may occur with  improper specimen collection/handling, submission of specimen other than nasopharyngeal swab, presence of viral mutation(s) within the areas targeted by this assay, and inadequate number of viral copies(<138 copies/mL). A negative result must be combined with clinical observations, patient history, and epidemiological information. The expected result is Negative.  Fact Sheet for Patients:  EntrepreneurPulse.com.au  Fact Sheet for Healthcare Providers:  IncredibleEmployment.be  This test is no t yet approved or cleared by the [place] FDA and  has been authorized for detection and/or diagnosis of SARS-CoV-2 by FDA under an Emergency Use Authorization (EUA). This EUA will remain  in effect (meaning this test can be used) for the duration of the COVID-19 declaration  under Section 564(b)(1) of the Act, 21 U.S.C.section 360bbb-3(b)(1), unless the authorization is terminated  or revoked sooner.       Influenza A by PCR NEGATIVE NEGATIVE Final   Influenza B by PCR NEGATIVE NEGATIVE Final    Comment: (NOTE) The Xpert Xpress SARS-CoV-2/FLU/RSV plus assay is intended as an aid in the diagnosis of influenza from Nasopharyngeal swab specimens and should not be used as a sole basis for treatment. Nasal washings and aspirates are unacceptable for Xpert Xpress SARS-CoV-2/FLU/RSV testing.  Fact Sheet for Patients: EntrepreneurPulse.com.au  Fact Sheet for Healthcare Providers: IncredibleEmployment.be  This test is not yet approved or cleared by the [place] FDA and has been authorized for detection and/or diagnosis of SARS-CoV-2 by FDA under an Emergency Use Authorization (EUA). This EUA will remain in effect (meaning this test can be used) for the duration of the COVID-19 declaration under Section 564(b)(1) of the Act, 21 U.S.C. section 360bbb-3(b)(1), unless the authorization is terminated or revoked.  Performed at [facility], [address] [place], [place] [address]   Urine Culture     Status: None   Collection Time: 12/14/20  3:15 PM   Specimen: Urine, Random  Result Value Ref Range Status   Specimen Description   Final    URINE, RANDOM Performed at [facility], [address] [place], [place] [address]    Special Requests   Final    NONE Performed at [facility], [address], [place], [place] [address]    Culture   Final    NO GROWTH Performed at [facility], [address], [place], [place] [address]    Report Status 12/15/2020 FINAL  Final  CULTURE, BLOOD (ROUTINE X 2) w Reflex to ID Panel     Status: None (Preliminary result)   Collection Time: 12/16/20  8:17 AM   Specimen: BLOOD  Result Value Ref Range Status   Specimen Description BLOOD RIGHT  [facility]  Final   Special Requests   Final    BOTTLES DRAWN AEROBIC AND ANAEROBIC Blood Culture adequate volume   Culture   Final    NO GROWTH 3 DAYS Performed at [facility], [address] [place], [place] [address]    Report Status PENDING  Incomplete  CULTURE, BLOOD (ROUTINE X 2) w Reflex to ID Panel     Status: None (Preliminary result)   Collection Time: 12/16/20  8:27 AM   Specimen: BLOOD  Result Value Ref Range Status   Specimen Description BLOOD RIGHT WRIST  Final   Special Requests   Final    BOTTLES DRAWN AEROBIC AND ANAEROBIC Blood Culture results may not be optimal due to an inadequate volume of blood received in culture bottles   Culture   Final    NO GROWTH 3 DAYS Performed at [facility], [address], [place], [place] [address]    Report Status PENDING  Incomplete  Resp Panel by RT-PCR (Flu A&B, Covid) Nasopharyngeal Swab     Status: None   Collection Time: 12/16/20  9:44 AM   Specimen: Nasopharyngeal Swab; Nasopharyngeal(NP) swabs in vial transport medium  Result Value Ref Range Status   SARS Coronavirus 2 by RT PCR NEGATIVE NEGATIVE Final    Comment: (NOTE) SARS-CoV-2 target nucleic acids are NOT DETECTED.  The SARS-CoV-2 RNA is generally detectable in upper respiratory specimens during the acute phase of infection. The lowest concentration of SARS-CoV-2 viral copies this assay can detect is 138 copies/mL. A negative result does not preclude SARS-Cov-2 infection and should not be used as the sole basis for treatment or other patient management decisions. A negative result may occur with  improper specimen collection/handling, submission of specimen other than nasopharyngeal swab, presence of viral mutation(s) within the areas targeted by this assay, and inadequate number of viral copies(<138 copies/mL). A negative result must be combined with clinical observations, patient history, and epidemiological information. The expected result is  Negative.  Fact Sheet for Patients:  EntrepreneurPulse.com.au  Fact Sheet for  Healthcare Providers:  IncredibleEmployment.be  This test is no t yet approved or cleared by the [place] and  has been authorized for detection and/or diagnosis of SARS-CoV-2 by FDA under an Emergency Use Authorization (EUA). This EUA will remain  in effect (meaning this test can be used) for the duration of the COVID-19 declaration under Section 564(b)(1) of the Act, 21 U.S.C.section 360bbb-3(b)(1), unless the authorization is terminated  or revoked sooner.       Influenza A by PCR NEGATIVE NEGATIVE Final   Influenza B by PCR NEGATIVE NEGATIVE Final    Comment: (NOTE) The Xpert Xpress SARS-CoV-2/FLU/RSV plus assay is intended as an aid in the diagnosis of influenza from Nasopharyngeal swab specimens and should not be used as a sole basis for treatment. Nasal washings and aspirates are unacceptable for Xpert Xpress SARS-CoV-2/FLU/RSV testing.  Fact Sheet for Patients: EntrepreneurPulse.com.au  Fact Sheet for Healthcare Providers: IncredibleEmployment.be  This test is not yet approved or cleared by the [place] FDA and has been authorized for detection and/or diagnosis of SARS-CoV-2 by FDA under an Emergency Use Authorization (EUA). This EUA will remain in effect (meaning this test can be used) for the duration of the COVID-19 declaration under Section 564(b)(1) of the Act, 21 U.S.C. section 360bbb-3(b)(1), unless the authorization is terminated or revoked.  Performed at [facility], [address] [place], [place] [address]          Radiology Studies: MR ANGIO HEAD WO CONTRAST  Result Date: 12/19/2020 CLINICAL DATA:  Carotid artery stenosis EXAM: MRA HEAD WITHOUT CONTRAST TECHNIQUE: Angiographic images of the Circle of Willis were acquired using MRA technique without intravenous contrast.  COMPARISON:  No pertinent prior exam. FINDINGS: POSTERIOR CIRCULATION: --Vertebral arteries: Normal --Inferior cerebellar arteries: Normal. --Basilar artery: Normal. --Superior cerebellar arteries: Normal. --Posterior cerebral arteries: Moderate stenosis of the left proximal P2 segment. Otherwise normal. ANTERIOR CIRCULATION: --Intracranial internal carotid arteries: Normal. --Anterior cerebral arteries (ACA): Normal. --Middle cerebral arteries (MCA): Normal. ANATOMIC VARIANTS: None IMPRESSION: 1. No intracranial arterial occlusion or high-grade stenosis. 2. Moderate stenosis of the left proximal P2 segment. Electronically Signed   By: Ulyses Jarred M.D.   On: 12/19/2020 00:13   MR ANGIO NECK WO CONTRAST  Result Date: 12/19/2020 CLINICAL DATA:  Carotid artery stenosis EXAM: MRA NECK WITHOUT CONTRAST TECHNIQUE: Angiographic images of the neck were acquired using MRA technique without intravenous contrast. Carotid stenosis measurements (when applicable) are obtained utilizing NASCET criteria, using the distal internal carotid diameter as the denominator. COMPARISON:  Carotid ultrasound 12/11/2020 FINDINGS: Aortic arch: 4 vessel branch pattern with independent origin of the left vertebral artery. Right carotid system: 60% stenosis due to eccentric atherosclerotic plaque within the proximal ICA. Left carotid system: 30% stenosis the proximal left ICA. Vertebral arteries: Codominant.  Normal. Other: None. IMPRESSION: 1. 60% stenosis of the proximal right ICA due to eccentric atherosclerotic plaque. 2. 30% stenosis of the proximal left ICA. Electronically Signed   By: Ulyses Jarred M.D.   On: 12/19/2020 00:31   US Abdomen Limited RUQ (LIVER/GB)  Result Date: 12/18/2020 CLINICAL DATA:  Abnormal LFTs. EXAM: ULTRASOUND ABDOMEN LIMITED RIGHT UPPER QUADRANT COMPARISON:  None. FINDINGS: Gallbladder: No gallstones or wall thickening visualized. No sonographic Murphy sign noted by sonographer. Common bile duct: Diameter: 2  mm Liver: No focal lesion identified. Within normal limits in parenchymal echogenicity. Portal vein is patent on color Doppler imaging with normal direction of blood flow towards the liver. Other: None. IMPRESSION: Unremarkable study. No findings to  explain the history of abnormal LFTs. Electronically Signed   By: Misty Stanley M.D.   On: 12/18/2020 10:21        Scheduled Meds:  amLODipine  5 mg Oral Daily   Chlorhexidine Gluconate Cloth  6 each Topical Daily   clopidogrel  75 mg Oral Daily   docusate sodium  200 mg Oral BID   enoxaparin (LOVENOX) injection  30 mg Subcutaneous Q24H   feeding supplement (GLUCERNA SHAKE)  237 mL Oral TID   fluticasone  1 spray Each Nare Daily   insulin aspart  0-15 Units Subcutaneous TID WC   insulin aspart  0-5 Units Subcutaneous QHS   isosorbide mononitrate  60 mg Oral Daily   polyethylene glycol  17 g Oral Daily   sodium chloride flush  3 mL Intravenous Q12H   timolol  1 drop Both Eyes QHS   vitamin B-12  100 mcg Oral Daily   Continuous Infusions:   LOS: 6 days    Time spent: 35 min with >50% on coc    Nolberto Hanlon, MD [facility] Pager [phone]  If 7PM-7AM, please contact night-coverage 12/19/2020, 2:59 PM

## 2020-12-19 NOTE — Consult Note (Signed)
Reason for Consult: Elevated inflammation test and right wrist swelling  Referring Physician: Hospitalist  Theador Hawthorne Argueta   HPI: Poor historian.  History from chart and from son and from patient.  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  History of diabetes and coronary disease.  Known cervical disc disease.  X-rays have shown some significant changes.  She has had problems for years according to the son.  Worse recently.  No particular headache.  Negative no jaw pain. Had episode of syncope.  Hospitalized.  Has had mild confusion since.  During hospitalization she has had swollen right wrist.  Hurts minimally.  Worse when she moves it.  Also some pain in left and right hip.  Unsure about the knee.  Left hand is not been painful.  Fever.  White count 13,000.  Elevated liver related sed rate at 126 uric acid at 6.5 and CRP of 25. CT of the cervical spine showed degenerative changes but also some hands including.  Sugars been running high.  She has not had any steroid Carotid ultrasound unremarkable.  Some vascular disease but not felt to be significant intracranially.  No definite stroke.  PMH: Diabetes.  Coronary disease.  Cervical disc disease.  SURGICAL HISTORY: No joint surgery Family History: Family history negative for connective tissue disease  Social History: No cigarettes or alcohol  Allergies:  Allergies  Allergen Reactions   Butenafine Swelling    Tongue swelled and turned red   Levofloxacin Swelling and Other (See Comments)    Other reaction(s): Other (See Comments) Tongue swelled and turned red   Limonene Other (See Comments)    Other reaction(s): Other (See Comments) Tongue swelled and turned red Tongue swelled and turned red    Penicillins     Other reaction(s): Unknown   Terbinafine And Related Other (See Comments)    Tongue swelled and turned red    Medications: Scheduled:  amLODipine  5 mg Oral Daily   Chlorhexidine Gluconate Cloth  6 each Topical  Daily   clopidogrel  75 mg Oral Daily   docusate sodium  200 mg Oral BID   [START ON 12/20/2020] enoxaparin (LOVENOX) injection  40 mg Subcutaneous Q24H   feeding supplement (GLUCERNA SHAKE)  237 mL Oral TID   fluticasone  1 spray Each Nare Daily   insulin aspart  0-15 Units Subcutaneous TID WC   insulin aspart  0-5 Units Subcutaneous QHS   isosorbide mononitrate  60 mg Oral Daily   polyethylene glycol  17 g Oral Daily   sodium chloride flush  3 mL Intravenous Q12H   timolol  1 drop Both Eyes QHS   vitamin B-12  100 mcg Oral Daily        ROS: No abdominal pain.  No chest pain.  No recent shortness of breath.  No rash.  Joints have not been swollen prior to this hospitalization according to the son   PHYSICAL EXAM: Blood pressure 114/65, pulse 89, temperature 98.2 F (36.8 C), resp. rate 18, height 5\' 6"  (1.676 m), weight 58.3 kg, SpO2 99 %. Pleasant female.  Holds her neck in flexion.  Nontender temporal arteries.  No definite rash.  Good carotid upstroke.  Clear chest.  Regular rhythm.  Nontender abdomen.  Trace edema Musculoskeletal marked decreased range of motion cervical spine.  Shoulders had a mild painful arc of motion.  Elbows without synovitis or nodules.  Right wrist has erythema.  Pain with flexion extension.  MCPs nontender.  Left wrist without  synovitis.  MCPs without synovitis.  No dactylitis.  Both hips have mild painful arc of motion.  Bilateral knee effusions the right is minimally warm.  Ankles and toes without synovitis.  First MTPs nontender.  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  The pannus on CT of C1-2 raises the question of whether she could have crowned dens syndrome and acute CPPD.  I reviewed the CT with radiology and did not see significant calcification.  I reviewed the right wrist down and there was a faint calcification in the lateral wrist but no significant chondrocalcinosis.  Uric acid is 6.5 which is in the range that could be  gouty No history to suggest chronic rheumatoid.  Syncope.  Probably unrelated  Abnormal liver functions.  Unclear etiology Significant cervical disc disease  Recommendations:: Procedure: Right wrist prepped in sterile manner.  Aspirate dry  Procedure #2; right knee prepped in sterile manner.  Aspirated 8 cc noninflammatory fluid.  Sent for microscopy.  Look for crystals  Consider prednisone taper    Kandyce Rud 12/19/2020, 6:33 PM

## 2020-12-20 LAB — RESP PANEL BY RT-PCR (FLU A&B, COVID) ARPGX2
Influenza A by PCR: NEGATIVE
Influenza B by PCR: NEGATIVE
SARS Coronavirus 2 by RT PCR: NEGATIVE

## 2020-12-20 LAB — GLUCOSE, CAPILLARY
Glucose-Capillary: 179 mg/dL — ABNORMAL HIGH (ref 70–99)
Glucose-Capillary: 234 mg/dL — ABNORMAL HIGH (ref 70–99)

## 2020-12-20 MED ORDER — ISOSORBIDE MONONITRATE ER 60 MG PO TB24: 60.0000 mg | ORAL_TABLET | Freq: Every day | ORAL | Status: AC

## 2020-12-20 MED ORDER — ASPIRIN 81 MG PO TBEC: 81.0000 mg | DELAYED_RELEASE_TABLET | Freq: Every day | ORAL | 11 refills | Status: AC

## 2020-12-20 MED ORDER — LIDOCAINE 5 % EX PTCH: 1.0000 | MEDICATED_PATCH | Freq: Every day | CUTANEOUS | 0 refills | Status: DC | PRN

## 2020-12-20 MED ORDER — AMLODIPINE BESYLATE 10 MG PO TABS: 10.0000 mg | ORAL_TABLET | Freq: Every day | ORAL | Status: AC

## 2020-12-20 MED ORDER — FLUTICASONE PROPIONATE 50 MCG/ACT NA SUSP: 1.0000 | Freq: Every day | NASAL | 2 refills | Status: AC

## 2020-12-20 MED ORDER — PANTOPRAZOLE SODIUM 20 MG PO TBEC
20.0000 mg | DELAYED_RELEASE_TABLET | Freq: Every day | ORAL | Status: DC
  Administered 2020-12-20: 20 mg via ORAL
  Filled 2020-12-20: qty 1

## 2020-12-20 MED ORDER — DOCUSATE SODIUM 100 MG PO CAPS: 200.0000 mg | ORAL_CAPSULE | Freq: Two times a day (BID) | ORAL | 0 refills | Status: AC

## 2020-12-20 MED ORDER — POLYETHYLENE GLYCOL 3350 17 G PO PACK: 17.0000 g | PACK | Freq: Every day | ORAL | 0 refills | Status: DC

## 2020-12-20 MED ORDER — AMLODIPINE BESYLATE 10 MG PO TABS
10.0000 mg | ORAL_TABLET | Freq: Every day | ORAL | Status: DC
  Administered 2020-12-20: 10 mg via ORAL
  Filled 2020-12-20: qty 1

## 2020-12-20 MED ORDER — PREDNISONE 20 MG PO TABS: 60.0000 mg | ORAL_TABLET | Freq: Every day | ORAL | Status: DC

## 2020-12-20 MED ORDER — PREDNISONE 50 MG PO TABS
60.0000 mg | ORAL_TABLET | Freq: Every day | ORAL | Status: DC
  Administered 2020-12-20: 60 mg via ORAL
  Filled 2020-12-20: qty 1

## 2020-12-20 MED ORDER — ASPIRIN EC 81 MG PO TBEC
81.0000 mg | DELAYED_RELEASE_TABLET | Freq: Every day | ORAL | Status: DC
  Administered 2020-12-20: 81 mg via ORAL
  Filled 2020-12-20: qty 1

## 2020-12-20 NOTE — Progress Notes (Signed)
Rheumatology follow-up Hand much better.  Knee not as swollen.  Fluid showed CPPD crystals Some improvement in the neck pain.  Physical therapy is getting her up Sugar less than 200 Exam: Minimal synovitis right wrist.  Mild effusion on right and left.  Less painful with motion Impression: Acute polyarticular CPPD Plan: Agree with short prednisone taper.  Usually illness is self-limited without recurrence.  Happy to see her as an outpatient if she has more symptoms.

## 2020-12-20 NOTE — Progress Notes (Signed)
Patient is being discharged to [facility].Report was called. Discharge instructions is in  the discharge packet for her transportation with EMs to facility

## 2020-12-20 NOTE — TOC Transition Note (Signed)
Transition of Care [facility]) - CM/SW Discharge Note   Patient Details  Name: [name] MRN: [id number] Date of Birth: [birth year]  [facility] CM/SW Contact:  Gildardo Griffes, LCSW Phone Number: 12/20/2020, 2:17 PM   Clinical Narrative:     Patient will DC to: [facility] Anticipated DC date: 12/20/20 Family notified: son Darliss Ridgel byWendie Simmer  Per MD patient ready for DC to  [facility]. RN, patient, patient's family, and facility notified of DC. Discharge Summary sent to facility. RN given number for report  [phone] ask for 700 hall nurse, room 713. DC packet on chart. Ambulance transport requested for patient.  CSW signing off.  Angeline Slim, LCSW    Final next level of care: Skilled Nursing Facility Barriers to Discharge: No Barriers Identified   Patient Goals and CMS Choice Patient states their goals for this hospitalization and ongoing recovery are:: to go to snf CMS Medicare.gov Compare Post Acute Care list provided to:: Patient Represenative (must comment) (son [name] and [name]) Choice offered to / list presented to : Adult Children  Discharge Placement              Patient chooses bed at: [facility] [place] Patient to be transferred to facility by: [facility] Name of family member notified: [name] and [name] Patient and family notified of of transfer: 12/20/20  Discharge Plan and Services     Post Acute Care Choice: Skilled Nursing Facility                               Social Determinants of Health (SDOH) Interventions     Readmission Risk Interventions No flowsheet data found.

## 2020-12-20 NOTE — Discharge Summary (Signed)
Cyrilla Durkin Smolen DTO:[id number] DOB: 1930-07-05 DOA: 12/10/2020  PCP: Rusty Aus, MD  Admit date: 12/10/2020 Discharge date: 12/20/2020  Admitted From: Home Disposition: SNF  Recommendations for Outpatient Follow-up:  Follow up with PCP in 1 week Please obtain BMP/CBC in one week Please follow up with vascular surgery as scheduled   Discharge Condition:Stable CODE STATUS:FULL  Diet recommendation: Heart Healthy / Carb Modified   Brief/Interim Summary: Per XIP:JASNKN S Bielefeld is a 87 y.o. female with medical history significant for DM, HTN, CAD, CKD 3B with history of stent placement, prior admission in 2021 for syncope related to beta-blocker induced bradycardia who presents to the emergency room following a syncopal episode.  History is limited as patient has no recollection of the event.  States she last remembers sitting at the dining table about to have a meal and she remembers feeling like she was about to pass out and the next thing she remembered was being in the hospital.  Does not remember the ambulance ride clearly.  She did state that the day prior she forgot to take her morning medications and took them at 11:30 that night, and on the day of arrival instead of taking the a.m. medications in the morning a she took them at 5:30 the evening while sitting at the dining room table.   Said she was in her usual state of health prior to the event.  ED course: On arrival, afebrile, pulse 66, BP 158/50 with O2 sat 97% on room air Blood work with WBC 10,800, normal hemoglobin.  Creatinine 1.36, which appears to be her baseline.  Troponin of 8.  Urinalysis unremarkable.  COVID and flu negative.  CT of the head no acute intracranial abnormality. CT of the cervical spine:1. No acute fracture or subluxation of the cervical spine. 2. Stable degenerative disc disease and facet hypertrophy. She was admitted to the hospital for syncope.  Hospital course from Dr. Jimmye Norman 8/24-8/30/22: Pt presented after  syncopal episode of unknown etiology. W/ hx of prior syncopal event related to medication & has hx of carotid stenosis. Takes multiple anti-HTN meds including amlodipine, clonidine, imdur, aldactone, torsemide. Echo shows EF 39-76%, normal diastolic function & no regional wall motion abnormalities. Furthermore, pt has been c/o pain over entire body and infectious work-up so far has been neg. WBC iwas elevated but trended down but pt is spiking fevers. CXR was neg, urine cx neg, blood cxs neg and repeat COVID19 & influenza tests were neg. ID consulted.  She was complaining of right hand pain.  Did not look like infection.  Rheumatology was consulted and was recommended to start on prednisone for pseudogout.   Syncope: etiology unclear. W/ hx of prior syncopal event related to medication.  Takes multiple anti-HTN meds including amlodipine, clonidine, imdur, aldactone, torsemide. Echo shows EF 73-41%, normal diastolic function & no regional wall motion abnormalities.  Patients  K level  was trending upward.  Off torsemide.  She is euvolemic on exam.  Her Aldactone was discontinued. Continue on Imdur and amlodipine Her torsemide was also discontinued Will need to follow-up with primary care for further evaluation She has history of carotid stenosis but this was not the cause of her syncope         Carotid stenosis-  Vascular surgery was consulted input was appreciated. Continue Plavix and aspirin Statins were held due to elevated LFTs.  May need to reinitiate once LFTs normalized, will defer to primary care to reevaluate as outpatient setting  Elevated LFT-  Abd [place] neg.  Crestor was discontinued.   Avoid hepatotoxic meds for now  Follow-up with PCP for further evaluation we will need to check blood work within 3 days          Lt hand pain Fevers Leukocytosis: Blood cxs NGTD, urine cx no growth, CXR neg for pneumonia, & repeat COVID19 & influenza are both neg. ID consulted Wbc  trending down. hand  mildly painful , swollen.  Xr no fx. CRP /ESR up Consulted rheumatology -fluid with CPPD crystals.Has minimal synovitis of right rist. Mild effusion on rt and left, less painful with motion now . Was diagnosied with acute polyarticular CPPD Rheumatology recommended short prednisone taper.   Started prednisone 21m po daily on 9/2, do another 646mtomorrow, then 4072mn 9/4, then 58m37mily on 9/5 and then stop. PPI for GI ppx. Monitor sugars with RISS since on steroids       Neck pain: acute on chronic. No mention of neck pain today. CT neck on admission showed No acute fracture or subluxation of the cervical spine. Stable degenerative disc disease and facet hypertrophy.       Encephalopathy: etiology unclear, metabolic vs toxic. Infectious work-up so far has been neg. CT head on admission was neg for acute intracranial findings. Narcotics/muscle relaxants were d/c 8/28 likely contributing encephalopathy. MS better.   HTN: on amlodipine and imdur.  F/u with pcp for further mx   Hx of CAD: no chest pain. Continue on imdur, asa.  Statin on hold due to elevated LFT   DM2: HbA1c 7.6, poorly controlled. Continue on SSI w/ accuchecks    CKD stage III a likely Creatinine 1.16 Avoid nephrotoxic meds Discontinue foley over the weekened voiding trial.   Discharge Diagnoses:  Active Problems:   Carotid stenosis   Essential hypertension   CAD (coronary artery disease)   Type 2 diabetes mellitus (HCC)[place]   Syncope    Discharge Instructions  Discharge Instructions     Diet - low sodium heart healthy   Complete by: As directed    Increase activity slowly   Complete by: As directed       Allergies as of 12/20/2020       Reactions   Butenafine Swelling   Tongue swelled and turned red   Levofloxacin Swelling, Other (See Comments)   Other reaction(s): Other (See Comments) Tongue swelled and turned red   Limonene Other (See Comments)    Other reaction(s): Other (See Comments) Tongue swelled and turned red Tongue swelled and turned red   Penicillins    Other reaction(s): Unknown   Terbinafine And Related Other (See Comments)   Tongue swelled and turned red        Medication List     STOP taking these medications    acetaminophen 500 MG tablet Commonly known as: TYLENOL   bisacodyl 5 MG EC tablet Commonly known as: DULCOLAX   cloNIDine 0.1 mg/24hr patch Commonly known as: CATAPRES - Dosed in mg/24 hr   gabapentin 100 MG capsule Commonly known as: NEURONTIN   glimepiride 1 MG tablet Commonly known as: AMARYL   metoprolol tartrate 25 MG tablet Commonly known as: LOPRESSOR   MUCINEX DM PO   MULTIVITAMIN GUMMIES ADULT PO   rosuvastatin 20 MG tablet Commonly known as: CRESTOR   spironolactone 25 MG tablet Commonly known as: ALDACTONE   tiZANidine 2 MG tablet Commonly known as: ZANAFLEX   torsemide 20 MG tablet Commonly known  as: DEMADEX   triamcinolone cream 0.1 % Commonly known as: KENALOG       TAKE these medications    amLODipine 10 MG tablet Commonly known as: NORVASC Take 1 tablet (10 mg total) by mouth daily. Start taking on: December 21, 2020 What changed:  medication strength how much to take when to take this   aspirin 81 MG EC tablet Take 1 tablet (81 mg total) by mouth daily. Swallow whole. Start taking on: December 21, 2020 What changed: You were already taking a medication with the same name, and this prescription was added. Make sure you understand how and when to take each.   aspirin EC 81 MG tablet Take 81 mg by mouth daily. What changed: Another medication with the same name was added. Make sure you understand how and when to take each.   clopidogrel 75 MG tablet Commonly known as: PLAVIX Take 75 mg by mouth daily.   docusate sodium 100 MG capsule Commonly known as: COLACE Take 2 capsules (200 mg total) by mouth 2 (two) times daily. What changed:  how  much to take when to take this reasons to take this   donepezil 10 MG tablet Commonly known as: ARICEPT Take 10 mg by mouth daily.   fluticasone 50 MCG/ACT nasal spray Commonly known as: FLONASE Place 1 spray into both nostrils daily. Start taking on: December 21, 2020 What changed: how much to take   isosorbide mononitrate 60 MG 24 hr tablet Commonly known as: IMDUR Take 1 tablet (60 mg total) by mouth daily. Start taking on: December 21, 2020   lidocaine 5 % Commonly known as: LIDODERM Place 1 patch onto the skin daily as needed (back or neck or leg pain). Remove & Discard patch within 12 hours or as directed by MD   magnesium oxide 400 MG tablet Commonly known as: MAG-OX Take 400 mg by mouth daily.   nitroGLYCERIN 0.4 MG SL tablet Commonly known as: NITROSTAT Place 0.4 mg under the tongue every 5 (five) minutes as needed. For chest pain   omeprazole 20 MG capsule Commonly known as: PRILOSEC Take 20 mg by mouth daily.   polyethylene glycol 17 g packet Commonly known as: MIRALAX / GLYCOLAX Take 17 g by mouth daily. Start taking on: December 21, 2020   predniSONE 20 MG tablet Commonly known as: DELTASONE Take 3 tablets (60 mg total) by mouth daily with breakfast. Start taking on: December 21, 2020   timolol 0.5 % ophthalmic solution Commonly known as: TIMOPTIC Place 1 drop into both eyes at bedtime.   vitamin B-12 100 MCG tablet Commonly known as: CYANOCOBALAMIN Take 100 mcg by mouth daily.        Follow-up Information     Schnier, Dolores Lory, MD Follow up in 6 month(s).   Specialties: Vascular Surgery, Cardiology, Radiology, Vascular Surgery Why: Can see Schnier or Arna Medici. Carotid stenosis. Will need carotid duplex with visit. Contact information: [place] [address] [phone]         Rusty Aus, MD Follow up in 1 week(s).   Specialty: Internal Medicine Contact information: [place] [address] [phone]                Allergies  Allergen Reactions   Butenafine Swelling    Tongue swelled and turned red   Levofloxacin Swelling and Other (See Comments)    Other reaction(s): Other (See Comments) Tongue swelled and turned red  Limonene Other (See Comments)    Other reaction(s): Other (See Comments) Tongue swelled and turned red Tongue swelled and turned red    Penicillins     Other reaction(s): Unknown   Terbinafine And Related Other (See Comments)    Tongue swelled and turned red    Consultations: ID , rheumatology   Procedures/Studies: DG Wrist Complete Right  Result Date: 12/17/2020 CLINICAL DATA:  RIGHT wrist pain. EXAM: RIGHT WRIST - COMPLETE 3+ VIEW COMPARISON:  None FINDINGS: Degenerative changes throughout the wrist. Most pronounced in the first carpometacarpal joint. No sign of acute fracture. No dislocation. No substantial soft tissue swelling. IMPRESSION: Degenerative changes, no acute findings. Electronically Signed   By: Zetta Bills M.D.   On: 12/17/2020 14:02   CT HEAD WO CONTRAST (5MM)  Result Date: 12/10/2020 CLINICAL DATA:  Syncope, fall EXAM: CT HEAD WITHOUT CONTRAST TECHNIQUE: Contiguous axial images were obtained from the base of the skull through the vertex without intravenous contrast. COMPARISON:  08/26/2020 FINDINGS: Brain: There is atrophy and chronic small vessel disease changes. No acute intracranial abnormality. Specifically, no hemorrhage, hydrocephalus, mass lesion, acute infarction, or significant intracranial injury. Vascular: No hyperdense vessel or unexpected calcification. Skull: No acute calvarial abnormality. Sinuses/Orbits: Visualized paranasal sinuses and mastoids clear. Orbital soft tissues unremarkable. Other: None IMPRESSION: Atrophy, chronic microvascular disease. No acute intracranial abnormality. Electronically Signed   By: Rolm Baptise M.D.   On: 12/10/2020 21:39   CT Cervical  Spine Wo Contrast  Result Date: 12/10/2020 CLINICAL DATA:  Neck trauma (Age >= 65y) Syncope, fall from standing position. EXAM: CT CERVICAL SPINE WITHOUT CONTRAST TECHNIQUE: Multidetector CT imaging of the cervical spine was performed without intravenous contrast. Multiplanar CT image reconstructions were also generated. COMPARISON:  CT cervical spine 08/26/2020 FINDINGS: Alignment: Stable from prior exam. No traumatic subluxation. Similar grade 1 anterolisthesis of C2 on C3 and C4 on C5. Skull base and vertebrae: No acute fracture. The dens and skull base are intact. Soft tissues and spinal canal: No prevertebral fluid or swelling. No visible canal hematoma. Disc levels: Diffuse degenerative disc disease and facet hypertrophy. Stable degenerative change from prior. C1-C2 degenerative change with mild pannus. Upper chest: No acute findings. Other: Carotid calcifications. IMPRESSION: 1. No acute fracture or subluxation of the cervical spine. 2. Stable degenerative disc disease and facet hypertrophy. Electronically Signed   By: Keith Rake M.D.   On: 12/10/2020 21:53   MR ANGIO HEAD WO CONTRAST  Result Date: 12/19/2020 CLINICAL DATA:  Carotid artery stenosis EXAM: MRA HEAD WITHOUT CONTRAST TECHNIQUE: Angiographic images of the Circle of Willis were acquired using MRA technique without intravenous contrast. COMPARISON:  No pertinent prior exam. FINDINGS: POSTERIOR CIRCULATION: --Vertebral arteries: Normal --Inferior cerebellar arteries: Normal. --Basilar artery: Normal. --Superior cerebellar arteries: Normal. --Posterior cerebral arteries: Moderate stenosis of the left proximal P2 segment. Otherwise normal. ANTERIOR CIRCULATION: --Intracranial internal carotid arteries: Normal. --Anterior cerebral arteries (ACA): Normal. --Middle cerebral arteries (MCA): Normal. ANATOMIC VARIANTS: None IMPRESSION: 1. No intracranial arterial occlusion or high-grade stenosis. 2. Moderate stenosis of the left proximal P2  segment. Electronically Signed   By: Ulyses Jarred M.D.   On: 12/19/2020 00:13   MR ANGIO NECK WO CONTRAST  Result Date: 12/19/2020 CLINICAL DATA:  Carotid artery stenosis EXAM: MRA NECK WITHOUT CONTRAST TECHNIQUE: Angiographic images of the neck were acquired using MRA technique without intravenous contrast. Carotid stenosis measurements (when applicable) are obtained utilizing NASCET criteria, using the distal internal carotid diameter as the denominator. COMPARISON:  Carotid ultrasound 12/11/2020 FINDINGS:  Aortic arch: 4 vessel branch pattern with independent origin of the left vertebral artery. Right carotid system: 60% stenosis due to eccentric atherosclerotic plaque within the proximal ICA. Left carotid system: 30% stenosis the proximal left ICA. Vertebral arteries: Codominant.  Normal. Other: None. IMPRESSION: 1. 60% stenosis of the proximal right ICA due to eccentric atherosclerotic plaque. 2. 30% stenosis of the proximal left ICA. Electronically Signed   By: Ulyses Jarred M.D.   On: 12/19/2020 00:31   DG Hand 2 View Left  Result Date: 12/15/2020 CLINICAL DATA:  Pain after fall. EXAM: LEFT HAND - 2 VIEW COMPARISON:  March 15, 2019 FINDINGS: There is no evidence of fracture or dislocation. There is no evidence of arthropathy or other focal bone abnormality. Soft tissues are unremarkable. IMPRESSION: Negative. Electronically Signed   By: Dorise Bullion III M.D.   On: 12/15/2020 20:26   US Carotid Bilateral  Result Date: 12/11/2020 CLINICAL DATA:  Initial evaluation for syncope. EXAM: BILATERAL CAROTID DUPLEX ULTRASOUND TECHNIQUE: Pearline Cables scale imaging, color Doppler and duplex ultrasound were performed of bilateral carotid and vertebral arteries in the neck. COMPARISON:  Prior CTA from 09/06/2012. FINDINGS: Criteria: Quantification of carotid stenosis is based on velocity parameters that correlate the residual internal carotid diameter with NASCET-based stenosis levels, using the diameter of the  distal internal carotid lumen as the denominator for stenosis measurement. The following velocity measurements were obtained: RIGHT ICA: 200/26 cm/sec CCA: 109/32 cm/sec SYSTOLIC ICA/CCA RATIO:  2.1 ECA: 182 cm/sec LEFT ICA: 131/24 cm/sec CCA: 355/73 cm/sec SYSTOLIC ICA/CCA RATIO:  1.4 ECA: 282 cm/sec RIGHT CAROTID ARTERY: Scattered intimal thickening seen within the visualized right CCA without significant stenosis. Moderate heterogeneous echogenic plaque seen about the right carotid bulb, extending into the proximal right ICA. Elevation in peak systolic velocity up to 220 cm/sec, with systolic ICA/CCA ratio of 2.1, consistent with a 50-69% stenosis. Visualized right ICA irregular but patent distally. Severe stenosis at the origin of the right external carotid artery noted as well. RIGHT VERTEBRAL ARTERY:  Antegrade. LEFT CAROTID ARTERY: Smooth intimal thickening seen within the visualized left CCA without significant stenosis. Heterogeneous echogenic calcified plaque about the left carotid bulb extending into the proximal left ICA. Elevation in peak systolic velocity up to 254 cm/sec, consistent with a 50-69% stenosis. Partially visualized left ICA patent distally. Severe stenosis at the origin of the left external carotid artery noted as well. LEFT VERTEBRAL ARTERY:  Antegrade. IMPRESSION: 1. Heavy calcified plaque about the carotid bulbs/proximal ICAs, with estimated stenoses of up to 50-69% bilaterally, right worse than left. Correlation with dedicated CTA could be performed for further evaluation as warranted. 2. Severe stenoses at the origins of both external carotid arteries. 3. Patent antegrade flow within the vertebral arteries bilaterally. Electronically Signed   By: Jeannine Boga M.D.   On: 12/11/2020 04:31   DG Chest Port 1 View  Result Date: 12/14/2020 CLINICAL DATA:  Syncope and fall, concern for pneumonia. EXAM: PORTABLE CHEST 1 VIEW COMPARISON:  Chest radiograph dated 08/26/2020.  FINDINGS: The heart size is normal. Vascular calcifications are seen in the aortic arch. The left costophrenic angle is obscured which may reflect a trace pleural effusion and or atelectasis/airspace disease. The right lung is clear with no pleural effusion. There is no pneumothorax. Degenerative changes are seen in the spine. IMPRESSION: Obscured left costophrenic angle may reflect trace pleural effusion or atelectasis/airspace disease. Electronically Signed   By: Zerita Boers M.D.   On: 12/14/2020 10:09   ECHOCARDIOGRAM COMPLETE  Result  Date: 12/11/2020    ECHOCARDIOGRAM REPORT   Patient Name:   HADEEL HILLEBRAND Parran Date of Exam: 12/11/2020 Medical Rec #:  [id number]     Height:       66.0 in Accession #:    [id number]    Weight:       134.9 lb Date of Birth:  20-May-1930     BSA:          1.692 m Patient Age:    87 years      BP:           157/62 mmHg Patient Gender: F             HR:           79 bpm. Exam Location:  [facility] Procedure: 2D Echo, Color Doppler and Cardiac Doppler Indications:     R55 Syncope  History:         Patient has prior history of Echocardiogram examinations, most                  recent 11/20/2019. CAD, CKD; Risk Factors:Hypertension, HCL and                  Diabetes.  Sonographer:     Charmayne Sheer Referring Phys:  [id number] Athena Masse Diagnosing Phys: Yolonda Kida MD IMPRESSIONS  1. Normal LVF.  2. Normal Wall Motion.  3. Left ventricular ejection fraction, by estimation, is 55 to 60%. The left ventricle has normal function. The left ventricle has no regional wall motion abnormalities. Left ventricular diastolic parameters were normal.  4. Right ventricular systolic function is normal. The right ventricular size is normal.  5. The mitral valve is grossly normal. No evidence of mitral valve regurgitation.  6. The aortic valve is normal in structure. Aortic valve regurgitation is not visualized. FINDINGS  Left Ventricle: Left ventricular ejection fraction, by estimation, is 55 to 60%. The left  ventricle has normal function. The left ventricle has no regional wall motion abnormalities. The left ventricular internal cavity size was normal in size. There is  no left ventricular hypertrophy. Left ventricular diastolic parameters were normal. Right Ventricle: The right ventricular size is normal. No increase in right ventricular wall thickness. Right ventricular systolic function is normal. Left Atrium: Left atrial size was normal in size. Right Atrium: Right atrial size was normal in size. Pericardium: There is no evidence of pericardial effusion. Mitral Valve: The mitral valve is grossly normal. No evidence of mitral valve regurgitation. MV peak gradient, 10.1 mmHg. The mean mitral valve gradient is 4.0 mmHg. Tricuspid Valve: The tricuspid valve is normal in structure. Tricuspid valve regurgitation is not demonstrated. Aortic Valve: The aortic valve is normal in structure. Aortic valve regurgitation is not visualized. Aortic valve mean gradient measures 7.0 mmHg. Aortic valve peak gradient measures 13.5 mmHg. Aortic valve area, by VTI measures 2.21 cm. Pulmonic Valve: The pulmonic valve was normal in structure. Pulmonic valve regurgitation is not visualized. Aorta: The ascending aorta was not well visualized. IAS/Shunts: No atrial level shunt detected by color flow Doppler. Additional Comments: Normal LVF. Normal Wall Motion.  LEFT VENTRICLE PLAX 2D LVIDd:         3.68 cm  Diastology LVIDs:         2.53 cm  LV e' medial:    5.55 cm/s LV PW:         0.90 cm  LV E/e' medial:  16.5 LV IVS:  0.93 cm  LV e' lateral:   5.55 cm/s LVOT diam:     1.80 cm  LV E/e' lateral: 16.5 LV SV:         79 LV SV Index:   47 LVOT Area:     2.54 cm  RIGHT VENTRICLE RV Basal diam:  2.45 cm LEFT ATRIUM           Index       RIGHT ATRIUM          Index LA diam:      3.20 cm 1.89 cm/m  RA Area:     9.76 cm LA Vol (A4C): 55.8 ml 32.98 ml/m RA Volume:   18.40 ml 10.88 ml/m  AORTIC VALVE                    PULMONIC VALVE AV  Area (Vmax):    1.99 cm     PV Vmax:          1.05 m/s AV Area (Vmean):   2.27 cm     PV Vmean:         69.400 cm/s AV Area (VTI):     2.21 cm     PV VTI:           0.172 m AV Vmax:           184.00 cm/s  PV Peak grad:     4.4 mmHg AV Vmean:          120.000 cm/s PV Mean grad:     2.0 mmHg AV VTI:            0.360 m      PR End Diast Vel: 4.49 msec AV Peak Grad:      13.5 mmHg AV Mean Grad:      7.0 mmHg LVOT Vmax:         144.00 cm/s LVOT Vmean:        107.000 cm/s LVOT VTI:          0.312 m LVOT/AV VTI ratio: 0.87  AORTA Ao Root diam: 2.80 cm MITRAL VALVE MV Area (PHT): 3.11 cm     SHUNTS MV Area VTI:   2.30 cm     Systemic VTI:  0.31 m MV Peak grad:  10.1 mmHg    Systemic Diam: 1.80 cm MV Mean grad:  4.0 mmHg MV Vmax:       1.59 m/s MV Vmean:      90.1 cm/s MV Decel Time: 244 msec MV E velocity: 91.30 cm/s MV A velocity: 145.00 cm/s MV E/A ratio:  0.63 Dwayne D Callwood MD Electronically signed by Yolonda Kida MD Signature Date/Time: 12/11/2020/2:20:11 PM    Final    DG HIP UNILAT WITH PELVIS 1V LEFT  Result Date: 12/15/2020 CLINICAL DATA:  Pain after fall EXAM: DG HIP (WITH OR WITHOUT PELVIS) 1V*L* COMPARISON:  None. FINDINGS: Vascular calcifications.  No fracture or dislocation. IMPRESSION: Negative. Electronically Signed   By: Dorise Bullion III M.D.   On: 12/15/2020 20:27   US Abdomen Limited RUQ (LIVER/GB)  Result Date: 12/18/2020 CLINICAL DATA:  Abnormal LFTs. EXAM: ULTRASOUND ABDOMEN LIMITED RIGHT UPPER QUADRANT COMPARISON:  None. FINDINGS: Gallbladder: No gallstones or wall thickening visualized. No sonographic Murphy sign noted by sonographer. Common bile duct: Diameter: 2 mm Liver: No focal lesion identified. Within normal limits in parenchymal echogenicity. Portal vein is patent on color Doppler imaging with normal direction of blood flow towards the liver. Other: None.  IMPRESSION: Unremarkable study. No findings to explain the history of abnormal LFTs. Electronically Signed   By:  Misty Stanley M.D.   On: 12/18/2020 10:21      Subjective: No complaints. Denies cp, sob, dizziness  Discharge Exam: Vitals:   12/20/20 0806 12/20/20 1220  BP: (!) 165/52 (!) 135/56  Pulse: 71 76  Resp: 16 15  Temp: 98 F (36.7 C) 98.2 F (36.8 C)  SpO2: 97% 98%   Vitals:   12/20/20 0455 12/20/20 0559 12/20/20 0806 12/20/20 1220  BP: (!) 172/69 (!) 161/46 (!) 165/52 (!) 135/56  Pulse: 77  71 76  Resp: _0 Temp: 97.9 F (36.6 C)  98 F (36.7 C) 98.2 F (36.8 C)  TempSrc: Oral  Oral   SpO2: 99%  97% 98%  Weight: 56.9 kg     Height:        General: Pt is alert, awake, not in acute distress Cardiovascular: RRR, S1/S2 +, no rubs, no gallops Respiratory: CTA bilaterally, no wheezing, no rhonchi Abdominal: Soft, NT, ND, bowel sounds + Extremities: no edema,rt hand redness and heath /swelling improved    The results of significant diagnostics from this hospitalization (including imaging, microbiology, ancillary and laboratory) are listed below for reference.     Microbiology: Recent Results (from the past 240 hour(s))  Resp Panel by RT-PCR (Flu A&B, Covid) Nasopharyngeal Swab     Status: None   Collection Time: 12/10/20  9:46 PM   Specimen: Nasopharyngeal Swab; Nasopharyngeal(NP) swabs in vial transport medium  Result Value Ref Range Status   SARS Coronavirus 2 by RT PCR NEGATIVE NEGATIVE Final    Comment: (NOTE) SARS-CoV-2 target nucleic acids are NOT DETECTED.  The SARS-CoV-2 RNA is generally detectable in upper respiratory specimens during the acute phase of infection. The lowest concentration of SARS-CoV-2 viral copies this assay can detect is 138 copies/mL. A negative result does not preclude SARS-Cov-2 infection and should not be used as the sole basis for treatment or other patient management decisions. A negative result may occur with  improper specimen collection/handling, submission of specimen other than nasopharyngeal swab, presence of viral  mutation(s) within the areas targeted by this assay, and inadequate number of viral copies(<138 copies/mL). A negative result must be combined with clinical observations, patient history, and epidemiological information. The expected result is Negative.  Fact Sheet for Patients:  EntrepreneurPulse.com.au  Fact Sheet for Healthcare Providers:  IncredibleEmployment.be  This test is no t yet approved or cleared by the [place] FDA and  has been authorized for detection and/or diagnosis of SARS-CoV-2 by FDA under an Emergency Use Authorization (EUA). This EUA will remain  in effect (meaning this test can be used) for the duration of the COVID-19 declaration under Section 564(b)(1) of the Act, 21 U.S.C.section 360bbb-3(b)(1), unless the authorization is terminated  or revoked sooner.       Influenza A by PCR NEGATIVE NEGATIVE Final   Influenza B by PCR NEGATIVE NEGATIVE Final    Comment: (NOTE) The Xpert Xpress SARS-CoV-2/FLU/RSV plus assay is intended as an aid in the diagnosis of influenza from Nasopharyngeal swab specimens and should not be used as a sole basis for treatment. Nasal washings and aspirates are unacceptable for Xpert Xpress SARS-CoV-2/FLU/RSV testing.  Fact Sheet for Patients: EntrepreneurPulse.com.au  Fact Sheet for Healthcare Providers: IncredibleEmployment.be  This test is not yet approved or cleared by the [place] FDA and has been authorized for detection and/or diagnosis of SARS-CoV-2 by FDA under an  Emergency Use Authorization (EUA). This EUA will remain in effect (meaning this test can be used) for the duration of the COVID-19 declaration under Section 564(b)(1) of the Act, 21 U.S.C. section 360bbb-3(b)(1), unless the authorization is terminated or revoked.  Performed at [facility], [address] [place], [place] [address]   Urine Culture     Status: None    Collection Time: 12/14/20  3:15 PM   Specimen: Urine, Random  Result Value Ref Range Status   Specimen Description   Final    URINE, RANDOM Performed at [facility], [address] [place], [place] [address]    Special Requests   Final    NONE Performed at [facility], [address], [place], [place] [address]    Culture   Final    NO GROWTH Performed at [facility], [place] [address], [place], [place] [address]    Report Status 12/15/2020 FINAL  Final  CULTURE, BLOOD (ROUTINE X 2) w Reflex to ID Panel     Status: None (Preliminary result)   Collection Time: 12/16/20  8:17 AM   Specimen: BLOOD  Result Value Ref Range Status   Specimen Description BLOOD RIGHT [facility]  Final   Special Requests   Final    BOTTLES DRAWN AEROBIC AND ANAEROBIC Blood Culture adequate volume   Culture   Final    NO GROWTH 4 DAYS Performed at [facility], [address] [place], [place] [address]    Report Status PENDING  Incomplete  CULTURE, BLOOD (ROUTINE X 2) w Reflex to ID Panel     Status: None (Preliminary result)   Collection Time: 12/16/20  8:27 AM   Specimen: BLOOD  Result Value Ref Range Status   Specimen Description BLOOD RIGHT WRIST  Final   Special Requests   Final    BOTTLES DRAWN AEROBIC AND ANAEROBIC Blood Culture results may not be optimal due to an inadequate volume of blood received in culture bottles   Culture   Final    NO GROWTH 4 DAYS Performed at [facility], [address], [place], [place] [address]    Report Status PENDING  Incomplete  Resp Panel by RT-PCR (Flu A&B, Covid) Nasopharyngeal Swab     Status: None   Collection Time: 12/16/20  9:44 AM   Specimen: Nasopharyngeal Swab; Nasopharyngeal(NP) swabs in vial transport medium  Result Value Ref Range Status   SARS Coronavirus 2 by RT PCR NEGATIVE NEGATIVE Final    Comment: (NOTE) SARS-CoV-2 target nucleic acids are NOT DETECTED.  The SARS-CoV-2 RNA is generally  detectable in upper respiratory specimens during the acute phase of infection. The lowest concentration of SARS-CoV-2 viral copies this assay can detect is 138 copies/mL. A negative result does not preclude SARS-Cov-2 infection and should not be used as the sole basis for treatment or other patient management decisions. A negative result may occur with  improper specimen collection/handling, submission of specimen other than nasopharyngeal swab, presence of viral mutation(s) within the areas targeted by this assay, and inadequate number of viral copies(<138 copies/mL). A negative result must be combined with clinical observations, patient history, and epidemiological information. The expected result is Negative.  Fact Sheet for Patients:  EntrepreneurPulse.com.au  Fact Sheet for Healthcare Providers:  IncredibleEmployment.be  This test is no t yet approved or cleared by the [place] FDA and  has been authorized for detection and/or diagnosis of SARS-CoV-2 by FDA under an Emergency Use Authorization (EUA). This EUA  will remain  in effect (meaning this test can be used) for the duration of the COVID-19 declaration under Section 564(b)(1) of the Act, 21 U.S.C.section 360bbb-3(b)(1), unless the authorization is terminated  or revoked sooner.       Influenza A by PCR NEGATIVE NEGATIVE Final   Influenza B by PCR NEGATIVE NEGATIVE Final    Comment: (NOTE) The Xpert Xpress SARS-CoV-2/FLU/RSV plus assay is intended as an aid in the diagnosis of influenza from Nasopharyngeal swab specimens and should not be used as a sole basis for treatment. Nasal washings and aspirates are unacceptable for Xpert Xpress SARS-CoV-2/FLU/RSV testing.  Fact Sheet for Patients: EntrepreneurPulse.com.au  Fact Sheet for Healthcare Providers: IncredibleEmployment.be  This test is not yet approved or cleared by the [place] FDA  and has been authorized for detection and/or diagnosis of SARS-CoV-2 by FDA under an Emergency Use Authorization (EUA). This EUA will remain in effect (meaning this test can be used) for the duration of the COVID-19 declaration under Section 564(b)(1) of the Act, 21 U.S.C. section 360bbb-3(b)(1), unless the authorization is terminated or revoked.  Performed at [facility], [address] [place], [place] [address]      Labs: BNP (last 3 results) No results for input(s): BNP in the last 8760 hours. Basic Metabolic Panel: Recent Labs  Lab 12/15/20 0449 12/16/20 0551 12/17/20 0522 12/18/20 0501 12/19/20 1507  NA 132* 132* 134* 136 134*  K 4.4 4.1 4.3 4.8 4.7  CL 97* 98 101 101 99  CO2 _0 GLUCOSE 165* 156* 177* 155* 221*  BUN 35* 31* 43* 46* 42*  CREATININE 1.30* 1.12* 1.28* 1.21* 1.16*  CALCIUM 9.6 10.0 9.9 10.0 10.0   Liver Function Tests: Recent Labs  Lab 12/17/20 0522 12/18/20 0501 12/19/20 1507  AST 85* 139* 100*  ALT 77* 136* 131*  ALKPHOS 163* 223* 243*  BILITOT 0.7 0.5 0.4  PROT 6.8 7.1 7.0  ALBUMIN 2.6* 2.5* 2.3*   No results for input(s): LIPASE, AMYLASE in the last 168 hours. Recent Labs  Lab 12/17/20 0522  AMMONIA <9*   CBC: Recent Labs  Lab 12/14/20 0547 12/15/20 0449 12/16/20 0551 12/17/20 0522 12/18/20 0501  WBC 18.2* 15.8* 14.4* 13.0* 12.8*  HGB 12.8 11.6* 11.7* 11.1* 10.4*  HCT 36.9 32.5* 33.5* 31.8* 30.5*  MCV 96.1 94.8 92.3 93.8 93.0  PLT 324 332 370 362 410*   Cardiac Enzymes: No results for input(s): CKTOTAL, CKMB, CKMBINDEX, TROPONINI in the last 168 hours. BNP: Invalid input(s): POCBNP CBG: Recent Labs  Lab 12/19/20 1112 12/19/20 1613 12/19/20 2116 12/20/20 0754 12/20/20 1220  GLUCAP 180* 223* 215* 179* 234*   D-Dimer No results for input(s): DDIMER in the last 72 hours. Hgb A1c No results for input(s): HGBA1C in the last 72 hours. Lipid Profile No results for input(s): CHOL, HDL, LDLCALC,  TRIG, CHOLHDL, LDLDIRECT in the last 72 hours. Thyroid function studies No results for input(s): TSH, T4TOTAL, T3FREE, THYROIDAB in the last 72 hours.  Invalid input(s): FREET3 Anemia work up No results for input(s): VITAMINB12, FOLATE, FERRITIN, TIBC, IRON, RETICCTPCT in the last 72 hours. Urinalysis    Component Value Date/Time   COLORURINE STRAW (A) 12/10/2020 2151   APPEARANCEUR CLEAR (A) 12/10/2020 2151   APPEARANCEUR CLEAR 08/15/2012 1031   LABSPEC 1.006 12/10/2020 2151   LABSPEC 1.015 08/15/2012 1031   PHURINE 7.0 12/10/2020 2151   GLUCOSEU NEGATIVE 12/10/2020 2151   GLUCOSEU 300 mg/dL 08/15/2012 1031   HGBUR NEGATIVE 12/10/2020 2151  BILIRUBINUR NEGATIVE 12/10/2020 2151   [place] NEGATIVE 08/15/2012 1031   [place] 12/10/2020 2151   PROTEINUR NEGATIVE 12/10/2020 2151   NITRITE NEGATIVE 12/10/2020 2151   LEUKOCYTESUR SMALL (A) 12/10/2020 2151   LEUKOCYTESUR NEGATIVE 08/15/2012 1031   Sepsis Labs Invalid input(s): PROCALCITONIN,  WBC,  LACTICIDVEN Microbiology Recent Results (from the past 240 hour(s))  Resp Panel by RT-PCR (Flu A&B, Covid) Nasopharyngeal Swab     Status: None   Collection Time: 12/10/20  9:46 PM   Specimen: Nasopharyngeal Swab; Nasopharyngeal(NP) swabs in vial transport medium  Result Value Ref Range Status   SARS Coronavirus 2 by RT PCR NEGATIVE NEGATIVE Final    Comment: (NOTE) SARS-CoV-2 target nucleic acids are NOT DETECTED.  The SARS-CoV-2 RNA is generally detectable in upper respiratory specimens during the acute phase of infection. The lowest concentration of SARS-CoV-2 viral copies this assay can detect is 138 copies/mL. A negative result does not preclude SARS-Cov-2 infection and should not be used as the sole basis for treatment or other patient management decisions. A negative result may occur with  improper specimen collection/handling, submission of specimen other than nasopharyngeal swab, presence of viral mutation(s)  within the areas targeted by this assay, and inadequate number of viral copies(<138 copies/mL). A negative result must be combined with clinical observations, patient history, and epidemiological information. The expected result is Negative.  Fact Sheet for Patients:  EntrepreneurPulse.com.au  Fact Sheet for Healthcare Providers:  IncredibleEmployment.be  This test is no t yet approved or cleared by the [place] FDA and  has been authorized for detection and/or diagnosis of SARS-CoV-2 by FDA under an Emergency Use Authorization (EUA). This EUA will remain  in effect (meaning this test can be used) for the duration of the COVID-19 declaration under Section 564(b)(1) of the Act, 21 U.S.C.section 360bbb-3(b)(1), unless the authorization is terminated  or revoked sooner.       Influenza A by PCR NEGATIVE NEGATIVE Final   Influenza B by PCR NEGATIVE NEGATIVE Final    Comment: (NOTE) The Xpert Xpress SARS-CoV-2/FLU/RSV plus assay is intended as an aid in the diagnosis of influenza from Nasopharyngeal swab specimens and should not be used as a sole basis for treatment. Nasal washings and aspirates are unacceptable for Xpert Xpress SARS-CoV-2/FLU/RSV testing.  Fact Sheet for Patients: EntrepreneurPulse.com.au  Fact Sheet for Healthcare Providers: IncredibleEmployment.be  This test is not yet approved or cleared by the [place] FDA and has been authorized for detection and/or diagnosis of SARS-CoV-2 by FDA under an Emergency Use Authorization (EUA). This EUA will remain in effect (meaning this test can be used) for the duration of the COVID-19 declaration under Section 564(b)(1) of the Act, 21 U.S.C. section 360bbb-3(b)(1), unless the authorization is terminated or revoked.  Performed at [facility], [address], [place], [place] [address]   Urine Culture     Status: None   Collection  Time: 12/14/20  3:15 PM   Specimen: Urine, Random  Result Value Ref Range Status   Specimen Description   Final    URINE, RANDOM Performed at [facility], [address], [place], [place] [address]    Special Requests   Final    NONE Performed at [facility], [address], [place], [place] [address]    Culture   Final    NO GROWTH Performed at [facility], [place] [address], [place], [place] [address]    Report Status 12/15/2020 FINAL  Final  CULTURE, BLOOD (ROUTINE  X 2) w Reflex to ID Panel     Status: None (Preliminary result)   Collection Time: 12/16/20  8:17 AM   Specimen: BLOOD  Result Value Ref Range Status   Specimen Description BLOOD RIGHT [facility]  Final   Special Requests   Final    BOTTLES DRAWN AEROBIC AND ANAEROBIC Blood Culture adequate volume   Culture   Final    NO GROWTH 4 DAYS Performed at [facility], [address] [place], [place] [address]    Report Status PENDING  Incomplete  CULTURE, BLOOD (ROUTINE X 2) w Reflex to ID Panel     Status: None (Preliminary result)   Collection Time: 12/16/20  8:27 AM   Specimen: BLOOD  Result Value Ref Range Status   Specimen Description BLOOD RIGHT WRIST  Final   Special Requests   Final    BOTTLES DRAWN AEROBIC AND ANAEROBIC Blood Culture results may not be optimal due to an inadequate volume of blood received in culture bottles   Culture   Final    NO GROWTH 4 DAYS Performed at [facility], [address], [place], [place] [address]    Report Status PENDING  Incomplete  Resp Panel by RT-PCR (Flu A&B, Covid) Nasopharyngeal Swab     Status: None   Collection Time: 12/16/20  9:44 AM   Specimen: Nasopharyngeal Swab; Nasopharyngeal(NP) swabs in vial transport medium  Result Value Ref Range Status   SARS Coronavirus 2 by RT PCR NEGATIVE NEGATIVE Final    Comment: (NOTE) SARS-CoV-2 target nucleic acids are NOT DETECTED.  The SARS-CoV-2 RNA is generally detectable in upper  respiratory specimens during the acute phase of infection. The lowest concentration of SARS-CoV-2 viral copies this assay can detect is 138 copies/mL. A negative result does not preclude SARS-Cov-2 infection and should not be used as the sole basis for treatment or other patient management decisions. A negative result may occur with  improper specimen collection/handling, submission of specimen other than nasopharyngeal swab, presence of viral mutation(s) within the areas targeted by this assay, and inadequate number of viral copies(<138 copies/mL). A negative result must be combined with clinical observations, patient history, and epidemiological information. The expected result is Negative.  Fact Sheet for Patients:  EntrepreneurPulse.com.au  Fact Sheet for Healthcare Providers:  IncredibleEmployment.be  This test is no t yet approved or cleared by the [place] FDA and  has been authorized for detection and/or diagnosis of SARS-CoV-2 by FDA under an Emergency Use Authorization (EUA). This EUA will remain  in effect (meaning this test can be used) for the duration of the COVID-19 declaration under Section 564(b)(1) of the Act, 21 U.S.C.section 360bbb-3(b)(1), unless the authorization is terminated  or revoked sooner.       Influenza A by PCR NEGATIVE NEGATIVE Final   Influenza B by PCR NEGATIVE NEGATIVE Final    Comment: (NOTE) The Xpert Xpress SARS-CoV-2/FLU/RSV plus assay is intended as an aid in the diagnosis of influenza from Nasopharyngeal swab specimens and should not be used as a sole basis for treatment. Nasal washings and aspirates are unacceptable for Xpert Xpress SARS-CoV-2/FLU/RSV testing.  Fact Sheet for Patients: EntrepreneurPulse.com.au  Fact Sheet for Healthcare Providers: IncredibleEmployment.be  This test is not yet approved or cleared by the [place] FDA and has been  authorized for detection and/or diagnosis of SARS-CoV-2 by FDA under an Emergency Use Authorization (EUA). This EUA will remain in effect (meaning this test can be used) for the duration of the COVID-19  declaration under Section 564(b)(1) of the Act, 21 U.S.C. section 360bbb-3(b)(1), unless the authorization is terminated or revoked.  Performed at [facility], [address], [place], [place] [address]      Time coordinating discharge: Over 30 minutes  SIGNED:   Nolberto Hanlon, MD  [facility] 12/20/2020, 2:04 PM Pager   If 7PM-7AM, please contact night-coverage [email or web address] Password [email or web address]

## 2020-12-20 NOTE — Progress Notes (Signed)
Physical Therapy Treatment Patient Details Name: [name] MRN: [id number] DOB: Oct 13, 1930 Today's Date: 12/20/2020    History of Present Illness 87 year old female with past medical history noted for DM, HTN, CAD, CKD 3B with history of stent placement, and prior admission in 2021 for syncope.  Patient presented with syncope on 12/10/2020 and fall.    PT Comments    Pt received in bed, agreed to attempt transfer to bedside chair.  Pt appears to be in less discomfort initially.  Rolling with Mod/MaxA and railing.  Supine to sit with MaxA, increased time for slow transition, and repeated vc's to keep pt on track.  Pt sat EOB for a few minutes requiring assist to maintain balance and was returned to bed after c/o neck pain and fatigue.  Pt positioned to comfort with call bell in reach and bed alarm on.  Pt slowly improving, less pain with movement today.  Continue PT per POC.    Follow Up Recommendations  SNF;Supervision/Assistance - 24 hour     Equipment Recommendations  None recommended by PT    Recommendations for Other Services       Precautions / Restrictions Precautions Precautions: Fall Restrictions Weight Bearing Restrictions: No    Mobility  Bed Mobility Overal bed mobility: Needs Assistance Bed Mobility: Sit to Supine     Supine to sit: Mod assist;HOB elevated Sit to supine: Max assist   General bed mobility comments: Pt attempted to help, but too fatigued, loss focus, or c/o discomfort    Transfers                 General transfer comment: Unable to attempt standing after sitting EOB  Ambulation/Gait                 Stairs             Wheelchair Mobility    Modified Rankin (Stroke Patients Only)       Balance   Sitting-balance support: Feet supported;Bilateral upper extremity supported Sitting balance-Leahy Scale: Poor Sitting balance - Comments:  (Required ModA to maintain upright sitting) Postural control: Posterior  lean;Left lateral lean Standing balance support: Bilateral upper extremity supported;During functional activity                                Cognition Arousal/Alertness: Awake/alert Behavior During Therapy:  (fatigued) Overall Cognitive Status: Difficult to assess                                 General Comments: Pt needs to be redirected often, asks same questions, unaware of where she is or why      Exercises      General Comments General comments (skin integrity, edema, etc.):  (c/o itchy back, lotion applied and wrinkles flattened out on bed)      Pertinent Vitals/Pain Pain Assessment: Faces Faces Pain Scale: Hurts little more Pain Location:  (neck) Pain Descriptors / Indicators: Aching Pain Intervention(s): Monitored during session;Repositioned    Home Living                      Prior Function            PT Goals (current goals can now be found in the care plan section) Acute Rehab PT Goals Patient Stated Goal:  (To feel better)    Frequency  Min 2X/week      PT Plan Current plan remains appropriate    Co-evaluation              AM-PAC PT "6 Clicks" Mobility   Outcome Measure  Help needed turning from your back to your side while in a flat bed without using bedrails?: Total Help needed moving from lying on your back to sitting on the side of a flat bed without using bedrails?: Total Help needed moving to and from a bed to a chair (including a wheelchair)?: Total Help needed standing up from a chair using your arms (e.g., wheelchair or bedside chair)?: Total Help needed to walk in hospital room?: Total Help needed climbing 3-5 steps with a railing? : Total 6 Click Score: 6    End of Session   Activity Tolerance: Patient limited by fatigue Patient left: in bed;with call bell/phone within reach;with bed alarm set Nurse Communication: Mobility status PT Visit Diagnosis: Unsteadiness on feet  (R26.81);Muscle weakness (generalized) (M62.81);Difficulty in walking, not elsewhere classified (R26.2)     Time: 2694-8546 PT Time Calculation (min) (ACUTE ONLY): 24 min  Charges:  $Therapeutic Activity: 23-37 mins                    Zadie Cleverly, PTA    Jannet Askew 12/20/2020, 12:09 PM

## 2020-12-20 NOTE — Progress Notes (Signed)
Inpatient Diabetes Program Recommendations  AACE/ADA: New Consensus Statement on Inpatient Glycemic Control (2015)  Target Ranges:  Prepandial:   less than 140 mg/dL      Peak postprandial:   less than 180 mg/dL (1-2 hours)      Critically ill patients:  140 - 180 mg/dL   Results for CHANTRELL, APSEY (MRN [id number]) as of 12/20/2020 08:56  Ref. Range 12/19/2020 08:02 12/19/2020 11:12 12/19/2020 16:13 12/19/2020 21:16  Glucose-Capillary Latest Ref Range: 70 - 99 mg/dL 503 (H) 546 (H) 568 (H) 215 (H)  Results for DEYSY, SCHABEL (MRN [id number]) as of 12/20/2020 12:56  Ref. Range 12/20/2020 07:54 12/20/2020 12:20  Glucose-Capillary Latest Ref Range: 70 - 99 mg/dL 749 (H)  3 units Novolog   234 (H)  5 units Novolog      Home DM Meds: Amaryl 1 mg Daily  Current Orders: Novolog 0-15 units TID AC + HS    MD- Note Prednisone 60 mg Daily added this AM.  Afternoon CBGs elevated  Please consider starting Novolog Meal Coverage: Novolog 3 units TID with meals  Hold if pt eats <50% of meal, Hold if pt NPO   --Will follow patient during hospitalization--  Ambrose Finland RN, MSN, CDE Diabetes Coordinator Inpatient Glycemic Control Team Team Pager: [phone] (8a-5p)

## 2020-12-21 LAB — CULTURE, BLOOD (ROUTINE X 2)
Culture: NO GROWTH
Culture: NO GROWTH
Special Requests: ADEQUATE

## 2020-12-21 LAB — RHEUMATOID FACTOR: Rheumatoid fact SerPl-aCnc: 18.3 IU/mL — ABNORMAL HIGH (ref <14.0)

## 2020-12-25 LAB — CYCLIC CITRUL PEPTIDE ANTIBODY, IGG/IGA: CCP Antibodies IgG/IgA: 167 units — ABNORMAL HIGH (ref 0–19)

## 2021-01-27 ENCOUNTER — Inpatient Hospital Stay: Payer: No Typology Code available for payment source

## 2021-01-27 ENCOUNTER — Encounter: Payer: Self-pay | Admitting: Oncology

## 2021-01-27 ENCOUNTER — Inpatient Hospital Stay: Payer: No Typology Code available for payment source | Attending: Oncology | Admitting: Oncology

## 2021-01-27 ENCOUNTER — Other Ambulatory Visit: Payer: Self-pay

## 2021-01-27 VITALS — BP 130/52 | HR 73 | Temp 98.1°F | Resp 17 | Wt 129.5 lb

## 2021-01-27 DIAGNOSIS — Z7982 Long term (current) use of aspirin: Secondary | ICD-10-CM | POA: Insufficient documentation

## 2021-01-27 DIAGNOSIS — D649 Anemia, unspecified: Secondary | ICD-10-CM | POA: Insufficient documentation

## 2021-01-27 DIAGNOSIS — D72829 Elevated white blood cell count, unspecified: Secondary | ICD-10-CM

## 2021-01-27 DIAGNOSIS — Z803 Family history of malignant neoplasm of breast: Secondary | ICD-10-CM | POA: Diagnosis not present

## 2021-01-27 DIAGNOSIS — R55 Syncope and collapse: Secondary | ICD-10-CM | POA: Insufficient documentation

## 2021-01-27 DIAGNOSIS — I10 Essential (primary) hypertension: Secondary | ICD-10-CM | POA: Insufficient documentation

## 2021-01-27 DIAGNOSIS — E7800 Pure hypercholesterolemia, unspecified: Secondary | ICD-10-CM | POA: Insufficient documentation

## 2021-01-27 DIAGNOSIS — R6889 Other general symptoms and signs: Secondary | ICD-10-CM

## 2021-01-27 DIAGNOSIS — I252 Old myocardial infarction: Secondary | ICD-10-CM | POA: Insufficient documentation

## 2021-01-27 DIAGNOSIS — M79641 Pain in right hand: Secondary | ICD-10-CM | POA: Diagnosis not present

## 2021-01-27 DIAGNOSIS — D75839 Thrombocytosis, unspecified: Secondary | ICD-10-CM | POA: Insufficient documentation

## 2021-01-27 LAB — COMPREHENSIVE METABOLIC PANEL
ALT: 9 U/L (ref 0–44)
AST: 10 U/L — ABNORMAL LOW (ref 15–41)
Albumin: 3.2 g/dL — ABNORMAL LOW (ref 3.5–5.0)
Alkaline Phosphatase: 88 U/L (ref 38–126)
Anion gap: 8 (ref 5–15)
BUN: 26 mg/dL — ABNORMAL HIGH (ref 8–23)
CO2: 25 mmol/L (ref 22–32)
Calcium: 9.4 mg/dL (ref 8.9–10.3)
Chloride: 101 mmol/L (ref 98–111)
Creatinine, Ser: 0.97 mg/dL (ref 0.44–1.00)
GFR, Estimated: 56 mL/min — ABNORMAL LOW (ref >60)
Glucose, Bld: 135 mg/dL — ABNORMAL HIGH (ref 70–99)
Potassium: 4 mmol/L (ref 3.5–5.1)
Sodium: 134 mmol/L — ABNORMAL LOW (ref 135–145)
Total Bilirubin: <0.1 mg/dL — ABNORMAL LOW (ref 0.3–1.2)
Total Protein: 6.9 g/dL (ref 6.5–8.1)

## 2021-01-27 LAB — CBC WITH DIFFERENTIAL/PLATELET
Abs Immature Granulocytes: 0.03 10*3/uL (ref 0.00–0.07)
Basophils Absolute: 0.1 10*3/uL (ref 0.0–0.1)
Basophils Relative: 1 %
Eosinophils Absolute: 0.4 10*3/uL (ref 0.0–0.5)
Eosinophils Relative: 4 %
HCT: 30.4 % — ABNORMAL LOW (ref 36.0–46.0)
Hemoglobin: 10.1 g/dL — ABNORMAL LOW (ref 12.0–15.0)
Immature Granulocytes: 0 %
Lymphocytes Relative: 15 %
Lymphs Abs: 1.5 10*3/uL (ref 0.7–4.0)
MCH: 31.2 pg (ref 26.0–34.0)
MCHC: 33.2 g/dL (ref 30.0–36.0)
MCV: 93.8 fL (ref 80.0–100.0)
Monocytes Absolute: 0.9 10*3/uL (ref 0.1–1.0)
Monocytes Relative: 9 %
Neutro Abs: 7 10*3/uL (ref 1.7–7.7)
Neutrophils Relative %: 71 %
Platelets: 496 10*3/uL — ABNORMAL HIGH (ref 150–400)
RBC: 3.24 MIL/uL — ABNORMAL LOW (ref 3.87–5.11)
RDW: 14.4 % (ref 11.5–15.5)
WBC: 9.8 10*3/uL (ref 4.0–10.5)
nRBC: 0 % (ref 0.0–0.2)

## 2021-01-27 LAB — TECHNOLOGIST SMEAR REVIEW
Plt Morphology: NORMAL
RBC MORPHOLOGY: NORMAL
WBC MORPHOLOGY: NORMAL

## 2021-01-27 LAB — IRON AND TIBC
Iron: 21 ug/dL — ABNORMAL LOW (ref 28–170)
Saturation Ratios: 11 % (ref 10.4–31.8)
TIBC: 200 ug/dL — ABNORMAL LOW (ref 250–450)
UIBC: 179 ug/dL

## 2021-01-27 LAB — LACTATE DEHYDROGENASE: LDH: 115 U/L (ref 98–192)

## 2021-01-27 LAB — RETIC PANEL
Immature Retic Fract: 15.8 % (ref 2.3–15.9)
RBC.: 3.24 MIL/uL — ABNORMAL LOW (ref 3.87–5.11)
Retic Count, Absolute: 46 10*3/uL (ref 19.0–186.0)
Retic Ct Pct: 1.4 % (ref 0.4–3.1)
Reticulocyte Hemoglobin: 31.2 pg (ref >27.9)

## 2021-01-27 LAB — VITAMIN B12: Vitamin B-12: 682 pg/mL (ref 180–914)

## 2021-01-27 LAB — FOLATE: Folate: 18.3 ng/mL (ref >5.9)

## 2021-01-27 LAB — FERRITIN: Ferritin: 159 ng/mL (ref 11–307)

## 2021-01-27 NOTE — Progress Notes (Signed)
Hematology/Oncology Consult note [facility] [phone] [phone]   Patient Care Team: Rusty Aus, MD as PCP - General (Unknown Physician Specialty)  REFERRING PROVIDER: Cephas Darby, FNP  CHIEF COMPLAINTS/REASON FOR VISIT:  Evaluation of leukocytosis  HISTORY OF PRESENTING ILLNESS:   [name] is a  87 y.o.  female with PMH listed below was seen in consultation at the request of  Cephas Darby, FNP  for evaluation of leukocytosis  12/10/2020-12/20/2020 patient was admitted due to syncope episode of unknown etiology.  It was felt that this event may be related to multiple antihypertension medication. Upon admission, patient was noted to have WBC elevated at 10.8. Patient had a normal total white count in 2021. Patient had negative infectious work-up including negative chest x-ray, urine culture negative, blood culture negative.  COVID/influenza were both negative.  Patient complained about right hand pain.  Rheumatology was consulted and patient was recommended to start on prednisone for pseudogout.  Patient was discharged to nursing home.  She is accompanied by her son today.  Her family, patient is about to be discharged home.  She is referred to establish care with hematology oncology for evaluation of leukocytosis.  Currently from the nursing home record, she is not on prednisone.  Patient reports that she has no memory about her hospitalization.  Appetite is fair.  No significant unintentional weight loss, night sweating, fever. She denies any chronic wound/infection.   Review of Systems  Constitutional:  Positive for fatigue. Negative for appetite change, chills, fever and unexpected weight change.  HENT:   Negative for hearing loss and voice change.   Eyes:  Negative for eye problems.  Respiratory:  Negative for chest tightness and cough.   Cardiovascular:  Negative for chest pain.  Gastrointestinal:  Negative for  abdominal distention, abdominal pain and blood in stool.  Endocrine: Negative for hot flashes.  Genitourinary:  Negative for difficulty urinating and frequency.   Musculoskeletal:  Negative for arthralgias.  Skin:  Negative for itching and rash.  Neurological:  Negative for extremity weakness.       Recent syncope event.  Hematological:  Negative for adenopathy.  Psychiatric/Behavioral:  Negative for confusion.    MEDICAL HISTORY:  Past Medical History:  Diagnosis Date   Hypercholesteremia    Hypertension    Myocardial infarction ([place]) 2008   S/P angioplasty with stent     SURGICAL HISTORY: Past Surgical History:  Procedure Laterality Date   ABDOMINAL HYSTERECTOMY     APPENDECTOMY     CORONARY ANGIOPLASTY WITH STENT PLACEMENT     CORONARY STENT INTERVENTION N/A 12/12/2019   Procedure: CORONARY STENT INTERVENTION;  Surgeon: Isaias Cowman, MD;  Location: [facility];  Service: Cardiovascular;  Laterality: N/A;   LEFT HEART CATH AND CORONARY ANGIOGRAPHY N/A 12/12/2019   Procedure: LEFT HEART CATH AND CORONARY ANGIOGRAPHY with possible PCI;  Surgeon: Isaias Cowman, MD;  Location: [facility];  Service: Cardiovascular;  Laterality: N/A;    SOCIAL HISTORY: Social History   Socioeconomic History   Marital status: Widowed    Spouse name: Not on file   Number of children: Not on file   Years of education: Not on file   Highest education level: Not on file  Occupational History   Not on file  Tobacco Use   Smoking status: Never   Smokeless tobacco: Never  Vaping Use   Vaping Use: Never used  Substance and Sexual Activity   Alcohol use: No  Drug use: No   Sexual activity: Not on file  Other Topics Concern   Not on file  Social History Narrative   Not on file   Social Determinants of Health   Financial Resource Strain: Not on file  Food Insecurity: Not on file  Transportation Needs: Not on file  Physical Activity: Not on file  Stress:  Not on file  Social Connections: Not on file  Intimate Partner Violence: Not on file    FAMILY HISTORY: Family History  Problem Relation Age of Onset   Cancer Mother    Heart disease Father    Breast cancer Maternal Aunt     ALLERGIES:  is allergic to butenafine, levofloxacin, limonene, penicillins, and terbinafine and related.  MEDICATIONS:  Current Outpatient Medications  Medication Sig Dispense Refill   amLODipine (NORVASC) 10 MG tablet Take 1 tablet (10 mg total) by mouth daily.     aspirin EC 81 MG EC tablet Take 1 tablet (81 mg total) by mouth daily. Swallow whole. 30 tablet 11   clopidogrel (PLAVIX) 75 MG tablet Take 75 mg by mouth daily.     docusate sodium (COLACE) 100 MG capsule Take 2 capsules (200 mg total) by mouth 2 (two) times daily. 10 capsule 0   donepezil (ARICEPT) 10 MG tablet Take 10 mg by mouth daily.     fluticasone (FLONASE) 50 MCG/ACT nasal spray Place 1 spray into both nostrils daily.  2   isosorbide mononitrate (IMDUR) 60 MG 24 hr tablet Take 1 tablet (60 mg total) by mouth daily.     magnesium oxide (MAG-OX) 400 MG tablet Take 400 mg by mouth daily.      omeprazole (PRILOSEC) 20 MG capsule Take 20 mg by mouth daily.      polyethylene glycol (MIRALAX / GLYCOLAX) 17 g packet Take 17 g by mouth daily. 14 each 0   timolol (TIMOPTIC) 0.5 % ophthalmic solution Place 1 drop into both eyes at bedtime.     tizanidine (ZANAFLEX) 2 MG capsule Take 2 mg by mouth 2 (two) times daily as needed for muscle spasms.     nitroGLYCERIN (NITROSTAT) 0.4 MG SL tablet Place 0.4 mg under the tongue every 5 (five) minutes as needed. For chest pain     vitamin B-12 (CYANOCOBALAMIN) 100 MCG tablet Take 100 mcg by mouth daily.     No current facility-administered medications for this visit.     PHYSICAL EXAMINATION: ECOG PERFORMANCE STATUS: 2 - Symptomatic, <50% confined to bed Vitals:   01/27/21 1117  BP: (!) 130/52  Pulse: 73  Resp: 17  Temp: 98.1 F (36.7 C)  SpO2: 100%    Filed Weights   01/27/21 1117  Weight: 129 lb 8 oz (58.7 kg)    Physical Exam Constitutional:      General: She is not in acute distress.    Comments: Patient sit in a wheelchair  HENT:     Head: Normocephalic and atraumatic.  Eyes:     General: No scleral icterus. Cardiovascular:     Rate and Rhythm: Normal rate and regular rhythm.     Heart sounds: Normal heart sounds.  Pulmonary:     Effort: Pulmonary effort is normal. No respiratory distress.     Breath sounds: No wheezing.  Abdominal:     General: Bowel sounds are normal. There is no distension.     Palpations: Abdomen is soft.  Musculoskeletal:        General: No deformity. Normal range of motion.  Cervical back: Normal range of motion and neck supple.  Skin:    General: Skin is warm and dry.     Findings: No erythema or rash.  Neurological:     Mental Status: She is alert and oriented to person, place, and time. Mental status is at baseline.     Cranial Nerves: No cranial nerve deficit.     Coordination: Coordination normal.  Psychiatric:        Mood and Affect: Mood normal.    LABORATORY DATA:  I have reviewed the data as listed Lab Results  Component Value Date   WBC 9.8 01/27/2021   HGB 10.1 (L) 01/27/2021   HCT 30.4 (L) 01/27/2021   MCV 93.8 01/27/2021   PLT 496 (H) 01/27/2021   Recent Labs    12/18/20 0501 12/19/20 1507 01/27/21 1139  NA 136 134* 134*  K 4.8 4.7 4.0  CL 101 99 101  CO2 _0 GLUCOSE 155* 221* 135*  BUN 46* 42* 26*  CREATININE 1.21* 1.16* 0.97  CALCIUM 10.0 10.0 9.4  GFRNONAA 43* 45* 56*  PROT 7.1 7.0 6.9  ALBUMIN 2.5* 2.3* 3.2*  AST 139* 100* 10*  ALT 136* 131* 9  ALKPHOS 223* 243* 88  BILITOT 0.5 0.4 <0.1*   Iron/TIBC/Ferritin/ %Sat    Component Value Date/Time   IRON 21 (L) 01/27/2021 1139   TIBC 200 (L) 01/27/2021 1139   FERRITIN 159 01/27/2021 1139   IRONPCTSAT 11 01/27/2021 1139      RADIOGRAPHIC STUDIES: I have personally reviewed the  radiological images as listed and agreed with the findings in the report. No results found.    ASSESSMENT & PLAN:  1. Normocytic anemia   2. Leukocytosis, unspecified type   3. Thrombocytosis   4. Other general symptoms and signs     #Leukocytosis, patient has a history of intermittent leukocytosis.  This could be reactive versus due to underlying malignant process Recommend to check CBC, smear, flow cytometry, multiple myeloma panel, Light chain ratio, LDH  #Thrombocytosis, questionable reactive.Pending above work-up.  May consider future myeloproliferative disease work up if needed.  #Normocytic anemia, check iron TIBC ferritin, B12 and folate.    Orders Placed This Encounter  Procedures   Iron and TIBC    Standing Status:   Future    Number of Occurrences:   1    Standing Expiration Date:   01/27/2022   Ferritin    Standing Status:   Future    Number of Occurrences:   1    Standing Expiration Date:   07/28/2021   Technologist smear review    Standing Status:   Future    Number of Occurrences:   1    Standing Expiration Date:   01/27/2022   Comprehensive metabolic panel    Standing Status:   Future    Number of Occurrences:   1    Standing Expiration Date:   01/27/2022   CBC with Differential/Platelet    Standing Status:   Future    Number of Occurrences:   1    Standing Expiration Date:   01/27/2022   Retic Panel    Standing Status:   Future    Number of Occurrences:   1    Standing Expiration Date:   01/27/2022   Multiple Myeloma Panel (SPEP&IFE w/QIG)    Standing Status:   Future    Number of Occurrences:   1    Standing Expiration Date:   01/27/2022  Kappa/lambda light chains    Standing Status:   Future    Number of Occurrences:   1    Standing Expiration Date:   01/27/2022   Flow cytometry panel-leukemia/lymphoma work-up    Standing Status:   Future    Number of Occurrences:   1    Standing Expiration Date:   01/27/2022   Lactate dehydrogenase     Standing Status:   Future    Number of Occurrences:   1    Standing Expiration Date:   01/27/2022   Vitamin B12    Standing Status:   Future    Number of Occurrences:   1    Standing Expiration Date:   01/27/2022   Folate    Standing Status:   Future    Number of Occurrences:   1    Standing Expiration Date:   01/27/2022    All questions were answered. The patient knows to call the clinic with any problems questions or concerns.   Cephas Darby, FNP    Return of visit: 2-3 weeks.  Thank you for this kind referral and the opportunity to participate in the care of this patient. A copy of today's note is routed to referring provider    Earlie Server, MD, PhD Hematology Oncology [place] [facility]  01/27/2021

## 2021-01-27 NOTE — Progress Notes (Signed)
Pt had a fall at home hitting her head. She became "unconscious". Taken to hospital and then transferred to SNF [facility]. She has no memory of hospital or arrival to SNF. 1-2 later she became "concious" and cognizant. Wants to know what happened to her. Explained to her this MD visit is for her abnormal blood counts. She is still weak, in a wheelchair. Prior to fall she was up and active. Reports appetite is good. Son is with her today.

## 2021-01-28 LAB — KAPPA/LAMBDA LIGHT CHAINS
Kappa free light chain: 29 mg/L — ABNORMAL HIGH (ref 3.3–19.4)
Kappa, lambda light chain ratio: 1.41 (ref 0.26–1.65)
Lambda free light chains: 20.6 mg/L (ref 5.7–26.3)

## 2021-01-29 LAB — COMP PANEL: LEUKEMIA/LYMPHOMA

## 2021-01-30 LAB — MULTIPLE MYELOMA PANEL, SERUM
Albumin SerPl Elph-Mcnc: 2.7 g/dL — ABNORMAL LOW (ref 2.9–4.4)
Albumin/Glob SerPl: 0.9 (ref 0.7–1.7)
Alpha 1: 0.4 g/dL (ref 0.0–0.4)
Alpha2 Glob SerPl Elph-Mcnc: 1.2 g/dL — ABNORMAL HIGH (ref 0.4–1.0)
B-Globulin SerPl Elph-Mcnc: 1.1 g/dL (ref 0.7–1.3)
Gamma Glob SerPl Elph-Mcnc: 0.5 g/dL (ref 0.4–1.8)
Globulin, Total: 3.2 g/dL (ref 2.2–3.9)
IgA: 193 mg/dL (ref 64–422)
IgG (Immunoglobin G), Serum: 652 mg/dL (ref 586–1602)
IgM (Immunoglobulin M), Srm: 38 mg/dL (ref 26–217)
Total Protein ELP: 5.9 g/dL — ABNORMAL LOW (ref 6.0–8.5)

## 2021-02-03 ENCOUNTER — Ambulatory Visit (INDEPENDENT_AMBULATORY_CARE_PROVIDER_SITE_OTHER): Payer: Medicare Other | Admitting: Vascular Surgery

## 2021-02-03 ENCOUNTER — Encounter (INDEPENDENT_AMBULATORY_CARE_PROVIDER_SITE_OTHER): Payer: Medicare Other

## 2021-02-13 ENCOUNTER — Ambulatory Visit: Payer: Medicare Other | Admitting: Oncology

## 2021-02-17 ENCOUNTER — Encounter: Payer: Self-pay | Admitting: Oncology

## 2021-02-17 ENCOUNTER — Inpatient Hospital Stay (HOSPITAL_BASED_OUTPATIENT_CLINIC_OR_DEPARTMENT_OTHER): Payer: No Typology Code available for payment source | Admitting: Oncology

## 2021-02-17 ENCOUNTER — Other Ambulatory Visit: Payer: Self-pay

## 2021-02-17 VITALS — BP 168/46 | HR 58 | Temp 97.3°F | Resp 18 | Wt 127.6 lb

## 2021-02-17 DIAGNOSIS — D75839 Thrombocytosis, unspecified: Secondary | ICD-10-CM

## 2021-02-17 DIAGNOSIS — D649 Anemia, unspecified: Secondary | ICD-10-CM | POA: Diagnosis not present

## 2021-02-17 DIAGNOSIS — D72829 Elevated white blood cell count, unspecified: Secondary | ICD-10-CM | POA: Diagnosis not present

## 2021-02-17 NOTE — Progress Notes (Signed)
Hematology/Oncology progress note [facility] [phone] [phone]   Patient Care Team: Rusty Aus, MD as PCP - General (Unknown Physician Specialty)  REFERRING PROVIDER: Rusty Aus, MD  CHIEF COMPLAINTS/REASON FOR VISIT:  Follow up for leukocytosis  HISTORY OF PRESENTING ILLNESS:   [name] is a  87 y.o.  female with PMH listed below was seen in consultation at the request of  Rusty Aus, MD  for evaluation of leukocytosis  12/10/2020-12/20/2020 patient was admitted due to syncope episode of unknown etiology.  It was felt that this event may be related to multiple antihypertension medication. Upon admission, patient was noted to have WBC elevated at 10.8. Patient had a normal total white count in 2021. Patient had negative infectious work-up including negative chest x-ray, urine culture negative, blood culture negative.  COVID/influenza were both negative.  Patient complained about right hand pain.  Rheumatology was consulted and patient was recommended to start on prednisone for pseudogout.  Patient was discharged to nursing home.  She is accompanied by her son today.  Her family, patient is about to be discharged home.  She is referred to establish care with hematology oncology for evaluation of leukocytosis.  Currently from the nursing home record, she is not on prednisone.  Patient reports that she has no memory about her hospitalization.  Appetite is fair.  No significant unintentional weight loss, night sweating, fever. She denies any chronic wound/infection.  INTERVAL HISTORY [name] is a 87 y.o. female who has above history reviewed by me today presents for follow up visit for leukocytosis and thrombocytosis She has blood work done and presents to discuss results. Accompanied by her son.    Review of Systems  Constitutional:  Positive for fatigue. Negative for appetite change, chills, fever and unexpected weight  change.  HENT:   Negative for hearing loss and voice change.   Eyes:  Negative for eye problems.  Respiratory:  Negative for chest tightness and cough.   Cardiovascular:  Negative for chest pain.  Gastrointestinal:  Negative for abdominal distention, abdominal pain and blood in stool.  Endocrine: Negative for hot flashes.  Genitourinary:  Negative for difficulty urinating and frequency.   Musculoskeletal:  Negative for arthralgias.  Skin:  Negative for itching and rash.  Neurological:  Negative for extremity weakness.       Recent syncope event.  Hematological:  Negative for adenopathy.  Psychiatric/Behavioral:  Negative for confusion.    MEDICAL HISTORY:  Past Medical History:  Diagnosis Date   Hypercholesteremia    Hypertension    Myocardial infarction ([place]) 2008   S/P angioplasty with stent     SURGICAL HISTORY: Past Surgical History:  Procedure Laterality Date   ABDOMINAL HYSTERECTOMY     APPENDECTOMY     CORONARY ANGIOPLASTY WITH STENT PLACEMENT     CORONARY STENT INTERVENTION N/A 12/12/2019   Procedure: CORONARY STENT INTERVENTION;  Surgeon: Isaias Cowman, MD;  Location: [facility];  Service: Cardiovascular;  Laterality: N/A;   LEFT HEART CATH AND CORONARY ANGIOGRAPHY N/A 12/12/2019   Procedure: LEFT HEART CATH AND CORONARY ANGIOGRAPHY with possible PCI;  Surgeon: Isaias Cowman, MD;  Location: [facility];  Service: Cardiovascular;  Laterality: N/A;    SOCIAL HISTORY: Social History   Socioeconomic History   Marital status: Widowed    Spouse name: Not on file   Number of children: Not on file   Years of education: Not on file   Highest education  level: Not on file  Occupational History   Not on file  Tobacco Use   Smoking status: Never   Smokeless tobacco: Never  Vaping Use   Vaping Use: Never used  Substance and Sexual Activity   Alcohol use: No   Drug use: No   Sexual activity: Not on file  Other Topics Concern   Not on  file  Social History Narrative   Not on file   Social Determinants of Health   Financial Resource Strain: Not on file  Food Insecurity: Not on file  Transportation Needs: Not on file  Physical Activity: Not on file  Stress: Not on file  Social Connections: Not on file  Intimate Partner Violence: Not on file    FAMILY HISTORY: Family History  Problem Relation Age of Onset   Cancer Mother    Heart disease Father    Breast cancer Maternal Aunt     ALLERGIES:  is allergic to butenafine, levofloxacin, limonene, penicillins, and terbinafine and related.  MEDICATIONS:  Current Outpatient Medications  Medication Sig Dispense Refill   Amino Acids-Protein Hydrolys (PRO-STAT) LIQD Take 30 mLs by mouth 2 (two) times daily.     amLODipine (NORVASC) 10 MG tablet Take 1 tablet (10 mg total) by mouth daily.     aspirin EC 81 MG EC tablet Take 1 tablet (81 mg total) by mouth daily. Swallow whole. 30 tablet 11   carboxymethylcellulose (REFRESH PLUS) 0.5 % SOLN 1 drop daily as needed.     cetirizine (ZYRTEC) 5 MG tablet Take 5 mg by mouth daily.     clopidogrel (PLAVIX) 75 MG tablet Take 75 mg by mouth daily.     Cranberry 400 MG CAPS Take 1 capsule by mouth 2 (two) times daily.     docusate sodium (COLACE) 100 MG capsule Take 2 capsules (200 mg total) by mouth 2 (two) times daily. 10 capsule 0   donepezil (ARICEPT) 10 MG tablet Take 10 mg by mouth daily.     fluticasone (FLONASE) 50 MCG/ACT nasal spray Place 1 spray into both nostrils daily.  2   isosorbide mononitrate (IMDUR) 60 MG 24 hr tablet Take 1 tablet (60 mg total) by mouth daily.     lidocaine (LIDODERM) 5 % Place 1 patch onto the skin daily. Remove & Discard patch within 12 hours or as directed by MD     magnesium oxide (MAG-OX) 400 MG tablet Take 400 mg by mouth daily.      Multiple Vitamins-Iron (MULTIVITAMIN/IRON PO) Take 1 tablet by mouth daily.     omeprazole (PRILOSEC) 20 MG capsule Take 20 mg by mouth daily.       polyethylene glycol (MIRALAX / GLYCOLAX) 17 g packet Take 17 g by mouth daily. 14 each 0   timolol (TIMOPTIC) 0.5 % ophthalmic solution Place 1 drop into both eyes at bedtime.     tizanidine (ZANAFLEX) 2 MG capsule Take 2 mg by mouth 2 (two) times daily as needed for muscle spasms.     vitamin B-12 (CYANOCOBALAMIN) 100 MCG tablet Take 100 mcg by mouth daily.     nitroGLYCERIN (NITROSTAT) 0.4 MG SL tablet Place 0.4 mg under the tongue every 5 (five) minutes as needed. For chest pain (Patient not taking: Reported on 02/17/2021)     No current facility-administered medications for this visit.     PHYSICAL EXAMINATION: ECOG PERFORMANCE STATUS: 2 - Symptomatic, <50% confined to bed Vitals:   02/17/21 1429  BP: (!) 168/46  Pulse: (!) 58  Resp: 18  Temp: (!) 97.3 F (36.3 C)   Filed Weights   02/17/21 1429  Weight: 127 lb 9.6 oz (57.9 kg)    Physical Exam Constitutional:      General: She is not in acute distress.    Comments: Patient sit in a wheelchair  HENT:     Head: Normocephalic and atraumatic.  Eyes:     General: No scleral icterus. Cardiovascular:     Rate and Rhythm: Normal rate and regular rhythm.     Heart sounds: Normal heart sounds.  Pulmonary:     Effort: Pulmonary effort is normal. No respiratory distress.     Breath sounds: No wheezing.  Abdominal:     General: Bowel sounds are normal. There is no distension.     Palpations: Abdomen is soft.  Musculoskeletal:        General: No deformity. Normal range of motion.     Cervical back: Normal range of motion and neck supple.  Skin:    General: Skin is warm and dry.     Findings: No erythema or rash.  Neurological:     Mental Status: She is alert and oriented to person, place, and time. Mental status is at baseline.     Cranial Nerves: No cranial nerve deficit.     Coordination: Coordination normal.  Psychiatric:        Mood and Affect: Mood normal.    LABORATORY DATA:  I have reviewed the data as  listed Lab Results  Component Value Date   WBC 9.8 01/27/2021   HGB 10.1 (L) 01/27/2021   HCT 30.4 (L) 01/27/2021   MCV 93.8 01/27/2021   PLT 496 (H) 01/27/2021   Recent Labs    12/18/20 0501 12/19/20 1507 01/27/21 1139  NA 136 134* 134*  K 4.8 4.7 4.0  CL 101 99 101  CO2 26 26 25   GLUCOSE 155* 221* 135*  BUN 46* 42* 26*  CREATININE 1.21* 1.16* 0.97  CALCIUM 10.0 10.0 9.4  GFRNONAA 43* 45* 56*  PROT 7.1 7.0 6.9  ALBUMIN 2.5* 2.3* 3.2*  AST 139* 100* 10*  ALT 136* 131* 9  ALKPHOS 223* 243* 88  BILITOT 0.5 0.4 <0.1*    Iron/TIBC/Ferritin/ %Sat    Component Value Date/Time   IRON 21 (L) 01/27/2021 1139   TIBC 200 (L) 01/27/2021 1139   FERRITIN 159 01/27/2021 1139   IRONPCTSAT 11 01/27/2021 1139       RADIOGRAPHIC STUDIES: I have personally reviewed the radiological images as listed and agreed with the findings in the report. No results found.    ASSESSMENT & PLAN:  1. Leukocytosis, unspecified type   2. Thrombocytosis   3. Normocytic anemia    #Leukocytosis,  Labs are reviewed and discussed with patient. Cbc showed normal total wbc. Previous leukocytosis is probably reactive.  Normal LDH, SPEP showed negative M protein, increased kappa light chain, normal kappa/lamda ratio.  Peripheral flowcytometry showed slightly increased light chain ration, no aberrant B cell antigen expression to suggest clonal population is present.   # Anemia, normocytic, retic panel showed normal retic hemoglobin.Hb 10.1 Normal B12 and folate level, smear showed normal morphology Iron panel showed low bordering iron saturation, normal ferritin 159.   #Thrombocytosis, questionable reactive due to chronic inflammation/infection.  She has elevated RF, recommend her to follow up with Rheumatology Will repeat in 3 months.    Orders Placed This Encounter  Procedures   CBC with Differential/Platelet    Standing Status:   Future  Standing Expiration Date:   02/17/2022    Comprehensive metabolic panel    Standing Status:   Future    Standing Expiration Date:   02/17/2022   JAK2 V617F, w Reflex to CALR/E12/MPL    Standing Status:   Future    Standing Expiration Date:   02/17/2022   BCR-ABL1 FISH    Standing Status:   Future    Standing Expiration Date:   02/17/2022    All questions were answered. The patient knows to call the clinic with any problems questions or concerns.  cc Danella Penton, MD    Return of visit: 3 months.    Rickard Patience, MD, PhD Hematology Oncology [facility]  02/17/2021

## 2021-02-17 NOTE — Progress Notes (Signed)
Pt here for follow up. Pt reports having frequent UTI's.

## 2021-02-24 ENCOUNTER — Other Ambulatory Visit: Payer: Self-pay

## 2021-02-24 ENCOUNTER — Ambulatory Visit (INDEPENDENT_AMBULATORY_CARE_PROVIDER_SITE_OTHER): Payer: Medicare Other | Admitting: Urology

## 2021-02-24 ENCOUNTER — Encounter: Payer: Self-pay | Admitting: Urology

## 2021-02-24 VITALS — BP 197/73 | HR 69 | Ht 64.0 in | Wt 127.0 lb

## 2021-02-24 DIAGNOSIS — N390 Urinary tract infection, site not specified: Secondary | ICD-10-CM

## 2021-02-24 DIAGNOSIS — N3000 Acute cystitis without hematuria: Secondary | ICD-10-CM

## 2021-02-24 LAB — BLADDER SCAN AMB NON-IMAGING

## 2021-02-24 MED ORDER — CEPHALEXIN 250 MG PO CAPS: 250.0000 mg | ORAL_CAPSULE | Freq: Every day | ORAL | 0 refills | Status: DC

## 2021-02-24 MED ORDER — CEPHALEXIN 250 MG PO CAPS
250.0000 mg | ORAL_CAPSULE | Freq: Every day | ORAL | 0 refills | Status: DC
Start: 2021-02-24 — End: 2022-02-06

## 2021-02-24 NOTE — Progress Notes (Signed)
02/24/21 1:55 PM   [name] [birth year] [id number]  CC: " Recurrent UTI"  HPI: 87 year old female who has been in rehab at [facility] for the last few months after a prolonged hospitalization.  She thinks she has had a few UTIs and been treated with antibiotics since her stay there, but those records are not available to me.  She reports her symptoms have improved with antibiotics previously.  Her symptoms are primarily dysuria and pelvic pressure.  She had multiple urinalyses and cultures over the last 2 years that have all been negative.  Her son is with her today and helps provide most of the history.  She was unable to void for urinalysis today, PVR normal at 17 mL.  CT in April 2019 was benign   PMH: Past Medical History:  Diagnosis Date   Hypercholesteremia    Hypertension    Myocardial infarction (HCC) 2008   S/P angioplasty with stent     Surgical History: Past Surgical History:  Procedure Laterality Date   ABDOMINAL HYSTERECTOMY     APPENDECTOMY     CORONARY ANGIOPLASTY WITH STENT PLACEMENT     CORONARY STENT INTERVENTION N/A 12/12/2019   Procedure: CORONARY STENT INTERVENTION;  Surgeon: Marcina Millard, MD;  Location: [facility] INVASIVE CV [facility];  Service: Cardiovascular;  Laterality: N/A;   LEFT HEART CATH AND CORONARY ANGIOGRAPHY N/A 12/12/2019   Procedure: LEFT HEART CATH AND CORONARY ANGIOGRAPHY with possible PCI;  Surgeon: Marcina Millard, MD;  Location: [facility] INVASIVE CV LAB;  Service: Cardiovascular;  Laterality: N/A;    Family History: Family History  Problem Relation Age of Onset   Cancer Mother    Heart disease Father    Breast cancer Maternal Aunt     Social History:  reports that she has never smoked. She has never used smokeless tobacco. She reports that she does not drink alcohol and does not use drugs.  Physical Exam: BP (!) 197/73 (BP Location: Left Arm, Patient Position: Sitting, Cuff Size: Normal)   Pulse 69   Ht 5\' 4"  (1.626 m)    Wt 127 lb (57.6 kg)   BMI 21.80 kg/m    Constitutional: Frail-appearing, in wheelchair Cardiovascular: No clubbing, cyanosis, or edema. Respiratory: Normal respiratory effort, no increased work of breathing. GI: Abdomen is soft, nontender, nondistended, no abdominal masses   Pertinent Imaging: I have personally viewed and interpreted the CT from April 2019 that shows no urologic abnormalities.  Assessment & Plan:   87 year old frail female with reported multiple UTIs over the last 2 months in rehab, but those records are unavailable to me.  Minimal symptoms today, and she is unable to void for urinalysis but PVR is normal.  We discussed the evaluation and treatment of patients with recurrent UTIs at length.  We specifically discussed the differences between asymptomatic bacteriuria and true urinary tract infection.  We discussed the AUA definition of recurrent UTI of at least 2 culture proven symptomatic acute cystitis episodes in a 40-month period, or 3 within a 1 year period.  We discussed the importance of culture directed antibiotic treatment, and antibiotic stewardship.  First-line therapy includes nitrofurantoin(5 days), Bactrim(3 days), or fosfomycin(3 g single dose).  Possible etiologies of recurrent infection include periurethral tissue atrophy in postmenopausal woman, constipation, sexual activity, incomplete emptying, anatomic abnormalities, and even genetic predisposition.  Finally, we discussed the role of perineal hygiene, timed voiding, adequate hydration, topical vaginal estrogen, cranberry prophylaxis, and low-dose antibiotic prophylaxis.  Recommended cranberry tablets twice daily as well as  low-dose Keflex prophylaxis for 60 days, with follow-up in 3 months.  Consider OAB medication with beta 3 agonist in the future if picture is more consistent with OAB than infections   Legrand Rams, MD 02/24/2021  [facility] [address] [place], [place] [address] [phone]

## 2021-02-24 NOTE — Patient Instructions (Signed)
Urinary Tract Infection, Adult A urinary tract infection (UTI) is an infection of any part of the urinary tract. The urinary tract includes the kidneys, ureters, bladder, and urethra. These organs make, store, and get rid of urine in the body. An upper UTI affects the ureters and kidneys. A lower UTI affects the bladder and urethra. What are the causes? Most urinary tract infections are caused by bacteria in your genital area around your urethra, where urine leaves your body. These bacteria grow and cause inflammation of your urinary tract. What increases the risk? You are more likely to develop this condition if: You have a urinary catheter that stays in place. You are not able to control when you urinate or have a bowel movement (incontinence). You are female and you: Use a spermicide or diaphragm for birth control. Have low estrogen levels. Are pregnant. You have certain genes that increase your risk. You are sexually active. You take antibiotic medicines. You have a condition that causes your flow of urine to slow down, such as: An enlarged prostate, if you are female. Blockage in your urethra. A kidney stone. A nerve condition that affects your bladder control (neurogenic bladder). Not getting enough to drink, or not urinating often. You have certain medical conditions, such as: Diabetes. A weak disease-fighting system (immunesystem). Sickle cell disease. Gout. Spinal cord injury. What are the signs or symptoms? Symptoms of this condition include: Needing to urinate right away (urgency). Frequent urination. This may include small amounts of urine each time you urinate. Pain or burning with urination. Blood in the urine. Urine that smells bad or unusual. Trouble urinating. Cloudy urine. Vaginal discharge, if you are female. Pain in the abdomen or the lower back. You may also have: Vomiting or a decreased appetite. Confusion. Irritability or tiredness. A fever or  chills. Diarrhea. The first symptom in older adults may be confusion. In some cases, they may not have any symptoms until the infection has worsened. How is this diagnosed? This condition is diagnosed based on your medical history and a physical exam. You may also have other tests, including: Urine tests. Blood tests. Tests for STIs (sexually transmitted infections). If you have had more than one UTI, a cystoscopy or imaging studies may be done to determine the cause of the infections. How is this treated? Treatment for this condition includes: Antibiotic medicine. Over-the-counter medicines to treat discomfort. Drinking enough water to stay hydrated. If you have frequent infections or have other conditions such as a kidney stone, you may need to see a health care provider who specializes in the urinary tract (urologist). In rare cases, urinary tract infections can cause sepsis. Sepsis is a life-threatening condition that occurs when the body responds to an infection. Sepsis is treated in the hospital with IV antibiotics, fluids, and other medicines. Follow these instructions at home: Medicines Take over-the-counter and prescription medicines only as told by your health care provider. If you were prescribed an antibiotic medicine, take it as told by your health care provider. Do not stop using the antibiotic even if you start to feel better. General instructions Make sure you: Empty your bladder often and completely. Do not hold urine for long periods of time. Empty your bladder after sex. Wipe from front to back after urinating or having a bowel movement if you are female. Use each tissue only one time when you wipe. Drink enough fluid to keep your urine pale yellow. Keep all follow-up visits. This is important. Contact a health care provider   if: Your symptoms do not get better after 1-2 days. Your symptoms go away and then return. Get help right away if: You have severe pain in your  back or your lower abdomen. You have a fever or chills. You have nausea or vomiting. Summary A urinary tract infection (UTI) is an infection of any part of the urinary tract, which includes the kidneys, ureters, bladder, and urethra. Most urinary tract infections are caused by bacteria in your genital area. Treatment for this condition often includes antibiotic medicines. If you were prescribed an antibiotic medicine, take it as told by your health care provider. Do not stop using the antibiotic even if you start to feel better. Keep all follow-up visits. This is important. This information is not intended to replace advice given to you by your health care provider. Make sure you discuss any questions you have with your health care provider. Document Revised: 11/17/2019 Document Reviewed: 11/17/2019 Elsevier Patient Education  2022 [employer] Inc[employer]  

## 2021-03-12 ENCOUNTER — Other Ambulatory Visit (INDEPENDENT_AMBULATORY_CARE_PROVIDER_SITE_OTHER): Payer: Self-pay | Admitting: Nurse Practitioner

## 2021-03-12 DIAGNOSIS — I6523 Occlusion and stenosis of bilateral carotid arteries: Secondary | ICD-10-CM

## 2021-03-17 ENCOUNTER — Ambulatory Visit (INDEPENDENT_AMBULATORY_CARE_PROVIDER_SITE_OTHER): Payer: Medicare Other | Admitting: Vascular Surgery

## 2021-03-17 ENCOUNTER — Other Ambulatory Visit: Payer: Self-pay

## 2021-03-17 ENCOUNTER — Encounter (INDEPENDENT_AMBULATORY_CARE_PROVIDER_SITE_OTHER): Payer: Self-pay | Admitting: Vascular Surgery

## 2021-03-17 ENCOUNTER — Ambulatory Visit (INDEPENDENT_AMBULATORY_CARE_PROVIDER_SITE_OTHER): Payer: Medicare Other

## 2021-03-17 VITALS — BP 145/57 | HR 69 | Resp 16 | Wt 130.4 lb

## 2021-03-17 DIAGNOSIS — E782 Mixed hyperlipidemia: Secondary | ICD-10-CM

## 2021-03-17 DIAGNOSIS — I10 Essential (primary) hypertension: Secondary | ICD-10-CM

## 2021-03-17 DIAGNOSIS — I6523 Occlusion and stenosis of bilateral carotid arteries: Secondary | ICD-10-CM

## 2021-03-17 DIAGNOSIS — I25118 Atherosclerotic heart disease of native coronary artery with other forms of angina pectoris: Secondary | ICD-10-CM | POA: Diagnosis not present

## 2021-03-17 DIAGNOSIS — E1159 Type 2 diabetes mellitus with other circulatory complications: Secondary | ICD-10-CM

## 2021-03-18 ENCOUNTER — Encounter (INDEPENDENT_AMBULATORY_CARE_PROVIDER_SITE_OTHER): Payer: Self-pay | Admitting: Vascular Surgery

## 2021-03-18 NOTE — Progress Notes (Signed)
MRN : [id number]  [name] is a 87 y.o. (12-06-1930) female who presents with chief complaint of check my neck.  History of Present Illness:   The patient is seen for follow up evaluation of carotid stenosis. The carotid stenosis followed by ultrasound.   The patient denies amaurosis fugax. There is no recent history of TIA symptoms or focal motor deficits. There is no prior documented CVA.  The patient is taking enteric-coated aspirin 81 mg daily.  There is no history of migraine headaches. There is no history of seizures.  The patient has a history of coronary artery disease, no recent episodes of angina or shortness of breath. The patient denies PAD or claudication symptoms. There is a history of hyperlipidemia which is being treated with a statin.    Carotid Duplex done today shows 50-69% RICA and 40-59% LICA.  No change compared to last study   Current Meds  Medication Sig   Amino Acids-Protein Hydrolys (PRO-STAT) LIQD Take 30 mLs by mouth 2 (two) times daily.   amLODipine (NORVASC) 10 MG tablet Take 1 tablet (10 mg total) by mouth daily.   aspirin EC 81 MG EC tablet Take 1 tablet (81 mg total) by mouth daily. Swallow whole.   carboxymethylcellulose (REFRESH PLUS) 0.5 % SOLN 1 drop daily as needed.   cephALEXin (KEFLEX) 250 MG capsule Take 1 capsule (250 mg total) by mouth daily.   cetirizine (ZYRTEC) 5 MG tablet Take 5 mg by mouth daily.   clopidogrel (PLAVIX) 75 MG tablet Take 75 mg by mouth daily.   Cranberry 400 MG CAPS Take 1 capsule by mouth 2 (two) times daily.   docusate sodium (COLACE) 100 MG capsule Take 2 capsules (200 mg total) by mouth 2 (two) times daily.   donepezil (ARICEPT) 10 MG tablet Take 10 mg by mouth daily.   fluticasone (FLONASE) 50 MCG/ACT nasal spray Place 1 spray into both nostrils daily.   isosorbide mononitrate (IMDUR) 60 MG 24 hr tablet Take 1 tablet (60 mg total) by mouth daily.   lidocaine (LIDODERM) 5 % Place 1 patch onto the skin  daily. Remove & Discard patch within 12 hours or as directed by MD   magnesium oxide (MAG-OX) 400 MG tablet Take 400 mg by mouth daily.    Multiple Vitamins-Iron (MULTIVITAMIN/IRON PO) Take 1 tablet by mouth daily.   nitroGLYCERIN (NITROSTAT) 0.4 MG SL tablet Place 0.4 mg under the tongue every 5 (five) minutes as needed. For chest pain   omeprazole (PRILOSEC) 20 MG capsule Take 20 mg by mouth daily.    polyethylene glycol (MIRALAX / GLYCOLAX) 17 g packet Take 17 g by mouth daily.   rosuvastatin (CRESTOR) 10 MG tablet Take 1 tablet by mouth daily.   timolol (TIMOPTIC) 0.5 % ophthalmic solution Place 1 drop into both eyes at bedtime.   tizanidine (ZANAFLEX) 2 MG capsule Take 2 mg by mouth 2 (two) times daily as needed for muscle spasms.   torsemide (DEMADEX) 20 MG tablet    vitamin B-12 (CYANOCOBALAMIN) 100 MCG tablet Take 100 mcg by mouth daily.    Past Medical History:  Diagnosis Date   Hypercholesteremia    Hypertension    Myocardial infarction (HCC) 2008   S/P angioplasty with stent     Past Surgical History:  Procedure Laterality Date   ABDOMINAL HYSTERECTOMY     APPENDECTOMY     CORONARY ANGIOPLASTY WITH STENT PLACEMENT     CORONARY STENT INTERVENTION N/A 12/12/2019   Procedure: CORONARY  STENT INTERVENTION;  Surgeon: Marcina Millard, MD;  Location: [facility] INVASIVE CV [facility];  Service: Cardiovascular;  Laterality: N/A;   LEFT HEART CATH AND CORONARY ANGIOGRAPHY N/A 12/12/2019   Procedure: LEFT HEART CATH AND CORONARY ANGIOGRAPHY with possible PCI;  Surgeon: Marcina Millard, MD;  Location: [facility] INV[facility] CV [facility];  Service: Cardiovascular;  Laterality: N/A;    Social History Social History   Tobacco Use   Smoking status: Never   Smokeless tobacco: Never  Vaping Use   Vaping Use: Never used  Substance Use Topics   Alcohol use: No   Drug use: No    Family History Family History  Problem Relation Age of Onset   Cancer Mother    Heart disease Father    Breast cancer  Maternal Aunt     Allergies  Allergen Reactions   Butenafine Swelling    Tongue swelled and turned red   Levofloxacin Swelling and Other (See Comments)    Other reaction(s): Other (See Comments) Tongue swelled and turned red   Limonene Other (See Comments)    Other reaction(s): Other (See Comments) Tongue swelled and turned red Tongue swelled and turned red    Penicillins     Other reaction(s): Unknown   Terbinafine And Related Other (See Comments)    Tongue swelled and turned red     REVIEW OF SYSTEMS (Negative unless checked)  Constitutional: [] Weight loss  [] Fever  [] Chills Cardiac: [] Chest pain   [] Chest pressure   [] Palpitations   [] Shortness of breath when laying flat   [] Shortness of breath with exertion. Vascular:  [] Pain in legs with walking   [] Pain in legs at rest  [] History of DVT   [] Phlebitis   [] Swelling in legs   [] Varicose veins   [] Non-healing ulcers Pulmonary:   [] Uses home oxygen   [] Productive cough   [] Hemoptysis   [] Wheeze  [] COPD   [] Asthma Neurologic:  [] Dizziness   [] Seizures   [] History of stroke   [] History of TIA  [] Aphasia   [] Vissual changes   [] Weakness or numbness in arm   [] Weakness or numbness in leg Musculoskeletal:   [] Joint swelling   [] Joint pain   [] Low back pain Hematologic:  [] Easy bruising  [] Easy bleeding   [] Hypercoagulable state   [] Anemic Gastrointestinal:  [] Diarrhea   [] Vomiting  [] Gastroesophageal reflux/heartburn   [] Difficulty swallowing. Genitourinary:  [] Chronic kidney disease   [] Difficult urination  [] Frequent urination   [] Blood in urine Skin:  [] Rashes   [] Ulcers  Psychological:  [] History of anxiety   []  History of major depression.  Physical Examination  Vitals:   03/17/21 1508  BP: (!) 145/57  Pulse: 69  Resp: 16  Weight: 130 lb 6.4 oz (59.1 kg)   Body mass index is 22.38 kg/m. Gen: WD/WN, NAD Head: [place]/AT, No temporalis wasting.  Ear/Nose/Throat: Hearing grossly intact, nares w/o erythema or drainage Eyes:  PER, EOMI, sclera nonicteric.  Neck: Supple, no masses.  No bruit or JVD.  Pulmonary:  Good air movement, no audible wheezing, no use of accessory muscles.  Cardiac: RRR, normal S1, S2, no Murmurs. Vascular:   Bilateral carotid bruits Vessel Right Left  Radial Palpable Palpable  Carotid Palpable Palpable  Gastrointestinal: soft, non-distended. No guarding/no peritoneal signs.  Musculoskeletal: M/S 5/5 throughout.  No visible deformity.  Neurologic: CN 2-12 intact. Pain and light touch intact in extremities.  Symmetrical.  Speech is fluent. Motor exam as listed above. Psychiatric: Judgment intact, Mood & affect appropriate for pt's clinical situation. Dermatologic: No rashes or ulcers  noted.  No changes consistent with cellulitis.   CBC Lab Results  Component Value Date   WBC 9.8 01/27/2021   HGB 10.1 (L) 01/27/2021   HCT 30.4 (L) 01/27/2021   MCV 93.8 01/27/2021   PLT 496 (H) 01/27/2021    BMET    Component Value Date/Time   NA 134 (L) 01/27/2021 1139   NA 139 08/16/2012 0255   K 4.0 01/27/2021 1139   K 3.9 08/16/2012 0255   CL 101 01/27/2021 1139   CL 108 (H) 08/16/2012 0255   CO2 25 01/27/2021 1139   CO2 25 08/16/2012 0255   GLUCOSE 135 (H) 01/27/2021 1139   GLUCOSE 129 (H) 08/16/2012 0255   BUN 26 (H) 01/27/2021 1139   BUN 21 (H) 08/16/2012 0255   CREATININE 0.97 01/27/2021 1139   CREATININE 1.43 (H) 08/16/2012 0255   CALCIUM 9.4 01/27/2021 1139   CALCIUM 8.5 08/16/2012 0255   GFRNONAA 56 (L) 01/27/2021 1139   GFRNONAA 35 (L) 08/16/2012 0255   GFRAA 46 (L) 12/13/2019 0519   GFRAA 40 (L) 08/16/2012 0255   CrCl cannot be calculated (Patient's most recent lab result is older than the maximum 21 days allowed.).  COAG Lab Results  Component Value Date   INR 0.9 08/26/2020   INR 0.8 12/11/2019   INR 1.0 11/17/2019    Radiology No results found.   Assessment/Plan 1. Bilateral carotid artery stenosis Recommend:  Given the patient's asymptomatic  subcritical stenosis no further invasive testing or surgery at this time.  Duplex ultrasound shows 40-69% stenosis bilaterally.  Continue antiplatelet therapy as prescribed Continue management of CAD, HTN and Hyperlipidemia Healthy heart diet,  encouraged exercise at least 4 times per week Follow up in 12 months with duplex ultrasound and physical exam.    - VAS US CAROTID; Future  2. Coronary artery disease of native artery of native heart with stable angina pectoris (HCC) Continue cardiac and antihypertensive medications as already ordered and reviewed, no changes at this time.  Continue statin as ordered and reviewed, no changes at this time  Nitrates PRN for chest pain   3. Essential hypertension Continue antihypertensive medications as already ordered, these medications have been reviewed and there are no changes at this time.   4. Type 2 diabetes mellitus with other circulatory complication, unspecified whether long term insulin use (HCC) Continue hypoglycemic medications as already ordered, these medications have been reviewed and there are no changes at this time.  Hgb A1C to be monitored as already arranged by primary service   5. Mixed hyperlipidemia Continue statin as ordered and reviewed, no changes at this time     Levora Dredge, MD  03/18/2021 6:10 PM

## 2021-05-20 ENCOUNTER — Inpatient Hospital Stay: Payer: Medicare Other | Admitting: Oncology

## 2021-05-20 ENCOUNTER — Ambulatory Visit: Payer: Medicare Other | Admitting: Oncology

## 2021-05-20 ENCOUNTER — Other Ambulatory Visit: Payer: Medicare Other

## 2021-05-20 ENCOUNTER — Inpatient Hospital Stay: Payer: Medicare Other | Attending: Oncology

## 2021-05-26 NOTE — Progress Notes (Deleted)
05/27/2021 9:36 PM   [name] 1930-05-15 [id number]  Referring provider: Rusty Aus, MD [facility] [place],  [place] [address]  No chief complaint on file.  Urological history: 1. rUTI's -contributing factors of age, vaginal atrophy, diabetes and dementia -documented UTI's over the last year  None -managed with cranberry tablets and Keflex 250 mg daily x 60 days    HPI: [name] is a 88 y.o. female who presents today for follow up after atrial of low-dose Keflex for "rUTI's."      PMH: Past Medical History:  Diagnosis Date   Hypercholesteremia    Hypertension    Myocardial infarction ([place]) 2008   S/P angioplasty with stent     Surgical History: Past Surgical History:  Procedure Laterality Date   ABDOMINAL HYSTERECTOMY     APPENDECTOMY     CORONARY ANGIOPLASTY WITH STENT PLACEMENT     CORONARY STENT INTERVENTION N/A 12/12/2019   Procedure: CORONARY STENT INTERVENTION;  Surgeon: Isaias Cowman, MD;  Location: [facility];  Service: Cardiovascular;  Laterality: N/A;   LEFT HEART CATH AND CORONARY ANGIOGRAPHY N/A 12/12/2019   Procedure: LEFT HEART CATH AND CORONARY ANGIOGRAPHY with possible PCI;  Surgeon: Isaias Cowman, MD;  Location: [facility];  Service: Cardiovascular;  Laterality: N/A;    Home Medications:  Allergies as of 05/27/2021       Reactions   Butenafine Swelling   Tongue swelled and turned red   Levofloxacin Swelling, Other (See Comments)   Other reaction(s): Other (See Comments) Tongue swelled and turned red   Limonene Other (See Comments)   Other reaction(s): Other (See Comments) Tongue swelled and turned red Tongue swelled and turned red   Penicillins    Other reaction(s): Unknown   Terbinafine And Related Other (See Comments)   Tongue swelled and turned red        Medication List        Accurate as of May 26, 2021  9:36 PM. If you have any  questions, ask your nurse or doctor.          amLODipine 10 MG tablet Commonly known as: NORVASC Take 1 tablet (10 mg total) by mouth daily.   aspirin 81 MG EC tablet Take 1 tablet (81 mg total) by mouth daily. Swallow whole.   carboxymethylcellulose 0.5 % Soln Commonly known as: REFRESH PLUS 1 drop daily as needed.   cephALEXin 250 MG capsule Commonly known as: Keflex Take 1 capsule (250 mg total) by mouth daily.   cetirizine 5 MG tablet Commonly known as: ZYRTEC Take 5 mg by mouth daily.   clopidogrel 75 MG tablet Commonly known as: PLAVIX Take 75 mg by mouth daily.   Cranberry 400 MG Caps Take 1 capsule by mouth 2 (two) times daily.   docusate sodium 100 MG capsule Commonly known as: COLACE Take 2 capsules (200 mg total) by mouth 2 (two) times daily.   donepezil 10 MG tablet Commonly known as: ARICEPT Take 10 mg by mouth daily.   fluticasone 50 MCG/ACT nasal spray Commonly known as: FLONASE Place 1 spray into both nostrils daily.   isosorbide mononitrate 60 MG 24 hr tablet Commonly known as: IMDUR Take 1 tablet (60 mg total) by mouth daily.   lidocaine 5 % Commonly known as: LIDODERM Place 1 patch onto the skin daily. Remove & Discard patch within 12 hours or as directed by MD   magnesium oxide 400 MG tablet  Commonly known as: MAG-OX Take 400 mg by mouth daily.   MULTIVITAMIN/IRON PO Take 1 tablet by mouth daily.   nitroGLYCERIN 0.4 MG SL tablet Commonly known as: NITROSTAT Place 0.4 mg under the tongue every 5 (five) minutes as needed. For chest pain   omeprazole 20 MG capsule Commonly known as: PRILOSEC Take 20 mg by mouth daily.   polyethylene glycol 17 g packet Commonly known as: MIRALAX / GLYCOLAX Take 17 g by mouth daily.   Pro-Stat Liqd Take 30 mLs by mouth 2 (two) times daily.   rosuvastatin 10 MG tablet Commonly known as: CRESTOR Take 1 tablet by mouth daily.   timolol 0.5 % ophthalmic solution Commonly known as:  TIMOPTIC Place 1 drop into both eyes at bedtime.   tizanidine 2 MG capsule Commonly known as: ZANAFLEX Take 2 mg by mouth 2 (two) times daily as needed for muscle spasms.   torsemide 20 MG tablet Commonly known as: DEMADEX   vitamin B-12 100 MCG tablet Commonly known as: CYANOCOBALAMIN Take 100 mcg by mouth daily.        Allergies:  Allergies  Allergen Reactions   Butenafine Swelling    Tongue swelled and turned red   Levofloxacin Swelling and Other (See Comments)    Other reaction(s): Other (See Comments) Tongue swelled and turned red   Limonene Other (See Comments)    Other reaction(s): Other (See Comments) Tongue swelled and turned red Tongue swelled and turned red    Penicillins     Other reaction(s): Unknown   Terbinafine And Related Other (See Comments)    Tongue swelled and turned red    Family History: Family History  Problem Relation Age of Onset   Cancer Mother    Heart disease Father    Breast cancer Maternal Aunt     Social History:  reports that she has never smoked. She has never used smokeless tobacco. She reports that she does not drink alcohol and does not use drugs.  ROS: Pertinent ROS in HPI  Physical Exam: There were no vitals taken for this visit.  Constitutional:  Well nourished. Alert and oriented, No acute distress. HEENT: [place] AT, moist mucus membranes.  Trachea midline, no masses. Cardiovascular: No clubbing, cyanosis, or edema. Respiratory: Normal respiratory effort, no increased work of breathing. GI: Abdomen is soft, non tender, non distended, no abdominal masses. Liver and spleen not palpable.  No hernias appreciated.  Stool sample for occult testing is not indicated.   GU: No CVA tenderness.  No bladder fullness or masses.  *** external genitalia, *** pubic hair distribution, no lesions.  Normal urethral meatus, no lesions, no prolapse, no discharge.   No urethral masses, tenderness and/or tenderness. No bladder fullness,  tenderness or masses. *** vagina mucosa, *** estrogen effect, no discharge, no lesions, *** pelvic support, *** cystocele and *** rectocele noted.  No cervical motion tenderness.  Uterus is freely mobile and non-fixed.  No adnexal/parametria masses or tenderness noted.  Anus and perineum are without rashes or lesions.   ***  Skin: No rashes, bruises or suspicious lesions. Lymph: No cervical or inguinal adenopathy. Neurologic: Grossly intact, no focal deficits, moving all 4 extremities. Psychiatric: Normal mood and affect.    Laboratory Data: WBC (White Blood Cell Count) 4.1 - 10.2 103/uL 9.0   RBC (Red Blood Cell Count) 4.04 - 5.48 106/uL 3.69 Low    Hemoglobin 12.0 - 15.0 gm/dL 11.5 Low    Hematocrit 35.0 - 47.0 % 34.6 Low  MCV (Mean Corpuscular Volume) 80.0 - 100.0 fl 93.8   MCH (Mean Corpuscular Hemoglobin) 27.0 - 31.2 pg 31.2   MCHC (Mean Corpuscular Hemoglobin Concentration) 32.0 - 36.0 gm/dL 33.2   Platelet Count 150 - 450 103/uL 395   RDW-CV (Red Cell Distribution Width) 11.6 - 14.8 % 15.9 High    MPV (Mean Platelet Volume) 9.4 - 12.4 fl 9.9   Neutrophils 1.50 - 7.80 103/uL 4.70   Lymphocytes 1.00 - 3.60 103/uL 2.85   Monocytes 0.00 - 1.50 103/uL 0.81   Eosinophils 0.00 - 0.55 103/uL 0.56 High    Basophils 0.00 - 0.09 103/uL 0.09   Neutrophil % 32.0 - 70.0 % 52.0   Lymphocyte % 10.0 - 50.0 % 31.6   Monocyte % 4.0 - 13.0 % 9.0   Eosinophil % 1.0 - 5.0 % 6.2 High    Basophil% 0.0 - 2.0 % 1.0   Immature Granulocyte % <=0.7 % 0.2   Immature Granulocyte Count <=0.06 10^3/L 0.02   Resulting Agency  [facility] - [facility]  Specimen Collected: 03/11/21 17:23 Last Resulted: 03/11/21 17:46  Received From: [facility]  Result Received: 03/12/21 13:00   Glucose 70 - 110 mg/dL 128 High    Sodium 136 - 145 mmol/L 138   Potassium 3.6 - 5.1 mmol/L 4.0   Chloride 97 - 109 mmol/L 101   Carbon Dioxide (CO2) 22.0 - 32.0 mmol/L 29.2   Urea Nitrogen (BUN) 7 -  25 mg/dL 21   Creatinine 0.6 - 1.1 mg/dL 1.1   Glomerular Filtration Rate (eGFR), MDRD Estimate >60 mL/min/1.73sq m 47 Low    Calcium 8.7 - 10.3 mg/dL 9.8   AST  8 - 39 U/L 15   ALT  5 - 38 U/L 9   Alk Phos (alkaline Phosphatase) 34 - 104 U/L 97   Albumin 3.5 - 4.8 g/dL 4.0   Bilirubin, Total 0.3 - 1.2 mg/dL 0.3   Protein, Total 6.1 - 7.9 g/dL 6.7   A/G Ratio 1.0 - 5.0 gm/dL 1.5   Resulting Agency  [facility] - LAB  Specimen Collected: 03/11/21 17:23 Last Resulted: 03/12/21 11:19  Received From: [facility]  Result Received: 03/12/21 13:00   Color Yellow, Violet, Light Violet, Dark Violet Yellow   Clarity Clear, Other Clear   Specific Gravity 1.000 - 1.030 1.010   pH, Urine 5.0 - 8.0 5.5   Protein, Urinalysis Negative, Trace mg/dL Negative   Glucose, Urinalysis Negative mg/dL Negative   Ketones, Urinalysis Negative mg/dL Negative   Blood, Urinalysis Negative Negative   Nitrite, Urinalysis Negative Negative   Leukocyte Esterase, Urinalysis Negative Negative   White Blood Cells, Urinalysis None Seen, 0-3 /hpf None Seen   Red Blood Cells, Urinalysis None Seen, 0-3 /hpf None Seen   Bacteria, Urinalysis None Seen /hpf None Seen   Squamous Epithelial Cells, Urinalysis Rare, Few, None Seen /hpf None Seen   Resulting Agency  [facility] - LAB  Specimen Collected: 03/11/21 17:23 Last Resulted: 03/11/21 17:51  Received From: [facility]  Result Received: 03/12/21 13:00   Thyroid Stimulating Hormone (TSH) 0.450-5.330 uIU/ml uIU/mL 1.774   Comment: Reference Range for Pregnant Females >= 18 yrs old:  Normal Range for 1st trimester: 0.05-3.70 ulU/ml  Normal Range for 2nd trimester: 0.31-4.35 ulU/ml  Resulting Agency  [facility] - [facility]  Specimen Collected: 03/11/21 17:23 Last Resulted: 03/12/21 10:56  Received From: [facility]  Result Received: 03/12/21 13:00  Hemoglobin A1C 4.2 - 5.6 % 6.7 High    Average  Blood Glucose (Calc) mg/dL [place]  Narrative Performed by [facility] - LAB Normal Range:    4.2 - 5.6%  Increased Risk:  5.7 - 6.4%  Diabetes:        >= 6.5%  Glycemic Control for adults with diabetes:  <7%   Specimen Collected: 03/11/21 17:23 Last Resulted: 03/12/21 08:59  Received From: [facility]  Result Received: 03/12/21 13:00  I have reviewed the labs.   Pertinent Imaging: N/A  Assessment & Plan:  ***  1. rUTI's ***  No follow-ups on file.  These notes generated with voice recognition software. I apologize for typographical errors.  Zara Council, PA-C  [facility] [address]  [place], [place] [address] [phone]

## 2021-05-27 ENCOUNTER — Encounter: Payer: Self-pay | Admitting: Urology

## 2021-05-27 ENCOUNTER — Ambulatory Visit: Payer: Medicare Other | Admitting: Urology

## 2021-05-27 DIAGNOSIS — N390 Urinary tract infection, site not specified: Secondary | ICD-10-CM

## 2021-10-02 ENCOUNTER — Emergency Department: Payer: Medicare Other

## 2021-10-02 ENCOUNTER — Other Ambulatory Visit: Payer: Self-pay

## 2021-10-02 ENCOUNTER — Emergency Department
Admission: EM | Admit: 2021-10-02 | Discharge: 2021-10-02 | Disposition: A | Discharge status: Home / Self Care | Payer: Medicare Other | Attending: Emergency Medicine | Admitting: Emergency Medicine

## 2021-10-02 DIAGNOSIS — S0990XA Unspecified injury of head, initial encounter: Secondary | ICD-10-CM | POA: Diagnosis present

## 2021-10-02 DIAGNOSIS — I2510 Atherosclerotic heart disease of native coronary artery without angina pectoris: Secondary | ICD-10-CM | POA: Insufficient documentation

## 2021-10-02 DIAGNOSIS — I10 Essential (primary) hypertension: Secondary | ICD-10-CM | POA: Insufficient documentation

## 2021-10-02 DIAGNOSIS — W01198A Fall on same level from slipping, tripping and stumbling with subsequent striking against other object, initial encounter: Secondary | ICD-10-CM | POA: Diagnosis not present

## 2021-10-02 DIAGNOSIS — W19XXXA Unspecified fall, initial encounter: Secondary | ICD-10-CM

## 2021-10-02 DIAGNOSIS — S0003XA Contusion of scalp, initial encounter: Secondary | ICD-10-CM | POA: Insufficient documentation

## 2021-10-02 NOTE — ED Notes (Signed)
Pt to ED for fall that occurred this afternoon, pt states that she was on bench and tried standing out and pt believes "feet got tangled up". Pt hit head on bookcase, no hematoma present, pt on blood thinner. Pt denies LOC.  Per pt she uses walker at home, and able to ambulate to stretcher.   Pt is A&ox4

## 2021-10-02 NOTE — ED Triage Notes (Signed)
Patient to ER via [facility] from home. Patient reports ground level, mechanical fall from the bench at her piano this afternoon. Reports hitting the back of her head on the bookshelf but denies LOC. Patient alert and oriented x4.   Denies blood thinner usage, takes baby aspirin.

## 2021-10-02 NOTE — ED Provider Notes (Signed)
   [facility] Provider Note    Event Date/Time   First MD Initiated Contact with Patient 10/02/21 1652     (approximate)   History   Fall   HPI  [name] is a 88 y.o. female with a history of CAD, hypertension who presents after a fall.  Patient reports she slipped off her piano bench and hit her head on a bookcase.  She complains of mild pain in her low back as well as posterior scalp.  No reported blood thinners.  No abdominal pain, no chest pain, no extremity injuries reported.     Physical Exam   Triage Vital Signs: ED Triage Vitals  Enc Vitals Group     BP 10/02/21 1625 (!) 197/51     Pulse Rate 10/02/21 1625 91     Resp 10/02/21 1625 18     Temp 10/02/21 1625 98.1 F (36.7 C)     Temp Source 10/02/21 1625 Oral     SpO2 10/02/21 1625 98 %     Weight 10/02/21 1658 59.1 kg (130 lb 4.7 oz)     Height 10/02/21 1625 1.626 m (5\' 4" )     Head Circumference --      Peak Flow --      Pain Score 10/02/21 1625 8     Pain Loc --      Pain Edu? --      Excl. in GC? --     Most recent vital signs: Vitals:   10/02/21 1625  BP: (!) 197/51  Pulse: 91  Resp: 18  Temp: 98.1 F (36.7 C)  SpO2: 98%     General: Awake, no distress.  CV:  Good peripheral perfusion.  No chest wall tenderness palpation Resp:  Normal effort.  Abd:  No distention.  No abdominal tenderness palpation Other:  Moves all extremities well and without pain. Small hematoma posterior scalp, no bleeding  Mild vertebral tenderness palpation at approximately L2, normal strength in the lower extremities.  Cranial nerves II through XII are normal   ED Results / Procedures / Treatments   Labs (all labs ordered are listed, but only abnormal results are displayed) Labs Reviewed - No data to display   EKG     RADIOLOGY CT head viewed interpreted by me, no ICH    PROCEDURES:  Critical Care performed:   Procedures   MEDICATIONS ORDERED IN ED: Medications - No  data to display   IMPRESSION / MDM / ASSESSMENT AND PLAN / ED COURSE  I reviewed the triage vital signs and the nursing notes. Patient's presentation is most consistent with acute presentation with potential threat to life or bodily function.  Patient presents with fall with head injury.  Differential includes contusion, concussion, ICH, compression fracture  Overall reassuring exam.  We will send for CT head, cervical spine, x-ray lumbar and thoracic spine  CT head cervical spine are reassuring, lumbar and thoracic x-rays without acute fractures.  Patient appropriate for discharge at this time with outpatient follow-up as needed      FINAL CLINICAL IMPRESSION(S) / ED DIAGNOSES   Final diagnoses:  Fall, initial encounter  Injury of head, initial encounter     Rx / DC Orders   ED Discharge Orders     None        Note:  This document was prepared using Dragon voice recognition software and may include unintentional dictation errors.   05-16-1968, MD 10/02/21 1827

## 2021-10-27 ENCOUNTER — Ambulatory Visit: Admit: 2021-10-27 | Payer: Medicare Other | Admitting: Ophthalmology

## 2021-10-27 SURGERY — PHACOEMULSIFICATION, CATARACT, WITH IOL INSERTION
Anesthesia: Topical | Laterality: Left

## 2022-02-04 ENCOUNTER — Other Ambulatory Visit: Payer: Self-pay

## 2022-02-04 ENCOUNTER — Emergency Department: Payer: Medicare Other

## 2022-02-04 DIAGNOSIS — I2510 Atherosclerotic heart disease of native coronary artery without angina pectoris: Secondary | ICD-10-CM | POA: Insufficient documentation

## 2022-02-04 DIAGNOSIS — I6523 Occlusion and stenosis of bilateral carotid arteries: Secondary | ICD-10-CM | POA: Diagnosis not present

## 2022-02-04 DIAGNOSIS — R55 Syncope and collapse: Secondary | ICD-10-CM | POA: Diagnosis present

## 2022-02-04 DIAGNOSIS — I10 Essential (primary) hypertension: Secondary | ICD-10-CM | POA: Insufficient documentation

## 2022-02-04 DIAGNOSIS — Z7982 Long term (current) use of aspirin: Secondary | ICD-10-CM | POA: Diagnosis not present

## 2022-02-04 DIAGNOSIS — Z79899 Other long term (current) drug therapy: Secondary | ICD-10-CM | POA: Diagnosis not present

## 2022-02-04 DIAGNOSIS — Z955 Presence of coronary angioplasty implant and graft: Secondary | ICD-10-CM | POA: Insufficient documentation

## 2022-02-04 DIAGNOSIS — Z7902 Long term (current) use of antithrombotics/antiplatelets: Secondary | ICD-10-CM | POA: Insufficient documentation

## 2022-02-04 DIAGNOSIS — I447 Left bundle-branch block, unspecified: Secondary | ICD-10-CM | POA: Insufficient documentation

## 2022-02-04 DIAGNOSIS — E782 Mixed hyperlipidemia: Secondary | ICD-10-CM | POA: Insufficient documentation

## 2022-02-04 DIAGNOSIS — E1169 Type 2 diabetes mellitus with other specified complication: Secondary | ICD-10-CM | POA: Diagnosis not present

## 2022-02-04 LAB — CBC
HCT: 37.3 % (ref 36.0–46.0)
Hemoglobin: 12.7 g/dL (ref 12.0–15.0)
MCH: 32.1 pg (ref 26.0–34.0)
MCHC: 34 g/dL (ref 30.0–36.0)
MCV: 94.2 fL (ref 80.0–100.0)
Platelets: 338 10*3/uL (ref 150–400)
RBC: 3.96 MIL/uL (ref 3.87–5.11)
RDW: 13.2 % (ref 11.5–15.5)
WBC: 12.8 10*3/uL — ABNORMAL HIGH (ref 4.0–10.5)
nRBC: 0 % (ref 0.0–0.2)

## 2022-02-04 LAB — URINALYSIS, ROUTINE W REFLEX MICROSCOPIC
Bilirubin Urine: NEGATIVE
Glucose, UA: NEGATIVE mg/dL
Hgb urine dipstick: NEGATIVE
Ketones, ur: NEGATIVE mg/dL
Leukocytes,Ua: NEGATIVE
Nitrite: NEGATIVE
Protein, ur: NEGATIVE mg/dL
Specific Gravity, Urine: 1.01 (ref 1.005–1.030)
pH: 7 (ref 5.0–8.0)

## 2022-02-04 LAB — BASIC METABOLIC PANEL
Anion gap: 10 (ref 5–15)
BUN: 26 mg/dL — ABNORMAL HIGH (ref 8–23)
CO2: 26 mmol/L (ref 22–32)
Calcium: 9.7 mg/dL (ref 8.9–10.3)
Chloride: 100 mmol/L (ref 98–111)
Creatinine, Ser: 1.17 mg/dL — ABNORMAL HIGH (ref 0.44–1.00)
GFR, Estimated: 45 mL/min — ABNORMAL LOW (ref >60)
Glucose, Bld: 154 mg/dL — ABNORMAL HIGH (ref 70–99)
Potassium: 4.1 mmol/L (ref 3.5–5.1)
Sodium: 136 mmol/L (ref 135–145)

## 2022-02-04 LAB — TROPONIN I (HIGH SENSITIVITY)
Troponin I (High Sensitivity): 10 ng/L (ref <18)
Troponin I (High Sensitivity): 11 ng/L (ref <18)

## 2022-02-04 NOTE — ED Triage Notes (Signed)
First Nurse Note:  Pt via EMS from home. Pt had a syncopal episode. Pt felt like she was going to pass out, sat her on down onto the floor. Family reports LOC. Pt is A&Ox4 and NAD  205/80 BP 158 CBG 85 HR  14 RR 100% on RA 20 G L AC

## 2022-02-04 NOTE — ED Provider Triage Note (Signed)
Emergency Medicine Provider Triage Evaluation Note  [name] , a 88 y.o. female  was evaluated in triage.  Pt complains of syncopal episode, witnessed by family.  Review of Systems  Positive:  Negative:   Physical Exam  BP (!) 172/60   Pulse 88   Temp 98.2 F (36.8 C)   Resp 18   Wt 59 kg   SpO2 100%   BMI 22.31 kg/m  Gen:   Awake, no distress  Resp:  Normal effort  MSK:   Moves extremities without difficulty  Other:    Medical Decision Making  Medically screening exam initiated at 7:07 PM.  Appropriate orders placed.  [name] was informed that the remainder of the evaluation will be completed by another provider, this initial triage assessment does not replace that evaluation, and the importance of remaining in the ED until their evaluation is complete.     Versie Starks, PA-C 02/04/22 1909

## 2022-02-05 ENCOUNTER — Emergency Department: Payer: Medicare Other

## 2022-02-05 ENCOUNTER — Observation Stay: Payer: Medicare Other

## 2022-02-05 ENCOUNTER — Observation Stay
Admission: EM | Admit: 2022-02-05 | Discharge: 2022-02-06 | Disposition: A | Discharge status: Home / Self Care | Payer: Medicare Other | Attending: Osteopathic Medicine | Admitting: Osteopathic Medicine

## 2022-02-05 DIAGNOSIS — I447 Left bundle-branch block, unspecified: Secondary | ICD-10-CM

## 2022-02-05 DIAGNOSIS — R55 Syncope and collapse: Secondary | ICD-10-CM | POA: Diagnosis present

## 2022-02-05 DIAGNOSIS — I2510 Atherosclerotic heart disease of native coronary artery without angina pectoris: Secondary | ICD-10-CM | POA: Diagnosis not present

## 2022-02-05 DIAGNOSIS — E782 Mixed hyperlipidemia: Secondary | ICD-10-CM | POA: Diagnosis present

## 2022-02-05 DIAGNOSIS — E119 Type 2 diabetes mellitus without complications: Secondary | ICD-10-CM

## 2022-02-05 DIAGNOSIS — I10 Essential (primary) hypertension: Secondary | ICD-10-CM

## 2022-02-05 LAB — CBC
HCT: 35.6 % — ABNORMAL LOW (ref 36.0–46.0)
Hemoglobin: 12 g/dL (ref 12.0–15.0)
MCH: 31.9 pg (ref 26.0–34.0)
MCHC: 33.7 g/dL (ref 30.0–36.0)
MCV: 94.7 fL (ref 80.0–100.0)
Platelets: 322 10*3/uL (ref 150–400)
RBC: 3.76 MIL/uL — ABNORMAL LOW (ref 3.87–5.11)
RDW: 13.2 % (ref 11.5–15.5)
WBC: 9.3 10*3/uL (ref 4.0–10.5)
nRBC: 0 % (ref 0.0–0.2)

## 2022-02-05 LAB — BASIC METABOLIC PANEL
Anion gap: 10 (ref 5–15)
BUN: 22 mg/dL (ref 8–23)
CO2: 25 mmol/L (ref 22–32)
Calcium: 9.5 mg/dL (ref 8.9–10.3)
Chloride: 105 mmol/L (ref 98–111)
Creatinine, Ser: 1.12 mg/dL — ABNORMAL HIGH (ref 0.44–1.00)
GFR, Estimated: 47 mL/min — ABNORMAL LOW (ref >60)
Glucose, Bld: 117 mg/dL — ABNORMAL HIGH (ref 70–99)
Potassium: 3.8 mmol/L (ref 3.5–5.1)
Sodium: 140 mmol/L (ref 135–145)

## 2022-02-05 LAB — HEMOGLOBIN A1C
Hgb A1c MFr Bld: 6.5 % — ABNORMAL HIGH (ref 4.8–5.6)
Mean Plasma Glucose: 139.85 mg/dL

## 2022-02-05 LAB — CBG MONITORING, ED
Glucose-Capillary: 113 mg/dL — ABNORMAL HIGH (ref 70–99)
Glucose-Capillary: 123 mg/dL — ABNORMAL HIGH (ref 70–99)
Glucose-Capillary: 133 mg/dL — ABNORMAL HIGH (ref 70–99)
Glucose-Capillary: 142 mg/dL — ABNORMAL HIGH (ref 70–99)
Glucose-Capillary: 144 mg/dL — ABNORMAL HIGH (ref 70–99)

## 2022-02-05 MED ORDER — POLYVINYL ALCOHOL 1.4 % OP SOLN: 1.0000 [drp] | Freq: Every day | OPHTHALMIC | Status: DC | PRN

## 2022-02-05 MED ORDER — FLUTICASONE PROPIONATE 50 MCG/ACT NA SUSP: 1.0000 | Freq: Every day | NASAL | Status: DC | PRN

## 2022-02-05 MED ORDER — DONEPEZIL HCL 5 MG PO TABS
10.0000 mg | ORAL_TABLET | Freq: Every day | ORAL | Status: DC
  Administered 2022-02-05 – 2022-02-06 (×2): 10 mg via ORAL
  Filled 2022-02-05 (×2): qty 2

## 2022-02-05 MED ORDER — CEPHALEXIN 250 MG PO CAPS: 250.0000 mg | ORAL_CAPSULE | Freq: Every day | ORAL | Status: DC

## 2022-02-05 MED ORDER — VITAMIN B-12 100 MCG PO TABS
100.0000 ug | ORAL_TABLET | Freq: Every day | ORAL | Status: DC
Start: 2022-02-05 — End: 2022-02-06
  Administered 2022-02-05 – 2022-02-06 (×2): 100 ug via ORAL
  Filled 2022-02-05 (×2): qty 1

## 2022-02-05 MED ORDER — ASPIRIN 81 MG PO TBEC
81.0000 mg | DELAYED_RELEASE_TABLET | Freq: Every day | ORAL | Status: DC
  Administered 2022-02-05 – 2022-02-06 (×2): 81 mg via ORAL
  Filled 2022-02-05 (×2): qty 1

## 2022-02-05 MED ORDER — PANTOPRAZOLE SODIUM 40 MG PO TBEC
40.0000 mg | DELAYED_RELEASE_TABLET | Freq: Every day | ORAL | Status: DC
  Administered 2022-02-05 – 2022-02-06 (×2): 40 mg via ORAL
  Filled 2022-02-05 (×2): qty 1

## 2022-02-05 MED ORDER — SODIUM CHLORIDE 0.9 % IV SOLN: INTRAVENOUS | Status: DC

## 2022-02-05 MED ORDER — TIZANIDINE HCL 2 MG PO TABS
2.0000 mg | ORAL_TABLET | Freq: Two times a day (BID) | ORAL | Status: DC | PRN
Start: 2022-02-05 — End: 2022-02-06

## 2022-02-05 MED ORDER — LORATADINE 10 MG PO TABS
10.0000 mg | ORAL_TABLET | Freq: Every day | ORAL | Status: DC
  Administered 2022-02-05 – 2022-02-06 (×2): 10 mg via ORAL
  Filled 2022-02-05 (×2): qty 1

## 2022-02-05 MED ORDER — TRAZODONE HCL 50 MG PO TABS: 25.0000 mg | ORAL_TABLET | Freq: Every evening | ORAL | Status: DC | PRN

## 2022-02-05 MED ORDER — ONDANSETRON HCL 4 MG PO TABS: 4.0000 mg | ORAL_TABLET | Freq: Four times a day (QID) | ORAL | Status: DC | PRN

## 2022-02-05 MED ORDER — ISOSORBIDE MONONITRATE ER 60 MG PO TB24
60.0000 mg | ORAL_TABLET | Freq: Every day | ORAL | Status: DC
  Administered 2022-02-05 – 2022-02-06 (×2): 60 mg via ORAL
  Filled 2022-02-05 (×2): qty 1

## 2022-02-05 MED ORDER — CRANBERRY 400 MG PO CAPS: 1.0000 | ORAL_CAPSULE | Freq: Two times a day (BID) | ORAL | Status: DC

## 2022-02-05 MED ORDER — MAGNESIUM OXIDE 400 MG PO TABS
400.0000 mg | ORAL_TABLET | Freq: Every day | ORAL | Status: DC
  Administered 2022-02-05 – 2022-02-06 (×2): 400 mg via ORAL
  Filled 2022-02-05 (×3): qty 1

## 2022-02-05 MED ORDER — TORSEMIDE 20 MG PO TABS
20.0000 mg | ORAL_TABLET | Freq: Every day | ORAL | Status: DC
  Administered 2022-02-05 – 2022-02-06 (×2): 20 mg via ORAL
  Filled 2022-02-05 (×2): qty 1

## 2022-02-05 MED ORDER — ONDANSETRON HCL 4 MG/2ML IJ SOLN: 4.0000 mg | Freq: Four times a day (QID) | INTRAMUSCULAR | Status: DC | PRN

## 2022-02-05 MED ORDER — POLYETHYLENE GLYCOL 3350 17 G PO PACK
17.0000 g | PACK | Freq: Every day | ORAL | Status: DC
  Administered 2022-02-05 – 2022-02-06 (×2): 17 g via ORAL
  Filled 2022-02-05 (×2): qty 1

## 2022-02-05 MED ORDER — LIDOCAINE 5 % EX PTCH: 1.0000 | MEDICATED_PATCH | CUTANEOUS | Status: DC

## 2022-02-05 MED ORDER — MAGNESIUM HYDROXIDE 400 MG/5ML PO SUSP
30.0000 mL | Freq: Every day | ORAL | Status: DC | PRN
  Administered 2022-02-06: 30 mL via ORAL
  Filled 2022-02-05: qty 30

## 2022-02-05 MED ORDER — NITROGLYCERIN 0.4 MG SL SUBL: 0.4000 mg | SUBLINGUAL_TABLET | SUBLINGUAL | Status: DC | PRN

## 2022-02-05 MED ORDER — ENOXAPARIN SODIUM 30 MG/0.3ML IJ SOSY
30.0000 mg | PREFILLED_SYRINGE | INTRAMUSCULAR | Status: DC
  Administered 2022-02-05 – 2022-02-06 (×2): 30 mg via SUBCUTANEOUS
  Filled 2022-02-05 (×2): qty 0.3

## 2022-02-05 MED ORDER — CLOPIDOGREL BISULFATE 75 MG PO TABS
75.0000 mg | ORAL_TABLET | Freq: Every day | ORAL | Status: DC
  Administered 2022-02-05 – 2022-02-06 (×2): 75 mg via ORAL
  Filled 2022-02-05 (×2): qty 1

## 2022-02-05 MED ORDER — SODIUM CHLORIDE 0.9% FLUSH
3.0000 mL | Freq: Two times a day (BID) | INTRAVENOUS | Status: DC
  Administered 2022-02-05 – 2022-02-06 (×3): 3 mL via INTRAVENOUS

## 2022-02-05 MED ORDER — INSULIN ASPART 100 UNIT/ML IJ SOLN
0.0000 [IU] | Freq: Three times a day (TID) | INTRAMUSCULAR | Status: DC
  Administered 2022-02-05: 2 [IU] via SUBCUTANEOUS
  Administered 2022-02-05 – 2022-02-06 (×2): 1 [IU] via SUBCUTANEOUS
  Filled 2022-02-05 (×3): qty 1

## 2022-02-05 MED ORDER — AMLODIPINE BESYLATE 5 MG PO TABS
10.0000 mg | ORAL_TABLET | Freq: Every day | ORAL | Status: DC
  Administered 2022-02-05 – 2022-02-06 (×2): 10 mg via ORAL
  Filled 2022-02-05 (×2): qty 2

## 2022-02-05 MED ORDER — ROSUVASTATIN CALCIUM 5 MG PO TABS
5.0000 mg | ORAL_TABLET | Freq: Every day | ORAL | Status: DC
  Administered 2022-02-05 – 2022-02-06 (×2): 5 mg via ORAL
  Filled 2022-02-05 (×2): qty 1

## 2022-02-05 MED ORDER — ACETAMINOPHEN 325 MG PO TABS: 650.0000 mg | ORAL_TABLET | Freq: Four times a day (QID) | ORAL | Status: DC | PRN

## 2022-02-05 MED ORDER — DOCUSATE SODIUM 100 MG PO CAPS
200.0000 mg | ORAL_CAPSULE | Freq: Two times a day (BID) | ORAL | Status: DC
  Administered 2022-02-05 – 2022-02-06 (×3): 200 mg via ORAL
  Filled 2022-02-05 (×3): qty 2

## 2022-02-05 MED ORDER — TIMOLOL MALEATE 0.5 % OP SOLN
1.0000 [drp] | Freq: Two times a day (BID) | OPHTHALMIC | Status: DC
  Administered 2022-02-06: 1 [drp] via OPHTHALMIC
  Filled 2022-02-05: qty 5

## 2022-02-05 MED ORDER — ACETAMINOPHEN 650 MG RE SUPP: 650.0000 mg | Freq: Four times a day (QID) | RECTAL | Status: DC | PRN

## 2022-02-05 NOTE — Progress Notes (Signed)
PHARMACIST - PHYSICIAN COMMUNICATION  CONCERNING:  Enoxaparin (Lovenox) for DVT Prophylaxis    RECOMMENDATION: Patient was prescribed enoxaprin 40mg  q24 hours for VTE prophylaxis.   Filed Weights   02/04/22 1904  Weight: 59 kg (130 lb)    Body mass index is 22.31 kg/m.  Estimated Creatinine Clearance: 28.1 mL/min (A) (by C-G formula based on SCr of 1.17 mg/dL (H)).  Patient is candidate for enoxaparin 30mg  every 24 hours based on CrCl <68ml/min or Weight <45kg  DESCRIPTION: Pharmacy has adjusted enoxaparin dose per [facility] policy.  Patient is now receiving enoxaparin 30 mg every 24 hours   Renda Rolls, PharmD, [facility] 02/05/2022 4:33 AM

## 2022-02-05 NOTE — Progress Notes (Signed)
PHARMACIST - PHYSICIAN ORDER COMMUNICATION  CONCERNING: P&T Medication Policy on Herbal Medications  DESCRIPTION:  This patient's order(s) for: Cranberry 400 mg Caps has been noted.  This product(s) is classified as an "herbal" or natural product. Due to a lack of definitive safety studies or FDA approval, nonstandard manufacturing practices, plus the potential risk of unknown drug-drug interactions while on inpatient medications, the Pharmacy and Therapeutics Committee does not permit the use of "herbal" or natural products of this type within [facility].   ACTION TAKEN: The pharmacy department is unable to verify this order at this time.  Please reevaluate patient's clinical condition at discharge and address if the herbal or natural product(s) should be resumed at that time.  Renda Rolls, PharmD, MBA 02/05/2022 5:00 AM

## 2022-02-05 NOTE — Progress Notes (Signed)
  Brief Progress Note (See full H&P from earlier today)   Subjective: Patient confirms history as noted in H&P: Yesterday was washing up/bathing at her sink, sat down to wash her feet, shortly after sitting down became very lightheaded and called her son into the room.  She reports she at the time did not think she passed out but son, who was with her, said she did.  She reports a few years ago, August 2020, had another episode of lightheadedness and passed out, hit her head at that point.   On record review, also seen in the ED 08/26/2020 for syncope type event.  Follows with [facility], seen most recently 03/17/2021 for bilateral carotid artery stenosis, notes at that time duplex ultrasound 40 to 69% stenosis bilaterally, given asymptomatic/subcritical stenosis no further invasive testing/surgery was recommended.  Continue antiplatelet therapy, manage CAD HTN/HLD.  Objective: Relevant new results:  Carotid US: 50 to 69% bilateral ICA stenosis Echocardiogram pending Physical Exam:  BP (!) 162/56   Pulse 78   Temp 98.5 F (36.9 C)   Resp 18   Wt 59 kg   SpO2 99%   BMI 22.31 kg/m  Constitutional:  General Appearance: alert, well-developed, well-nourished, NAD Respiratory: Normal respiratory effort Breath sounds normal, no wheeze/rhonchi/rales Cardiovascular: S1/S2 normal, no murmur/rub/gallop auscultated No lower extremity edema Carotid bruit, faint on left, a bit louder on right Gastrointestinal: Nontender, no masses Musculoskeletal:  No clubbing/cyanosis of digits Neurological: No cranial nerve deficit on limited exam Psychiatric: Normal judgment/insight Normal mood and affect   Assessment/Plan changes or updates compared to H&P: Syncopal event, possible orthostatic versus developing symptoms of carotid stenosis, echocardiogram pending.  Will reach out to vascular surgery to see if recommendations remain same from this visit last year versus  further work-up.    Time spent: 35 minutes

## 2022-02-05 NOTE — Assessment & Plan Note (Signed)
-   We will continue statin therapy. 

## 2022-02-05 NOTE — Assessment & Plan Note (Signed)
-   We will continue Plavix, statin therapy, aspirin and Imdur.

## 2022-02-05 NOTE — Assessment & Plan Note (Addendum)
-   The patient will be placed on supplement coverage with NovoLog. - We will continue her diabetic regimen and monitor for hypoglycemia.

## 2022-02-05 NOTE — Consult Note (Signed)
[facility] Consult Note  MRN : [id number]  [name] is a 88 y.o. (May 02, 1930) female who presents with chief complaint of  Chief Complaint  Patient presents with   Loss of Consciousness  .  History of Present Illness: I am asked to see the patient by Dr. Sheppard Coil for carotid stenosis with syncope.  The patient is known to our service and has seen Dr. Delana Meyer in our office previously.  She has had carotid disease followed for several years now.  She reports taking a bath and being at the sink and beginning to feel lightheaded.  She does not recall any arm or leg weakness or numbness.  No speech or swallowing difficulty.  No facial droop.  No visual symptoms.  She was unsure that she passed out but her son reported a loss of consciousness.  When she awoke, there were no focal neurologic symptoms and she was not postictal.  She is awake alert and oriented now.  She had a carotid duplex which I have reviewed which demonstrated 50 to 69% stenosis bilaterally but duplex velocity criteria.  The velocities were actually slightly better than they were at her most recent duplex which was in November 2022.  She is on dual antiplatelet therapy as well as Crestor.  Current Facility-Administered Medications  Medication Dose Route Frequency Provider Last Rate Last Admin   acetaminophen (TYLENOL) tablet 650 mg  650 mg Oral Q6H PRN Mansy, Jan A, MD       Or   acetaminophen (TYLENOL) suppository 650 mg  650 mg Rectal Q6H PRN Mansy, Jan A, MD       amLODipine (NORVASC) tablet 10 mg  10 mg Oral Daily Mansy, Jan A, MD   10 mg at 02/05/22 1008   aspirin EC tablet 81 mg  81 mg Oral Daily Mansy, Jan A, MD   81 mg at 02/05/22 1008   clopidogrel (PLAVIX) tablet 75 mg  75 mg Oral Daily Mansy, Jan A, MD   75 mg at 02/05/22 1009   docusate sodium (COLACE) capsule 200 mg  200 mg Oral BID Mansy, Jan A, MD   200 mg at 02/05/22 1218   donepezil (ARICEPT) tablet 10 mg  10 mg Oral Daily  Mansy, Jan A, MD   10 mg at 02/05/22 1008   enoxaparin (LOVENOX) injection 30 mg  30 mg Subcutaneous Q24H Mansy, Jan A, MD   30 mg at 02/05/22 1007   fluticasone (FLONASE) 50 MCG/ACT nasal spray 1 spray  1 spray Each Nare Daily PRN Mansy, Jan A, MD       insulin aspart (novoLOG) injection 0-9 Units  0-9 Units Subcutaneous TID WC Mansy, Jan A, MD   1 Units at 02/05/22 1007   isosorbide mononitrate (IMDUR) 24 hr tablet 60 mg  60 mg Oral Daily Mansy, Jan A, MD   60 mg at 02/05/22 1009   loratadine (CLARITIN) tablet 10 mg  10 mg Oral Daily Mansy, Jan A, MD   10 mg at 02/05/22 1218   magnesium hydroxide (MILK OF MAGNESIA) suspension 30 mL  30 mL Oral Daily PRN Mansy, Jan A, MD       magnesium oxide (MAG-OX) tablet 400 mg  400 mg Oral Daily Mansy, Jan A, MD   400 mg at 02/05/22 1009   nitroGLYCERIN (NITROSTAT) SL tablet 0.4 mg  0.4 mg Sublingual Q5 min PRN Mansy, Jan A, MD       ondansetron [facility]) tablet 4 mg  4 mg Oral Q6H PRN Mansy, Jan A, MD       Or   ondansetron [facility] injection 4 mg  4 mg Intravenous Q6H PRN Mansy, Jan A, MD       pantoprazole (PROTONIX) EC tablet 40 mg  40 mg Oral Daily Mansy, Jan A, MD   40 mg at 02/05/22 1218   polyethylene glycol (MIRALAX / GLYCOLAX) packet 17 g  17 g Oral Daily Mansy, Jan A, MD   17 g at 02/05/22 1218   polyvinyl alcohol (LIQUIFILM TEARS) 1.4 % ophthalmic solution 1 drop  1 drop Both Eyes Daily PRN Mansy, Jan A, MD       rosuvastatin (CRESTOR) tablet 5 mg  5 mg Oral Daily Mansy, Jan A, MD   5 mg at 02/05/22 1217   sodium chloride flush (NS) 0.9 % injection 3 mL  3 mL Intravenous Q12H Mansy, Jan A, MD   3 mL at 02/05/22 1008   [START ON 02/06/2022] timolol (TIMOPTIC) 0.5 % ophthalmic solution 1 drop  1 drop Both Eyes BID Mansy, Jan A, MD       tiZANidine (ZANAFLEX) tablet 2 mg  2 mg Oral BID PRN Mansy, Jan A, MD       torsemide [facility]) tablet 20 mg  20 mg Oral Daily Mansy, Jan A, MD   20 mg at 02/05/22 1218   traZODone (DESYREL) tablet 25 mg  25 mg Oral  QHS PRN Mansy, Jan A, MD       vitamin B-12 (CYANOCOBALAMIN) tablet 100 mcg  100 mcg Oral Daily Mansy, Jan A, MD   100 mcg at 02/05/22 1217   Current Outpatient Medications  Medication Sig Dispense Refill   amLODipine (NORVASC) 10 MG tablet Take 1 tablet (10 mg total) by mouth daily.     aspirin EC 81 MG EC tablet Take 1 tablet (81 mg total) by mouth daily. Swallow whole. 30 tablet 11   B12-ACTIVE 1 MG CHEW      carboxymethylcellulose (REFRESH PLUS) 0.5 % SOLN 1 drop daily as needed.     clopidogrel (PLAVIX) 75 MG tablet Take 75 mg by mouth daily.     Cranberry 400 MG CAPS Take 1 capsule by mouth 2 (two) times daily.     donepezil (ARICEPT) 10 MG tablet Take 10 mg by mouth daily.     isosorbide mononitrate (IMDUR) 60 MG 24 hr tablet Take 1 tablet (60 mg total) by mouth daily.     magnesium oxide (MAG-OX) 400 MG tablet Take 400 mg by mouth daily.      Multiple Vitamins-Iron (MULTIVITAMIN/IRON PO) Take 1 tablet by mouth daily.     neomycin-polymyxin b-dexamethasone (MAXITROL) 3.5-10000-0.1 OINT at bedtime.     nitrofurantoin (MACRODANTIN) 50 MG capsule Take 50 mg by mouth daily.     omeprazole (PRILOSEC) 20 MG capsule Take 20 mg by mouth daily.      rosuvastatin (CRESTOR) 5 MG tablet Take 5 mg by mouth daily.     timolol (TIMOPTIC) 0.5 % ophthalmic solution Place 1 drop into both eyes at bedtime.     torsemide (DEMADEX) 20 MG tablet      vitamin B-12 (CYANOCOBALAMIN) 100 MCG tablet Take 100 mcg by mouth daily.     Amino Acids-Protein Hydrolys (PRO-STAT) LIQD Take 30 mLs by mouth 2 (two) times daily.     cephALEXin (KEFLEX) 250 MG capsule Take 1 capsule (250 mg total) by mouth daily. (Patient not taking: Reported on 02/05/2022) 60 capsule 0  cetirizine (ZYRTEC) 5 MG tablet Take 5 mg by mouth daily.     diphenhydrAMINE (BENADRYL) 25 MG tablet      docusate sodium (COLACE) 100 MG capsule Take 2 capsules (200 mg total) by mouth 2 (two) times daily. 10 capsule 0   fluticasone (FLONASE) 50  MCG/ACT nasal spray Place 1 spray into both nostrils daily. (Patient not taking: Reported on 02/05/2022)  2   lidocaine (LIDODERM) 5 % Place 1 patch onto the skin daily. Remove & Discard patch within 12 hours or as directed by MD (Patient not taking: Reported on 02/05/2022)     nitroGLYCERIN (NITROSTAT) 0.4 MG SL tablet Place 0.4 mg under the tongue every 5 (five) minutes as needed. For chest pain     polyethylene glycol (MIRALAX / GLYCOLAX) 17 g packet Take 17 g by mouth daily. 14 each 0   rosuvastatin (CRESTOR) 10 MG tablet Take 1 tablet by mouth daily. (Patient not taking: Reported on 02/05/2022)     tizanidine (ZANAFLEX) 2 MG capsule Take 2 mg by mouth 2 (two) times daily as needed for muscle spasms.      Past Medical History:  Diagnosis Date   Hypercholesteremia    Hypertension    Myocardial infarction ([place]) 2008   S/P angioplasty with stent     Past Surgical History:  Procedure Laterality Date   ABDOMINAL HYSTERECTOMY     APPENDECTOMY     CORONARY ANGIOPLASTY WITH STENT PLACEMENT     CORONARY STENT INTERVENTION N/A 12/12/2019   Procedure: CORONARY STENT INTERVENTION;  Surgeon: Isaias Cowman, MD;  Location: [facility];  Service: Cardiovascular;  Laterality: N/A;   LEFT HEART CATH AND CORONARY ANGIOGRAPHY N/A 12/12/2019   Procedure: LEFT HEART CATH AND CORONARY ANGIOGRAPHY with possible PCI;  Surgeon: Isaias Cowman, MD;  Location: [facility];  Service: Cardiovascular;  Laterality: N/A;     Social History   Tobacco Use   Smoking status: Never   Smokeless tobacco: Never  Vaping Use   Vaping Use: Never used  Substance Use Topics   Alcohol use: No   Drug use: No     Family History  Problem Relation Age of Onset   Cancer Mother    Heart disease Father    Breast cancer Maternal Aunt     Allergies  Allergen Reactions   Butenafine Swelling    Tongue swelled and turned red   Levofloxacin Swelling and Other (See Comments)    Other  reaction(s): Other (See Comments) Tongue swelled and turned red   Limonene Other (See Comments)    Other reaction(s): Other (See Comments) Tongue swelled and turned red Tongue swelled and turned red    Penicillins     Other reaction(s): Unknown   Terbinafine And Related Other (See Comments)    Tongue swelled and turned red     REVIEW OF SYSTEMS (Negative unless checked)  Constitutional: [] Weight loss  [] Fever  [] Chills Cardiac: [] Chest pain   [] Chest pressure   [] Palpitations   [] Shortness of breath when laying flat   [] Shortness of breath at rest   [] Shortness of breath with exertion. Vascular:  [] Pain in legs with walking   [] Pain in legs at rest   [] Pain in legs when laying flat   [] Claudication   [] Pain in feet when walking  [] Pain in feet at rest  [] Pain in feet when laying flat   [] History of DVT   [] Phlebitis   [] Swelling in legs   [] Varicose veins   [] Non-healing  ulcers Pulmonary:   [] Uses home oxygen   [] Productive cough   [] Hemoptysis   [] Wheeze  [] COPD   [] Asthma Neurologic:  [] Dizziness  [x] Blackouts   [] Seizures   [] History of stroke   [] History of TIA  [] Aphasia   [] Temporary blindness   [] Dysphagia   [] Weakness or numbness in arms   [] Weakness or numbness in legs Musculoskeletal:  [x] Arthritis   [] Joint swelling   [x] Joint pain   [] Low back pain Hematologic:  [] Easy bruising  [] Easy bleeding   [] Hypercoagulable state   [] Anemic  [] Hepatitis Gastrointestinal:  [] Blood in stool   [] Vomiting blood  [] Gastroesophageal reflux/heartburn   [] Difficulty swallowing. Genitourinary:  [] Chronic kidney disease   [] Difficult urination  [] Frequent urination  [] Burning with urination   [] Blood in urine Skin:  [] Rashes   [] Ulcers   [] Wounds Psychological:  [] History of anxiety   []  History of major depression.  Physical Examination  Vitals:   02/05/22 0200 02/05/22 0430 02/05/22 0521 02/05/22 0759  BP: (!) 176/55 (!) 161/47  (!) 162/56  Pulse: 82 75  78  Resp: 16 18  18   Temp:   98.2  F (36.8 C) 98.5 F (36.9 C)  TempSrc:   Oral   SpO2: 100% 99%  99%  Weight:       Body mass index is 22.31 kg/m. Gen:  WD/WN, NAD.  Appears younger than stated age Head: [place]/AT, No temporalis wasting.  Ear/Nose/Throat: Hearing grossly intact, nares w/o erythema or drainage, oropharynx w/o Erythema/Exudate Eyes: Sclera non-icteric, conjunctiva clear Neck: Trachea midline.  No JVD.  Bilateral carotid bruits Pulmonary:  Good air movement, respirations not labored, equal bilaterally.  Cardiac: RRR, no JVD Vascular:  Vessel Right Left  Radial Palpable Palpable                                    Musculoskeletal: M/S 5/5 throughout.  Extremities without ischemic changes.  No deformity or atrophy. No edema. Neurologic: Sensation grossly intact in extremities.  Symmetrical.  Speech is fluent. Motor exam as listed above. Psychiatric: Judgment intact, Mood & affect appropriate for pt's clinical situation. Dermatologic: No rashes or ulcers noted.  No cellulitis or open wounds.      CBC Lab Results  Component Value Date   WBC 9.3 02/05/2022   HGB 12.0 02/05/2022   HCT 35.6 (L) 02/05/2022   MCV 94.7 02/05/2022   PLT 322 02/05/2022    BMET    Component Value Date/Time   NA 140 02/05/2022 0517   NA 139 08/16/2012 0255   K 3.8 02/05/2022 0517   K 3.9 08/16/2012 0255   CL 105 02/05/2022 0517   CL 108 (H) 08/16/2012 0255   CO2 25 02/05/2022 0517   CO2 25 08/16/2012 0255   GLUCOSE 117 (H) 02/05/2022 0517   GLUCOSE 129 (H) 08/16/2012 0255   BUN 22 02/05/2022 0517   BUN 21 (H) 08/16/2012 0255   CREATININE 1.12 (H) 02/05/2022 0517   CREATININE 1.43 (H) 08/16/2012 0255   CALCIUM 9.5 02/05/2022 0517   CALCIUM 8.5 08/16/2012 0255   GFRNONAA 47 (L) 02/05/2022 0517   GFRNONAA 35 (L) 08/16/2012 0255   GFRAA 46 (L) 12/13/2019 0519   GFRAA 40 (L) 08/16/2012 0255   Estimated Creatinine Clearance: 29.4 mL/min (A) (by C-G formula based on SCr of 1.12 mg/dL (H)).  COAG Lab  Results  Component Value Date   INR 0.9 08/26/2020   INR 0.8  12/11/2019   INR 1.0 11/17/2019    Radiology US Carotid Bilateral  Result Date: 02/05/2022 CLINICAL DATA:  Syncope Hypertension Diabetes Hyperlipidemia EXAM: BILATERAL CAROTID DUPLEX ULTRASOUND TECHNIQUE: Pearline Cables scale imaging, color Doppler and duplex ultrasound were performed of bilateral carotid and vertebral arteries in the neck. COMPARISON:  MRA neck 12/18/2020 Carotid Doppler 12/11/2020 FINDINGS: Criteria: Quantification of carotid stenosis is based on velocity parameters that correlate the residual internal carotid diameter with NASCET-based stenosis levels, using the diameter of the distal internal carotid lumen as the denominator for stenosis measurement. The following velocity measurements were obtained: RIGHT ICA: 158/23 cm/sec CCA: [id number] cm/sec SYSTOLIC ICA/CCA RATIO:  1.8 ECA: 99 cm/sec LEFT ICA: 164/29 cm/sec CCA: [id number] cm/sec SYSTOLIC ICA/CCA RATIO:  1.7 ECA: 176 cm/sec RIGHT CAROTID ARTERY: Mild calcified plaque of the right carotid bifurcation. RIGHT VERTEBRAL ARTERY:  Antegrade flow. LEFT CAROTID ARTERY: Moderate shadowing calcified plaque of the left carotid bulb. Long segment calcified plaque of the proximal left internal carotid artery. LEFT VERTEBRAL ARTERY:  Antegrade flow. IMPRESSION: 1. Moderate shadowing calcified plaque of the left carotid bulb and proximal left internal carotid artery with Doppler measurements compatible with 50-69% stenosis of the left internal carotid artery. 2. Mild calcified plaque of the right carotid bifurcation with Doppler measurements indicative of 50-69% stenosis of the right internal carotid artery. Electronically Signed   By: Miachel Roux M.D.   On: 02/05/2022 12:05   DG Chest Portable 1 View  Result Date: 02/05/2022 CLINICAL DATA:  Syncope EXAM: PORTABLE CHEST 1 VIEW COMPARISON:  Radiographs 12/14/2020 FINDINGS: Stable cardiomediastinal silhouette. Coronary stenting. Aortic  atherosclerotic calcification. No focal consolidation, pleural effusion, or pneumothorax. No acute osseous abnormality. IMPRESSION: No active disease. Electronically Signed   By: Placido Sou M.D.   On: 02/05/2022 03:24   CT HEAD WO CONTRAST  Result Date: 02/04/2022 CLINICAL DATA:  Syncope. EXAM: CT HEAD WITHOUT CONTRAST TECHNIQUE: Contiguous axial images were obtained from the base of the skull through the vertex without intravenous contrast. RADIATION DOSE REDUCTION: This exam was performed according to the departmental dose-optimization program which includes automated exposure control, adjustment of the mA and/or kV according to patient size and/or use of iterative reconstruction technique. COMPARISON:  October 02, 2021 FINDINGS: Brain: There is moderate severity cerebral atrophy with widening of the extra-axial spaces and ventricular dilatation. There are areas of decreased attenuation within the white matter tracts of the supratentorial brain, consistent with microvascular disease changes. Vascular: There is marked severity calcification of the bilateral cavernous carotid arteries. Skull: Normal. Negative for fracture or focal lesion. Sinuses/Orbits: No acute finding. Other: None. IMPRESSION: 1. No acute intracranial abnormality. 2. Generalized cerebral atrophy and microvascular disease changes of the supratentorial brain. Electronically Signed   By: Virgina Norfolk M.D.   On: 02/04/2022 20:00      Assessment/Plan 1.  Moderate bilateral carotid artery stenosis.  Has had recent syncope but her duplex are unchanged for many years now.  Given her advanced age, and no obvious focal neurologic symptoms, intervention is unlikely to be of great benefit at this point.  Although the carotid disease could be a cause of syncope, cardiac issues or arrhythmias are more likely to be the cause.  Would continue antiplatelet and statin agent.  Follow-up in our office for continued surveillance 2.  Syncope.  Cause  not entirely clear.  Did not have any obvious focal neurologic symptoms and carotid duplex is stable from multiple previous studies.  Cardiac work-up including echocardiogram also pending. 3.  Hypertension.  Stable on outpatient medications and blood pressure control important in reducing the progression of atherosclerotic disease. On appropriate oral medications. 4.  Hyperlipidemia. lipid control important in reducing the progression of atherosclerotic disease. Continue statin therapy    Leotis Pain, MD  02/05/2022 4:31 PM    This note was created with Dragon medical transcription system.  Any error is purely unintentional

## 2022-02-05 NOTE — H&P (Signed)
[name]   PATIENT NAME: [name]    MR#:  [id number]  DATE OF BIRTH:  October 11, 1930  DATE OF ADMISSION:  02/05/2022  PRIMARY CARE PHYSICIAN: Danella Penton, MD   Patient is coming from: Home  REQUESTING/REFERRING PHYSICIAN: Dorothea Glassman, MD  CHIEF COMPLAINT:   Chief Complaint  Patient presents with   Loss of Consciousness    HISTORY OF PRESENT ILLNESS:  [name] is a 88 y.o. Caucasian female with medical history significant for hypertension and dyslipidemia as well as coronary artery disease status post PCI and stent, who presented to the emergency room with acute onset of syncope while sitting down after having a bath.  She was apparently unconscious for about 2 minutes.  She denies any headache or dizziness or blurred vision.  No paresthesias or focal muscle weakness.  She is been having mild urinary frequency without dysuria or oliguria or hematuria or flank pain.  She has a left lower lid stye.  No chest pain or palpitations.  No leg pain or recent travels or surgeries.  ED Course: When she came to the ER, BP was 172/60 with otherwise normal vital signs.  Labs revealed BUN of 26 with creatinine 1.17 and high sensitive troponin was 10 and later 11.  CBC showed leukocytosis of 12.8. EKG as reviewed by me : EKG showed normal sinus rhythm with possible left atrial enlargement and left bundle blanch block of new onset with a rate of 87 Imaging: Noncontrast head CT scan showed generalized cerebral atrophy and microvascular disease changes of the supratentorial brain with no acute intracranial abnormality.  The patient will admitted to an observation medical telemetry bed for further evaluation and management. PAST MEDICAL HISTORY:   Past Medical History:  Diagnosis Date   Hypercholesteremia    Hypertension    Myocardial infarction (HCC) 2008   S/P angioplasty with stent     PAST SURGICAL HISTORY:   Past Surgical History:  Procedure Laterality Date    ABDOMINAL HYSTERECTOMY     APPENDECTOMY     CORONARY ANGIOPLASTY WITH STENT PLACEMENT     CORONARY STENT INTERVENTION N/A 12/12/2019   Procedure: CORONARY STENT INTERVENTION;  Surgeon: Marcina Millard, MD;  Location: [facility] INVASIVE CV [facility];  Service: Cardiovascular;  Laterality: N/A;   LEFT HEART CATH AND CORONARY ANGIOGRAPHY N/A 12/12/2019   Procedure: LEFT HEART CATH AND CORONARY ANGIOGRAPHY with possible PCI;  Surgeon: Marcina Millard, MD;  Location: [facility] INVASIVE CV [facility];  Service: Cardiovascular;  Laterality: N/A;    SOCIAL HISTORY:   Social History   Tobacco Use   Smoking status: Never   Smokeless tobacco: Never  Substance Use Topics   Alcohol use: No    FAMILY HISTORY:   Family History  Problem Relation Age of Onset   Cancer Mother    Heart disease Father    Breast cancer Maternal Aunt     DRUG ALLERGIES:   Allergies  Allergen Reactions   Butenafine Swelling    Tongue swelled and turned red   Levofloxacin Swelling and Other (See Comments)    Other reaction(s): Other (See Comments) Tongue swelled and turned red   Limonene Other (See Comments)    Other reaction(s): Other (See Comments) Tongue swelled and turned red Tongue swelled and turned red    Penicillins     Other reaction(s): Unknown   Terbinafine And Related Other (See Comments)    Tongue swelled and turned red    REVIEW OF SYSTEMS:  ROS As per history of present illness. All pertinent systems were reviewed above. Constitutional, HEENT, cardiovascular, respiratory, GI, GU, musculoskeletal, neuro, psychiatric, endocrine, integumentary and hematologic systems were reviewed and are otherwise negative/unremarkable except for positive findings mentioned above in the HPI.   MEDICATIONS AT HOME:   Prior to Admission medications   Medication Sig Start Date End Date Taking? Authorizing Provider  Amino Acids-Protein Hydrolys (PRO-STAT) LIQD Take 30 mLs by mouth 2 (two) times daily.    [provider]  amLODipine (NORVASC) 10 MG tablet Take 1 tablet (10 mg total) by mouth daily. 12/21/20   Lynn Ito, MD  aspirin EC 81 MG EC tablet Take 1 tablet (81 mg total) by mouth daily. Swallow whole. 12/21/20   Lynn Ito, MD  carboxymethylcellulose (REFRESH PLUS) 0.5 % SOLN 1 drop daily as needed.    [provider]  cephALEXin (KEFLEX) 250 MG capsule Take 1 capsule (250 mg total) by mouth daily. 02/24/21   Sondra Come, MD  cetirizine (ZYRTEC) 5 MG tablet Take 5 mg by mouth daily.    [provider]  clopidogrel (PLAVIX) 75 MG tablet Take 75 mg by mouth daily.    [provider]  Cranberry 400 MG CAPS Take 1 capsule by mouth 2 (two) times daily.    [provider]  docusate sodium (COLACE) 100 MG capsule Take 2 capsules (200 mg total) by mouth 2 (two) times daily. 12/20/20   Lynn Ito, MD  donepezil (ARICEPT) 10 MG tablet Take 10 mg by mouth daily. 08/16/20   [provider]  fluticasone (FLONASE) 50 MCG/ACT nasal spray Place 1 spray into both nostrils daily. 12/21/20   Lynn Ito, MD  isosorbide mononitrate (IMDUR) 60 MG 24 hr tablet Take 1 tablet (60 mg total) by mouth daily. 12/21/20   Lynn Ito, MD  lidocaine (LIDODERM) 5 % Place 1 patch onto the skin daily. Remove & Discard patch within 12 hours or as directed by MD    [provider]  magnesium oxide (MAG-OX) 400 MG tablet Take 400 mg by mouth daily.     [provider]  Multiple Vitamins-Iron (MULTIVITAMIN/IRON PO) Take 1 tablet by mouth daily.    [provider]  nitroGLYCERIN (NITROSTAT) 0.4 MG SL tablet Place 0.4 mg under the tongue every 5 (five) minutes as needed. For chest pain    [provider]  omeprazole (PRILOSEC) 20 MG capsule Take 20 mg by mouth daily.  11/22/18   [provider]  polyethylene glycol (MIRALAX / GLYCOLAX) 17 g packet Take 17 g by mouth daily. 12/21/20   Lynn Ito, MD  rosuvastatin (CRESTOR) 10 MG tablet Take 1  tablet by mouth daily.    [provider]  timolol (TIMOPTIC) 0.5 % ophthalmic solution Place 1 drop into both eyes at bedtime. 06/26/19   [provider]  tizanidine (ZANAFLEX) 2 MG capsule Take 2 mg by mouth 2 (two) times daily as needed for muscle spasms.    [provider]  torsemide (DEMADEX) 20 MG tablet  03/10/21   [provider]  vitamin B-12 (CYANOCOBALAMIN) 100 MCG tablet Take 100 mcg by mouth daily.    [provider]      VITAL SIGNS:  Blood pressure (!) 161/47, pulse 75, temperature 98.2 F (36.8 C), temperature source Oral, resp. rate 18, weight 59 kg, SpO2 99 %.  PHYSICAL EXAMINATION:  Physical Exam  GENERAL:  88 y.o.-year-old Caucasian female patient lying in the bed with no acute distress.  EYES: Pupils equal, round, reactive to light and accommodation. No scleral icterus. Extraocular muscles intact.  HEENT: Head atraumatic, normocephalic. Oropharynx and nasopharynx clear.  NECK:  Supple, no jugular venous distention. No thyroid enlargement, no tenderness.  LUNGS: Normal breath sounds bilaterally, no wheezing, rales,rhonchi or crepitation. No use of accessory muscles of respiration.  CARDIOVASCULAR: Regular rate and rhythm, S1, S2 normal. No murmurs, rubs, or gallops.  ABDOMEN: Soft, nondistended, nontender. Bowel sounds present. No organomegaly or mass.  EXTREMITIES: No pedal edema, cyanosis, or clubbing.  NEUROLOGIC: Cranial nerves II through XII are intact. Muscle strength 5/5 in all extremities. Sensation intact. Gait not checked.  PSYCHIATRIC: The patient is alert and oriented x 3.  Normal affect and good eye contact. SKIN: Left eyelid stye with minimal purulence with no other lesion, or ulcer.   LABORATORY PANEL:   CBC Recent Labs  Lab 02/05/22 0517  WBC 9.3  HGB 12.0  HCT 35.6*  PLT 322    ------------------------------------------------------------------------------------------------------------------  Chemistries  Recent Labs  Lab 02/05/22 0517  NA 140  K 3.8  CL 105  CO2 25  GLUCOSE 117*  BUN 22  CREATININE 1.12*  CALCIUM 9.5   ------------------------------------------------------------------------------------------------------------------  Cardiac Enzymes No results for input(s): "TROPONINI" in the last 168 hours. ------------------------------------------------------------------------------------------------------------------  RADIOLOGY:  DG Chest Portable 1 View  Result Date: 02/05/2022 CLINICAL DATA:  Syncope EXAM: PORTABLE CHEST 1 VIEW COMPARISON:  Radiographs 12/14/2020 FINDINGS: Stable cardiomediastinal silhouette. Coronary stenting. Aortic atherosclerotic calcification. No focal consolidation, pleural effusion, or pneumothorax. No acute osseous abnormality. IMPRESSION: No active disease. Electronically Signed   By: Placido Sou M.D.   On: 02/05/2022 03:24   CT HEAD WO CONTRAST  Result Date: 02/04/2022 CLINICAL DATA:  Syncope. EXAM: CT HEAD WITHOUT CONTRAST TECHNIQUE: Contiguous axial images were obtained from the base of the skull through the vertex without intravenous contrast. RADIATION DOSE REDUCTION: This exam was performed according to the departmental dose-optimization program which includes automated exposure control, adjustment of the mA and/or kV according to patient size and/or use of iterative reconstruction technique. COMPARISON:  October 02, 2021 FINDINGS: Brain: There is moderate severity cerebral atrophy with widening of the extra-axial spaces and ventricular dilatation. There are areas of decreased attenuation within the white matter tracts of the supratentorial brain, consistent with microvascular disease changes. Vascular: There is marked severity calcification of the bilateral cavernous carotid arteries. Skull: Normal. Negative for  fracture or focal lesion. Sinuses/Orbits: No acute finding. Other: None. IMPRESSION: 1. No acute intracranial abnormality. 2. Generalized cerebral atrophy and microvascular disease changes of the supratentorial brain. Electronically Signed   By: Virgina Norfolk M.D.   On: 02/04/2022 20:00      IMPRESSION AND PLAN:  Assessment and Plan: * Syncope The patient will be admitted to an observation medical telemetry bed. - The patient has a new onset of left bundle branch block. - Serial troponins came back negative. - We will follow orthostatics. - The patient will be hydrated with IV normal saline. - She will be monitored for arrhythmias. - 2D echo and bilateral carotid Doppler will be obtained. - Differential diagnoses would include neurally mediated, cardiogenic, arrhythmia related hypoglycemic.  Coronary artery disease without angina pectoris - We will continue Plavix, statin therapy, aspirin and Imdur.  Type 2 diabetes mellitus without complications ([place]) - The patient will be placed on supplement coverage with NovoLog. - We will continue her diabetic regimen and monitor for hypoglycemia.  Hyperlipidemia, mixed - We will continue statin therapy.  DVT prophylaxis: Lovenox.  Advanced Care Planning:  Code Status: full code.  Family Communication:  The plan of care was discussed in details with the patient (and family). I answered all questions. The patient agreed to proceed with the above mentioned plan. Further management will depend upon hospital course. Disposition Plan: Back to previous home environment Consults called: none.  All the records are reviewed and case discussed with ED provider.  Status is: Observation  I certify that at the time of admission, it is my clinical judgment that the patient will require  hospital care extending less than 2 midnights.                            Dispo: The patient is from: Home              Anticipated d/c is to: Home               Patient currently is not medically stable to d/c.              Difficult to place patient: No  Hannah Beat M.D on 02/05/2022 at 6:57 AM  [facility]   From 7 PM-7 AM, contact night-coverage [email or web address]  CC: Primary care physician; Danella Penton, MD

## 2022-02-05 NOTE — ED Provider Notes (Signed)
[facility] Provider Note    Event Date/Time   First MD Initiated Contact with Patient 02/05/22 0155     (approximate)   History   Loss of Consciousness   HPI  [name] is a 88 y.o. female who reports she just finished a bath and was sitting down and felt very lightheaded was calling for her son.  She did not think she passed out but her son who is with her reports that she was out for at least a couple minutes and possibly as much is 5.  There was no obvious shaking witnessed.  She denies remembering any chest pain or tightness or palpitations.  She did not remember any shortness of breath.  Nothing is hurting her currently except for some low back pain which she attributes to sitting in the wheelchair for 5 hours.      Physical Exam   Triage Vital Signs: ED Triage Vitals  Enc Vitals Group     BP 02/04/22 1906 (!) 172/60     Pulse Rate 02/04/22 1906 88     Resp 02/04/22 1906 18     Temp 02/04/22 1906 98.2 F (36.8 C)     Temp Source 02/04/22 2052 Oral     SpO2 02/04/22 1906 100 %     Weight 02/04/22 1904 130 lb (59 kg)     Height --      Head Circumference --      Peak Flow --      Pain Score 02/04/22 1904 0     Pain Loc --      Pain Edu? --      Excl. in GC? --     Most recent vital signs: Vitals:   02/05/22 0045 02/05/22 0200  BP: (!) 185/64 (!) 176/55  Pulse: 79 82  Resp: 17 16  Temp: 98.1 F (36.7 C)   SpO2: 97% 100%     General: Awake, no distress.  CV:  Good peripheral perfusion.  Heart regular rate and rhythm no audible murmurs Resp:  Normal effort.  Lungs are clear Abd:  No distention.  Soft nontender no organomegaly Extremities: No edema   ED Results / Procedures / Treatments   Labs (all labs ordered are listed, but only abnormal results are displayed) Labs Reviewed  BASIC METABOLIC PANEL - Abnormal; Notable for the following components:      Result Value   Glucose, Bld 154 (*)    BUN 26 (*)    Creatinine,  Ser 1.17 (*)    GFR, Estimated 45 (*)    All other components within normal limits  CBC - Abnormal; Notable for the following components:   WBC 12.8 (*)    All other components within normal limits  URINALYSIS, ROUTINE W REFLEX MICROSCOPIC - Abnormal; Notable for the following components:   Color, Urine STRAW (*)    APPearance CLEAR (*)    All other components within normal limits  CBG MONITORING, ED - Abnormal; Notable for the following components:   Glucose-Capillary 142 (*)    All other components within normal limits  TROPONIN I (HIGH SENSITIVITY)  TROPONIN I (HIGH SENSITIVITY)     EKG  Read and interpreted by me shows normal sinus rhythm rate of 87 left axis left bundle branch block this is new since August of last year.   RADIOLOGY CT of the head read by radiology reviewed and interpreted by me shows no acute disease  PROCEDURES:  Critical Care performed:  Procedures   MEDICATIONS ORDERED IN ED: Medications - No data to display   IMPRESSION / MDM / [place] / ED COURSE  I reviewed the triage vital signs and the nursing notes. ----------------------------------------- 2:50 AM on 02/05/2022 ----------------------------------------- Patient is not having any symptoms currently except for some low back pain.  Her left bundle branch block is new since last year.  She has a little bit of AKI as well.  This is also compared to last year.  H&H is stable.  Opponent was negative.  Differential diagnosis includes, but is not limited to, vasovagal syncope, syncope due to interference with cerebral blood flow from her known atherosclerosis in the neck vessels, arrhythmia, ischemic heart problems due to her known atherosclerosis there.  Patient's presentation is most consistent with acute presentation with potential threat to life or bodily function.  The patient is on the cardiac monitor to evaluate for evidence of arrhythmia and/or significant heart rate changes.   None have been seen so far   Patient's blood pressure is elevated.  We had 2 readings 277 99 systolic and 1 of 824 systolic but repeat after repositioning of the cuff was 187 which is still very high but much lower comparatively speaking.  Because of not sure the etiology of her syncope right now I will not treat this immediately.  FINAL CLINICAL IMPRESSION(S) / ED DIAGNOSES   Final diagnoses:  Syncope and collapse  Left bundle branch block  Uncontrolled hypertension     Rx / DC Orders   ED Discharge Orders     None        Note:  This document was prepared using Dragon voice recognition software and may include unintentional dictation errors.   Nena Polio, MD 02/05/22 [id number]

## 2022-02-05 NOTE — Assessment & Plan Note (Addendum)
The patient will be admitted to an observation medical telemetry bed. - The patient has a new onset of left bundle branch block. - Serial troponins came back negative. - We will follow orthostatics. - The patient will be hydrated with IV normal saline. - She will be monitored for arrhythmias. - 2D echo and bilateral carotid Doppler will be obtained. - Differential diagnoses would include neurally mediated, cardiogenic, arrhythmia related hypoglycemic.

## 2022-02-06 ENCOUNTER — Observation Stay
Admit: 2022-02-06 | Discharge: 2022-02-06 | Disposition: A | Discharge status: Home / Self Care | Payer: Medicare Other | Attending: Family Medicine | Admitting: Family Medicine

## 2022-02-06 DIAGNOSIS — R55 Syncope and collapse: Secondary | ICD-10-CM | POA: Diagnosis not present

## 2022-02-06 DIAGNOSIS — I2510 Atherosclerotic heart disease of native coronary artery without angina pectoris: Secondary | ICD-10-CM | POA: Diagnosis not present

## 2022-02-06 DIAGNOSIS — E782 Mixed hyperlipidemia: Secondary | ICD-10-CM | POA: Diagnosis not present

## 2022-02-06 DIAGNOSIS — E119 Type 2 diabetes mellitus without complications: Secondary | ICD-10-CM | POA: Diagnosis not present

## 2022-02-06 LAB — ECHOCARDIOGRAM COMPLETE
AR max vel: 1.7 cm2
AV Area VTI: 1.68 cm2
AV Area mean vel: 1.65 cm2
AV Mean grad: 6 mmHg
AV Peak grad: 10.5 mmHg
Ao pk vel: 1.62 m/s
Area-P 1/2: 2.87 cm2
S' Lateral: 2 cm

## 2022-02-06 LAB — CBG MONITORING, ED: Glucose-Capillary: 124 mg/dL — ABNORMAL HIGH (ref 70–99)

## 2022-02-06 NOTE — ED Notes (Signed)
Pt used call light appropriately and asked to use toilet. Pt said she had to have a BM. Bedside commode was placed in pt's room and pt was assisted to commode. Pt's linens adjusted and any extra removed from bed.

## 2022-02-06 NOTE — Progress Notes (Signed)
*  PRELIMINARY RESULTS* Echocardiogram 2D Echocardiogram has been performed.  [name] 02/06/2022, 10:03 AM

## 2022-02-06 NOTE — ED Notes (Signed)
Pt was able to produce urine and 2 very small round BM. Pt said she felt constipated, PRN MOM given to pt. Pt settled back in bed, purewick and brief put in place.

## 2022-02-06 NOTE — Discharge Summary (Signed)
Physician Discharge Summary   Patient: [name] MRN: [id number]  DOB: Nov 04, 1930   Admit:     Date of Admission: 02/05/2022 Admitted from: home   Discharge: Date of discharge: 02/06/22 Disposition: Home Condition at discharge: good  CODE STATUS: FULL CODE     Discharge Physician: Emeterio Reeve, DO [facility]     PCP: Rusty Aus, MD    Discharge Diagnoses: Principal Problem:   Syncope Active Problems:   Hyperlipidemia, mixed   Type 2 diabetes mellitus without complications ([place])   Coronary artery disease without angina pectoris     Recommendations for Outpatient Follow-up:  Follow up with PCP Rusty Aus, MD in 2-4 weeks Follow up as below w/ vascular surgery and cardiology  Please follow up on the following pending results: cardiac event monitor  PCP AND OTHER OUTPATIENT PROVIDERS: SEE BELOW FOR SPECIFIC DISCHARGE INSTRUCTIONS PRINTED FOR PATIENT IN ADDITION TO GENERIC AVS PATIENT INFO  Discharge Instructions     Diet - low sodium heart healthy   Complete by: As directed    Discharge instructions   Complete by: As directed    Follow up with cardiology office Monday 02/09/22 at 9:00 to have cardiac monitor placed. Schedule follow up appointment in 3 months with Dr Bunnie Domino office (vascular surgery) to follow up on carotid stenosis.   Increase activity slowly   Complete by: As directed           Hospital Course: [name] is a 88 y.o. Caucasian female with medical history significant for hypertension and dyslipidemia as well as coronary artery disease status post PCI and stent, who presented to the emergency room with acute onset of syncope while sitting down after having a bath.  She was apparently unconscious for about 2 minutes 10/18-10/19: in ED BP was 172/60 with otherwise normal vital signs.  Labs revealed BUN of 26 creatinine 1.17 and htroponin was 10 and later 11.  CBC leukocytosis of 12.8.EKG showed normal sinus rhythm  with possible left atrial enlargement and left bundle blanch block of new onset with a rate of 87. Noncontrast head CT showed generalized cerebral atrophy and microvascular changes of the supratentorial brain with no acute intracranial abnormality.  Admitted to hospitalist service under observation status.  Orthostatics, telemetry, 2D echo, carotid Doppler.  Obtain orthostatic VS. 10/19: vascular surgery re: carotid stenosis --> continue antiplatelet and statin agent.  Follow-up in our office for continued surveillance. Cardiac issues or arrhythmias are more likely to be the cause of syncope.  10/20: echo done today LVEF 55-60 w/ normal LV fxn, mild LVH, G1 DD. Spoke w/ cardiology NP, pt can come get cardiac monitor placed today or on Monday in the office.   Consultants:  Vascular surgery   Procedures: None       ASSESSMENT & PLAN:   Principal Problem:   Syncope Active Problems:   Hyperlipidemia, mixed   Type 2 diabetes mellitus without complications (HCC)   Coronary artery disease without angina pectoris   Syncope Uncertain cause Cardiac event monitor pending outpatient follow up   Type 2 diabetes mellitus without complications ([place]) Continue outpatient follow up   Coronary artery disease without angina pectoris Carotid stenosis  Hyperlipidemia  continue Plavix, statin therapy, aspirin and Imdur.              Discharge Instructions  Allergies as of 02/06/2022       Reactions   Butenafine Swelling   Tongue swelled and turned red  Levofloxacin Swelling, Other (See Comments)   Other reaction(s): Other (See Comments) Tongue swelled and turned red   Limonene Other (See Comments)   Other reaction(s): Other (See Comments) Tongue swelled and turned red Tongue swelled and turned red   Penicillins    Other reaction(s): Unknown   Terbinafine And Related Other (See Comments)   Tongue swelled and turned red        Medication List     STOP taking these  medications    B12-Active 1 MG Chew Generic drug: Methylcobalamin   cephALEXin 250 MG capsule Commonly known as: Keflex   lidocaine 5 % Commonly known as: LIDODERM   neomycin-polymyxin b-dexamethasone 3.5-10000-0.1 Oint Commonly known as: MAXITROL   nitrofurantoin 50 MG capsule Commonly known as: MACRODANTIN       TAKE these medications    amLODipine 10 MG tablet Commonly known as: NORVASC Take 1 tablet (10 mg total) by mouth daily.   aspirin EC 81 MG tablet Take 1 tablet (81 mg total) by mouth daily. Swallow whole.   carboxymethylcellulose 0.5 % Soln Commonly known as: REFRESH PLUS 1 drop daily as needed.   cetirizine 5 MG tablet Commonly known as: ZYRTEC Take 5 mg by mouth daily.   clopidogrel 75 MG tablet Commonly known as: PLAVIX Take 75 mg by mouth daily.   Cranberry 400 MG Caps Take 1 capsule by mouth 2 (two) times daily.   diphenhydrAMINE 25 MG tablet Commonly known as: BENADRYL   docusate sodium 100 MG capsule Commonly known as: COLACE Take 2 capsules (200 mg total) by mouth 2 (two) times daily.   donepezil 10 MG tablet Commonly known as: ARICEPT Take 10 mg by mouth daily.   fluticasone 50 MCG/ACT nasal spray Commonly known as: FLONASE Place 1 spray into both nostrils daily.   isosorbide mononitrate 60 MG 24 hr tablet Commonly known as: IMDUR Take 1 tablet (60 mg total) by mouth daily.   magnesium oxide 400 MG tablet Commonly known as: MAG-OX Take 400 mg by mouth daily.   MULTIVITAMIN/IRON PO Take 1 tablet by mouth daily.   nitroGLYCERIN 0.4 MG SL tablet Commonly known as: NITROSTAT Place 0.4 mg under the tongue every 5 (five) minutes as needed. For chest pain   omeprazole 20 MG capsule Commonly known as: PRILOSEC Take 20 mg by mouth daily.   polyethylene glycol 17 g packet Commonly known as: MIRALAX / GLYCOLAX Take 17 g by mouth daily.   Pro-Stat Liqd Take 30 mLs by mouth 2 (two) times daily.   rosuvastatin 5 MG  tablet Commonly known as: CRESTOR Take 5 mg by mouth daily. What changed: Another medication with the same name was removed. Continue taking this medication, and follow the directions you see here.   timolol 0.5 % ophthalmic solution Commonly known as: TIMOPTIC Place 1 drop into both eyes at bedtime.   tizanidine 2 MG capsule Commonly known as: ZANAFLEX Take 2 mg by mouth 2 (two) times daily as needed for muscle spasms.   torsemide 20 MG tablet Commonly known as: DEMADEX   vitamin B-12 100 MCG tablet Commonly known as: CYANOCOBALAMIN Take 100 mcg by mouth daily.         Follow-up Information     Schnier, Dolores Lory, MD Follow up in 3 month(s).   Specialties: Vascular Surgery, Cardiology, Radiology, Vascular Surgery Why: with carotid duplex Contact information: [place] [address] [phone]         Isaias Cowman, MD. Go on 02/09/2022.  Specialty: Cardiology Why: 9:00 AM for cardiac monitor placement Contact information: [facility] [place] [address] [phone]                 Allergies  Allergen Reactions   Butenafine Swelling    Tongue swelled and turned red   Levofloxacin Swelling and Other (See Comments)    Other reaction(s): Other (See Comments) Tongue swelled and turned red   Limonene Other (See Comments)    Other reaction(s): Other (See Comments) Tongue swelled and turned red Tongue swelled and turned red    Penicillins     Other reaction(s): Unknown   Terbinafine And Related Other (See Comments)    Tongue swelled and turned red     Subjective: pt feeling well this morning, no concerns, no additional episodes lightheadedness. No chest pain    Discharge Exam: BP (!) 140/46 (BP Location: Left Arm)   Pulse 60   Temp 98.5 F (36.9 C) (Oral)   Resp 16   Wt 59 kg   SpO2 98%   BMI 22.31 kg/m  General: Pt is alert, awake, not in acute distress Cardiovascular:  RRR, S1/S2 +, no rubs, no gallops Respiratory: CTA bilaterally, no wheezing, no rhonchi Abdominal: Soft, NT, ND, bowel sounds + Extremities: no edema, no cyanosis     The results of significant diagnostics from this hospitalization (including imaging, microbiology, ancillary and laboratory) are listed below for reference.     Microbiology: No results found for this or any previous visit (from the past 240 hour(s)).   Labs: BNP (last 3 results) No results for input(s): "BNP" in the last 8760 hours. Basic Metabolic Panel: Recent Labs  Lab 02/04/22 1915 02/05/22 0517  NA 136 140  K 4.1 3.8  CL 100 105  CO2 26 25  GLUCOSE 154* 117*  BUN 26* 22  CREATININE 1.17* 1.12*  CALCIUM 9.7 9.5   Liver Function Tests: No results for input(s): "AST", "ALT", "ALKPHOS", "BILITOT", "PROT", "ALBUMIN" in the last 168 hours. No results for input(s): "LIPASE", "AMYLASE" in the last 168 hours. No results for input(s): "AMMONIA" in the last 168 hours. CBC: Recent Labs  Lab 02/04/22 1915 02/05/22 0517  WBC 12.8* 9.3  HGB 12.7 12.0  HCT 37.3 35.6*  MCV 94.2 94.7  PLT 338 322   Cardiac Enzymes: No results for input(s): "CKTOTAL", "CKMB", "CKMBINDEX", "TROPONINI" in the last 168 hours. BNP: Invalid input(s): "POCBNP" CBG: Recent Labs  Lab 02/05/22 0514 02/05/22 0755 02/05/22 1201 02/05/22 1656 02/06/22 0814  GLUCAP 113* 133* 123* 144* 124*   D-Dimer No results for input(s): "DDIMER" in the last 72 hours. Hgb A1c Recent Labs    02/05/22 0517  HGBA1C 6.5*   Lipid Profile No results for input(s): "CHOL", "HDL", "LDLCALC", "TRIG", "CHOLHDL", "LDLDIRECT" in the last 72 hours. Thyroid function studies No results for input(s): "TSH", "T4TOTAL", "T3FREE", "THYROIDAB" in the last 72 hours.  Invalid input(s): "FREET3" Anemia work up No results for input(s): "VITAMINB12", "FOLATE", "FERRITIN", "TIBC", "IRON", "RETICCTPCT" in the last 72 hours. Urinalysis    Component Value  Date/Time   COLORURINE STRAW (A) 02/04/2022 2214   APPEARANCEUR CLEAR (A) 02/04/2022 2214   APPEARANCEUR CLEAR 08/15/2012 1031   LABSPEC 1.010 02/04/2022 2214   LABSPEC 1.015 08/15/2012 1031   PHURINE 7.0 02/04/2022 2214   GLUCOSEU NEGATIVE 02/04/2022 2214   GLUCOSEU 300 mg/dL 08/15/2012 1031   [place] 02/04/2022 2214   [place] 02/04/2022 2214   [place] NEGATIVE 08/15/2012 1031  KETONESUR NEGATIVE 02/04/2022 2214   PROTEINUR NEGATIVE 02/04/2022 2214   NITRITE NEGATIVE 02/04/2022 2214   LEUKOCYTESUR NEGATIVE 02/04/2022 2214   LEUKOCYTESUR NEGATIVE 08/15/2012 1031   Sepsis Labs Recent Labs  Lab 02/04/22 1915 02/05/22 0517  WBC 12.8* 9.3   Microbiology No results found for this or any previous visit (from the past 240 hour(s)). Imaging ECHOCARDIOGRAM COMPLETE  Result Date: 02/06/2022    ECHOCARDIOGRAM REPORT   Patient Name:   [name] Date of Exam: 02/06/2022 Medical Rec #:  [id number]     Height:       64.0 in Accession #:    [id number]    Weight:       130.0 lb Date of Birth:  1930-05-27     BSA:          1.629 m Patient Age:    7 years      BP:           135/44 mmHg Patient Gender: F             HR:           67 bpm. Exam Location:  [facility] Procedure: 2D Echo, Color Doppler and Cardiac Doppler Indications:     R55 Syncope  History:         Patient has prior history of Echocardiogram examinations, most                  recent 12/11/2020. Previous Myocardial Infarction; Risk                  Factors:Hypertension and HCL.  Sonographer:     Charmayne Sheer Referring Phys:  [id number] JAN A [place] Diagnosing Phys: Neoma Laming  Sonographer Comments: Suboptimal subcostal window. IMPRESSIONS  1. Left ventricular ejection fraction, by estimation, is 55 to 60%. The left ventricle has normal function. The left ventricle has no regional wall motion abnormalities. There is mild left ventricular hypertrophy. Left ventricular diastolic parameters are consistent with Grade I  diastolic dysfunction (impaired relaxation).  2. Right ventricular systolic function is normal. The right ventricular size is normal.  3. Left atrial size was mildly dilated.  4. Right atrial size was mildly dilated.  5. The mitral valve is degenerative. Trivial mitral valve regurgitation. Severe mitral annular calcification.  6. The aortic valve is calcified. Aortic valve regurgitation is mild. Aortic valve sclerosis/calcification is present, without any evidence of aortic stenosis. FINDINGS  Left Ventricle: Left ventricular ejection fraction, by estimation, is 55 to 60%. The left ventricle has normal function. The left ventricle has no regional wall motion abnormalities. The left ventricular internal cavity size was normal in size. There is  mild left ventricular hypertrophy. Left ventricular diastolic parameters are consistent with Grade I diastolic dysfunction (impaired relaxation). Right Ventricle: The right ventricular size is normal. No increase in right ventricular wall thickness. Right ventricular systolic function is normal. Left Atrium: Left atrial size was mildly dilated. Right Atrium: Right atrial size was mildly dilated. Pericardium: There is no evidence of pericardial effusion. Mitral Valve: The mitral valve is degenerative in appearance. There is moderate thickening of the mitral valve leaflet(s). Severe mitral annular calcification. Trivial mitral valve regurgitation. Tricuspid Valve: The tricuspid valve is grossly normal. Tricuspid valve regurgitation is mild. Aortic Valve: The aortic valve is calcified. Aortic valve regurgitation is mild. Aortic valve sclerosis/calcification is present, without any evidence of aortic stenosis. Aortic valve mean gradient measures 6.0 mmHg. Aortic valve peak gradient measures 10.5 mmHg. Aortic valve area,  by VTI measures 1.68 cm. Pulmonic Valve: The pulmonic valve was grossly normal. Pulmonic valve regurgitation is trivial. Aorta: The aortic root, ascending aorta  and aortic arch are all structurally normal, with no evidence of dilitation or obstruction. IAS/Shunts: No atrial level shunt detected by color flow Doppler.  LEFT VENTRICLE PLAX 2D LVIDd:         3.40 cm   Diastology LVIDs:         2.00 cm   LV e' medial:    5.44 cm/s LV PW:         1.20 cm   LV E/e' medial:  15.5 LV IVS:        1.10 cm   LV e' lateral:   6.53 cm/s LVOT diam:     1.70 cm   LV E/e' lateral: 12.9 LV SV:         64 LV SV Index:   40 LVOT Area:     2.27 cm  RIGHT VENTRICLE RV Basal diam:  3.40 cm RV S prime:     12.90 cm/s LEFT ATRIUM             Index        RIGHT ATRIUM           Index LA diam:        3.40 cm 2.09 cm/m   RA Area:     10.30 cm LA Vol (A2C):   38.6 ml 23.69 ml/m  RA Volume:   20.40 ml  12.52 ml/m LA Vol (A4C):   36.8 ml 22.59 ml/m LA Biplane Vol: 39.9 ml 24.49 ml/m  AORTIC VALVE                     PULMONIC VALVE AV Area (Vmax):    1.70 cm      PV Vmax:          1.14 m/s AV Area (Vmean):   1.65 cm      PV Vmean:         71.500 cm/s AV Area (VTI):     1.68 cm      PV VTI:           0.224 m AV Vmax:           162.00 cm/s   PV Peak grad:     5.2 mmHg AV Vmean:          114.000 cm/s  PV Mean grad:     3.0 mmHg AV VTI:            0.384 m       PR End Diast Vel: 5.95 msec AV Peak Grad:      10.5 mmHg AV Mean Grad:      6.0 mmHg LVOT Vmax:         121.00 cm/s LVOT Vmean:        82.800 cm/s LVOT VTI:          0.284 m LVOT/AV VTI ratio: 0.74  AORTA Ao Root diam: 3.10 cm MITRAL VALVE MV Area (PHT): 2.87 cm     SHUNTS MV Decel Time: 264 msec     Systemic VTI:  0.28 m MV E velocity: 84.40 cm/s   Systemic Diam: 1.70 cm MV A velocity: 147.00 cm/s MV E/A ratio:  0.57 Shaukat Khan Electronically signed by Neoma Laming Signature Date/Time: 02/06/2022/11:46:58 AM    Final       Time coordinating discharge: over 30 minutes  SIGNED:  Emeterio Reeve DO [facility]

## 2022-03-15 NOTE — Progress Notes (Signed)
MRN : [id number]  [name] is a 88 y.o. ([birth year]) female who presents with chief complaint of check carotid arteries.  History of Present Illness:   The patient is seen for follow up evaluation of carotid stenosis. The carotid stenosis followed by ultrasound.  She was hospitalized in mid October 2023 with syncope, no focal symptoms, carotid ultrasound was unchanged compared to previous study.   The patient denies amaurosis fugax. There is no recent history of TIA symptoms or focal motor deficits. There is no prior documented CVA.   The patient is taking enteric-coated aspirin 81 mg daily.   There is no history of migraine headaches. There is no history of seizures.   The patient has a history of coronary artery disease, no recent episodes of angina or shortness of breath. The patient denies PAD or claudication symptoms. There is a history of hyperlipidemia which is being treated with a statin.     Carotid Duplex done today shows 50-69% RICA and 40-59% LICA.  No change compared to last study     No outpatient medications have been marked as taking for the 03/16/22 encounter (Appointment) with Gilda Crease, Latina Craver, MD.    Past Medical History:  Diagnosis Date   Hypercholesteremia    Hypertension    Myocardial infarction (HCC) 2008   S/P angioplasty with stent     Past Surgical History:  Procedure Laterality Date   ABDOMINAL HYSTERECTOMY     APPENDECTOMY     CORONARY ANGIOPLASTY WITH STENT PLACEMENT     CORONARY STENT INTERVENTION N/A 12/12/2019   Procedure: CORONARY STENT INTERVENTION;  Surgeon: Marcina Millard, MD;  Location: [facility] INVASIVE CV LAB;  Service: Cardiovascular;  Laterality: N/A;   LEFT HEART CATH AND CORONARY ANGIOGRAPHY N/A 12/12/2019   Procedure: LEFT HEART CATH AND CORONARY ANGIOGRAPHY with possible PCI;  Surgeon: Marcina Millard, MD;  Location: [facility] INVASIVE CV LAB;  Service: Cardiovascular;  Laterality: N/A;    Social History Social  History   Tobacco Use   Smoking status: Never   Smokeless tobacco: Never  Vaping Use   Vaping Use: Never used  Substance Use Topics   Alcohol use: No   Drug use: No    Family History Family History  Problem Relation Age of Onset   Cancer Mother    Heart disease Father    Breast cancer Maternal Aunt     Allergies  Allergen Reactions   Butenafine Swelling    Tongue swelled and turned red   Levofloxacin Swelling and Other (See Comments)    Other reaction(s): Other (See Comments) Tongue swelled and turned red   Limonene Other (See Comments)    Other reaction(s): Other (See Comments) Tongue swelled and turned red Tongue swelled and turned red    Penicillins     Other reaction(s): Unknown   Terbinafine And Related Other (See Comments)    Tongue swelled and turned red     REVIEW OF SYSTEMS (Negative unless checked)  Constitutional: [] Weight loss  [] Fever  [] Chills Cardiac: [] Chest pain   [] Chest pressure   [] Palpitations   [] Shortness of breath when laying flat   [] Shortness of breath with exertion. Vascular:  [x] Pain in legs with walking   [] Pain in legs at rest  [] History of DVT   [] Phlebitis   [] Swelling in legs   [] Varicose veins   [] Non-healing ulcers Pulmonary:   [] Uses home oxygen   [] Productive cough   [] Hemoptysis   [] Wheeze  [] COPD   [] Asthma Neurologic:  [  x]Dizziness   [] Seizures   [] History of stroke   [] History of TIA  [] Aphasia   [] Vissual changes   [] Weakness or numbness in arm   [x] Weakness or numbness in leg Musculoskeletal:   [] Joint swelling   [] Joint pain   [x] Low back pain Hematologic:  [] Easy bruising  [] Easy bleeding   [] Hypercoagulable state   [] Anemic Gastrointestinal:  [] Diarrhea   [] Vomiting  [x] Gastroesophageal reflux/heartburn   [] Difficulty swallowing. Genitourinary:  [] Chronic kidney disease   [] Difficult urination  [] Frequent urination   [] Blood in urine Skin:  [] Rashes   [] Ulcers  Psychological:  [] History of anxiety   []  History of major  depression.  Physical Examination  There were no vitals filed for this visit. There is no height or weight on file to calculate BMI. Gen: WD/WN, NAD Head: [place]/AT, No temporalis wasting.  Ear/Nose/Throat: Hearing grossly intact, nares w/o erythema or drainage Eyes: PER, EOMI, sclera nonicteric.  Neck: Supple, no masses.  No bruit or JVD.  Pulmonary:  Good air movement, no audible wheezing, no use of accessory muscles.  Cardiac: RRR, normal S1, S2, no Murmurs. Vascular:  carotid bruit noted Vessel Right Left  Radial Palpable Palpable  Carotid  Palpable  Palpable  Subclav  Palpable Palpable  Gastrointestinal: soft, non-distended. No guarding/no peritoneal signs.  Musculoskeletal: M/S 5/5 throughout.  No visible deformity.  Neurologic: CN 2-12 intact. Pain and light touch intact in extremities.  Symmetrical.  Speech is fluent. Motor exam as listed above. Psychiatric: Judgment intact, Mood & affect appropriate for pt's clinical situation. Dermatologic: No rashes or ulcers noted.  No changes consistent with cellulitis.   CBC Lab Results  Component Value Date   WBC 9.3 02/05/2022   HGB 12.0 02/05/2022   HCT 35.6 (L) 02/05/2022   MCV 94.7 02/05/2022   PLT 322 02/05/2022    BMET    Component Value Date/Time   NA 140 02/05/2022 0517   NA 139 08/16/2012 0255   K 3.8 02/05/2022 0517   K 3.9 08/16/2012 0255   CL 105 02/05/2022 0517   CL 108 (H) 08/16/2012 0255   CO2 25 02/05/2022 0517   CO2 25 08/16/2012 0255   GLUCOSE 117 (H) 02/05/2022 0517   GLUCOSE 129 (H) 08/16/2012 0255   BUN 22 02/05/2022 0517   BUN 21 (H) 08/16/2012 0255   CREATININE 1.12 (H) 02/05/2022 0517   CREATININE 1.43 (H) 08/16/2012 0255   CALCIUM 9.5 02/05/2022 0517   CALCIUM 8.5 08/16/2012 0255   GFRNONAA 47 (L) 02/05/2022 0517   GFRNONAA 35 (L) 08/16/2012 0255   GFRAA 46 (L) 12/13/2019 0519   GFRAA 40 (L) 08/16/2012 0255   CrCl cannot be calculated (Patient's most recent lab result is older than the  maximum 21 days allowed.).  COAG Lab Results  Component Value Date   INR 0.9 08/26/2020   INR 0.8 12/11/2019   INR 1.0 11/17/2019    Radiology No results found.   Assessment/Plan 1. Bilateral carotid artery stenosis Recommend:   Given the patient's asymptomatic subcritical stenosis no further invasive testing or surgery at this time.   Duplex ultrasound shows 40-69% stenosis bilaterally.   Continue antiplatelet therapy as prescribed Continue management of CAD, HTN and Hyperlipidemia Healthy heart diet,  encouraged exercise at least 4 times per week Follow up in 12 months with duplex ultrasound and physical exam.  - VAS 02/07/2022 CAROTID; Future  2. Essential hypertension Continue antihypertensive medications as already ordered, these medications have been reviewed and there are no changes at this  time.  3. Coronary artery disease of native artery of native heart with stable angina pectoris (HCC) Continue cardiac and antihypertensive medications as already ordered and reviewed, no changes at this time.  Continue statin as ordered and reviewed, no changes at this time  Nitrates PRN for chest pain  4. Type 2 diabetes mellitus without complication, without long-term current use of insulin (HCC) Continue hypoglycemic medications as already ordered, these medications have been reviewed and there are no changes at this time.  Hgb A1C to be monitored as already arranged by primary service  5. Mixed hyperlipidemia Continue statin as ordered and reviewed, no changes at this time    Levora Dredge, MD  03/15/2022 4:36 PM

## 2022-03-16 ENCOUNTER — Encounter (INDEPENDENT_AMBULATORY_CARE_PROVIDER_SITE_OTHER): Payer: Self-pay | Admitting: Vascular Surgery

## 2022-03-16 ENCOUNTER — Ambulatory Visit (INDEPENDENT_AMBULATORY_CARE_PROVIDER_SITE_OTHER): Payer: Medicare Other | Admitting: Vascular Surgery

## 2022-03-16 ENCOUNTER — Ambulatory Visit (INDEPENDENT_AMBULATORY_CARE_PROVIDER_SITE_OTHER): Payer: Medicare Other

## 2022-03-16 VITALS — BP 186/73 | HR 66 | Resp 15 | Wt 138.4 lb

## 2022-03-16 DIAGNOSIS — I25118 Atherosclerotic heart disease of native coronary artery with other forms of angina pectoris: Secondary | ICD-10-CM

## 2022-03-16 DIAGNOSIS — I6523 Occlusion and stenosis of bilateral carotid arteries: Secondary | ICD-10-CM

## 2022-03-16 DIAGNOSIS — I10 Essential (primary) hypertension: Secondary | ICD-10-CM | POA: Diagnosis not present

## 2022-03-16 DIAGNOSIS — E119 Type 2 diabetes mellitus without complications: Secondary | ICD-10-CM

## 2022-03-16 DIAGNOSIS — E782 Mixed hyperlipidemia: Secondary | ICD-10-CM

## 2022-03-17 ENCOUNTER — Encounter (INDEPENDENT_AMBULATORY_CARE_PROVIDER_SITE_OTHER): Payer: Self-pay | Admitting: Vascular Surgery

## 2022-04-14 ENCOUNTER — Emergency Department: Payer: Medicare Other

## 2022-04-14 ENCOUNTER — Inpatient Hospital Stay
Admission: EM | Admit: 2022-04-14 | Discharge: 2022-04-17 | DRG: 194 | Disposition: A | Discharge status: Home Health | Payer: Medicare Other | Attending: Internal Medicine | Admitting: Internal Medicine

## 2022-04-14 ENCOUNTER — Other Ambulatory Visit: Payer: Self-pay

## 2022-04-14 DIAGNOSIS — Z7902 Long term (current) use of antithrombotics/antiplatelets: Secondary | ICD-10-CM | POA: Diagnosis not present

## 2022-04-14 DIAGNOSIS — R197 Diarrhea, unspecified: Secondary | ICD-10-CM | POA: Diagnosis present

## 2022-04-14 DIAGNOSIS — N179 Acute kidney failure, unspecified: Secondary | ICD-10-CM | POA: Diagnosis present

## 2022-04-14 DIAGNOSIS — K4090 Unilateral inguinal hernia, without obstruction or gangrene, not specified as recurrent: Secondary | ICD-10-CM | POA: Diagnosis present

## 2022-04-14 DIAGNOSIS — Z880 Allergy status to penicillin: Secondary | ICD-10-CM | POA: Diagnosis not present

## 2022-04-14 DIAGNOSIS — Z7982 Long term (current) use of aspirin: Secondary | ICD-10-CM

## 2022-04-14 DIAGNOSIS — J101 Influenza due to other identified influenza virus with other respiratory manifestations: Secondary | ICD-10-CM | POA: Diagnosis present

## 2022-04-14 DIAGNOSIS — E1122 Type 2 diabetes mellitus with diabetic chronic kidney disease: Secondary | ICD-10-CM | POA: Diagnosis present

## 2022-04-14 DIAGNOSIS — Z955 Presence of coronary angioplasty implant and graft: Secondary | ICD-10-CM

## 2022-04-14 DIAGNOSIS — I252 Old myocardial infarction: Secondary | ICD-10-CM

## 2022-04-14 DIAGNOSIS — Z1152 Encounter for screening for COVID-19: Secondary | ICD-10-CM | POA: Diagnosis not present

## 2022-04-14 DIAGNOSIS — I129 Hypertensive chronic kidney disease with stage 1 through stage 4 chronic kidney disease, or unspecified chronic kidney disease: Secondary | ICD-10-CM | POA: Diagnosis present

## 2022-04-14 DIAGNOSIS — I2510 Atherosclerotic heart disease of native coronary artery without angina pectoris: Secondary | ICD-10-CM | POA: Diagnosis present

## 2022-04-14 DIAGNOSIS — E860 Dehydration: Secondary | ICD-10-CM | POA: Diagnosis present

## 2022-04-14 DIAGNOSIS — Z888 Allergy status to other drugs, medicaments and biological substances status: Secondary | ICD-10-CM | POA: Diagnosis not present

## 2022-04-14 DIAGNOSIS — R410 Disorientation, unspecified: Secondary | ICD-10-CM | POA: Diagnosis present

## 2022-04-14 DIAGNOSIS — R531 Weakness: Secondary | ICD-10-CM | POA: Diagnosis not present

## 2022-04-14 DIAGNOSIS — K429 Umbilical hernia without obstruction or gangrene: Secondary | ICD-10-CM | POA: Diagnosis present

## 2022-04-14 DIAGNOSIS — Z8249 Family history of ischemic heart disease and other diseases of the circulatory system: Secondary | ICD-10-CM

## 2022-04-14 DIAGNOSIS — Z803 Family history of malignant neoplasm of breast: Secondary | ICD-10-CM | POA: Diagnosis not present

## 2022-04-14 DIAGNOSIS — E782 Mixed hyperlipidemia: Secondary | ICD-10-CM | POA: Diagnosis present

## 2022-04-14 DIAGNOSIS — N1831 Chronic kidney disease, stage 3a: Secondary | ICD-10-CM | POA: Diagnosis present

## 2022-04-14 LAB — COMPREHENSIVE METABOLIC PANEL
ALT: 14 U/L (ref 0–44)
AST: 27 U/L (ref 15–41)
Albumin: 3.7 g/dL (ref 3.5–5.0)
Alkaline Phosphatase: 74 U/L (ref 38–126)
Anion gap: 11 (ref 5–15)
BUN: 19 mg/dL (ref 8–23)
CO2: 23 mmol/L (ref 22–32)
Calcium: 8.9 mg/dL (ref 8.9–10.3)
Chloride: 100 mmol/L (ref 98–111)
Creatinine, Ser: 1.39 mg/dL — ABNORMAL HIGH (ref 0.44–1.00)
GFR, Estimated: 36 mL/min — ABNORMAL LOW (ref >60)
Glucose, Bld: 164 mg/dL — ABNORMAL HIGH (ref 70–99)
Potassium: 3.8 mmol/L (ref 3.5–5.1)
Sodium: 134 mmol/L — ABNORMAL LOW (ref 135–145)
Total Bilirubin: 1.1 mg/dL (ref 0.3–1.2)
Total Protein: 6.8 g/dL (ref 6.5–8.1)

## 2022-04-14 LAB — URINALYSIS, ROUTINE W REFLEX MICROSCOPIC
Bilirubin Urine: NEGATIVE
Glucose, UA: NEGATIVE mg/dL
Hgb urine dipstick: NEGATIVE
Ketones, ur: NEGATIVE mg/dL
Leukocytes,Ua: NEGATIVE
Nitrite: NEGATIVE
Protein, ur: NEGATIVE mg/dL
Specific Gravity, Urine: 1.008 (ref 1.005–1.030)
pH: 6 (ref 5.0–8.0)

## 2022-04-14 LAB — CBC WITH DIFFERENTIAL/PLATELET
Abs Immature Granulocytes: 0.08 10*3/uL — ABNORMAL HIGH (ref 0.00–0.07)
Basophils Absolute: 0.1 10*3/uL (ref 0.0–0.1)
Basophils Relative: 0 %
Eosinophils Absolute: 0 10*3/uL (ref 0.0–0.5)
Eosinophils Relative: 0 %
HCT: 35.6 % — ABNORMAL LOW (ref 36.0–46.0)
Hemoglobin: 12.1 g/dL (ref 12.0–15.0)
Immature Granulocytes: 1 %
Lymphocytes Relative: 5 %
Lymphs Abs: 0.7 10*3/uL (ref 0.7–4.0)
MCH: 32.3 pg (ref 26.0–34.0)
MCHC: 34 g/dL (ref 30.0–36.0)
MCV: 94.9 fL (ref 80.0–100.0)
Monocytes Absolute: 0.8 10*3/uL (ref 0.1–1.0)
Monocytes Relative: 6 %
Neutro Abs: 12.6 10*3/uL — ABNORMAL HIGH (ref 1.7–7.7)
Neutrophils Relative %: 88 %
Platelets: 280 10*3/uL (ref 150–400)
RBC: 3.75 MIL/uL — ABNORMAL LOW (ref 3.87–5.11)
RDW: 13.6 % (ref 11.5–15.5)
WBC: 14.2 10*3/uL — ABNORMAL HIGH (ref 4.0–10.5)
nRBC: 0 % (ref 0.0–0.2)

## 2022-04-14 LAB — RESP PANEL BY RT-PCR (RSV, FLU A&B, COVID)  RVPGX2
Influenza A by PCR: POSITIVE — AB
Influenza B by PCR: NEGATIVE
Resp Syncytial Virus by PCR: NEGATIVE
SARS Coronavirus 2 by RT PCR: NEGATIVE

## 2022-04-14 LAB — CBG MONITORING, ED: Glucose-Capillary: 140 mg/dL — ABNORMAL HIGH (ref 70–99)

## 2022-04-14 LAB — LIPASE, BLOOD: Lipase: 26 U/L (ref 11–51)

## 2022-04-14 LAB — LACTIC ACID, PLASMA: Lactic Acid, Venous: 1.6 mmol/L (ref 0.5–1.9)

## 2022-04-14 MED ORDER — LORATADINE 10 MG PO TABS
10.0000 mg | ORAL_TABLET | Freq: Every day | ORAL | Status: DC
  Administered 2022-04-15 – 2022-04-17 (×3): 10 mg via ORAL
  Filled 2022-04-14 (×3): qty 1

## 2022-04-14 MED ORDER — CLOPIDOGREL BISULFATE 75 MG PO TABS
75.0000 mg | ORAL_TABLET | Freq: Every day | ORAL | Status: DC
  Administered 2022-04-15 – 2022-04-17 (×3): 75 mg via ORAL
  Filled 2022-04-14 (×3): qty 1

## 2022-04-14 MED ORDER — INSULIN ASPART 100 UNIT/ML IJ SOLN
0.0000 [IU] | Freq: Three times a day (TID) | INTRAMUSCULAR | Status: DC
  Administered 2022-04-16: 2 [IU] via SUBCUTANEOUS
  Administered 2022-04-16: 5 [IU] via SUBCUTANEOUS
  Administered 2022-04-17: 1 [IU] via SUBCUTANEOUS
  Filled 2022-04-14 (×4): qty 1

## 2022-04-14 MED ORDER — ASPIRIN 81 MG PO TBEC
81.0000 mg | DELAYED_RELEASE_TABLET | Freq: Every day | ORAL | Status: DC
  Administered 2022-04-15 – 2022-04-17 (×3): 81 mg via ORAL
  Filled 2022-04-14 (×3): qty 1

## 2022-04-14 MED ORDER — ISOSORBIDE MONONITRATE ER 30 MG PO TB24
60.0000 mg | ORAL_TABLET | Freq: Every day | ORAL | Status: DC
  Administered 2022-04-15 – 2022-04-17 (×3): 60 mg via ORAL
  Filled 2022-04-14 (×2): qty 1
  Filled 2022-04-14: qty 2

## 2022-04-14 MED ORDER — ROSUVASTATIN CALCIUM 5 MG PO TABS
5.0000 mg | ORAL_TABLET | Freq: Every day | ORAL | Status: DC
  Administered 2022-04-15 – 2022-04-17 (×3): 5 mg via ORAL
  Filled 2022-04-14 (×4): qty 1

## 2022-04-14 MED ORDER — POLYVINYL ALCOHOL 1.4 % OP SOLN: 1.0000 [drp] | Freq: Four times a day (QID) | OPHTHALMIC | Status: DC | PRN

## 2022-04-14 MED ORDER — GUAIFENESIN ER 600 MG PO TB12
600.0000 mg | ORAL_TABLET | Freq: Two times a day (BID) | ORAL | Status: DC
  Administered 2022-04-14 – 2022-04-16 (×4): 600 mg via ORAL
  Filled 2022-04-14 (×4): qty 1

## 2022-04-14 MED ORDER — NITROGLYCERIN 0.4 MG SL SUBL: 0.4000 mg | SUBLINGUAL_TABLET | SUBLINGUAL | Status: DC | PRN

## 2022-04-14 MED ORDER — SODIUM CHLORIDE 0.9% FLUSH
3.0000 mL | Freq: Two times a day (BID) | INTRAVENOUS | Status: DC
  Administered 2022-04-14: 3 mL via INTRAVENOUS

## 2022-04-14 MED ORDER — HYDRALAZINE HCL 20 MG/ML IJ SOLN: 5.0000 mg | Freq: Four times a day (QID) | INTRAMUSCULAR | Status: DC | PRN

## 2022-04-14 MED ORDER — PANTOPRAZOLE SODIUM 40 MG PO TBEC
40.0000 mg | DELAYED_RELEASE_TABLET | Freq: Every day | ORAL | Status: DC
  Administered 2022-04-15 – 2022-04-17 (×3): 40 mg via ORAL
  Filled 2022-04-14 (×3): qty 1

## 2022-04-14 MED ORDER — BENZONATATE 100 MG PO CAPS: 100.0000 mg | ORAL_CAPSULE | Freq: Three times a day (TID) | ORAL | Status: DC | PRN

## 2022-04-14 MED ORDER — ACETAMINOPHEN 325 MG PO TABS: 650.0000 mg | ORAL_TABLET | Freq: Four times a day (QID) | ORAL | Status: DC | PRN

## 2022-04-14 MED ORDER — TIZANIDINE HCL 2 MG PO TABS: 2.0000 mg | ORAL_TABLET | Freq: Two times a day (BID) | ORAL | Status: DC | PRN

## 2022-04-14 MED ORDER — OSELTAMIVIR PHOSPHATE 30 MG PO CAPS
30.0000 mg | ORAL_CAPSULE | Freq: Once | ORAL | Status: AC
  Administered 2022-04-14: 30 mg via ORAL
  Filled 2022-04-14: qty 1

## 2022-04-14 MED ORDER — HEPARIN SODIUM (PORCINE) 5000 UNIT/ML IJ SOLN
5000.0000 [IU] | Freq: Three times a day (TID) | INTRAMUSCULAR | Status: DC
  Administered 2022-04-14 – 2022-04-17 (×9): 5000 [IU] via SUBCUTANEOUS
  Filled 2022-04-14 (×9): qty 1

## 2022-04-14 MED ORDER — AMLODIPINE BESYLATE 10 MG PO TABS
10.0000 mg | ORAL_TABLET | Freq: Every day | ORAL | Status: DC
  Administered 2022-04-15 – 2022-04-17 (×3): 10 mg via ORAL
  Filled 2022-04-14: qty 2
  Filled 2022-04-14: qty 1
  Filled 2022-04-14: qty 2

## 2022-04-14 MED ORDER — LACTATED RINGERS IV BOLUS
1000.0000 mL | Freq: Once | INTRAVENOUS | Status: AC
  Administered 2022-04-14: 1000 mL via INTRAVENOUS

## 2022-04-14 MED ORDER — VITAMIN B-12 100 MCG PO TABS
100.0000 ug | ORAL_TABLET | Freq: Every day | ORAL | Status: DC
  Administered 2022-04-15 – 2022-04-17 (×3): 100 ug via ORAL
  Filled 2022-04-14 (×4): qty 1

## 2022-04-14 MED ORDER — TIMOLOL MALEATE 0.5 % OP SOLN
1.0000 [drp] | Freq: Every day | OPHTHALMIC | Status: DC
  Administered 2022-04-16: 1 [drp] via OPHTHALMIC
  Filled 2022-04-14 (×2): qty 5

## 2022-04-14 MED ORDER — OSELTAMIVIR PHOSPHATE 30 MG PO CAPS
30.0000 mg | ORAL_CAPSULE | Freq: Every day | ORAL | Status: DC
  Administered 2022-04-15 – 2022-04-17 (×3): 30 mg via ORAL
  Filled 2022-04-14 (×3): qty 1

## 2022-04-14 MED ORDER — FLUTICASONE PROPIONATE 50 MCG/ACT NA SUSP
1.0000 | Freq: Every day | NASAL | Status: DC
  Filled 2022-04-14: qty 16

## 2022-04-14 MED ORDER — ALBUTEROL SULFATE (2.5 MG/3ML) 0.083% IN NEBU: 2.5000 mg | INHALATION_SOLUTION | RESPIRATORY_TRACT | Status: DC | PRN

## 2022-04-14 MED ORDER — SODIUM CHLORIDE 0.9 % IV SOLN: INTRAVENOUS | Status: DC

## 2022-04-14 MED ORDER — DONEPEZIL HCL 5 MG PO TABS
10.0000 mg | ORAL_TABLET | Freq: Every day | ORAL | Status: DC
  Administered 2022-04-15 – 2022-04-17 (×3): 10 mg via ORAL
  Filled 2022-04-14 (×3): qty 2

## 2022-04-14 MED ORDER — ACETAMINOPHEN 650 MG RE SUPP: 650.0000 mg | Freq: Four times a day (QID) | RECTAL | Status: DC | PRN

## 2022-04-14 MED ORDER — IOHEXOL 300 MG/ML  SOLN
80.0000 mL | Freq: Once | INTRAMUSCULAR | Status: AC | PRN
  Administered 2022-04-14: 100 mL via INTRAVENOUS

## 2022-04-14 NOTE — ED Notes (Signed)
Pt had medium BM, part liquid part solid. This RN and charge RN changed and placed brief on pt. This RN will monitor frequency of bowel movements before placing purewick.

## 2022-04-14 NOTE — ED Notes (Signed)
Pt assisted to bathroom in room. Tolerated ambulation with assistance well.

## 2022-04-14 NOTE — ED Notes (Addendum)
RN attempted three times for lactic and unsuccessful, lab notified.

## 2022-04-14 NOTE — ED Provider Notes (Signed)
[facility] Provider Note    Event Date/Time   First MD Initiated Contact with Patient 04/14/22 1511     (approximate)   History   Diarrhea   HPI  [name] is a 88 y.o. female   with past medical history of hypertension, hyperlipidemia, prior MI, here with diarrhea and confusion.  Patient reportedly has had severe, watery, diarrhea for the last several days.  She has had a cough.  She has had decreased p.o. intake.  She had increasing confusion.  On my assessment, patient is somewhat confused unable to provide much history.  She does state that she has had watery diarrhea.  Denies any abdominal pain.  Denies any chest pain.  No focal numbness or weakness.  No headaches.      Physical Exam   Triage Vital Signs: ED Triage Vitals  Enc Vitals Group     BP 04/14/22 1440 (!) 146/41     Pulse Rate 04/14/22 1450 78     Resp 04/14/22 1450 19     Temp 04/14/22 1504 98.2 F (36.8 C)     Temp Source 04/14/22 1504 Oral     SpO2 04/14/22 1450 98 %     Weight 04/14/22 1501 136 lb 11 oz (62 kg)     Height 04/14/22 1501 5\' 4"  (1.626 m)     Head Circumference --      Peak Flow --      Pain Score 04/14/22 1501 0     Pain Loc --      Pain Edu? --      Excl. in GC? --     Most recent vital signs: Vitals:   04/14/22 1906 04/14/22 2100  BP: 116/82 (!) 139/59  Pulse: 67 77  Resp: 20 20  Temp: 98.3 F (36.8 C)   SpO2: 97% 94%     General: Awake, no distress.  CV:  Good peripheral perfusion.  Regular rate and rhythm.  No murmurs. Resp:  Normal effort.  Lungs clear to auscultation bilaterally. Abd:  No distention.  Minimal left lower quadrant tenderness.  No guarding. Other:  Disoriented to place and time.  Follows commands.  Moves all extremities with 5 of 5 strength.  No overt focal deficits.   ED Results / Procedures / Treatments   Labs (all labs ordered are listed, but only abnormal results are displayed) Labs Reviewed  RESP PANEL BY RT-PCR  (RSV, FLU A&B, COVID)  [id number] - Abnormal; Notable for the following components:      Result Value   Influenza A by PCR POSITIVE (*)    All other components within normal limits  CBC WITH DIFFERENTIAL/PLATELET - Abnormal; Notable for the following components:   WBC 14.2 (*)    RBC 3.75 (*)    HCT 35.6 (*)    Neutro Abs 12.6 (*)    Abs Immature Granulocytes 0.08 (*)    All other components within normal limits  COMPREHENSIVE METABOLIC PANEL - Abnormal; Notable for the following components:   Sodium 134 (*)    Glucose, Bld 164 (*)    Creatinine, Ser 1.39 (*)    GFR, Estimated 36 (*)    All other components within normal limits  URINALYSIS, ROUTINE W REFLEX MICROSCOPIC - Abnormal; Notable for the following components:   Color, Urine STRAW (*)    APPearance CLEAR (*)    All other components within normal limits  CBG MONITORING, ED - Abnormal; Notable for the following components:  Glucose-Capillary 140 (*)    All other components within normal limits  CULTURE, BLOOD (SINGLE)  GASTROINTESTINAL PANEL BY PCR, STOOL (REPLACES STOOL CULTURE)  C DIFFICILE QUICK SCREEN W PCR REFLEX    URINE CULTURE  LACTIC ACID, PLASMA  LIPASE, BLOOD  LACTIC ACID, PLASMA  BASIC METABOLIC PANEL  CBC     EKG Normal sinus rhythm, ventricular rate 69.  PR 206, QRS 143, QTc 41.  No acute ST elevations or depressions.  No acute evidence of acute ischemia or infarct.   RADIOLOGY Chest x-ray: No active disease CT abdomen/pelvis: No acute intra-abdominal pathology.  Patient has umbilical hernia without evidence of obstruction.  Bibasilar infiltrates noted. CT head: No acute intracranial malady   I also independently reviewed and agree with radiologist interpretations.   PROCEDURES:  Critical Care performed: No   MEDICATIONS ORDERED IN ED: Medications  aspirin EC tablet 81 mg (has no administration in time range)  amLODipine (NORVASC) tablet 10 mg (has no administration in time range)  isosorbide  mononitrate (IMDUR) 24 hr tablet 60 mg (has no administration in time range)  nitroGLYCERIN (NITROSTAT) SL tablet 0.4 mg (has no administration in time range)  rosuvastatin (CRESTOR) tablet 5 mg (has no administration in time range)  donepezil (ARICEPT) tablet 10 mg (has no administration in time range)  pantoprazole (PROTONIX) EC tablet 40 mg (has no administration in time range)  clopidogrel (PLAVIX) tablet 75 mg (has no administration in time range)  vitamin B-12 (CYANOCOBALAMIN) tablet 100 mcg (has no administration in time range)  tiZANidine (ZANAFLEX) tablet 2 mg (has no administration in time range)  fluticasone (FLONASE) 50 MCG/ACT nasal spray 1 spray (has no administration in time range)  loratadine (CLARITIN) tablet 10 mg (has no administration in time range)  timolol (TIMOPTIC) 0.5 % ophthalmic solution 1 drop (has no administration in time range)  polyvinyl alcohol (LIQUIFILM TEARS) 1.4 % ophthalmic solution 1 drop (has no administration in time range)  heparin injection 5,000 Units (5,000 Units Subcutaneous Given 04/14/22 2145)  sodium chloride flush (NS) 0.9 % injection 3 mL (3 mLs Intravenous Given 04/14/22 2145)  0.9 %  sodium chloride infusion ( Intravenous New Bag/Given 04/14/22 1915)  acetaminophen (TYLENOL) tablet 650 mg (has no administration in time range)    Or  acetaminophen (TYLENOL) suppository 650 mg (has no administration in time range)  guaiFENesin (MUCINEX) 12 hr tablet 600 mg (600 mg Oral Given 04/14/22 2145)  albuterol (PROVENTIL) (2.5 MG/3ML) 0.083% nebulizer solution 2.5 mg (has no administration in time range)  hydrALAZINE (APRESOLINE) injection 5 mg (has no administration in time range)  oseltamivir (TAMIFLU) capsule 30 mg (has no administration in time range)  benzonatate (TESSALON) capsule 100 mg (has no administration in time range)  insulin aspart (novoLOG) injection 0-9 Units (has no administration in time range)  lactated ringers bolus 1,000 mL (0 mLs  Intravenous Stopped 04/14/22 1922)  iohexol (OMNIPAQUE) 300 MG/ML solution 80 mL (100 mLs Intravenous Contrast Given 04/14/22 1656)  oseltamivir (TAMIFLU) capsule 30 mg (30 mg Oral Given 04/14/22 1819)     IMPRESSION / MDM / ASSESSMENT AND PLAN / ED COURSE  I reviewed the triage vital signs and the nursing notes.                              Differential diagnosis includes, but is not limited to, viral GI illness, influenza, covid-19, diverticulitis, enteritis, SBO, polypharmacy  Patient's presentation is most consistent with acute  presentation with potential threat to life or bodily function.  The patient is on the cardiac monitor to evaluate for evidence of arrhythmia and/or significant heart rate changes.  88 year old female here with generalized weakness, diarrhea, and slight confusion.  Suspect acute encephalopathy in the setting of influenza.  Patient has moderate leukocytosis of 14,000.  CMP shows likely dehydration with creatinine 1.39 and is otherwise unremarkable.  Urinalysis negative.  Lactic acid normal which is reassuring.  Influenza A is positive.  CT head, abdomen/pelvis obtained, reviewed by me.  CT head is negative.  Abdomen pelvis shows no evidence of acute abnormality but does show basilar infiltrates.  Chest x-ray however is clear.  Will admit for dehydration and altered mental status in the setting of influenza.     FINAL CLINICAL IMPRESSION(S) / ED DIAGNOSES   Final diagnoses:  Influenza A  Dehydration     Rx / DC Orders   ED Discharge Orders     None        Note:  This document was prepared using Dragon voice recognition software and may include unintentional dictation errors.   Shaune Pollack, MD 04/14/22 [phone]

## 2022-04-14 NOTE — H&P (Signed)
History and Physical    Patient: [name] [id number] DOB: 04-09-1931 DOA: 04/14/2022 DOS: the patient was seen and examined on 04/14/2022 PCP: Rusty Aus, MD  Patient coming from: Home  Chief Complaint:  Chief Complaint  Patient presents with   Diarrhea   HPI:  Patient is a poor historian hence, history was mostly obtained from son via phone.  [name] is a 88 y.o. female with medical history significant for hypertension, hyperlipidemia, diet-controlled diabetes mellitus with a last A1c of 6.5, coronary disease status post PCI/stent.  She was last admitted in October 2023 on account of the syncopal episode.  She was seen and treated.  Subsequently discharged and has been adjusted of health until this morning when son, she lives with, felt patient was unusually weak from her baseline.  She was slightly confused.  She had difficulty getting out of bed this morning and off the toilet this morning.  The symptoms have not preceded by coughing spells since yesterday.  She denies any associated fever or chills.  Denies nausea or vomiting.  Son also reported 3 episodes of diarrhea earlier today.  On account of the symptoms, patient was brought to the emergency room to be further evaluated.  Workup in the ED was significant for influenza A infection.  COVID was negative.  She is not requiring any oxygen.  Chest x-ray was unremarkable for any active disease.  Review of Systems: As mentioned in the history of present illness. All other systems reviewed and are negative. Past Medical History:  Diagnosis Date   Hypercholesteremia    Hypertension    Myocardial infarction ([place]) 2008   S/P angioplasty with stent    Past Surgical History:  Procedure Laterality Date   ABDOMINAL HYSTERECTOMY     APPENDECTOMY     CORONARY ANGIOPLASTY WITH STENT PLACEMENT     CORONARY STENT INTERVENTION N/A 12/12/2019   Procedure: CORONARY STENT INTERVENTION;  Surgeon: Isaias Cowman, MD;   Location: [place] [facility];  Service: Cardiovascular;  Laterality: N/A;   LEFT HEART CATH AND CORONARY ANGIOGRAPHY N/A 12/12/2019   Procedure: LEFT HEART CATH AND CORONARY ANGIOGRAPHY with possible PCI;  Surgeon: Isaias Cowman, MD;  Location: [place] [facility];  Service: Cardiovascular;  Laterality: N/A;   Social History:  reports that she has never smoked. She has never used smokeless tobacco. She reports that she does not drink alcohol and does not use drugs.  Allergies  Allergen Reactions   Butenafine Swelling    Tongue swelled and turned red   Levofloxacin Swelling and Other (See Comments)    Other reaction(s): Other (See Comments) Tongue swelled and turned red   Limonene Other (See Comments)    Other reaction(s): Other (See Comments) Tongue swelled and turned red Tongue swelled and turned red    Penicillins     Other reaction(s): Unknown   Terbinafine And Related Other (See Comments)    Tongue swelled and turned red    Family History  Problem Relation Age of Onset   Cancer Mother    Heart disease Father    Breast cancer Maternal Aunt     Prior to Admission medications   Medication Sig Start Date End Date Taking? Authorizing Provider  Amino Acids-Protein Hydrolys (PRO-STAT) LIQD Take 30 mLs by mouth 2 (two) times daily.    [provider]  amLODipine (NORVASC) 10 MG tablet Take 1 tablet (10 mg total) by mouth daily. 12/21/20   Nolberto Hanlon, MD  aspirin EC 81  MG EC tablet Take 1 tablet (81 mg total) by mouth daily. Swallow whole. 12/21/20   Lynn Ito, MD  carboxymethylcellulose (REFRESH PLUS) 0.5 % SOLN 1 drop daily as needed.    [provider]  cetirizine (ZYRTEC) 5 MG tablet Take 5 mg by mouth daily.    [provider]  clopidogrel (PLAVIX) 75 MG tablet Take 75 mg by mouth daily.    [provider]  Cranberry 400 MG CAPS Take 1 capsule by mouth 2 (two) times daily.    [provider]  diphenhydrAMINE (BENADRYL) 25  MG tablet  09/18/21   [provider]  docusate sodium (COLACE) 100 MG capsule Take 2 capsules (200 mg total) by mouth 2 (two) times daily. 12/20/20   Lynn Ito, MD  donepezil (ARICEPT) 10 MG tablet Take 10 mg by mouth daily. 08/16/20   [provider]  fluticasone (FLONASE) 50 MCG/ACT nasal spray Place 1 spray into both nostrils daily. Patient not taking: Reported on 02/05/2022 12/21/20   Lynn Ito, MD  isosorbide mononitrate (IMDUR) 60 MG 24 hr tablet Take 1 tablet (60 mg total) by mouth daily. 12/21/20   Lynn Ito, MD  magnesium oxide (MAG-OX) 400 MG tablet Take 400 mg by mouth daily.     [provider]  Multiple Vitamins-Iron (MULTIVITAMIN/IRON PO) Take 1 tablet by mouth daily.    [provider]  nitroGLYCERIN (NITROSTAT) 0.4 MG SL tablet Place 0.4 mg under the tongue every 5 (five) minutes as needed. For chest pain    [provider]  omeprazole (PRILOSEC) 20 MG capsule Take 20 mg by mouth daily.  11/22/18   [provider]  polyethylene glycol (MIRALAX / GLYCOLAX) 17 g packet Take 17 g by mouth daily. 12/21/20   Lynn Ito, MD  rosuvastatin (CRESTOR) 5 MG tablet Take 5 mg by mouth daily.    [provider]  timolol (TIMOPTIC) 0.5 % ophthalmic solution Place 1 drop into both eyes at bedtime. 06/26/19   [provider]  tizanidine (ZANAFLEX) 2 MG capsule Take 2 mg by mouth 2 (two) times daily as needed for muscle spasms.    [provider]  torsemide (DEMADEX) 20 MG tablet  03/10/21   [provider]  vitamin B-12 (CYANOCOBALAMIN) 100 MCG tablet Take 100 mcg by mouth daily.    [provider]    Physical Exam: Vitals:   04/14/22 1450 04/14/22 1500 04/14/22 1501 04/14/22 1504  BP:  (!) 126/34    Pulse: 78 72    Resp: 19 20    Temp:    98.2 F (36.8 C)  TempSrc:    Oral  SpO2: 98% 98%    Weight:   62 kg   Height:   5\' 4"  (1.626 m)    General: Patient is an elderly, pleasantly confused  female.  She was laying comfortably in bed not in any acute distress.  She was easily arousable and able to answer some questions. HEENT: Head and neck is atraumatic.  Oral mucosa is moist. Neck: Supple with no JVD. Cardiovascular: S1-S2 with no murmur. Chest: Diminished bilaterally due to poor respiratory effort.  Unable to appreciate any rales or rhonchi. Abdomen: Soft and nontender.  Bowel sounds present. Extremities without any pedal edema. Skin without any obvious rashes or ulcers.  Data Reviewed: t   WBC 12.8, hemoglobin 12.1, hematocrit 35.6, neutrophil 88, serum sodium 134, potassium 3.8, bicarb 23, glucose 164 BUN 19 creatinine 1.39.  Influenza A positive.  Radiology:  DG Chest Portable 1 View   Result Date: 04/14/2022 Cardiac and mediastinal contours within normal limits. Mitral annular. Bibasilar atelectasis. Lungs are otherwise clear. No evidence of pleural effusion or pneumothorax.   IMPRESSION: No active disease.   CT abdomen and pelvis: Umbilical hernia containing fat and a minimal segment of transverse colon without obstruction or wall thickening.   Small RIGHT inguinal hernia containing fat.   LEFT ovarian cyst 3.5 x 3.8 x 3.1 cm, simple appearing cyst increased since 2019 when it measured 1.2 cm greatest size; follow-up imaging recommended in 6-12 months.   Extensive atherosclerotic calcifications including coronary arteries.   Hazy bibasilar infiltrates.   Aortic Atherosclerosis (ICD10-I70.0).  Assessment and Plan: 88 year old female with medical comorbidities that include coronary disease status post PCI, chronic kidney disease stage IIIa, hypertension, hyperlipidemia and diet-controlled diabetes.  She presents with generalized weakness and diarrhea and found to be influenza A positive.  Influenza A positive with associated generalized weakness in an elderly patient.  Patient will be managed conservatively with IV fluids, analgesics and guaifenesin.   Tamiflu will be ordered for treatment.  CT scan however revealed bilateral basilar infiltrates.  Will monitor for improvement of symptoms.  Anticipate possible discharge when clinically improved.  Generalized weakness: Most likely as result of viral prodrome.  Physical therapy to work with patient to evaluate for home needs prior to discharge.  Acute on chronic kidney disease stage III: Patient will be well repleted with IV fluids.  Follow-up with renal function in AM.  Avoid nephrotoxic agents.  Diet-controlled diabetes mellitus: Last A1c was 6.5.  Blood glucose 164 on admission.  Will carefully monitor fingerstick glucose before every meal monitoring.  ADA diet advised.  Diarrhea: CT scan of the abdomen and pelvis were unremarkable for any acute abnormality.  Likely due to viral prodrome.  Coronary artery disease: Patient is asymptomatic at this time.  Patient is on dual antiplatelet therapy with aspirin and Plavix.  Continue with Imdur and statins.  Mixed hyperlipidemia: On statin.     Advance Care Planning:   Code Status: Full Code   Consults: None  Family Communication: Yes spoke with son via phone.  Severity of Illness: The appropriate patient status for this patient is Inpatient. Inpatient status is judged to be reasonable and necessary in order to provide the required intensity of service to ensure the patient's safety. The patient's presenting symptoms, physical exam findings, and initial radiographic and laboratory data in the context of their medical condition is felt to place them at increased risk for further clinical deterioration. Furthermore, it is anticipated that the patient will be medically stable for discharge from the hospital within 2 midnights of admission.   Author: Artist Beach, MD 04/14/2022 7:16 PM  For on call review [email or web address].

## 2022-04-14 NOTE — ED Notes (Signed)
Pts O2 sats on RA while sleeping dropped to 88-89%; Pt placed on 2L [place] - O2 improved to 98%. No distress noted.

## 2022-04-14 NOTE — ED Triage Notes (Signed)
Pt at home with mental status change and diarrhea. Pt had three watery diarrhea movements on EMS ride. Family reported she has been sick for the last couple days with a cough. EMS CBG check 206 and gave 400 cc of LR which improved pt's mentation slighlty.

## 2022-04-15 DIAGNOSIS — R531 Weakness: Secondary | ICD-10-CM | POA: Diagnosis not present

## 2022-04-15 LAB — BASIC METABOLIC PANEL
Anion gap: 10 (ref 5–15)
BUN: 18 mg/dL (ref 8–23)
CO2: 22 mmol/L (ref 22–32)
Calcium: 8.5 mg/dL — ABNORMAL LOW (ref 8.9–10.3)
Chloride: 104 mmol/L (ref 98–111)
Creatinine, Ser: 1.31 mg/dL — ABNORMAL HIGH (ref 0.44–1.00)
GFR, Estimated: 39 mL/min — ABNORMAL LOW (ref >60)
Glucose, Bld: 132 mg/dL — ABNORMAL HIGH (ref 70–99)
Potassium: 3.8 mmol/L (ref 3.5–5.1)
Sodium: 136 mmol/L (ref 135–145)

## 2022-04-15 LAB — CBC
HCT: 32.8 % — ABNORMAL LOW (ref 36.0–46.0)
Hemoglobin: 11.2 g/dL — ABNORMAL LOW (ref 12.0–15.0)
MCH: 32.1 pg (ref 26.0–34.0)
MCHC: 34.1 g/dL (ref 30.0–36.0)
MCV: 94 fL (ref 80.0–100.0)
Platelets: 244 10*3/uL (ref 150–400)
RBC: 3.49 MIL/uL — ABNORMAL LOW (ref 3.87–5.11)
RDW: 13.8 % (ref 11.5–15.5)
WBC: 10.9 10*3/uL — ABNORMAL HIGH (ref 4.0–10.5)
nRBC: 0 % (ref 0.0–0.2)

## 2022-04-15 LAB — CBG MONITORING, ED
Glucose-Capillary: 106 mg/dL — ABNORMAL HIGH (ref 70–99)
Glucose-Capillary: 100 mg/dL — ABNORMAL HIGH (ref 70–99)
Glucose-Capillary: 117 mg/dL — ABNORMAL HIGH (ref 70–99)
Glucose-Capillary: 97 mg/dL (ref 70–99)

## 2022-04-15 NOTE — Evaluation (Signed)
Occupational Therapy Evaluation Patient Details Name: [name] MRN: [id number] DOB: [birth year] Today's Date: 04/15/2022   History of Present Illness [name] is an 89yoF who [facility] c AMS and diarrhea, preceding says of illness. (+) Flu test; COVID negative. PMH: CAD s/p stent, HTN, HLD, DM, CKD, non-STEMI, COVID19, syncope.   Clinical Impression   Patient presenting with decreased Ind in self care,balance, functional mobility/transfers, endurance, and safety awareness.  Patient reports being mod I at baseline with use of RW and being able to complete ADLs independently but shares IADL tasks with family. She lives with family and they drive her to appointments as needed. She does not endorse being on O2 at baseline and currently on 2Ls via [place].  Patient currently functioning at min A for bed mobility and sits on EOB for ~ 5 minutes with close supervision. Pt does endorse fatigue and having recently been up with PT. She reports family will be able to assist at discharge. Patient will benefit from acute OT to increase overall independence in the areas of ADLs, functional mobility, and safety awareness  in order to safely discharge home with family.     Recommendations for follow up therapy are one component of a multi-disciplinary discharge planning process, led by the attending physician.  Recommendations may be updated based on patient status, additional functional criteria and insurance authorization.   Follow Up Recommendations  Home health OT     Assistance Recommended at Discharge Intermittent Supervision/Assistance  Patient can return home with the following A little help with walking and/or transfers;A little help with bathing/dressing/bathroom;Assistance with cooking/housework;Help with stairs or ramp for entrance;Assist for transportation    Functional Status Assessment  Patient has had a recent decline in their functional status and demonstrates the ability to  make significant improvements in function in a reasonable and predictable amount of time.  Equipment Recommendations  None recommended by OT       Precautions / Restrictions Precautions Precautions: Fall Restrictions Weight Bearing Restrictions: No      Mobility Bed Mobility Overal bed mobility: Needs Assistance Bed Mobility: Supine to Sit, Sit to Supine     Supine to sit: Min assist Sit to supine: Min assist   General bed mobility comments: min A for trunk support to get to EOB    Transfers Overall transfer level: Needs assistance Equipment used: Rolling walker (2 wheels) Transfers: Sit to/from Stand Sit to Stand: Min assist                  Balance Overall balance assessment: Needs assistance Sitting-balance support: Feet unsupported, Single extremity supported Sitting balance-Leahy Scale: Good                                     ADL either performed or assessed with clinical judgement   ADL Overall ADL's : Needs assistance/impaired     Grooming: Wash/dry hands;Wash/dry face;Sitting;Set up;Supervision/safety                                 General ADL Comments: OT anticipates pt needing set up A for UB self care and mod A for LB self care.     Vision Patient Visual Report: No change from baseline              Pertinent Vitals/Pain Pain Assessment Pain Assessment:  No/denies pain     Hand Dominance Right   Extremity/Trunk Assessment Upper Extremity Assessment Upper Extremity Assessment: Generalized weakness;Overall [facility] for tasks assessed   Lower Extremity Assessment Lower Extremity Assessment: Generalized weakness;Overall [facility] for tasks assessed   Cervical / Trunk Assessment Cervical / Trunk Assessment: Kyphotic   Communication Communication Communication: HOH   Cognition Arousal/Alertness: Awake/alert Behavior During Therapy: WFL for tasks assessed/performed Overall Cognitive Status: Within Functional  Limits for tasks assessed                                                  Home Living Family/patient expects to be discharged to:: Private residence Living Arrangements: Children Available Help at Discharge: Family Type of Home: House Home Access: Ramped entrance     Home Layout: One level     Bathroom Shower/Tub: Hospital doctor Toilet: Handicapped height     Home Equipment: Conservation officer, nature (2 wheels);Cane - single point;Rollator (4 wheels);Shower seat;Shower seat - built in   Additional Comments: sleeps in a recliner at home      Prior Functioning/Environment Prior Level of Function : Independent/Modified Independent               ADLs Comments: Pt reports mod I for self care tasks and assists with IADLs. Her son drives her to appointments.        OT Problem List: Decreased strength;Decreased activity tolerance;Impaired balance (sitting and/or standing);Decreased safety awareness;Decreased knowledge of use of DME or AE      OT Treatment/Interventions: Self-care/ADL training;Therapeutic exercise;Therapeutic activities;DME and/or AE instruction;Manual therapy;Balance training;Patient/family education;Energy conservation    OT Goals(Current goals can be found in the care plan section) Acute Rehab OT Goals Patient Stated Goal: to go home OT Goal Formulation: With patient Time For Goal Achievement: 04/29/22 Potential to Achieve Goals: Fair ADL Goals Pt Will Perform Grooming: with modified independence;standing Pt Will Perform Lower Body Dressing: with supervision;sit to/from stand Pt Will Transfer to Toilet: with supervision;ambulating Pt Will Perform Toileting - Clothing Manipulation and hygiene: with supervision;sit to/from stand  OT Frequency: Min 2X/week       AM-PAC OT "6 Clicks" Daily Activity     Outcome Measure Help from another person eating meals?: None Help from another person taking care of personal grooming?:  None Help from another person toileting, which includes using toliet, bedpan, or urinal?: A Little Help from another person bathing (including washing, rinsing, drying)?: A Little Help from another person to put on and taking off regular upper body clothing?: A Little Help from another person to put on and taking off regular lower body clothing?: A Little 6 Click Score: 20   End of Session Nurse Communication: Mobility status  Activity Tolerance: Patient limited by fatigue Patient left: in bed;with call bell/phone within reach;with bed alarm set  OT Visit Diagnosis: Unsteadiness on feet (R26.81);Muscle weakness (generalized) (M62.81)                Time: [id number] OT Time Calculation (min): 10 min Charges:  OT General Charges $OT Visit: 1 Visit OT Evaluation $OT Eval Moderate Complexity: 1 [address] [place], OTR/L , CBIS ascom [phone]  04/15/22, 1:40 PM
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
## 2022-04-15 NOTE — Evaluation (Signed)
Physical Therapy Evaluation Patient Details Name: [name] MRN: [id number] DOB: 03/01/1931 Today's Date: 04/15/2022  History of Present Illness  [name] is an 71yoF who [facility] c AMS and diarrhea, preceding says of illness. (+) Flu test; COVID negative. PMH: CAD s/p stent, HTN, HLD, DM, CKD, non-STEMI, COVID19, syncope.  Clinical Impression  Pt admitted with above Dx. Pt has functional limitations due to deficits below (see "PT Problem List"). Pt able to provide details on baseline functional status. Today pt requires some physical assistance to perform bed mobility, transfers, and short distance walking. Patient's performance this date reveals decreased ability, independence, and tolerance in performing all basic mobility required for performance of activities of daily living. Pt requires additional DME, close physical assistance, and cues for safe participate in mobility, however is not exceptionally far from her baseline, I suspect her needs can be met with family with whom she lives. Pt will benefit from skilled PT intervention to increase independence and safety with basic mobility in preparation for discharge to the venue listed below.     No data found.      Recommendations for follow up therapy are one component of a multi-disciplinary discharge planning process, led by the attending physician.  Recommendations may be updated based on patient status, additional functional criteria and insurance authorization.  Follow Up Recommendations Home health PT      Assistance Recommended at Discharge Frequent or constant Supervision/Assistance  Patient can return home with the following  A little help with bathing/dressing/bathroom;A little help with walking and/or transfers;Assist for transportation;Help with stairs or ramp for entrance;Assistance with cooking/housework;Assistance with feeding    Equipment Recommendations None recommended by PT  Recommendations for  Other Services       Functional Status Assessment Patient has had a recent decline in their functional status and demonstrates the ability to make significant improvements in function in a reasonable and predictable amount of time.     Precautions / Restrictions Precautions Precautions: Fall Restrictions Weight Bearing Restrictions: No      Mobility  Bed Mobility Overal bed mobility: Needs Assistance Bed Mobility: Supine to Sit, Sit to Supine     Supine to sit: Mod assist Sit to supine: Max assist   General bed mobility comments: difficulty remaining short sitting EOB, posterior and Rt trunk lean/weakness    Transfers Overall transfer level: Needs assistance Equipment used: Rolling walker (2 wheels) Transfers: Sit to/from Stand, Bed to chair/wheelchair/BSC Sit to Stand: Min assist Stand pivot transfers: Min guard              Ambulation/Gait Ambulation/Gait assistance:  (deferred due to weakness, drowsiness while up)                Stairs            Wheelchair Mobility    Modified Rankin (Stroke Patients Only)       Balance                                             Pertinent Vitals/Pain Pain Assessment Pain Assessment: No/denies pain    Home Living Family/patient expects to be discharged to:: Private residence Living Arrangements: Children Available Help at Discharge: Family Type of Home: House Home Access: Ramped entrance       Home Layout: One level Home Equipment: Conservation officer, nature (2 wheels);Cane - single  point;Rollator (4 wheels);Shower seat;Shower seat - built in Additional Comments: sleeps in a recliner at home    Prior Function                       Hand Dominance   Dominant Hand: Right    Extremity/Trunk Assessment   Upper Extremity Assessment Upper Extremity Assessment: Generalized weakness;Overall [facility] for tasks assessed    Lower Extremity Assessment Lower Extremity Assessment:  Generalized weakness;Overall [facility] for tasks assessed    Cervical / Trunk Assessment Cervical / Trunk Assessment: Kyphotic  Communication   Communication: HOH  Cognition Arousal/Alertness: Awake/alert Behavior During Therapy: WFL for tasks assessed/performed Overall Cognitive Status: Within Functional Limits for tasks assessed                                          General Comments      Exercises     Assessment/Plan    PT Assessment Patient needs continued PT services  PT Problem List Decreased strength;Decreased activity tolerance;Decreased balance;Decreased mobility;Decreased knowledge of use of DME       PT Treatment Interventions DME instruction;Balance training;Gait training;Stair training;Patient/family education;Functional mobility training;Therapeutic activities;Therapeutic exercise    PT Goals (Current goals can be found in the Care Plan section)  Acute Rehab PT Goals Patient Stated Goal: Feel better, regain strength PT Goal Formulation: With patient Time For Goal Achievement: 04/29/22 Potential to Achieve Goals: Good    Frequency Min 2X/week     Co-evaluation               AM-PAC PT "6 Clicks" Mobility  Outcome Measure Help needed turning from your back to your side while in a flat bed without using bedrails?: A Lot Help needed moving from lying on your back to sitting on the side of a flat bed without using bedrails?: A Lot Help needed moving to and from a bed to a chair (including a wheelchair)?: A Lot Help needed standing up from a chair using your arms (e.g., wheelchair or bedside chair)?: A Lot Help needed to walk in hospital room?: A Little Help needed climbing 3-5 steps with a railing? : A Little 6 Click Score: 14    End of Session Equipment Utilized During Treatment: Gait belt Activity Tolerance: Patient tolerated treatment well;No increased pain;Patient limited by fatigue Patient left: in bed;with call bell/phone within  reach;with nursing/sitter in room;with family/visitor present Nurse Communication: Mobility status PT Visit Diagnosis: Difficulty in walking, not elsewhere classified (R26.2);Other abnormalities of gait and mobility (R26.89);Muscle weakness (generalized) (M62.81)    Time: 0921-1002 PT Time Calculation (min) (ACUTE ONLY): 41 min   Charges:   PT Evaluation $PT Eval Low Complexity: 1 Low PT Treatments $Therapeutic Activity: 8-22 mins       10:11 AM, 04/15/22 Etta Grandchild, PT, DPT Physical Therapist - [facility]  [phone] ([place])   , C 04/15/2022, 10:09 AM

## 2022-04-15 NOTE — Progress Notes (Signed)
  Progress Note   Patient: [name] WUJ:[id number] DOB: 10/08/1930 DOA: 04/14/2022     1 DOS: the patient was seen and examined on 04/15/2022   Brief hospital course: 88 year old female with medical comorbidities that include coronary disease status post PCI, chronic kidney disease stage IIIa, hypertension, hyperlipidemia and diet-controlled diabetes. She presents with generalized weakness and diarrhea and found to be influenza A positive.   Assessment and Plan:  Influenza A positive without pneumonia.   Patient was managed conservatively with IV fluids, analgesics and guaifenesin and Tamiflu. CT scan of abdomen however revealed bilateral basilar infiltrates.  Chest x-ray does not suggest any active pulmonary disease.  Wean off oxygen supplementation as tolerated by patient.  Anticipate possible discharge in a.m.   Generalized weakness: Most likely as result of viral prodrome symptoms.  Physical therapy evaluation appreciated.  Acute on chronic kidney injury: Renal function near baseline.  Continue with IV fluids and p.o. oral intake.  Avoid nephrotoxic agents.   Diet-controlled diabetes mellitus: Last A1c was 6.5. Sliding scale in place.  Will carefully monitor fingerstick glucose before every meal monitoring.  ADA diet advised.   Diarrhea: Resolved.  CT scan of the abdomen and pelvis were unremarkable for any acute abnormality.  Likely due to viral prodrome.   Coronary artery disease: Patient is asymptomatic at this time.  Patient is on dual antiplatelet therapy with aspirin and Plavix.  Continue with Imdur and statins.   Mixed hyperlipidemia: On statin.     Subjective:  Patient feels clinically better.  Still has some cough.  She had a better night sleep.  She was able to work with physical therapy today with appreciable gait.  No fever or chills.  Physical Exam: Vitals:   04/15/22 0829 04/15/22 1004 04/15/22 1012 04/15/22 1300  BP: (!) 154/49   (!) 145/45  Pulse: 80   84   Resp: (!) 23   (!) 25  Temp: 100.3 F (37.9 C)   99 F (37.2 C)  TempSrc: Oral     SpO2: 96% (!) 88% 93% 93%  Weight:      Height:       Patient was seen laying comfortably in bed not in any acute distress, head is atraumatic, neck supple no JVD.  Chest clinically diminished bilaterally.  No rales or rhonchi appreciated.  Abdomen soft nontender with no organomegaly.  Extremities without pedal edema.  Data Reviewed:  WBCs 9.8, hemoglobin 10.1, hematocrit 30.4, platelet count 496, neutrophil 71.  Sodium is 134, potassium 4.0, chloride 101, bicarb 25, glucose 135, BUN 26, creatinine 0.97.  AST 10, ALT 9, total protein 6.9.  Family Communication: Spoke with son yesterday.  Was updated regarding patient's clinical course and treatment.  Disposition: Status is: Inpatient Remains inpatient appropriate because: Possible discharge in a.m. pending improvement with symptoms  Planned Discharge Destination: Home with Home Health DVT prophylaxis with heparin    Time spent: 35 minutes  Author: Lilia Pro, MD 04/15/2022 4:04 PM  For on call review [email or web address].

## 2022-04-16 ENCOUNTER — Encounter: Payer: Self-pay | Admitting: Internal Medicine

## 2022-04-16 DIAGNOSIS — E860 Dehydration: Secondary | ICD-10-CM | POA: Diagnosis not present

## 2022-04-16 DIAGNOSIS — J101 Influenza due to other identified influenza virus with other respiratory manifestations: Secondary | ICD-10-CM | POA: Diagnosis not present

## 2022-04-16 DIAGNOSIS — R531 Weakness: Secondary | ICD-10-CM | POA: Diagnosis not present

## 2022-04-16 LAB — CBG MONITORING, ED: Glucose-Capillary: 90 mg/dL (ref 70–99)

## 2022-04-16 LAB — GLUCOSE, CAPILLARY
Glucose-Capillary: 171 mg/dL — ABNORMAL HIGH (ref 70–99)
Glucose-Capillary: 282 mg/dL — ABNORMAL HIGH (ref 70–99)
Glucose-Capillary: 214 mg/dL — ABNORMAL HIGH (ref 70–99)

## 2022-04-16 MED ORDER — AZITHROMYCIN 500 MG PO TABS
500.0000 mg | ORAL_TABLET | Freq: Every day | ORAL | Status: DC
  Administered 2022-04-16 – 2022-04-17 (×2): 500 mg via ORAL
  Filled 2022-04-16 (×2): qty 1

## 2022-04-16 MED ORDER — PREDNISONE 20 MG PO TABS
40.0000 mg | ORAL_TABLET | Freq: Every day | ORAL | Status: DC
  Administered 2022-04-16 – 2022-04-17 (×2): 40 mg via ORAL
  Filled 2022-04-16 (×2): qty 2

## 2022-04-16 MED ORDER — GUAIFENESIN-DM 100-10 MG/5ML PO SYRP: 10.0000 mL | ORAL_SOLUTION | ORAL | Status: DC | PRN

## 2022-04-16 MED ORDER — IPRATROPIUM-ALBUTEROL 0.5-2.5 (3) MG/3ML IN SOLN: 3.0000 mL | Freq: Four times a day (QID) | RESPIRATORY_TRACT | Status: DC | PRN

## 2022-04-16 NOTE — ED Notes (Signed)
All belongings taken upstairs via transport.

## 2022-04-16 NOTE — Progress Notes (Signed)
[facility]                                                                               Uma Jerde, is a 88 y.o. female, DOB - [birth year], PHX:[id number] Admit date - 04/14/2022    Outpatient Primary MD for the patient is Danella Penton, MD  LOS - 2  days    Brief summary   89 year old female with medical comorbidities that include coronary disease status post PCI, chronic kidney disease stage IIIa, hypertension, hyperlipidemia and diet-controlled diabetes. She presents with generalized weakness and diarrhea and found to be influenza A positive.     Assessment & Plan    Assessment and Plan:  Influenza positive infection Symptomatic management with fluids, Tamiflu Patient reports productive cough. Will start her on Zithromax for acute bronchitis Nasal cannula oxygen to keep sats greater than 90% On exam she had wheezing recommend DuoNebs and quick steroid taper. Try to wean her off the oxygen in the next 24 hours and possible discharge in the morning. Generalized weakness secondary to viral infection Therapy evaluations ordered.    Acute kidney injury secondary to dehydration Gently hydrate and repeat renal parameters in the morning.    Diet-controlled diabetes mellitus Last A1c around 6.1 Continue sliding scale insulin.    Diarrhea Resolved   History of coronary artery disease Patient currently denies any chest pain currently on aspirin and Plavix continue the same.   Mixed hyperlipidemia on statin continue the same   Estimated body mass index is 23.46 kg/m as calculated from the following:   Height as of this encounter: 5\' 4"  (1.626 m).   Weight as of this encounter: 62 kg.  Code Status: full code.  DVT Prophylaxis:  heparin injection 5,000 Units Start: 04/14/22 2200 SCDs Start: 04/14/22 1902   Level of Care: Level of care: Med-Surg Family Communication: none at bedside.   Disposition Plan:     Remains inpatient appropriate:   pending improvement.   Procedures:  None  Consultants:   None  Antimicrobials:   Anti-infectives (From admission, onward)    Start     Dose/Rate Route Frequency Ordered Stop   04/16/22 1100  azithromycin (ZITHROMAX) tablet 500 mg        500 mg Oral Daily 04/16/22 1048 04/21/22 0959   04/15/22 1000  oseltamivir (TAMIFLU) capsule 30 mg        30 mg Oral Daily 04/14/22 1909 04/19/22 0959   04/14/22 1800  oseltamivir (TAMIFLU) capsule 30 mg        30 mg Oral  Once 04/14/22 1745 04/14/22 1819        Medications  Scheduled Meds:  amLODipine  10 mg Oral Daily   aspirin EC  81 mg Oral Daily   azithromycin  500 mg Oral Daily   clopidogrel  75 mg Oral Daily   donepezil  10 mg Oral Daily   fluticasone  1 spray Each Nare Daily   heparin  5,000 Units Subcutaneous Q8H   insulin aspart  0-9 Units Subcutaneous TID WC   isosorbide mononitrate  60 mg Oral Daily   loratadine  10 mg Oral Daily   oseltamivir  30 mg  Oral Daily   pantoprazole  40 mg Oral Daily   predniSONE  40 mg Oral QAC breakfast   rosuvastatin  5 mg Oral Daily   sodium chloride flush  3 mL Intravenous Q12H   timolol  1 drop Both Eyes QHS   vitamin B-12  100 mcg Oral Daily   Continuous Infusions:  sodium chloride 60 mL/hr at 04/15/22 2100   PRN Meds:.acetaminophen **OR** acetaminophen, albuterol, benzonatate, guaiFENesin-dextromethorphan, hydrALAZINE, ipratropium-albuterol, nitroGLYCERIN, polyvinyl alcohol, tiZANidine    Subjective:   Lacresha Fusilier was seen and examined today.  Still short of breath, wheezing and coughing.  Objective:   Vitals:   04/16/22 0700 04/16/22 0800 04/16/22 0900 04/16/22 1000  BP: (!) 131/46 (!) 145/41 (!) 137/43 (!) 129/56  Pulse: 75 76 73 81  Resp: (!) 27 20 (!) 26 (!) 24  Temp:    98.6 F (37 C)  TempSrc:      SpO2: 93% 93% 91% 92%  Weight:      Height:       No intake or output data in the 24 hours ending 04/16/22 1049 Filed Weights   04/14/22 1501  Weight: 62 kg      Exam General exam: Appears calm and comfortable  Respiratory system: Wheezing both anteriorly and posteriorly on 2 L of nasal cannula oxygen. Cardiovascular system: S1 & S2 heard, RRR. No JVD,  Gastrointestinal system: Abdomen is nondistended, soft and nontender. Central nervous system: Alert and oriented. No focal neurological deficits. Extremities: Symmetric 5 x 5 power. Skin: No rashes, Psychiatry: Mood & affect appropriate.     Data Reviewed:  I have personally reviewed following labs and imaging studies   CBC Lab Results  Component Value Date   WBC 10.9 (H) 04/15/2022   RBC 3.49 (L) 04/15/2022   HGB 11.2 (L) 04/15/2022   HCT 32.8 (L) 04/15/2022   MCV 94.0 04/15/2022   MCH 32.1 04/15/2022   PLT 244 04/15/2022   MCHC 34.1 04/15/2022   RDW 13.8 04/15/2022   LYMPHSABS 0.7 04/14/2022   MONOABS 0.8 04/14/2022   EOSABS 0.0 04/14/2022   BASOSABS 0.1 04/14/2022     Last metabolic panel Lab Results  Component Value Date   NA 136 04/15/2022   K 3.8 04/15/2022   CL 104 04/15/2022   CO2 22 04/15/2022   BUN 18 04/15/2022   CREATININE 1.31 (H) 04/15/2022   GLUCOSE 132 (H) 04/15/2022   GFRNONAA 39 (L) 04/15/2022   GFRAA 46 (L) 12/13/2019   CALCIUM 8.5 (L) 04/15/2022   PROT 6.8 04/14/2022   ALBUMIN 3.7 04/14/2022   LABGLOB 3.2 01/27/2021   BILITOT 1.1 04/14/2022   ALKPHOS 74 04/14/2022   AST 27 04/14/2022   ALT 14 04/14/2022   ANIONGAP 10 04/15/2022    CBG (last 3)  Recent Labs    04/15/22 1636 04/15/22 2212 04/16/22 0807  GLUCAP 117* 97 90      Coagulation Profile: No results for input(s): "INR", "PROTIME" in the last 168 hours.   Radiology Studies: CT ABDOMEN PELVIS W CONTRAST  Result Date: 04/14/2022 CLINICAL DATA:  Abdominal pain, mental status changes, diarrhea, 3 watery bowel movements during EMS transport, sick for past couple days with cough, history MI, coronary artery disease post angioplasty, hypertension EXAM: CT ABDOMEN AND PELVIS  WITH CONTRAST TECHNIQUE: Multidetector CT imaging of the abdomen and pelvis was performed using the standard protocol following bolus administration of intravenous contrast. RADIATION DOSE REDUCTION: This exam was performed according to the departmental dose-optimization program which includes  automated exposure control, adjustment of the mA and/or kV according to patient size and/or use of iterative reconstruction technique. CONTRAST:  OMNIPAQUE IOHEXOL 300 MG/ML SOLN IV. No oral contrast. COMPARISON:  08/13/2017 FINDINGS: Lower chest: Hazy bibasilar infiltrates Hepatobiliary: Gallbladder and liver normal appearance Pancreas: Atrophic pancreas without mass Spleen: Atrophic spleen, otherwise unremarkable. Adrenals/Urinary Tract: Adrenal glands normal appearance. Kidneys, ureters, and bladder normal appearance. Stomach/Bowel: High density material within cecum. Appendix surgically absent by history. Stomach decompressed with inadequate assessment of gastric wall thickness. Transverse colon extends into an umbilical hernia without obstruction or wall thickening. Remaining bowel loops normal appearance. Vascular/Lymphatic: Extensive atherosclerotic calcifications aorta, iliac arteries, femoral arteries, coronary arteries. SMA calcification. Mitral annular calcification. No adenopathy. Reproductive: Uterus surgically absent. Atrophic RIGHT ovary. LEFT ovarian cyst 3.5 x 3.8 x 3.1 cm, simple appearing cyst increased since 2019 when it measured 1.2 cm greatest size; follow-up imaging recommended in 6-12 months. Other: No free air or free fluid. Umbilical hernia containing fat and a minimal segment of transverse colon without obstruction or wall thickening. Small RIGHT inguinal hernia containing fat. Musculoskeletal: Osseous demineralization with multilevel degenerative disc disease changes lumbar spine. IMPRESSION: Umbilical hernia containing fat and a minimal segment of transverse colon without obstruction or wall  thickening. Small RIGHT inguinal hernia containing fat. LEFT ovarian cyst 3.5 x 3.8 x 3.1 cm, simple appearing cyst increased since 2019 when it measured 1.2 cm greatest size; follow-up imaging recommended in 6-12 months. Extensive atherosclerotic calcifications including coronary arteries. Hazy bibasilar infiltrates. Aortic Atherosclerosis (ICD10-I70.0). Electronically Signed   By: Ulyses Southward M.D.   On: 04/14/2022 17:37   CT HEAD WO CONTRAST ( )  Result Date: 04/14/2022 CLINICAL DATA:  Altered mental status. EXAM: CT HEAD WITHOUT CONTRAST TECHNIQUE: Contiguous axial images were obtained from the base of the skull through the vertex without intravenous contrast. RADIATION DOSE REDUCTION: This exam was performed according to the departmental dose-optimization program which includes automated exposure control, adjustment of the mA and/or kV according to patient size and/or use of iterative reconstruction technique. COMPARISON:  CT Brain 02/04/22 FINDINGS: Brain: Overall there is no significant change compared to prior exam with unchanged appearance of the ventricular system, cerebral volume loss, and findings of chronic microvascular ischemic change. There is no evidence of a new infarct. No hemorrhage. Vascular: No hyperdense vessel or unexpected calcification. Skull: Normal. Negative for fracture or focal lesion. Sinuses/Orbits: Mucosal thickening bilateral ethmoid sinuses. Other: None. IMPRESSION: No acute intracranial abnormality. Electronically Signed   By: Lorenza Cambridge M.D.   On: 04/14/2022 17:32   DG Chest Portable 1 View  Result Date: 04/14/2022 CLINICAL DATA:  Weakness EXAM: PORTABLE CHEST 1 VIEW COMPARISON:  Chest x-ray dated February 05, 2022 FINDINGS: Cardiac and mediastinal contours within normal limits. Mitral annular. Bibasilar atelectasis. Lungs are otherwise clear. No evidence of pleural effusion or pneumothorax. IMPRESSION: No active disease. Electronically Signed   By: Allegra Lai  M.D.   On: 04/14/2022 16:10       Kathlen Mody M.D. [facility] 04/16/2022, 10:49 AM  Available via Epic secure chat 7am-7pm After 7 pm, please refer to night coverage provider listed on amion.

## 2022-04-17 DIAGNOSIS — E860 Dehydration: Secondary | ICD-10-CM | POA: Diagnosis not present

## 2022-04-17 DIAGNOSIS — R531 Weakness: Secondary | ICD-10-CM | POA: Diagnosis not present

## 2022-04-17 DIAGNOSIS — J101 Influenza due to other identified influenza virus with other respiratory manifestations: Secondary | ICD-10-CM | POA: Diagnosis not present

## 2022-04-17 LAB — BASIC METABOLIC PANEL
Anion gap: 7 (ref 5–15)
BUN: 34 mg/dL — ABNORMAL HIGH (ref 8–23)
CO2: 22 mmol/L (ref 22–32)
Calcium: 9.3 mg/dL (ref 8.9–10.3)
Chloride: 112 mmol/L — ABNORMAL HIGH (ref 98–111)
Creatinine, Ser: 1.31 mg/dL — ABNORMAL HIGH (ref 0.44–1.00)
GFR, Estimated: 39 mL/min — ABNORMAL LOW (ref >60)
Glucose, Bld: 149 mg/dL — ABNORMAL HIGH (ref 70–99)
Potassium: 3.8 mmol/L (ref 3.5–5.1)
Sodium: 141 mmol/L (ref 135–145)

## 2022-04-17 LAB — GLUCOSE, CAPILLARY
Glucose-Capillary: 122 mg/dL — ABNORMAL HIGH (ref 70–99)
Glucose-Capillary: 133 mg/dL — ABNORMAL HIGH (ref 70–99)

## 2022-04-17 MED ORDER — GUAIFENESIN-DM 100-10 MG/5ML PO SYRP: 10.0000 mL | ORAL_SOLUTION | ORAL | 0 refills | Status: DC | PRN

## 2022-04-17 MED ORDER — BENZONATATE 100 MG PO CAPS: 100.0000 mg | ORAL_CAPSULE | Freq: Three times a day (TID) | ORAL | 0 refills | Status: DC | PRN

## 2022-04-17 MED ORDER — PREDNISONE 20 MG PO TABS: 40.0000 mg | ORAL_TABLET | Freq: Every day | ORAL | 0 refills | Status: AC

## 2022-04-17 MED ORDER — AZITHROMYCIN 500 MG PO TABS: 500.0000 mg | ORAL_TABLET | Freq: Every day | ORAL | 0 refills | Status: AC

## 2022-04-17 MED ORDER — IPRATROPIUM-ALBUTEROL 0.5-2.5 (3) MG/3ML IN SOLN: 3.0000 mL | Freq: Four times a day (QID) | RESPIRATORY_TRACT | 5 refills | Status: AC | PRN

## 2022-04-17 MED ORDER — OSELTAMIVIR PHOSPHATE 30 MG PO CAPS: 30.0000 mg | ORAL_CAPSULE | Freq: Every day | ORAL | 0 refills | Status: AC

## 2022-04-17 MED ORDER — TORSEMIDE 20 MG PO TABS
20.0000 mg | ORAL_TABLET | Freq: Once | ORAL | Status: AC
  Administered 2022-04-17: 20 mg via ORAL
  Filled 2022-04-17: qty 1

## 2022-04-17 NOTE — Progress Notes (Signed)
Occupational Therapy Treatment Patient Details Name: [name] MRN: [id number] DOB: 1931/04/07 Today's Date: 04/17/2022   History of present illness [name] is an 89yoF who comes to [facility] on 12/26 c AMS and diarrhea, preceding says of illness. (+) Flu test; COVID negative. PMH: CAD s/p stent, HTN, HLD, DM, CKD, non-STEMI, COVID19, syncope.   OT comments  Upon entering the room, pt seated in recliner chair and agreeable to OT intervention. Pt performing sit <>stand with supervision from recliner chair. Pt ambulates with RW in tight space needing to side steps with increased cuing for technique. Pt sitting on commode and needing to change brief. Min guard for balance as pt doff/dons clean brief by leaning forwards to thread onto B feet. Pt is able to void further on commode and stands with min guard for LB clothing management. Pt returns back to recliner chair in same manner as above. OT reminds pt that purewick is no longer in place and RN agrees that pt should be ambulating with staff to bathroom. Call bell and all needed items within reach upon exiting the room. Pt making great progress towards goals.    Recommendations for follow up therapy are one component of a multi-disciplinary discharge planning process, led by the attending physician.  Recommendations may be updated based on patient status, additional functional criteria and insurance authorization.    Follow Up Recommendations  Home health OT     Assistance Recommended at Discharge Intermittent Supervision/Assistance  Patient can return home with the following  A little help with walking and/or transfers;A little help with bathing/dressing/bathroom;Assistance with cooking/housework;Help with stairs or ramp for entrance;Assist for transportation   Equipment Recommendations  None recommended by OT       Precautions / Restrictions Precautions Precautions: Fall Restrictions Weight Bearing Restrictions: No       Mobility  Bed Mobility               General bed mobility comments: Pt seated in recliner chair at beginning/end of session    Transfers Overall transfer level: Needs assistance Equipment used: Rolling walker (2 wheels) Transfers: Sit to/from Stand Sit to Stand: Supervision                 Balance Overall balance assessment: Needs assistance Sitting-balance support: Feet unsupported, Single extremity supported Sitting balance-Leahy Scale: Good     Standing balance support: Reliant on assistive device for balance, During functional activity, Bilateral upper extremity supported Standing balance-Leahy Scale: Fair                             ADL either performed or assessed with clinical judgement   ADL Overall ADL's : Needs assistance/impaired                     Lower Body Dressing: Supervision/safety;Min guard;Sit to/from stand   Toilet Transfer: Solicitor;Ambulation;Rolling walker (2 wheels)   Toileting- Clothing Manipulation and Hygiene: Min guard;Sit to/from stand       Functional mobility during ADLs: Supervision/safety;Rolling walker (2 wheels)      Extremity/Trunk Assessment Upper Extremity Assessment Upper Extremity Assessment: Generalized weakness   Lower Extremity Assessment Lower Extremity Assessment: Generalized weakness        Vision Patient Visual Report: No change from baseline            Cognition Arousal/Alertness: Awake/alert Behavior During Therapy: WFL for tasks assessed/performed Overall Cognitive Status: Within Functional Limits for tasks  assessed                                                     Pertinent Vitals/ Pain       Pain Assessment Pain Assessment: No/denies pain         Frequency  Min 2X/week        Progress Toward Goals  OT Goals(current goals can now be found in the care plan section)  Progress towards OT goals: Progressing toward goals  Acute Rehab  OT Goals Patient Stated Goal: to go home OT Goal Formulation: With patient Time For Goal Achievement: 04/29/22 Potential to Achieve Goals: Fair  Plan Discharge plan remains appropriate;Frequency remains appropriate       AM-PAC OT "6 Clicks" Daily Activity     Outcome Measure   Help from another person eating meals?: None Help from another person taking care of personal grooming?: None Help from another person toileting, which includes using toliet, bedpan, or urinal?: A Little Help from another person bathing (including washing, rinsing, drying)?: A Little Help from another person to put on and taking off regular upper body clothing?: None Help from another person to put on and taking off regular lower body clothing?: A Little 6 Click Score: 21    End of Session Equipment Utilized During Treatment: Rolling walker (2 wheels)  OT Visit Diagnosis: Unsteadiness on feet (R26.81);Muscle weakness (generalized) (M62.81)   Activity Tolerance Patient tolerated treatment well   Patient Left in chair;with chair alarm set;with nursing/sitter in room;with call bell/phone within reach   Nurse Communication Mobility status        Time: 5797-2820 OT Time Calculation (min): 18 min  Charges: OT General Charges $OT Visit: 1 Visit OT Treatments $Self Care/Home Management : 8-22 mins  Jackquline Denmark, MS, OTR/L , CBIS ascom [phone]  04/17/22, 11:48 AM

## 2022-04-17 NOTE — Plan of Care (Signed)

## 2022-04-17 NOTE — TOC Transition Note (Signed)
Transition of Care [facility]) - CM/SW Discharge Note   Patient Details  Name: FENNA SEMEL MRN: [id number] Date of Birth: Apr 01, 1931  [facility]) CM/SW Contact:  Liliana Cline, LCSW Phone Number: 04/17/2022, 2:39 PM   Clinical Narrative:    Patient to DC home today. Notified Cory with [facility]. Home Health orders are in.    Final next level of care: Home w Home Health Services Barriers to Discharge: Barriers Resolved   Patient Goals and CMS Choice CMS Medicare.gov Compare Post Acute Care list provided to:: Patient Represenative (must comment) (son)    Discharge Placement                         Discharge Plan and Services Additional resources added to the After Visit Summary for                            [facility] Arranged: PT, OT [facility] Agency: [facility] Date [facility] Agency Contacted: 04/17/22   Representative spoke with at [facility] Agency: Kandee Keen  Social Determinants of Health (SDOH) Interventions SDOH Screenings   Food Insecurity: No Food Insecurity (04/16/2022)  Housing: Low Risk  (04/16/2022)  Transportation Needs: No Transportation Needs (04/16/2022)  Utilities: Not At Risk (04/16/2022)  Depression (PHQ2-9): Low Risk  (06/20/2020)  Tobacco Use: Low Risk  (04/16/2022)     Readmission Risk Interventions     No data to display

## 2022-04-17 NOTE — Progress Notes (Signed)
Physical Therapy Treatment Patient Details Name: [name] MRN: [id number] DOB: 08/15/1930 Today's Date: 04/17/2022   History of Present Illness Shamra Bradeen is an 89yoF who comes to [facility] on 12/26 c AMS and diarrhea, preceding says of illness. (+) Flu test; COVID negative. PMH: CAD s/p stent, HTN, HLD, DM, CKD, non-STEMI, COVID19, syncope.    PT Comments    Pt in bed on entry agreeable to session. Supervision to minA to EOB, supervision for multiple transitions to standing. Pt tolerates several laps in room on room air, sats at 90% thereafter, no SOB. Pt has cough throughout session. Pt left up in chair at EOS, meal tray presented. DC to home still appropriate from PT standpoint. Will continue to follow. RN made aware of pt needs.   Recommendations for follow up therapy are one component of a multi-disciplinary discharge planning process, led by the attending physician.  Recommendations may be updated based on patient status, additional functional criteria and insurance authorization.  Follow Up Recommendations  Home health PT     Assistance Recommended at Discharge Frequent or constant Supervision/Assistance  Patient can return home with the following A little help with bathing/dressing/bathroom;A little help with walking and/or transfers;Assist for transportation;Help with stairs or ramp for entrance;Assistance with cooking/housework;Assistance with feeding   Equipment Recommendations  None recommended by PT    Recommendations for Other Services       Precautions / Restrictions Precautions Precautions: Fall Restrictions Weight Bearing Restrictions: No     Mobility  Bed Mobility Overal bed mobility: Needs Assistance Bed Mobility: Supine to Sit     Supine to sit: Min assist     General bed mobility comments: minA for forward scooting, but likely could have achieve with additional time and great effort.    Transfers Overall transfer level: Needs assistance Equipment  used: Rolling walker (2 wheels) Transfers: Sit to/from Stand Sit to Stand: Supervision                Ambulation/Gait Ambulation/Gait assistance: Min guard, Supervision Gait Distance (Feet): 80 Feet (distance limtied by author to prioritize vitals and pericare) Assistive device: Rolling walker (2 wheels) Gait Pattern/deviations: Step-to pattern       General Gait Details: no LOB, confidence good, pt reports to feel good to be up AMB. On room air, terminal sats 90%, denies SOB, WOB   Stairs             Wheelchair Mobility    Modified Rankin (Stroke Patients Only)       Balance                                            Cognition Arousal/Alertness: Awake/alert Behavior During Therapy: WFL for tasks assessed/performed Overall Cognitive Status: Within Functional Limits for tasks assessed                                          Exercises      General Comments        Pertinent Vitals/Pain Pain Assessment Pain Assessment: No/denies pain    Home Living                          Prior Function  PT Goals (current goals can now be found in the care plan section) Acute Rehab PT Goals Patient Stated Goal: Feel better, regain strength PT Goal Formulation: With patient Time For Goal Achievement: 04/29/22 Potential to Achieve Goals: Good Progress towards PT goals: Progressing toward goals    Frequency    Min 2X/week      PT Plan Current plan remains appropriate    Co-evaluation              AM-PAC PT "6 Clicks" Mobility   Outcome Measure  Help needed turning from your back to your side while in a flat bed without using bedrails?: A Little Help needed moving from lying on your back to sitting on the side of a flat bed without using bedrails?: A Little Help needed moving to and from a bed to a chair (including a wheelchair)?: A Little Help needed standing up from a chair using your  arms (e.g., wheelchair or bedside chair)?: A Little Help needed to walk in hospital room?: A Little Help needed climbing 3-5 steps with a railing? : A Little 6 Click Score: 18    End of Session Equipment Utilized During Treatment: Oxygen Activity Tolerance: Patient tolerated treatment well;No increased pain Patient left: in bed;with call bell/phone within reach;with nursing/sitter in room;with family/visitor present Nurse Communication: Mobility status PT Visit Diagnosis: Difficulty in walking, not elsewhere classified (R26.2);Other abnormalities of gait and mobility (R26.89);Muscle weakness (generalized) (M62.81)     Time: 0017-4944 PT Time Calculation (min) (ACUTE ONLY): 35 min  Charges:  $Gait Training: 23-37 mins                    9:08 AM, 04/17/22 Rosamaria Lints, PT, DPT Physical Therapist - [facility]  [phone] (ASCOM)   Romuald Mccaslin C 04/17/2022, 9:06 AM

## 2022-04-17 NOTE — TOC Progression Note (Signed)
Transition of Care [facility]) - Progression Note    Patient Details  Name: [name] MRN: [id number] Date of Birth: 02-24-1931  [facility] Care [facility]) CM/SW Contact  Liliana Cline, LCSW Phone Number: 04/17/2022, 12:21 PM  Clinical Narrative:    CSW notified by Barbara Cower with [facility] that patient will now be followed by [facility] at DC. TOC will notify [facility] when DC.     Barriers to Discharge: Continued Medical Work up  Expected Discharge Plan and Services       Living arrangements for the past 2 months: Single Family Home                                       Social Determinants of Health (SDOH) Interventions SDOH Screenings   Food Insecurity: No Food Insecurity (04/16/2022)  Housing: Low Risk  (04/16/2022)  Transportation Needs: No Transportation Needs (04/16/2022)  Utilities: Not At Risk (04/16/2022)  Depression (PHQ2-9): Low Risk  (06/20/2020)  Tobacco Use: Low Risk  (04/16/2022)    Readmission Risk Interventions     No data to display

## 2022-04-17 NOTE — Care Management Important Message (Signed)
Important Message  Patient Details  Name: [name] MRN: [id number] Date of Birth: [birth year]   Medicare Important Message Given:  Yes     Olegario Messier A Yoshio Seliga 04/17/2022, 1:23 PM

## 2022-04-19 LAB — CULTURE, BLOOD (SINGLE): Culture: NO GROWTH

## 2022-04-22 NOTE — Discharge Summary (Signed)
Physician Discharge Summary   Patient: [name] MRN: [id number] DOB: 20-Aug-1930  Admit date:     04/14/2022  Discharge date: 04/17/2022  Discharge Physician: Hosie Poisson   PCP: Rusty Aus, MD   Recommendations at discharge:  [place].  PLEASE CHECK CBC AND BMP IN ONE WEEK.   Discharge Diagnoses: Principal Problem:   General weakness    Hospital Course:  89 year old female with medical comorbidities that include coronary disease status post PCI, chronic kidney disease stage IIIa, hypertension, hyperlipidemia and diet-controlled diabetes. She presents with generalized weakness and diarrhea and found to be influenza A positive.    Assessment and Plan: Influenza positive infection Symptomatic management with fluids, Tamiflu Patient reports productive cough. Will start her on Zithromax for acute bronchitis Weaned her off oxygen and she was discharged on Tamiflu.  Generalized weakness secondary to viral infection Therapy evaluations ordered.       Acute kidney injury secondary to dehydration Gently hydrate and repeat renal parameters in the morning show improvement.        Diet-controlled diabetes mellitus Last A1c around 6.1        Diarrhea Resolved     History of coronary artery disease Patient currently denies any chest pain currently on aspirin and Plavix continue the same.     Mixed hyperlipidemia on statin continue the same     Estimated body mass index is 23.46 kg/m as calculated from the following:   Height as of this encounter: 5\' 4"  (1.626 m).   Weight as of this encounter: 62 kg.     Consultants: none.  Procedures performed: none.   Disposition: Home Diet recommendation:  Discharge Diet Orders (From admission, onward)     Start     Ordered   04/17/22 0000  Diet - low sodium heart healthy        04/17/22 1317           Regular diet DISCHARGE MEDICATION: Allergies as of 04/17/2022        Reactions   Butenafine Swelling   Tongue swelled and turned red   Levofloxacin Swelling, Other (See Comments)   Other reaction(s): Other (See Comments) Tongue swelled and turned red   Limonene Other (See Comments)   Other reaction(s): Other (See Comments) Tongue swelled and turned red Tongue swelled and turned red   Penicillins    Other reaction(s): Unknown   Terbinafine And Related Other (See Comments)   Tongue swelled and turned red        Medication List     STOP taking these medications    omeprazole 20 MG capsule Commonly known as: PRILOSEC   tizanidine 2 MG capsule Commonly known as: ZANAFLEX       TAKE these medications    amLODipine 10 MG tablet Commonly known as: NORVASC Take 1 tablet (10 mg total) by mouth daily.   aspirin EC 81 MG tablet Take 1 tablet (81 mg total) by mouth daily. Swallow whole.   benzonatate 100 MG capsule Commonly known as: TESSALON Take 1 capsule (100 mg total) by mouth 3 (three) times daily as needed for cough.   carboxymethylcellulose 0.5 % Soln Commonly known as: REFRESH PLUS 1 drop daily as needed.   cetirizine 5 MG tablet Commonly known as: ZYRTEC Take 5 mg by mouth daily.   clopidogrel 75 MG tablet Commonly known as: PLAVIX Take 75 mg by mouth daily.   Cranberry 400 MG Caps Take 1 capsule by mouth  2 (two) times daily.   diphenhydrAMINE 25 MG tablet Commonly known as: BENADRYL   docusate sodium 100 MG capsule Commonly known as: COLACE Take 2 capsules (200 mg total) by mouth 2 (two) times daily.   donepezil 10 MG tablet Commonly known as: ARICEPT Take 10 mg by mouth daily.   fluticasone 50 MCG/ACT nasal spray Commonly known as: FLONASE Place 1 spray into both nostrils daily.   guaiFENesin-dextromethorphan 100-10 MG/5ML syrup Commonly known as: ROBITUSSIN DM Take 10 mLs by mouth every 4 (four) hours as needed for cough.   ipratropium-albuterol 0.5-2.5 (3) MG/3ML Soln Commonly known as: DUONEB Take 3 mLs  by nebulization every 6 (six) hours as needed.   isosorbide mononitrate 60 MG 24 hr tablet Commonly known as: IMDUR Take 1 tablet (60 mg total) by mouth daily.   magnesium oxide 400 MG tablet Commonly known as: MAG-OX Take 400 mg by mouth daily.   MULTIVITAMIN/IRON PO Take 1 tablet by mouth daily.   nitrofurantoin 50 MG capsule Commonly known as: MACRODANTIN Take 50 mg by mouth 4 (four) times daily.   nitroGLYCERIN 0.4 MG SL tablet Commonly known as: NITROSTAT Place 0.4 mg under the tongue every 5 (five) minutes as needed. For chest pain   pantoprazole 40 MG tablet Commonly known as: PROTONIX Take 40 mg by mouth daily.   polyethylene glycol 17 g packet Commonly known as: MIRALAX / GLYCOLAX Take 17 g by mouth daily.   Pro-Stat Liqd Take 30 mLs by mouth 2 (two) times daily.   rosuvastatin 5 MG tablet Commonly known as: CRESTOR Take 5 mg by mouth daily.   timolol 0.5 % ophthalmic solution Commonly known as: TIMOPTIC Place 1 drop into both eyes at bedtime.   torsemide 20 MG tablet Commonly known as: DEMADEX   vitamin B-12 100 MCG tablet Commonly known as: CYANOCOBALAMIN Take 100 mcg by mouth daily.       ASK your doctor about these medications    azithromycin 500 MG tablet Commonly known as: ZITHROMAX Take 1 tablet (500 mg total) by mouth daily for 3 days. Ask about: Should I take this medication?   oseltamivir 30 MG capsule Commonly known as: TAMIFLU Take 1 capsule (30 mg total) by mouth daily for 2 days. Ask about: Should I take this medication?   predniSONE 20 MG tablet Commonly known as: DELTASONE Take 2 tablets (40 mg total) by mouth daily before breakfast for 3 days. Ask about: Should I take this medication?        Discharge Exam: Filed Weights   04/14/22 1501  Weight: 62 kg   General exam: Appears calm and comfortable  Respiratory system: Clear to auscultation. Respiratory effort normal. Cardiovascular system: S1 & S2 heard, RRR. No  JVD, murmurs, rubs, gallops or clicks. No pedal edema. Gastrointestinal system: Abdomen is nondistended, soft and nontender. No organomegaly or masses felt. Normal bowel sounds heard. Central nervous system: Alert and oriented. No focal neurological deficits. Extremities: Symmetric 5 x 5 power. Skin: No rashes, lesions or ulcers Psychiatry: Judgement and insight appear normal. Mood & affect appropriate.    Condition at discharge: fair  The results of significant diagnostics from this hospitalization (including imaging, microbiology, ancillary and laboratory) are listed below for reference.   Imaging Studies: CT ABDOMEN PELVIS W CONTRAST  Result Date: 04/14/2022 CLINICAL DATA:  Abdominal pain, mental status changes, diarrhea, 3 watery bowel movements during EMS transport, sick for past couple days with cough, history MI, coronary artery disease post angioplasty, hypertension EXAM: CT  ABDOMEN AND PELVIS WITH CONTRAST TECHNIQUE: Multidetector CT imaging of the abdomen and pelvis was performed using the standard protocol following bolus administration of intravenous contrast. RADIATION DOSE REDUCTION: This exam was performed according to the departmental dose-optimization program which includes automated exposure control, adjustment of the mA and/or kV according to patient size and/or use of iterative reconstruction technique. CONTRAST:  OMNIPAQUE IOHEXOL 300 MG/ML SOLN IV. No oral contrast. COMPARISON:  08/13/2017 FINDINGS: Lower chest: Hazy bibasilar infiltrates Hepatobiliary: Gallbladder and liver normal appearance Pancreas: Atrophic pancreas without mass Spleen: Atrophic spleen, otherwise unremarkable. Adrenals/Urinary Tract: Adrenal glands normal appearance. Kidneys, ureters, and bladder normal appearance. Stomach/Bowel: High density material within cecum. Appendix surgically absent by history. Stomach decompressed with inadequate assessment of gastric wall thickness. Transverse colon extends  into an umbilical hernia without obstruction or wall thickening. Remaining bowel loops normal appearance. Vascular/Lymphatic: Extensive atherosclerotic calcifications aorta, iliac arteries, femoral arteries, coronary arteries. SMA calcification. Mitral annular calcification. No adenopathy. Reproductive: Uterus surgically absent. Atrophic RIGHT ovary. LEFT ovarian cyst 3.5 x 3.8 x 3.1 cm, simple appearing cyst increased since 2019 when it measured 1.2 cm greatest size; follow-up imaging recommended in 6-12 months. Other: No free air or free fluid. Umbilical hernia containing fat and a minimal segment of transverse colon without obstruction or wall thickening. Small RIGHT inguinal hernia containing fat. Musculoskeletal: Osseous demineralization with multilevel degenerative disc disease changes lumbar spine. IMPRESSION: Umbilical hernia containing fat and a minimal segment of transverse colon without obstruction or wall thickening. Small RIGHT inguinal hernia containing fat. LEFT ovarian cyst 3.5 x 3.8 x 3.1 cm, simple appearing cyst increased since 2019 when it measured 1.2 cm greatest size; follow-up imaging recommended in 6-12 months. Extensive atherosclerotic calcifications including coronary arteries. Hazy bibasilar infiltrates. Aortic Atherosclerosis (ICD10-I70.0). Electronically Signed   By: Ulyses Southward M.D.   On: 04/14/2022 17:37   CT HEAD WO CONTRAST ( )  Result Date: 04/14/2022 CLINICAL DATA:  Altered mental status. EXAM: CT HEAD WITHOUT CONTRAST TECHNIQUE: Contiguous axial images were obtained from the base of the skull through the vertex without intravenous contrast. RADIATION DOSE REDUCTION: This exam was performed according to the departmental dose-optimization program which includes automated exposure control, adjustment of the mA and/or kV according to patient size and/or use of iterative reconstruction technique. COMPARISON:  CT Brain 02/04/22 FINDINGS: Brain: Overall there is no significant  change compared to prior exam with unchanged appearance of the ventricular system, cerebral volume loss, and findings of chronic microvascular ischemic change. There is no evidence of a new infarct. No hemorrhage. Vascular: No hyperdense vessel or unexpected calcification. Skull: Normal. Negative for fracture or focal lesion. Sinuses/Orbits: Mucosal thickening bilateral ethmoid sinuses. Other: None. IMPRESSION: No acute intracranial abnormality. Electronically Signed   By: Lorenza Cambridge M.D.   On: 04/14/2022 17:32   DG Chest Portable 1 View  Result Date: 04/14/2022 CLINICAL DATA:  Weakness EXAM: PORTABLE CHEST 1 VIEW COMPARISON:  Chest x-ray dated February 05, 2022 FINDINGS: Cardiac and mediastinal contours within normal limits. Mitral annular. Bibasilar atelectasis. Lungs are otherwise clear. No evidence of pleural effusion or pneumothorax. IMPRESSION: No active disease. Electronically Signed   By: Allegra Lai M.D.   On: 04/14/2022 16:10    Microbiology: Results for orders placed or performed during the hospital encounter of 04/14/22  Resp panel by RT-PCR (RSV, Flu A&B, Covid) Anterior Nasal Swab     Status: Abnormal   Collection Time: 04/14/22  2:57 PM   Specimen: Anterior Nasal Swab  Result Value Ref Range  Status   SARS Coronavirus 2 by RT PCR NEGATIVE NEGATIVE Final    Comment: (NOTE) SARS-CoV-2 target nucleic acids are NOT DETECTED.  The SARS-CoV-2 RNA is generally detectable in upper respiratory specimens during the acute phase of infection. The lowest concentration of SARS-CoV-2 viral copies this assay can detect is 138 copies/mL. A negative result does not preclude SARS-Cov-2 infection and should not be used as the sole basis for treatment or other patient management decisions. A negative result may occur with  improper specimen collection/handling, submission of specimen other than nasopharyngeal swab, presence of viral mutation(s) within the areas targeted by this assay, and  inadequate number of viral copies(<138 copies/mL). A negative result must be combined with clinical observations, patient history, and epidemiological information. The expected result is Negative.  Fact Sheet for Patients:  EntrepreneurPulse.com.au  Fact Sheet for Healthcare Providers:  IncredibleEmployment.be  This test is no t yet approved or cleared by the [place] FDA and  has been authorized for detection and/or diagnosis of SARS-CoV-2 by FDA under an Emergency Use Authorization (EUA). This EUA will remain  in effect (meaning this test can be used) for the duration of the COVID-19 declaration under Section 564(b)(1) of the Act, 21 U.S.C.section 360bbb-3(b)(1), unless the authorization is terminated  or revoked sooner.       Influenza A by PCR POSITIVE (A) NEGATIVE Final   Influenza B by PCR NEGATIVE NEGATIVE Final    Comment: (NOTE) The Xpert Xpress SARS-CoV-2/FLU/RSV plus assay is intended as an aid in the diagnosis of influenza from Nasopharyngeal swab specimens and should not be used as a sole basis for treatment. Nasal washings and aspirates are unacceptable for Xpert Xpress SARS-CoV-2/FLU/RSV testing.  Fact Sheet for Patients: EntrepreneurPulse.com.au  Fact Sheet for Healthcare Providers: IncredibleEmployment.be  This test is not yet approved or cleared by the [place] FDA and has been authorized for detection and/or diagnosis of SARS-CoV-2 by FDA under an Emergency Use Authorization (EUA). This EUA will remain in effect (meaning this test can be used) for the duration of the COVID-19 declaration under Section 564(b)(1) of the Act, 21 U.S.C. section 360bbb-3(b)(1), unless the authorization is terminated or revoked.     Resp Syncytial Virus by PCR NEGATIVE NEGATIVE Final    Comment: (NOTE) Fact Sheet for Patients: EntrepreneurPulse.com.au  Fact Sheet for  Healthcare Providers: IncredibleEmployment.be  This test is not yet approved or cleared by the [place] FDA and has been authorized for detection and/or diagnosis of SARS-CoV-2 by FDA under an Emergency Use Authorization (EUA). This EUA will remain in effect (meaning this test can be used) for the duration of the COVID-19 declaration under Section 564(b)(1) of the Act, 21 U.S.C. section 360bbb-3(b)(1), unless the authorization is terminated or revoked.  Performed at [facility], [address] [place], [place] [address]   Blood culture (single)     Status: None   Collection Time: 04/14/22  5:36 PM   Specimen: BLOOD  Result Value Ref Range Status   Specimen Description BLOOD BLOOD RIGHT ARM  Final   Special Requests   Final    BOTTLES DRAWN AEROBIC AND ANAEROBIC Blood Culture results may not be optimal due to an inadequate volume of blood received in culture bottles   Culture   Final    NO GROWTH 5 DAYS Performed at [facility], [address] [place], [place] [address]    Report Status 04/19/2022 FINAL  Final    Labs: CBC: No results for input(s): "WBC", "NEUTROABS", "  HGB", "HCT", "MCV", "PLT" in the last 168 hours. Basic Metabolic Panel: Recent Labs  Lab 04/17/22 0607  NA 141  K 3.8  CL 112*  CO2 22  GLUCOSE 149*  BUN 34*  CREATININE 1.31*  CALCIUM 9.3   Liver Function Tests: No results for input(s): "AST", "ALT", "ALKPHOS", "BILITOT", "PROT", "ALBUMIN" in the last 168 hours. CBG: Recent Labs  Lab 04/16/22 1210 04/16/22 1607 04/16/22 2114 04/17/22 0836 04/17/22 1150  GLUCAP 171* 282* 214* 122* 133*    Discharge time spent: 42 minutes.   Signed: Kathlen Mody, MD [facility]

## 2022-10-15 ENCOUNTER — Encounter: Payer: Self-pay | Admitting: Ophthalmology

## 2022-10-16 ENCOUNTER — Encounter: Payer: Self-pay | Admitting: Ophthalmology

## 2022-10-16 NOTE — Anesthesia Preprocedure Evaluation (Addendum)
Anesthesia Evaluation  Patient identified by MRN, date of birth, ID band Patient awake    Reviewed: Allergy & Precautions, H&P , NPO status , Patient's Chart, lab work & pertinent test results  Airway Mallampati: II  TM Distance: >3 FB Neck ROM: Full    Dental no notable dental hx.    Pulmonary neg pulmonary ROS   Pulmonary exam normal breath sounds clear to auscultation       Cardiovascular hypertension, + CAD and + Past MI  Normal cardiovascular exam+ dysrhythmias  Rhythm:Regular Rate:Normal  02-06-22  1. Left ventricular ejection fraction, by estimation, is 55 to 60%. The  left ventricle has normal function. The left ventricle has no regional  wall motion abnormalities. There is mild left ventricular hypertrophy.  Left ventricular diastolic parameters  are consistent with Grade I diastolic dysfunction (impaired relaxation).   2. Right ventricular systolic function is normal. The right ventricular  size is normal.   3. Left atrial size was mildly dilated.   4. Right atrial size was mildly dilated.   5. The mitral valve is degenerative. Trivial mitral valve regurgitation.  Severe mitral annular calcification.   6. The aortic valve is calcified. Aortic valve regurgitation is mild.  Aortic valve sclerosis/calcification is present, without any evidence of  aortic stenosis.     Neuro/Psych  PSYCHIATRIC DISORDERS     Dementia negative neurological ROS     GI/Hepatic Neg liver ROS,GERD  ,,  Endo/Other  diabetes    Renal/GU Renal disease  negative genitourinary   Musculoskeletal negative musculoskeletal ROS (+)    Abdominal   Peds negative pediatric ROS (+)  Hematology negative hematology ROS (+) Blood dyscrasia, anemia   Anesthesia Other Findings S/P angioplasty with stent 2021 Myocardial infarction [facility]) 2008 Hypertension Hypercholesteremia Memory changes  Wears hearing aid in both ears     Reproductive/Obstetrics negative OB ROS                             Anesthesia Physical Anesthesia Plan  ASA: 3  Anesthesia Plan:    Post-op Pain Management:    Induction:   PONV Risk Score and Plan:   Airway Management Planned:   Additional Equipment:   Intra-op Plan:   Post-operative Plan:   Informed Consent:   Plan Discussed with:   Anesthesia Plan Comments:        Anesthesia Quick Evaluation

## 2022-10-20 NOTE — Discharge Instructions (Signed)

## 2022-10-24 ENCOUNTER — Emergency Department: Payer: Medicare Other

## 2022-10-24 ENCOUNTER — Other Ambulatory Visit: Payer: Self-pay

## 2022-10-24 ENCOUNTER — Emergency Department
Admission: EM | Admit: 2022-10-24 | Discharge: 2022-10-24 | Disposition: A | Discharge status: Home / Self Care | Payer: Medicare Other | Attending: Emergency Medicine | Admitting: Emergency Medicine

## 2022-10-24 DIAGNOSIS — I129 Hypertensive chronic kidney disease with stage 1 through stage 4 chronic kidney disease, or unspecified chronic kidney disease: Secondary | ICD-10-CM | POA: Diagnosis not present

## 2022-10-24 DIAGNOSIS — E1122 Type 2 diabetes mellitus with diabetic chronic kidney disease: Secondary | ICD-10-CM | POA: Diagnosis not present

## 2022-10-24 DIAGNOSIS — I2510 Atherosclerotic heart disease of native coronary artery without angina pectoris: Secondary | ICD-10-CM | POA: Insufficient documentation

## 2022-10-24 DIAGNOSIS — R55 Syncope and collapse: Secondary | ICD-10-CM | POA: Diagnosis not present

## 2022-10-24 DIAGNOSIS — N189 Chronic kidney disease, unspecified: Secondary | ICD-10-CM | POA: Diagnosis not present

## 2022-10-24 LAB — URINALYSIS, ROUTINE W REFLEX MICROSCOPIC
Bilirubin Urine: NEGATIVE
Glucose, UA: NEGATIVE mg/dL
Hgb urine dipstick: NEGATIVE
Ketones, ur: NEGATIVE mg/dL
Leukocytes,Ua: NEGATIVE
Nitrite: NEGATIVE
Protein, ur: NEGATIVE mg/dL
Specific Gravity, Urine: 1.008 (ref 1.005–1.030)
pH: 7 (ref 5.0–8.0)

## 2022-10-24 LAB — CBC
HCT: 37 % (ref 36.0–46.0)
Hemoglobin: 12.3 g/dL (ref 12.0–15.0)
MCH: 32.8 pg (ref 26.0–34.0)
MCHC: 33.2 g/dL (ref 30.0–36.0)
MCV: 98.7 fL (ref 80.0–100.0)
Platelets: 305 10*3/uL (ref 150–400)
RBC: 3.75 MIL/uL — ABNORMAL LOW (ref 3.87–5.11)
RDW: 13.2 % (ref 11.5–15.5)
WBC: 9.4 10*3/uL (ref 4.0–10.5)
nRBC: 0 % (ref 0.0–0.2)

## 2022-10-24 LAB — BASIC METABOLIC PANEL
Anion gap: 13 (ref 5–15)
BUN: 27 mg/dL — ABNORMAL HIGH (ref 8–23)
CO2: 24 mmol/L (ref 22–32)
Calcium: 9.4 mg/dL (ref 8.9–10.3)
Chloride: 101 mmol/L (ref 98–111)
Creatinine, Ser: 1.29 mg/dL — ABNORMAL HIGH (ref 0.44–1.00)
GFR, Estimated: 40 mL/min — ABNORMAL LOW (ref >60)
Glucose, Bld: 126 mg/dL — ABNORMAL HIGH (ref 70–99)
Potassium: 3.5 mmol/L (ref 3.5–5.1)
Sodium: 138 mmol/L (ref 135–145)

## 2022-10-24 LAB — TROPONIN I (HIGH SENSITIVITY): Troponin I (High Sensitivity): 11 ng/L (ref <18)

## 2022-10-24 LAB — CBG MONITORING, ED: Glucose-Capillary: 120 mg/dL — ABNORMAL HIGH (ref 70–99)

## 2022-10-24 NOTE — ED Provider Notes (Signed)
[facility] Provider Note   Event Date/Time   First MD Initiated Contact with Patient 10/24/22 1738     (approximate) History  Loss of Consciousness  HPI [name] is a 89 y.o. female with a past medical history of hypertension, CAD, type 2 diabetes, chronic kidney disease, and recurrent syncope who presents complaining of an episode of syncope in which she sat down on the couch and passed out for approximately 2 minutes.  This episode is witnessed by son who is at bedside and states that she only complained of feeling tired before sitting down, "her eyes rolled back in her head and she was asleep for about 2 minutes".  Patient returned to baseline within 5 minutes of awakening and has had no subsequent syncopal episodes.  Patient denies any preceding symptoms such as headache, palpitations, chest pain, or shortness of breath. ROS: Patient currently denies any vision changes, tinnitus, difficulty speaking, facial droop, sore throat, chest pain, shortness of breath, abdominal pain, nausea/vomiting/diarrhea, dysuria, or weakness/numbness/paresthesias in any extremity   Physical Exam  Triage Vital Signs: ED Triage Vitals  Enc Vitals Group     BP 10/24/22 1327 (!) 183/62     Pulse Rate 10/24/22 1327 78     Resp 10/24/22 1327 18     Temp 10/24/22 1327 98.1 F (36.7 C)     Temp Source 10/24/22 1327 Oral     SpO2 10/24/22 1327 100 %     Weight --      Height --      Head Circumference --      Peak Flow --      Pain Score 10/24/22 1328 0     Pain Loc --      Pain Edu? --      Excl. in GC? --    Most recent vital signs: Vitals:   10/24/22 1711 10/24/22 1941  BP: (!) 181/55 (!) 182/56  Pulse: 81 71  Resp: 18 16  Temp: 97.9 F (36.6 C)   SpO2: 98% 97%   General: Awake, oriented x4. CV:  Good peripheral perfusion.  Resp:  Normal effort.  Abd:  No distention.  Other:  Elderly overweight Caucasian female laying in bed in no acute distress ED Results /  Procedures / Treatments  Labs (all labs ordered are listed, but only abnormal results are displayed) Labs Reviewed  BASIC METABOLIC PANEL - Abnormal; Notable for the following components:      Result Value   Glucose, Bld 126 (*)    BUN 27 (*)    Creatinine, Ser 1.29 (*)    GFR, Estimated 40 (*)    All other components within normal limits  CBC - Abnormal; Notable for the following components:   RBC 3.75 (*)    All other components within normal limits  URINALYSIS, ROUTINE W REFLEX MICROSCOPIC - Abnormal; Notable for the following components:   Color, Urine STRAW (*)    APPearance CLEAR (*)    All other components within normal limits  CBG MONITORING, ED - Abnormal; Notable for the following components:   Glucose-Capillary 120 (*)    All other components within normal limits  TROPONIN I (HIGH SENSITIVITY)  TROPONIN I (HIGH SENSITIVITY)   EKG ED ECG REPORT I, Merwyn Katos, the attending physician, personally viewed and interpreted this ECG. Date: 10/24/2022 EKG Time: 1333 Rate: 76 Rhythm: normal sinus rhythm QRS Axis: normal Intervals: Left bundle branch block ST/T Wave abnormalities: normal Narrative Interpretation: Left bundle  branch block.  No evidence of acute ischemia RADIOLOGY ED MD interpretation: One-view portable chest x-ray interpreted by me shows no evidence of acute abnormalities including no pneumonia, pneumothorax, or widened mediastinum -Agree with radiology assessment Official radiology report(s): DG Chest Port 1 View  Result Date: 10/24/2022 CLINICAL DATA:  Chest pain.  Syncope. EXAM: PORTABLE CHEST 1 VIEW COMPARISON:  04/14/2022 FINDINGS: The lungs are clear without focal pneumonia, edema, pneumothorax or pleural effusion. Cardiopericardial silhouette is at upper limits of normal for size. No acute bony abnormality. IMPRESSION: No active disease. Electronically Signed   By: Kennith Center M.D.   On: 10/24/2022 18:47   PROCEDURES: Critical Care performed:  No .1-3 Lead EKG Interpretation  Performed by: Merwyn Katos, MD Authorized by: Merwyn Katos, MD     Interpretation: normal     ECG rate:  71   ECG rate assessment: normal     Rhythm: sinus rhythm     Ectopy: none     Conduction: normal    MEDICATIONS ORDERED IN ED: Medications - No data to display IMPRESSION / MDM / ASSESSMENT AND PLAN / ED COURSE  I reviewed the triage vital signs and the nursing notes.                             The patient is on the cardiac monitor to evaluate for evidence of arrhythmia and/or significant heart rate changes. Patient's presentation is most consistent with acute presentation with potential threat to life or bodily function. Patient presents with complaints of syncope/presyncope ED Workup:  CBC, BMP, Troponin, ECG, CXR Differential diagnosis includes HF, ICH, seizure, stroke, HOCM, ACS, aortic dissection, malignant arrhythmia, or GI bleed. Findings: No evidence of acute laboratory abnormalities.  Troponin negative x1 EKG: No e/o STEMI. No evidence of Brugadas sign, delta wave, epsilon wave, significantly prolonged QTc, or malignant arrhythmia.  Disposition: Discharge. Patient is at baseline at this time. Return precautions expressed and understood in person. Advised follow up with primary care provider or clinic physician in next 24 hours.   FINAL CLINICAL IMPRESSION(S) / ED DIAGNOSES   Final diagnoses:  Syncope and collapse   Rx / DC Orders   ED Discharge Orders          Ordered    Ambulatory referral to Cardiology       Comments: If you have not heard from the Cardiology office within the next 72 hours please call [phone].   10/24/22 1932           Note:  This document was prepared using Dragon voice recognition software and may include unintentional dictation errors.   Merwyn Katos, MD 10/24/22 2040

## 2022-10-24 NOTE — ED Triage Notes (Signed)
Pt states she got tired and sat down, woke up to son telling her the ambulance was coming and she had passed out. Pt denies pain, SHOB, N/V, dizziness. Pt AOX4, NAD noted. Pt reports feeling fatigued.

## 2022-10-24 NOTE — ED Notes (Signed)
First Nurse Note: Pt to ED via [facility] from home for syncope. Per EMS pt was sitting in a chair and had a syncopal episode that lasted 2-3 minutes. Pt is c/o generalized weakness. Pt is in NAD.

## 2022-10-26 ENCOUNTER — Ambulatory Visit
Admission: RE | Admit: 2022-10-26 | Discharge: 2022-10-26 | Disposition: A | Discharge status: Home / Self Care | Payer: Medicare Other | Attending: Ophthalmology | Admitting: Ophthalmology

## 2022-10-26 ENCOUNTER — Encounter
Admission: RE | Disposition: A | Discharge status: Home / Self Care | Payer: Self-pay | Source: Home / Self Care | Attending: Ophthalmology

## 2022-10-26 ENCOUNTER — Ambulatory Visit: Payer: Medicare Other | Admitting: Anesthesiology

## 2022-10-26 ENCOUNTER — Other Ambulatory Visit: Payer: Self-pay

## 2022-10-26 DIAGNOSIS — I252 Old myocardial infarction: Secondary | ICD-10-CM | POA: Diagnosis not present

## 2022-10-26 DIAGNOSIS — K219 Gastro-esophageal reflux disease without esophagitis: Secondary | ICD-10-CM | POA: Insufficient documentation

## 2022-10-26 DIAGNOSIS — Z955 Presence of coronary angioplasty implant and graft: Secondary | ICD-10-CM | POA: Diagnosis not present

## 2022-10-26 DIAGNOSIS — E1136 Type 2 diabetes mellitus with diabetic cataract: Secondary | ICD-10-CM | POA: Diagnosis not present

## 2022-10-26 DIAGNOSIS — H2181 Floppy iris syndrome: Secondary | ICD-10-CM | POA: Diagnosis not present

## 2022-10-26 DIAGNOSIS — I10 Essential (primary) hypertension: Secondary | ICD-10-CM | POA: Diagnosis not present

## 2022-10-26 DIAGNOSIS — F0390 Unspecified dementia without behavioral disturbance: Secondary | ICD-10-CM | POA: Diagnosis not present

## 2022-10-26 DIAGNOSIS — I2510 Atherosclerotic heart disease of native coronary artery without angina pectoris: Secondary | ICD-10-CM | POA: Diagnosis not present

## 2022-10-26 DIAGNOSIS — H2512 Age-related nuclear cataract, left eye: Secondary | ICD-10-CM | POA: Diagnosis present

## 2022-10-26 HISTORY — DX: Type 2 diabetes mellitus with diabetic neuropathy, unspecified: E11.40

## 2022-10-26 HISTORY — DX: Gastroduodenitis, unspecified, without bleeding: K29.90

## 2022-10-26 HISTORY — DX: Personal history of other specified conditions: Z87.898

## 2022-10-26 HISTORY — DX: Other amnesia: R41.3

## 2022-10-26 HISTORY — DX: Occlusion and stenosis of bilateral carotid arteries: I65.23

## 2022-10-26 HISTORY — DX: Nonrheumatic aortic (valve) insufficiency: I35.1

## 2022-10-26 HISTORY — DX: Other ill-defined heart diseases: I51.89

## 2022-10-26 HISTORY — DX: Bradycardia, unspecified: R00.1

## 2022-10-26 HISTORY — DX: Presence of external hearing-aid: Z97.4

## 2022-10-26 HISTORY — DX: Left bundle-branch block, unspecified: I44.7

## 2022-10-26 HISTORY — PX: CATARACT EXTRACTION W/PHACO: SHX586

## 2022-10-26 SURGERY — PHACOEMULSIFICATION, CATARACT, WITH IOL INSERTION
Anesthesia: Topical | Site: Eye | Laterality: Left

## 2022-10-26 MED ORDER — LIDOCAINE HCL (PF) 2 % IJ SOLN
INTRAOCULAR | Status: DC | PRN
  Administered 2022-10-26: 1 mL via INTRAOCULAR

## 2022-10-26 MED ORDER — FENTANYL CITRATE (PF) 100 MCG/2ML IJ SOLN
INTRAMUSCULAR | Status: DC | PRN
  Administered 2022-10-26: 25 ug via INTRAVENOUS

## 2022-10-26 MED ORDER — SIGHTPATH DOSE#1 BSS IO SOLN
INTRAOCULAR | Status: DC | PRN
  Administered 2022-10-26: 15 mL

## 2022-10-26 MED ORDER — SODIUM CHLORIDE 0.9% FLUSH
INTRAVENOUS | Status: DC | PRN
  Administered 2022-10-26: 10 mL via INTRAVENOUS

## 2022-10-26 MED ORDER — TETRACAINE HCL 0.5 % OP SOLN
1.0000 [drp] | OPHTHALMIC | Status: DC | PRN
  Administered 2022-10-26 (×3): 1 [drp] via OPHTHALMIC

## 2022-10-26 MED ORDER — SIGHTPATH DOSE#1 BSS IO SOLN
INTRAOCULAR | Status: DC | PRN
  Administered 2022-10-26: 105 mL via OPHTHALMIC

## 2022-10-26 MED ORDER — ARMC OPHTHALMIC DILATING DROPS
1.0000 | OPHTHALMIC | Status: DC | PRN
  Administered 2022-10-26 (×3): 1 via OPHTHALMIC

## 2022-10-26 MED ORDER — SIGHTPATH DOSE#1 NA HYALUR & NA CHOND-NA HYALUR IO KIT
PACK | INTRAOCULAR | Status: DC | PRN
  Administered 2022-10-26: 1 via OPHTHALMIC

## 2022-10-26 MED ORDER — MIDAZOLAM HCL 2 MG/2ML IJ SOLN
INTRAMUSCULAR | Status: DC | PRN
  Administered 2022-10-26 (×2): .5 mg via INTRAVENOUS

## 2022-10-26 MED ORDER — LACTATED RINGERS IV SOLN: INTRAVENOUS | Status: DC

## 2022-10-26 MED ORDER — POLYMYXIN B-TRIMETHOPRIM 10000-0.1 UNIT/ML-% OP SOLN
OPHTHALMIC | Status: DC | PRN
  Administered 2022-10-26: 1 [drp] via OPHTHALMIC

## 2022-10-26 SURGICAL SUPPLY — 12 items
CATARACT SUITE SIGHTPATH (MISCELLANEOUS) ×1 IMPLANT
DISSECTOR HYDRO NUCLEUS 50X22 (MISCELLANEOUS) ×1 IMPLANT
FEE CATARACT SUITE SIGHTPATH (MISCELLANEOUS) ×1 IMPLANT
GLOVE SURG GAMMEX PI TX LF 7.5 (GLOVE) ×1 IMPLANT
GLOVE SURG SYN 8.5 E (GLOVE) ×1 IMPLANT
GLOVE SURG SYN 8.5 PF PI (GLOVE) ×1 IMPLANT
LENS IOL TECNIS EYHANCE 20.0 (Intraocular Lens) IMPLANT
NDL FILTER BLUNT 18X1 1/2 (NEEDLE) ×1 IMPLANT
NEEDLE FILTER BLUNT 18X1 1/2 (NEEDLE) ×1 IMPLANT
RING MALYGIN (MISCELLANEOUS) IMPLANT
SYR 3ML LL SCALE MARK (SYRINGE) ×1 IMPLANT
SYR 5ML LL (SYRINGE) ×1 IMPLANT

## 2022-10-26 NOTE — Op Note (Addendum)
OPERATIVE NOTE  [name] [id number] 10/26/2022   PREOPERATIVE DIAGNOSIS:  Nuclear sclerotic cataract left eye.  H25.12   POSTOPERATIVE DIAGNOSIS:      Nuclear sclerotic cataract left eye.   Intraoperative floppy iris syndrome.   PROCEDURE:  CPT [id number] Complex Phacoemusification with posterior chamber intraocular lens placement of the left eye, requiring placement of a malyugin ring for dilation and stabilization of the iris.  LENS:   Implant Name Type Inv. Item Serial No. Manufacturer Lot No. LRB No. Used Action  LENS IOL TECNIS EYHANCE 20.0 - [id number] Intraocular Lens LENS IOL TECNIS EYHANCE 20.0 [id number] [employer]  Left 1 Implanted      Procedure(s): CATARACT EXTRACTION PHACO AND INTRAOCULAR LENS PLACEMENT (IOC) LEFT DIABETIC  17.25  01:21.7 (Left)  DIB00 +20.0   ULTRASOUND TIME: 1 minutes 21 seconds.  CDE 17.25   SURGEON:  Willey Blade, MD, MPH   ANESTHESIA:  Topical with tetracaine drops augmented with 1% preservative-free intracameral lidocaine.  ESTIMATED BLOOD LOSS: <1 mL   COMPLICATIONS:  None.   DESCRIPTION OF PROCEDURE:  The patient was identified in the holding room and transported to the operating room and placed in the supine position under the operating microscope.  The left eye was identified as the operative eye and it was prepped and draped in the usual sterile ophthalmic fashion.   A 1.0 millimeter clear-corneal paracentesis was made at the 5:00 position. 0.5 ml of preservative-free 1% lidocaine with epinephrine was injected into the anterior chamber.  The anterior chamber was filled with viscoelastic.  A 2.4 millimeter keratome was used to make a near-clear corneal incision at the 2:00 position.    The pupil was small so a malyugin ring was placed.  A curvilinear capsulorrhexis was made with a cystotome and capsulorrhexis forceps.  Balanced salt solution was used to hydrodissect and hydrodelineate the nucleus.   Phacoemulsification was then used in  stop and chop fashion to remove the lens nucleus and epinucleus.  The lens was very dense and required significant ultrasound energy to remove.  The remaining cortex was then removed using the irrigation and aspiration handpiece. Viscoelastic was then placed into the capsular bag to distend it for lens placement.  A lens was then injected into the capsular bag.      The malyugin ring was removed. The remaining viscoelastic was aspirated.   Wounds were hydrated with balanced salt solution.  The anterior chamber was inflated to a physiologic pressure with balanced salt solution.  The patient has allergies to penicillin and vigamox, so no intracameral drops were used.  Polytrim drops were placed.   No wound leaks were noted.  The patient was taken to the recovery room in stable condition without complications of anesthesia or surgery  Willey Blade 10/26/2022, 8:32 AM

## 2022-10-26 NOTE — H&P (Signed)
[facility]   Primary Care Physician:  Danella Penton, MD Ophthalmologist: Dr. Willey Blade  Pre-Procedure History & Physical: HPI:  [name] is a 89 y.o. female here for cataract surgery.   Past Medical History:  Diagnosis Date   Bilateral carotid artery stenosis    Gastritis and duodenitis    Grade I diastolic dysfunction    History of syncope    Hypercholesteremia    Hypertension    Left bundle branch block (LBBB)    Memory changes    Mild aortic regurgitation    Myocardial infarction (HCC) 04/20/2006   S/P angioplasty with stent    Symptomatic sinus bradycardia    Type 2 diabetes mellitus with diabetic neuropathy (HCC)    Wears hearing aid in both ears    Has.  does not usually wear    Past Surgical History:  Procedure Laterality Date   ABDOMINAL HYSTERECTOMY     APPENDECTOMY     CORONARY ANGIOPLASTY WITH STENT PLACEMENT     CORONARY STENT INTERVENTION N/A 12/12/2019   Procedure: CORONARY STENT INTERVENTION;  Surgeon: Marcina Millard, MD;  Location: [facility] INVASIVE CV LAB;  Service: Cardiovascular;  Laterality: N/A;   LEFT HEART CATH AND CORONARY ANGIOGRAPHY N/A 12/12/2019   Procedure: LEFT HEART CATH AND CORONARY ANGIOGRAPHY with possible PCI;  Surgeon: Marcina Millard, MD;  Location: [facility] INVASIVE CV LAB;  Service: Cardiovascular;  Laterality: N/A;    Prior to Admission medications   Medication Sig Start Date End Date Taking? Authorizing Provider  acetaminophen (TYLENOL) 500 MG tablet Take 1,000 mg by mouth every 6 (six) hours as needed.   Yes [provider]  amLODipine (NORVASC) 10 MG tablet Take 1 tablet (10 mg total) by mouth daily. 12/21/20  Yes Lynn Ito, MD  aspirin EC 81 MG EC tablet Take 1 tablet (81 mg total) by mouth daily. Swallow whole. 12/21/20  Yes Lynn Ito, MD  carboxymethylcellulose (REFRESH PLUS) 0.5 % SOLN 1 drop daily as needed.   Yes [provider]  cetirizine (ZYRTEC) 5 MG tablet Take 5 mg by mouth daily.    Yes [provider]  clopidogrel (PLAVIX) 75 MG tablet Take 75 mg by mouth daily.   Yes [provider]  Cranberry 400 MG CAPS Take 1 capsule by mouth 2 (two) times daily.   Yes [provider]  diphenhydrAMINE (BENADRYL) 25 MG tablet  09/18/21  Yes [provider]  docusate sodium (COLACE) 100 MG capsule Take 2 capsules (200 mg total) by mouth 2 (two) times daily. 12/20/20  Yes Lynn Ito, MD  isosorbide mononitrate (IMDUR) 60 MG 24 hr tablet Take 1 tablet (60 mg total) by mouth daily. 12/21/20  Yes Lynn Ito, MD  magnesium oxide (MAG-OX) 400 MG tablet Take 400 mg by mouth daily.    Yes [provider]  Multiple Vitamins-Iron (MULTIVITAMIN/IRON PO) Take 1 tablet by mouth daily.   Yes [provider]  nitroGLYCERIN (NITROSTAT) 0.4 MG SL tablet Place 0.4 mg under the tongue every 5 (five) minutes as needed. For chest pain   Yes [provider]  pantoprazole (PROTONIX) 40 MG tablet Take 40 mg by mouth daily. 03/27/22 03/27/23 Yes [provider]  rivastigmine (EXELON) 3 MG capsule Take 3 mg by mouth 2 (two) times daily.   Yes [provider]  rosuvastatin (CRESTOR) 5 MG tablet Take 5 mg by mouth daily.   Yes [provider]  timolol (TIMOPTIC) 0.5 % ophthalmic solution Place 1 drop into both  eyes at bedtime. 06/26/19  Yes [provider]  torsemide (DEMADEX) 20 MG tablet Take 20 mg by mouth 2 (two) times daily as needed. 0.5 - 1.5 tabs as needed 03/10/21  Yes [provider]  vitamin B-12 (CYANOCOBALAMIN) 100 MCG tablet Take 5,000 mcg by mouth daily.   Yes [provider]  Amino Acids-Protein Hydrolys (PRO-STAT) LIQD Take 30 mLs by mouth 2 (two) times daily. Patient not taking: Reported on 10/15/2022    [provider]  donepezil (ARICEPT) 10 MG tablet Take 10 mg by mouth daily. Patient not taking: Reported on 10/15/2022 08/16/20   [provider]  fluticasone (FLONASE) 50  MCG/ACT nasal spray Place 1 spray into both nostrils daily. Patient not taking: Reported on 02/05/2022 12/21/20   Lynn Ito, MD  ipratropium-albuterol (DUONEB) 0.5-2.5 (3) MG/3ML SOLN Take 3 mLs by nebulization every 6 (six) hours as needed. Patient not taking: Reported on 10/15/2022 04/17/22   Kathlen Mody, MD  nitrofurantoin (MACRODANTIN) 50 MG capsule Take 50 mg by mouth 4 (four) times daily. Patient not taking: Reported on 10/15/2022    [provider]    Allergies as of 09/29/2022 - Review Complete 04/14/2022  Allergen Reaction Noted   Butenafine Swelling 12/08/2011   Levofloxacin Swelling and Other (See Comments) 05/07/2014   Limonene Other (See Comments) 05/07/2014   Penicillins  01/03/2020   Terbinafine and related Other (See Comments) 12/08/2011    Family History  Problem Relation Age of Onset   Cancer Mother    Heart disease Father    Breast cancer Maternal Aunt     Social History   Socioeconomic History   Marital status: Widowed    Spouse name: Not on file   Number of children: Not on file   Years of education: Not on file   Highest education level: Not on file  Occupational History   Not on file  Tobacco Use   Smoking status: Never   Smokeless tobacco: Never  Vaping Use   Vaping Use: Never used  Substance and Sexual Activity   Alcohol use: No   Drug use: No   Sexual activity: Not Currently    Birth control/protection: Post-menopausal  Other Topics Concern   Not on file  Social History Narrative   Not on file   Social Determinants of Health   Financial Resource Strain: Not on file  Food Insecurity: No Food Insecurity (04/16/2022)   Hunger Vital Sign    Worried About Running Out of Food in the Last Year: Never true    Ran Out of Food in the Last Year: Never true  Transportation Needs: No Transportation Needs (04/16/2022)   PRAPARE - Administrator, Civil Service (Medical): No    Lack of Transportation (Non-Medical): No   Physical Activity: Not on file  Stress: Not on file  Social Connections: Not on file  Intimate Partner Violence: Not At Risk (04/16/2022)   Humiliation, Afraid, Rape, and Kick questionnaire    Fear of Current or Ex-Partner: No    Emotionally Abused: No    Physically Abused: No    Sexually Abused: No    Review of Systems: See HPI, otherwise negative ROS  Physical Exam: BP (!) 179/52   Pulse 80   Temp 98 F (36.7 C) (Temporal)   Resp 17   Ht 5\' 1"  (1.549 m)   Wt 59.4 kg   SpO2 98%   BMI 24.75 kg/m  General:   Alert, cooperative in NAD Head:  Normocephalic  and atraumatic. Respiratory:  Normal work of breathing. Cardiovascular:  RRR  Impression/Plan: [name] is here for cataract surgery.  Risks, benefits, limitations, and alternatives regarding cataract surgery have been reviewed with the patient.  Questions have been answered.  All parties agreeable.   Willey Blade, MD  10/26/2022, 7:50 AM

## 2022-10-26 NOTE — Transfer of Care (Signed)
Immediate Anesthesia Transfer of Care Note  Patient: Emmalei Ertz Maulden  Procedure(s) Performed: CATARACT EXTRACTION PHACO AND INTRAOCULAR LENS PLACEMENT (IOC) LEFT DIABETIC  17.25  01:21.7 (Left: Eye)  Patient Location: PACU  Anesthesia Type:MAC  Level of Consciousness: awake and alert   Airway & Oxygen Therapy: Patient Spontanous Breathing  Post-op Assessment: Report given to RN and Post -op Vital signs reviewed and stable  Post vital signs: Reviewed and stable  Last Vitals:  Vitals Value Taken Time  BP 150/54 10/26/22 0834  Temp 36.3 C 10/26/22 0834  Pulse 66 10/26/22 0836  Resp 10 10/26/22 0836  SpO2 100 % 10/26/22 0836  Vitals shown include unvalidated device data.  Last Pain:  Vitals:   10/26/22 0834  TempSrc:   PainSc: 0-No pain         Complications: No notable events documented.

## 2022-10-26 NOTE — Anesthesia Postprocedure Evaluation (Signed)
Anesthesia Post Note  Patient: Juliette Mezera Caltagirone  Procedure(s) Performed: CATARACT EXTRACTION PHACO AND INTRAOCULAR LENS PLACEMENT (IOC) LEFT DIABETIC  17.25  01:21.7 (Left: Eye)  Patient location during evaluation: PACU Anesthesia Type: MAC Level of consciousness: awake and alert Pain management: pain level controlled Vital Signs Assessment: post-procedure vital signs reviewed and stable Respiratory status: spontaneous breathing, nonlabored ventilation, respiratory function stable and patient connected to nasal cannula oxygen Cardiovascular status: stable and blood pressure returned to baseline Postop Assessment: no apparent nausea or vomiting Anesthetic complications: no   No notable events documented.   Last Vitals:  Vitals:   10/26/22 0834 10/26/22 0840  BP: (!) 150/54 (!) 148/50  Pulse: 65 64  Resp: 15 17  Temp: (!) 36.3 C (!) 36.3 C  SpO2: 100% 99%    Last Pain:  Vitals:   10/26/22 0840  TempSrc:   PainSc: 0-No pain                 Haadiya Frogge C Ebert Forrester

## 2022-10-27 ENCOUNTER — Encounter: Payer: Self-pay | Admitting: Ophthalmology

## 2022-10-29 ENCOUNTER — Encounter: Payer: Self-pay | Admitting: Ophthalmology

## 2022-10-30 NOTE — Anesthesia Preprocedure Evaluation (Addendum)
Anesthesia Evaluation  Patient identified by MRN, date of birth, ID band Patient awake    Reviewed: Allergy & Precautions, NPO status , Patient's Chart, lab work & pertinent test results  History of Anesthesia Complications Negative for: history of anesthetic complications  Airway Mallampati: IV   Neck ROM: Full    Dental  (+) Missing   Pulmonary neg pulmonary ROS   Pulmonary exam normal breath sounds clear to auscultation       Cardiovascular hypertension, + CAD (s/p MI and stents on Plavix) and + Peripheral Vascular Disease (carotid stenosis)  Normal cardiovascular exam Rhythm:Regular Rate:Normal     Neuro/Psych  PSYCHIATRIC DISORDERS     Dementia HOH  Neuromuscular disease (neuropathy)    GI/Hepatic ,GERD  ,,  Endo/Other  diabetes, Type 2    Renal/GU Renal disease (stage III CKD)     Musculoskeletal   Abdominal   Peds  Hematology  (+) Blood dyscrasia, anemia   Anesthesia Other Findings   Reproductive/Obstetrics                             Anesthesia Physical Anesthesia Plan  ASA: 3  Anesthesia Plan: MAC   Post-op Pain Management:    Induction: Intravenous  PONV Risk Score and Plan: 2 and Treatment may vary due to age or medical condition, Midazolam and TIVA  Airway Management Planned: Natural Airway and Nasal Cannula  Additional Equipment:   Intra-op Plan:   Post-operative Plan:   Informed Consent: I have reviewed the patients History and Physical, chart, labs and discussed the procedure including the risks, benefits and alternatives for the proposed anesthesia with the patient or authorized representative who has indicated his/her understanding and acceptance.     Dental advisory given  Plan Discussed with: CRNA  Anesthesia Plan Comments: (Patient complaining of substernal burning in waiting room.  Discussed and she states this feeling occurs often, and could be  reflux.  Brought patient back to preop, vitals stable, 12-lead ECG unchanged from prior.  Patient states that the burning has eased.  Will proceed with case.  LMA/GETA backup discussed.  Patient consented for risks of anesthesia including but not limited to:  - adverse reactions to medications - damage to eyes, teeth, lips or other oral mucosa - nerve damage due to positioning  - sore throat or hoarseness - damage to heart, brain, nerves, lungs, other parts of body or loss of life  Informed patient about role of CRNA in peri- and intra-operative care.  Patient voiced understanding.)        Anesthesia Quick Evaluation

## 2022-11-06 NOTE — Discharge Instructions (Signed)

## 2022-11-09 ENCOUNTER — Ambulatory Visit: Payer: Medicare Other | Admitting: Anesthesiology

## 2022-11-09 ENCOUNTER — Other Ambulatory Visit: Payer: Self-pay

## 2022-11-09 ENCOUNTER — Ambulatory Visit
Admission: RE | Admit: 2022-11-09 | Discharge: 2022-11-09 | Disposition: A | Discharge status: Home / Self Care | Payer: Medicare Other | Attending: Ophthalmology | Admitting: Ophthalmology

## 2022-11-09 ENCOUNTER — Encounter
Admission: RE | Disposition: A | Discharge status: Home / Self Care | Payer: Self-pay | Source: Home / Self Care | Attending: Ophthalmology

## 2022-11-09 ENCOUNTER — Encounter: Payer: Self-pay | Admitting: Ophthalmology

## 2022-11-09 DIAGNOSIS — E1140 Type 2 diabetes mellitus with diabetic neuropathy, unspecified: Secondary | ICD-10-CM | POA: Insufficient documentation

## 2022-11-09 DIAGNOSIS — N183 Chronic kidney disease, stage 3 unspecified: Secondary | ICD-10-CM | POA: Diagnosis not present

## 2022-11-09 DIAGNOSIS — I129 Hypertensive chronic kidney disease with stage 1 through stage 4 chronic kidney disease, or unspecified chronic kidney disease: Secondary | ICD-10-CM | POA: Insufficient documentation

## 2022-11-09 DIAGNOSIS — E1122 Type 2 diabetes mellitus with diabetic chronic kidney disease: Secondary | ICD-10-CM | POA: Insufficient documentation

## 2022-11-09 DIAGNOSIS — Z7902 Long term (current) use of antithrombotics/antiplatelets: Secondary | ICD-10-CM | POA: Insufficient documentation

## 2022-11-09 DIAGNOSIS — H2181 Floppy iris syndrome: Secondary | ICD-10-CM | POA: Insufficient documentation

## 2022-11-09 DIAGNOSIS — I252 Old myocardial infarction: Secondary | ICD-10-CM | POA: Insufficient documentation

## 2022-11-09 DIAGNOSIS — E1136 Type 2 diabetes mellitus with diabetic cataract: Secondary | ICD-10-CM | POA: Insufficient documentation

## 2022-11-09 DIAGNOSIS — R079 Chest pain, unspecified: Secondary | ICD-10-CM

## 2022-11-09 DIAGNOSIS — Z01810 Encounter for preprocedural cardiovascular examination: Secondary | ICD-10-CM | POA: Diagnosis not present

## 2022-11-09 HISTORY — PX: CATARACT EXTRACTION W/PHACO: SHX586

## 2022-11-09 SURGERY — PHACOEMULSIFICATION, CATARACT, WITH IOL INSERTION
Anesthesia: Monitor Anesthesia Care | Laterality: Right

## 2022-11-09 MED ORDER — POLYMYXIN B-TRIMETHOPRIM 10000-0.1 UNIT/ML-% OP SOLN
OPHTHALMIC | Status: DC | PRN
  Administered 2022-11-09: 1 [drp] via OPHTHALMIC

## 2022-11-09 MED ORDER — LIDOCAINE HCL (PF) 2 % IJ SOLN
INTRAOCULAR | Status: DC | PRN
  Administered 2022-11-09: 1 mL via INTRAOCULAR

## 2022-11-09 MED ORDER — TETRACAINE HCL 0.5 % OP SOLN
1.0000 [drp] | OPHTHALMIC | Status: DC | PRN
  Administered 2022-11-09 (×3): 1 [drp] via OPHTHALMIC

## 2022-11-09 MED ORDER — SIGHTPATH DOSE#1 BSS IO SOLN
INTRAOCULAR | Status: DC | PRN
  Administered 2022-11-09: 96 mL via OPHTHALMIC

## 2022-11-09 MED ORDER — LACTATED RINGERS IV SOLN: INTRAVENOUS | Status: DC

## 2022-11-09 MED ORDER — ARMC OPHTHALMIC DILATING DROPS
1.0000 | OPHTHALMIC | Status: DC | PRN
  Administered 2022-11-09 (×3): 1 via OPHTHALMIC

## 2022-11-09 MED ORDER — SIGHTPATH DOSE#1 BSS IO SOLN
INTRAOCULAR | Status: DC | PRN
  Administered 2022-11-09: 15 mL

## 2022-11-09 MED ORDER — FENTANYL CITRATE (PF) 100 MCG/2ML IJ SOLN
INTRAMUSCULAR | Status: DC | PRN
  Administered 2022-11-09: 12.5 ug via INTRAVENOUS

## 2022-11-09 MED ORDER — MIDAZOLAM HCL 2 MG/2ML IJ SOLN
INTRAMUSCULAR | Status: DC | PRN
  Administered 2022-11-09: .75 mg via INTRAVENOUS

## 2022-11-09 MED ORDER — SIGHTPATH DOSE#1 NA HYALUR & NA CHOND-NA HYALUR IO KIT
PACK | INTRAOCULAR | Status: DC | PRN
  Administered 2022-11-09: 1 via OPHTHALMIC

## 2022-11-09 SURGICAL SUPPLY — 12 items
CATARACT SUITE SIGHTPATH (MISCELLANEOUS) ×1 IMPLANT
DISSECTOR HYDRO NUCLEUS 50X22 (MISCELLANEOUS) ×1 IMPLANT
FEE CATARACT SUITE SIGHTPATH (MISCELLANEOUS) ×1 IMPLANT
GLOVE SURG GAMMEX PI TX LF 7.5 (GLOVE) ×1 IMPLANT
GLOVE SURG SYN 8.5 E (GLOVE) ×1 IMPLANT
GLOVE SURG SYN 8.5 PF PI (GLOVE) ×1 IMPLANT
LENS IOL TECNIS EYHANCE 20.5 (Intraocular Lens) IMPLANT
NDL FILTER BLUNT 18X1 1/2 (NEEDLE) ×1 IMPLANT
NEEDLE FILTER BLUNT 18X1 1/2 (NEEDLE) ×1 IMPLANT
RING MALYGIN (MISCELLANEOUS) IMPLANT
SYR 3ML LL SCALE MARK (SYRINGE) ×1 IMPLANT
SYR 5ML LL (SYRINGE) ×1 IMPLANT

## 2022-11-09 NOTE — H&P (Signed)
[facility]   Primary Care Physician:  Danella Penton, MD Ophthalmologist: Dr. Willey Blade  Pre-Procedure History & Physical: HPI:  [name] is a 89 y.o. female here for cataract surgery.   Past Medical History:  Diagnosis Date   Bilateral carotid artery stenosis    Gastritis and duodenitis    Grade I diastolic dysfunction    History of syncope    Hypercholesteremia    Hypertension    Left bundle branch block (LBBB)    Memory changes    Mild aortic regurgitation    Myocardial infarction (HCC) 04/20/2006   S/P angioplasty with stent    Symptomatic sinus bradycardia    Type 2 diabetes mellitus with diabetic neuropathy (HCC)    Wears hearing aid in both ears    Has.  does not usually wear    Past Surgical History:  Procedure Laterality Date   ABDOMINAL HYSTERECTOMY     APPENDECTOMY     CATARACT EXTRACTION W/PHACO Left 10/26/2022   Procedure: CATARACT EXTRACTION PHACO AND INTRAOCULAR LENS PLACEMENT (IOC) LEFT DIABETIC  17.25  01:21.7;  Surgeon: Nevada Crane, MD;  Location: [facility];  Service: Ophthalmology;  Laterality: Left;   CORONARY ANGIOPLASTY WITH STENT PLACEMENT     CORONARY STENT INTERVENTION N/A 12/12/2019   Procedure: CORONARY STENT INTERVENTION;  Surgeon: Marcina Millard, MD;  Location: [facility] INVASIVE CV LAB;  Service: Cardiovascular;  Laterality: N/A;   LEFT HEART CATH AND CORONARY ANGIOGRAPHY N/A 12/12/2019   Procedure: LEFT HEART CATH AND CORONARY ANGIOGRAPHY with possible PCI;  Surgeon: Marcina Millard, MD;  Location: [facility] INVASIVE CV LAB;  Service: Cardiovascular;  Laterality: N/A;    Prior to Admission medications   Medication Sig Start Date End Date Taking? Authorizing Provider  acetaminophen (TYLENOL) 500 MG tablet Take 1,000 mg by mouth every 6 (six) hours as needed.   Yes [provider]  amLODipine (NORVASC) 10 MG tablet Take 1 tablet (10 mg total) by mouth daily. 12/21/20  Yes Lynn Ito, MD  aspirin EC 81 MG  EC tablet Take 1 tablet (81 mg total) by mouth daily. Swallow whole. 12/21/20  Yes Lynn Ito, MD  carboxymethylcellulose (REFRESH PLUS) 0.5 % SOLN 1 drop daily as needed.   Yes [provider]  cetirizine (ZYRTEC) 5 MG tablet Take 5 mg by mouth daily.   Yes [provider]  clopidogrel (PLAVIX) 75 MG tablet Take 75 mg by mouth daily.   Yes [provider]  Cranberry 400 MG CAPS Take 1 capsule by mouth 2 (two) times daily.   Yes [provider]  diphenhydrAMINE (BENADRYL) 25 MG tablet  09/18/21  Yes [provider]  docusate sodium (COLACE) 100 MG capsule Take 2 capsules (200 mg total) by mouth 2 (two) times daily. 12/20/20  Yes Lynn Ito, MD  isosorbide mononitrate (IMDUR) 60 MG 24 hr tablet Take 1 tablet (60 mg total) by mouth daily. 12/21/20  Yes Lynn Ito, MD  magnesium oxide (MAG-OX) 400 MG tablet Take 400 mg by mouth daily.    Yes [provider]  Multiple Vitamins-Iron (MULTIVITAMIN/IRON PO) Take 1 tablet by mouth daily.   Yes [provider]  pantoprazole (PROTONIX) 40 MG tablet Take 40 mg by mouth daily. 03/27/22 03/27/23 Yes [provider]  rivastigmine (EXELON) 3 MG capsule Take 3 mg by mouth 2 (two) times daily.   Yes [provider]  rosuvastatin (CRESTOR) 5 MG tablet Take 5 mg by mouth daily.   Yes [provider]  timolol (TIMOPTIC) 0.5 % ophthalmic solution Place 1 drop into both eyes at bedtime. 06/26/19  Yes [provider]  torsemide (DEMADEX) 20 MG tablet Take 20 mg by mouth 2 (two) times daily as needed. 0.5 - 1.5 tabs as needed 03/10/21  Yes [provider]  vitamin B-12 (CYANOCOBALAMIN) 100 MCG tablet Take 5,000 mcg by mouth daily.   Yes [provider]  Amino Acids-Protein Hydrolys (PRO-STAT) LIQD Take 30 mLs by mouth 2 (two) times daily. Patient not taking: Reported on 10/15/2022    [provider]  donepezil (ARICEPT) 10 MG tablet Take 10 mg by mouth  daily. Patient not taking: Reported on 10/15/2022 08/16/20   [provider]  fluticasone (FLONASE) 50 MCG/ACT nasal spray Place 1 spray into both nostrils daily. Patient not taking: Reported on 02/05/2022 12/21/20   Lynn Ito, MD  ipratropium-albuterol (DUONEB) 0.5-2.5 (3) MG/3ML SOLN Take 3 mLs by nebulization every 6 (six) hours as needed. Patient not taking: Reported on 10/15/2022 04/17/22   Kathlen Mody, MD  nitrofurantoin (MACRODANTIN) 50 MG capsule Take 50 mg by mouth 4 (four) times daily. Patient not taking: Reported on 10/15/2022    [provider]  nitroGLYCERIN (NITROSTAT) 0.4 MG SL tablet Place 0.4 mg under the tongue every 5 (five) minutes as needed. For chest pain    [provider]    Allergies as of 09/29/2022 - Review Complete 04/14/2022  Allergen Reaction Noted   Butenafine Swelling 12/08/2011   Levofloxacin Swelling and Other (See Comments) 05/07/2014   Limonene Other (See Comments) 05/07/2014   Penicillins  01/03/2020   Terbinafine and related Other (See Comments) 12/08/2011    Family History  Problem Relation Age of Onset   Cancer Mother    Heart disease Father    Breast cancer Maternal Aunt     Social History   Socioeconomic History   Marital status: Widowed    Spouse name: Not on file   Number of children: Not on file   Years of education: Not on file   Highest education level: Not on file  Occupational History   Not on file  Tobacco Use   Smoking status: Never   Smokeless tobacco: Never  Vaping Use   Vaping status: Never Used  Substance and Sexual Activity   Alcohol use: No   Drug use: No   Sexual activity: Not Currently    Birth control/protection: Post-menopausal  Other Topics Concern   Not on file  Social History Narrative   Not on file   Social Determinants of Health   Financial Resource Strain: Low Risk  (01/12/2020)   Received from [facility], [facility]   Overall  Financial Resource Strain (CARDIA)    Difficulty of Paying Living Expenses: Not hard at all  Food Insecurity: No Food Insecurity (04/16/2022)   Hunger Vital Sign    Worried About Running Out of Food in the Last Year: Never true    Ran Out of Food in the Last Year: Never true  Transportation Needs: No Transportation Needs (04/16/2022)   PRAPARE - Administrator, Civil Service (Medical): No    Lack of Transportation (Non-Medical): No  Physical Activity: Inactive (01/15/2020)   Received from [facility], [facility]   Exercise Vital Sign    Days of Exercise per Week: 0 days    Minutes of Exercise per Session: 0 min  Stress: No Stress Concern Present (01/12/2020)   Received  from [facility], [facility]   [employer] of Occupational Health - Occupational Stress Questionnaire    Feeling of Stress : Not at all  Social Connections: Moderately Isolated (01/12/2020)   Received from [facility], [facility]   Social Connection and Isolation Panel [NHANES]    Frequency of Communication with Friends and Family: More than three times a week    Frequency of Social Gatherings with Friends and Family: More than three times a week    Attends Religious Services: More than 4 times per year    Active Member of [employer] or Organizations: No    Attends Banker Meetings: Never    Marital Status: Widowed  Intimate Partner Violence: Not At Risk (04/16/2022)   Humiliation, Afraid, Rape, and Kick questionnaire    Fear of Current or Ex-Partner: No    Emotionally Abused: No    Physically Abused: No    Sexually Abused: No    Review of Systems: See HPI, otherwise negative ROS  Physical Exam: BP (!) 185/65   Pulse 68   Temp 98.4 F (36.9 C) (Temporal)   Ht 5' 0.98" (1.549 m)   Wt 59.1 kg   SpO2 100%   BMI 24.61 kg/m  General:   Alert, cooperative in NAD Head:   Normocephalic and atraumatic. Respiratory:  Normal work of breathing. Cardiovascular:  RRR  Impression/Plan: [name] is here for cataract surgery.  Risks, benefits, limitations, and alternatives regarding cataract surgery have been reviewed with the patient.  Questions have been answered.  All parties agreeable.   Willey Blade, MD  11/09/2022, 10:50 AM

## 2022-11-09 NOTE — Anesthesia Postprocedure Evaluation (Signed)
Anesthesia Post Note  Patient: [name]  Procedure(s) Performed: CATARACT EXTRACTION PHACO AND INTRAOCULAR LENS PLACEMENT (IOC) RIGHT DIABETIC 10.30 00:56.3 (Right)  Patient location during evaluation: PACU Anesthesia Type: MAC Level of consciousness: awake and alert, oriented and patient cooperative Pain management: pain level controlled Vital Signs Assessment: post-procedure vital signs reviewed and stable Respiratory status: spontaneous breathing, nonlabored ventilation and respiratory function stable Cardiovascular status: blood pressure returned to baseline and stable Postop Assessment: adequate PO intake Anesthetic complications: no   No notable events documented.   Last Vitals:  Vitals:   11/09/22 1120 11/09/22 1127  BP: (!) 144/51 (!) 162/57  Pulse: 65 65  Resp: 13 13  Temp: (!) 36.3 C (!) 36.3 C  SpO2: 100% 100%    Last Pain:  Vitals:   11/09/22 1127  TempSrc:   PainSc: 0-No pain                 Reed Breech

## 2022-11-09 NOTE — Transfer of Care (Signed)
Immediate Anesthesia Transfer of Care Note  Patient: [name]  Procedure(s) Performed: CATARACT EXTRACTION PHACO AND INTRAOCULAR LENS PLACEMENT (IOC) RIGHT DIABETIC 10.30 00:56.3 (Right)  Patient Location: PACU  Anesthesia Type:MAC  Level of Consciousness: awake, alert , and oriented  Airway & Oxygen Therapy: Patient Spontanous Breathing  Post-op Assessment: Report given to RN and Post -op Vital signs reviewed and stable  Post vital signs: Reviewed and stable  Last Vitals: See PACU flow sheet  Vitals Value Taken Time  BP 144/51 11/09/22 1120  Temp    Pulse 64 11/09/22 1121  Resp 14 11/09/22 1121  SpO2 100 % 11/09/22 1121  Vitals shown include unfiled device data.  Last Pain:  Vitals:   11/09/22 0958  TempSrc: Temporal  PainSc: 0-No pain         Complications: No notable events documented.

## 2022-11-09 NOTE — Op Note (Signed)
OPERATIVE NOTE  [name] [id number] 11/09/2022   PREOPERATIVE DIAGNOSIS:  Nuclear sclerotic cataract right eye.  H25.11   POSTOPERATIVE DIAGNOSIS:     Nuclear sclerotic cataract right eye.   Intraoperative floppy iris syndrome   PROCEDURE:  CPT [phone] Complex Phacoemusification with posterior chamber intraocular lens placement of the right eye, requiring malygin ring for dilation   LENS:   Implant Name Type Inv. Item Serial No. Manufacturer Lot No. LRB No. Used Action  LENS IOL TECNIS EYHANCE 20.5 - [id number] Intraocular Lens LENS IOL TECNIS EYHANCE 20.5 [id number] [employer]  Right 1 Implanted       Procedure(s): CATARACT EXTRACTION PHACO AND INTRAOCULAR LENS PLACEMENT (IOC) RIGHT DIABETIC 10.30 00:56.3 (Right)  DIB00 +20.5   ULTRASOUND TIME: 0 minutes 56 seconds.  CDE 10.3   SURGEON:  Willey Blade, MD, MPH  ANESTHESIOLOGIST: Anesthesiologist: Reed Breech, MD CRNA: Genia Del, CRNA   ANESTHESIA:  Topical with tetracaine drops augmented with 1% preservative-free intracameral lidocaine.  ESTIMATED BLOOD LOSS: less than 1 mL.   COMPLICATIONS:  None.   DESCRIPTION OF PROCEDURE:  The patient was identified in the holding room and transported to the operating room and placed in the supine position under the operating microscope.  The right eye was identified as the operative eye and it was prepped and draped in the usual sterile ophthalmic fashion.   A 1.0 millimeter clear-corneal paracentesis was made at the 10:30 position. 0.5 ml of preservative-free 1% lidocaine with epinephrine was injected into the anterior chamber.  The anterior chamber was filled with viscoelastic.  A 2.4 millimeter keratome was used to make a near-clear corneal incision at the 8:00 position.    The pupil was small so a malyugin ring was placed.  A curvilinear capsulorrhexis was made with a cystotome and capsulorrhexis forceps.  Balanced salt solution was used to hydrodissect and  hydrodelineate the nucleus.   Phacoemulsification was then used in stop and chop fashion to remove the lens nucleus and epinucleus.  The remaining cortex was then removed using the irrigation and aspiration handpiece. Viscoelastic was then placed into the capsular bag to distend it for lens placement.  A lens was then injected into the capsular bag.   The malyugin ring was removed. The remaining viscoelastic was aspirated.   Wounds were hydrated with balanced salt solution.  The anterior chamber was inflated to a physiologic pressure with balanced salt solution.   No intracameral antibiotics were used due to patient allergies.  Polytrim drops were placed on the surface.   No wound leaks were noted.  The patient was taken to the recovery room in stable condition without complications of anesthesia or surgery  Willey Blade 11/09/2022, 11:19 AM

## 2022-11-10 ENCOUNTER — Encounter: Payer: Self-pay | Admitting: Ophthalmology

## 2023-03-08 ENCOUNTER — Ambulatory Visit (INDEPENDENT_AMBULATORY_CARE_PROVIDER_SITE_OTHER): Payer: Medicare Other

## 2023-03-08 ENCOUNTER — Ambulatory Visit (INDEPENDENT_AMBULATORY_CARE_PROVIDER_SITE_OTHER): Payer: Medicare Other | Admitting: Vascular Surgery

## 2023-03-08 ENCOUNTER — Encounter (INDEPENDENT_AMBULATORY_CARE_PROVIDER_SITE_OTHER): Payer: Self-pay | Admitting: Vascular Surgery

## 2023-03-08 VITALS — BP 177/69 | HR 69 | Resp 18 | Ht 61.0 in | Wt 140.0 lb

## 2023-03-08 DIAGNOSIS — I6523 Occlusion and stenosis of bilateral carotid arteries: Secondary | ICD-10-CM

## 2023-03-08 DIAGNOSIS — I10 Essential (primary) hypertension: Secondary | ICD-10-CM | POA: Diagnosis not present

## 2023-03-08 DIAGNOSIS — I25118 Atherosclerotic heart disease of native coronary artery with other forms of angina pectoris: Secondary | ICD-10-CM | POA: Diagnosis not present

## 2023-03-08 DIAGNOSIS — E1159 Type 2 diabetes mellitus with other circulatory complications: Secondary | ICD-10-CM | POA: Diagnosis not present

## 2023-03-08 DIAGNOSIS — E782 Mixed hyperlipidemia: Secondary | ICD-10-CM

## 2023-03-08 NOTE — Progress Notes (Signed)
MRN : [id number]  [name] is a 89 y.o. (Aug 12, 1930) female who presents with chief complaint of check carotid arteries.  History of Present Illness:   The patient is seen for follow up evaluation of carotid stenosis. The carotid stenosis followed by ultrasound.  She was hospitalized in mid October 2023 with syncope, no focal symptoms, carotid ultrasound was unchanged compared to previous study.   The patient denies amaurosis fugax. There is no recent history of TIA symptoms or focal motor deficits. There is no prior documented CVA.   The patient is taking enteric-coated aspirin 81 mg daily.   There is no history of migraine headaches. There is no history of seizures.   The patient has a history of coronary artery disease, no recent episodes of angina or shortness of breath. The patient denies PAD or claudication symptoms. There is a history of hyperlipidemia which is being treated with a statin.     Carotid Duplex done today shows 40-59% RICA and 40-59% LICA.  No change compared to last study.   No outpatient medications have been marked as taking for the 03/08/23 encounter (Appointment) with Gilda Crease, Latina Craver, MD.    Past Medical History:  Diagnosis Date   Bilateral carotid artery stenosis    Gastritis and duodenitis    Grade I diastolic dysfunction    History of syncope    Hypercholesteremia    Hypertension    Left bundle branch block (LBBB)    Memory changes    Mild aortic regurgitation    Myocardial infarction (HCC) 04/20/2006   S/P angioplasty with stent    Symptomatic sinus bradycardia    Type 2 diabetes mellitus with diabetic neuropathy (HCC)    Wears hearing aid in both ears    Has.  does not usually wear    Past Surgical History:  Procedure Laterality Date   ABDOMINAL HYSTERECTOMY     APPENDECTOMY     CATARACT EXTRACTION W/PHACO Left 10/26/2022   Procedure: CATARACT EXTRACTION PHACO AND INTRAOCULAR LENS PLACEMENT (IOC) LEFT  DIABETIC  17.25  01:21.7;  Surgeon: Nevada Crane, MD;  Location: [facility];  Service: Ophthalmology;  Laterality: Left;   CATARACT EXTRACTION W/PHACO Right 11/09/2022   Procedure: CATARACT EXTRACTION PHACO AND INTRAOCULAR LENS PLACEMENT (IOC) RIGHT DIABETIC 10.30 00:56.3;  Surgeon: Nevada Crane, MD;  Location: [facility];  Service: Ophthalmology;  Laterality: Right;   CORONARY ANGIOPLASTY WITH STENT PLACEMENT     CORONARY STENT INTERVENTION N/A 12/12/2019   Procedure: CORONARY STENT INTERVENTION;  Surgeon: Marcina Millard, MD;  Location: [facility];  Service: Cardiovascular;  Laterality: N/A;   LEFT HEART CATH AND CORONARY ANGIOGRAPHY N/A 12/12/2019   Procedure: LEFT HEART CATH AND CORONARY ANGIOGRAPHY with possible PCI;  Surgeon: Marcina Millard, MD;  Location: [facility];  Service: Cardiovascular;  Laterality: N/A;    Social History Social History   Tobacco Use   Smoking status: Never   Smokeless tobacco: Never  Vaping Use   Vaping status: Never Used  Substance Use Topics   Alcohol use: No   Drug use: No    Family History Family History  Problem Relation Age of Onset   Cancer Mother    Heart disease Father    Breast cancer Maternal Aunt     Allergies  Allergen Reactions   Butenafine Swelling  Tongue swelled and turned red   Levofloxacin Swelling and Other (See Comments)    Other reaction(s): Other (See Comments) Tongue swelled and turned red   Limonene Other (See Comments)    Other reaction(s): Other (See Comments) Tongue swelled and turned red Tongue swelled and turned red    Penicillins     Other reaction(s): Unknown   Terbinafine And Related Other (See Comments)    Tongue swelled and turned red     REVIEW OF SYSTEMS (Negative unless checked)  Constitutional: [] Weight loss  [] Fever  [] Chills Cardiac: [] Chest pain   [] Chest pressure   [] Palpitations   [] Shortness of breath when laying flat    [] Shortness of breath with exertion. Vascular:  [x] Pain in legs with walking   [] Pain in legs at rest  [] History of DVT   [] Phlebitis   [] Swelling in legs   [] Varicose veins   [] Non-healing ulcers Pulmonary:   [] Uses home oxygen   [] Productive cough   [] Hemoptysis   [] Wheeze  [] COPD   [] Asthma Neurologic:  [] Dizziness   [] Seizures   [] History of stroke   [] History of TIA  [] Aphasia   [] Vissual changes   [] Weakness or numbness in arm   [] Weakness or numbness in leg Musculoskeletal:   [] Joint swelling   [] Joint pain   [] Low back pain Hematologic:  [] Easy bruising  [] Easy bleeding   [] Hypercoagulable state   [] Anemic Gastrointestinal:  [] Diarrhea   [] Vomiting  [] Gastroesophageal reflux/heartburn   [] Difficulty swallowing. Genitourinary:  [] Chronic kidney disease   [] Difficult urination  [] Frequent urination   [] Blood in urine Skin:  [] Rashes   [] Ulcers  Psychological:  [] History of anxiety   []  History of major depression.  Physical Examination  There were no vitals filed for this visit. There is no height or weight on file to calculate BMI. Gen: WD/WN, NAD Head: [place]/AT, No temporalis wasting.  Ear/Nose/Throat: Hearing grossly intact, nares w/o erythema or drainage Eyes: PER, EOMI, sclera nonicteric.  Neck: Supple, no masses.  No bruit or JVD.  Pulmonary:  Good air movement, no audible wheezing, no use of accessory muscles.  Cardiac: RRR, normal S1, S2, no Murmurs. Vascular:  carotid bruit noted Vessel Right Left  Radial Palpable Palpable  Carotid  Palpable  Palpable  Gastrointestinal: soft, non-distended. No guarding/no peritoneal signs.  Musculoskeletal: M/S 5/5 throughout.  No visible deformity.  Neurologic: CN 2-12 intact. Pain and light touch intact in extremities.  Symmetrical.  Speech is fluent. Motor exam as listed above. Psychiatric: Judgment intact, Mood & affect appropriate for pt's clinical situation. Dermatologic: No rashes or ulcers noted.  No changes consistent with  cellulitis.   CBC Lab Results  Component Value Date   WBC 9.4 10/24/2022   HGB 12.3 10/24/2022   HCT 37.0 10/24/2022   MCV 98.7 10/24/2022   PLT 305 10/24/2022    BMET    Component Value Date/Time   NA 138 10/24/2022 1338   NA 139 08/16/2012 0255   K 3.5 10/24/2022 1338   K 3.9 08/16/2012 0255   CL 101 10/24/2022 1338   CL 108 (H) 08/16/2012 0255   CO2 24 10/24/2022 1338   CO2 25 08/16/2012 0255   GLUCOSE 126 (H) 10/24/2022 1338   GLUCOSE 129 (H) 08/16/2012 0255   BUN 27 (H) 10/24/2022 1338   BUN 21 (H) 08/16/2012 0255   CREATININE 1.29 (H) 10/24/2022 1338   CREATININE 1.43 (H) 08/16/2012 0255   CALCIUM 9.4 10/24/2022 1338   CALCIUM 8.5 08/16/2012 0255   GFRNONAA 40 (  L) 10/24/2022 1338   GFRNONAA 35 (L) 08/16/2012 0255   GFRAA 46 (L) 12/13/2019 0519   GFRAA 40 (L) 08/16/2012 0255   CrCl cannot be calculated (Patient's most recent lab result is older than the maximum 21 days allowed.).  COAG Lab Results  Component Value Date   INR 0.9 08/26/2020   INR 0.8 12/11/2019   INR 1.0 11/17/2019    Radiology No results found.   Assessment/Plan 1. Bilateral carotid artery stenosis Recommend:  Given the patient's asymptomatic subcritical stenosis no further invasive testing or surgery at this time.  Duplex ultrasound shows <50% stenosis bilaterally which has been unchanged when compared to the previous studies.  Continue antiplatelet therapy as prescribed Continue management of CAD, HTN and Hyperlipidemia Healthy heart diet,  encouraged exercise at least 4 times per week  Given the stable <50% bilateral carotid stenosis in association with the patient's comorbidities and age the patient will follow up PRN.  The patient is told that if symptoms of a TIA should occur then he should go to the ER and I should be notified, as this would change the management course.  The patient/family voices understanding.  2. Essential hypertension Continue antihypertensive  medications as already ordered, these medications have been reviewed and there are no changes at this time.  3. Coronary artery disease of native artery of native heart with stable angina pectoris (HCC) Continue cardiac and antihypertensive medications as already ordered and reviewed, no changes at this time.  Continue statin as ordered and reviewed, no changes at this time  Nitrates PRN for chest pain  4. Type 2 diabetes mellitus with other circulatory complication, unspecified whether long term insulin use (HCC) Continue hypoglycemic medications as already ordered, these medications have been reviewed and there are no changes at this time.  Hgb A1C to be monitored as already arranged by primary service  5. Mixed hyperlipidemia Continue statin as ordered and reviewed, no changes at this time    Levora Dredge, MD  03/08/2023 1:16 PM

## 2023-06-28 IMAGING — MR MR MRA HEAD W/O CM
1 series · 20 of 48 positions shown · non-contrast
Comparison: No pertinent prior exam.

CLINICAL DATA: Carotid artery stenosis

EXAM:
MRA HEAD WITHOUT CONTRAST
TECHNIQUE: Angiographic images of the Circle of Willis were acquired using MRA
technique without intravenous contrast.

[Series 5: TOF · axial · 0.5mm · 0.41mm/px · z∈[-104,-7]mm · 20 of 205 slices shown]
[im 1/205]
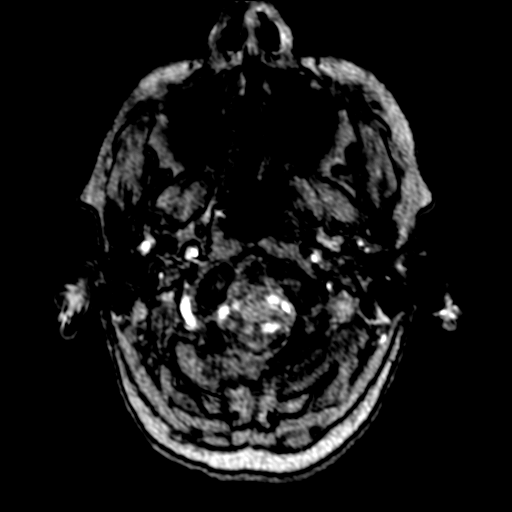
[im 5/205]
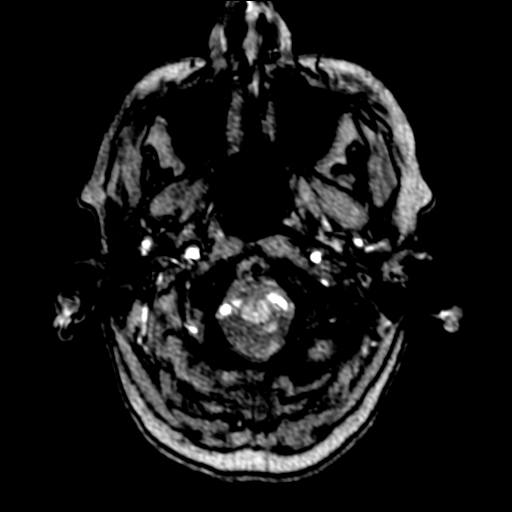
[im 9/205]
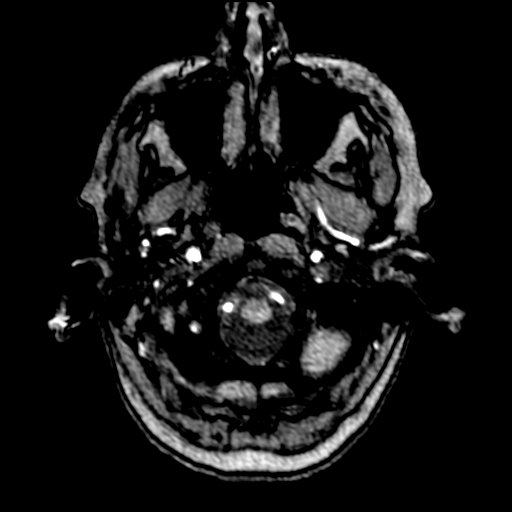
[im 14/205]
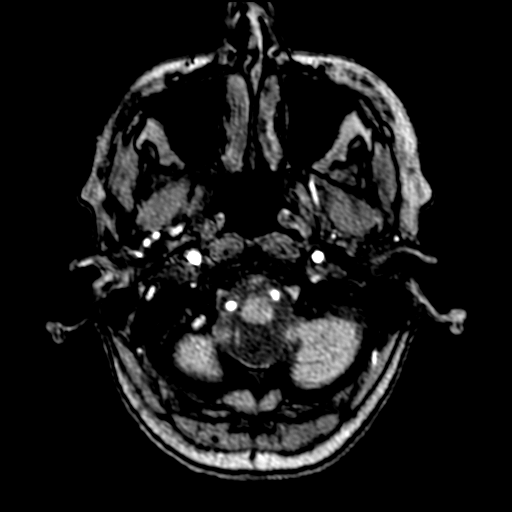
[im 18/205]
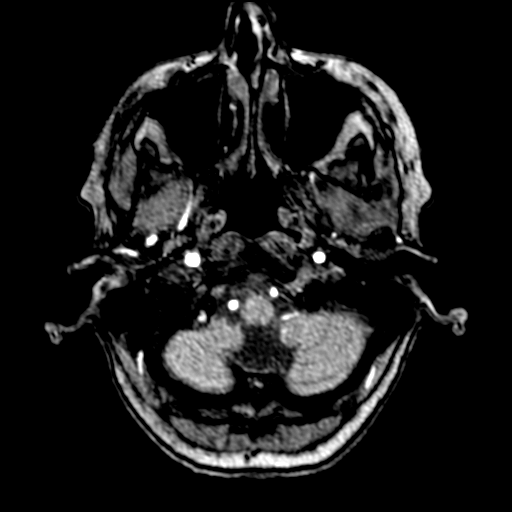
[im 22/205]
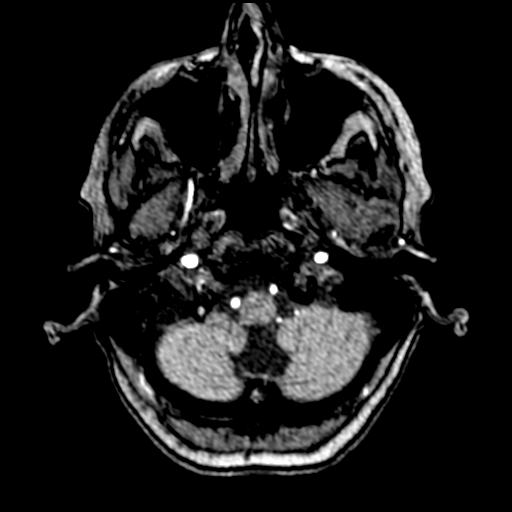
[im 27/205]
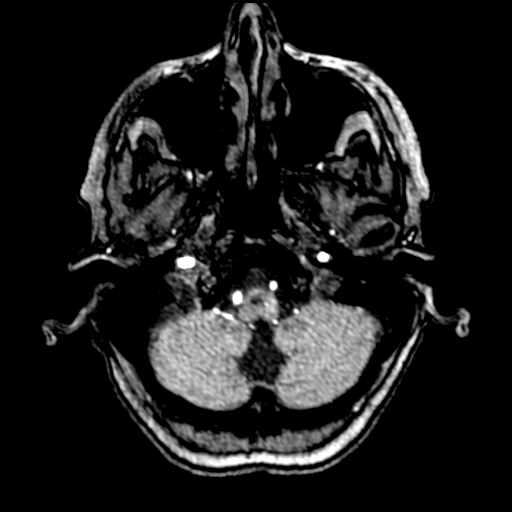
[im 31/205]
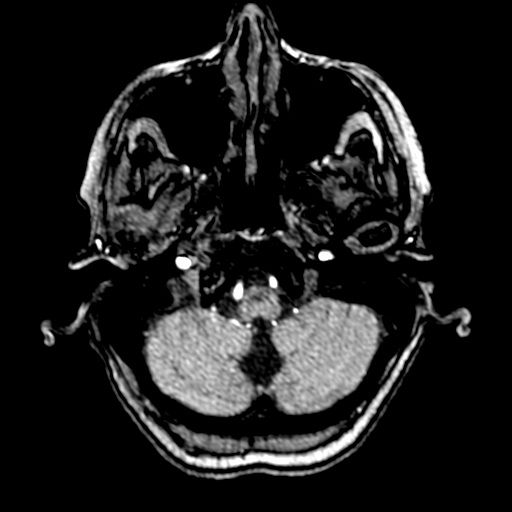
[im 35/205]
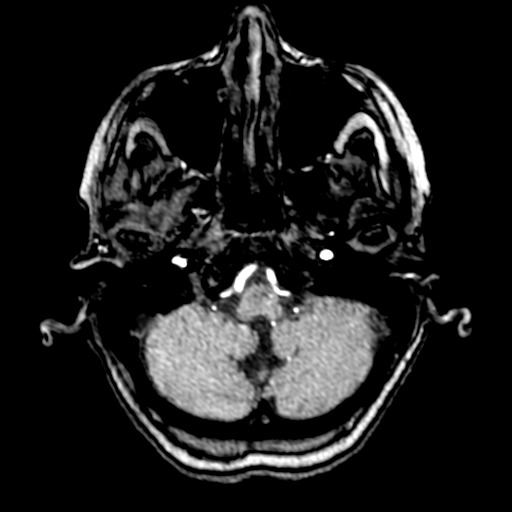
[im 40/205]
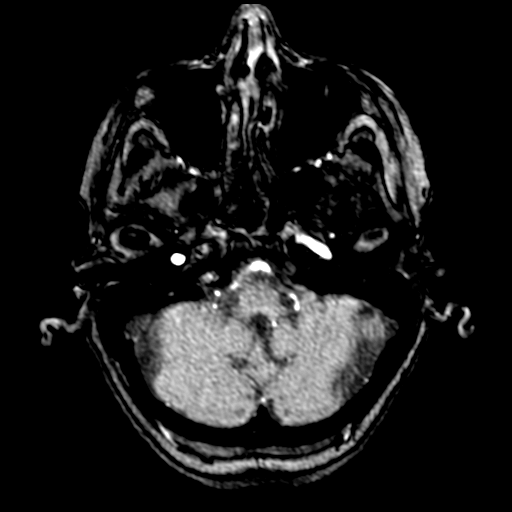
[im 44/205]
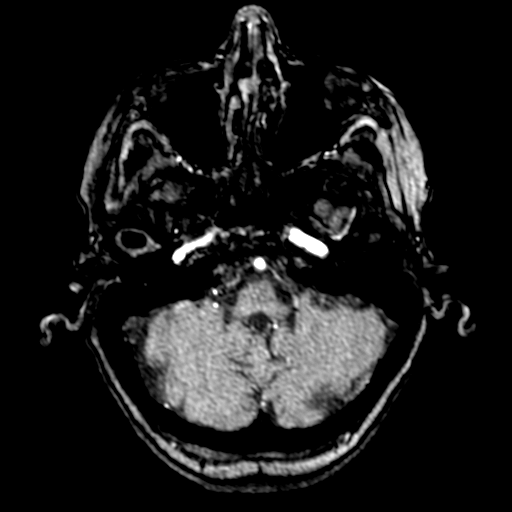
[im 48/205]
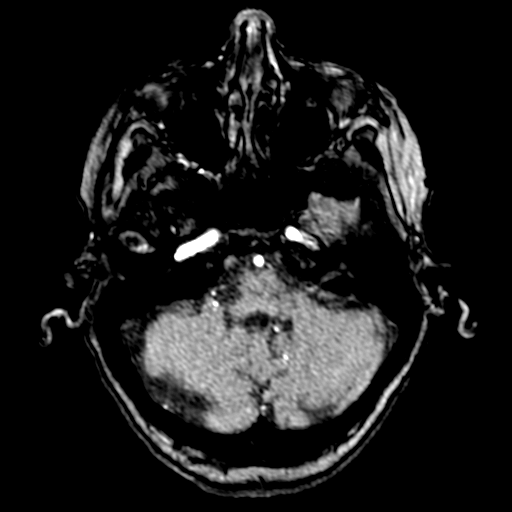
[im 66/205]
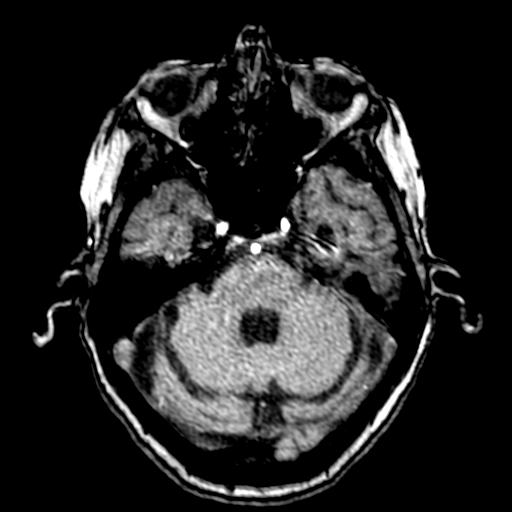
[im 92/205]
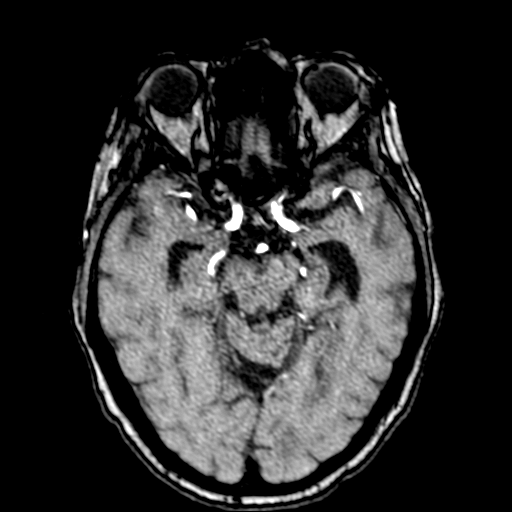
[im 105/205]
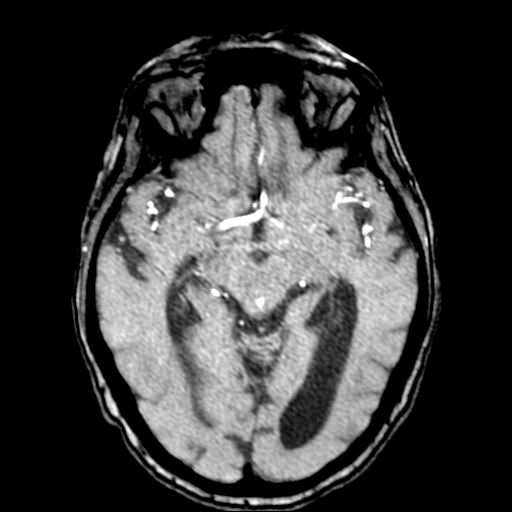
[im 118/205]
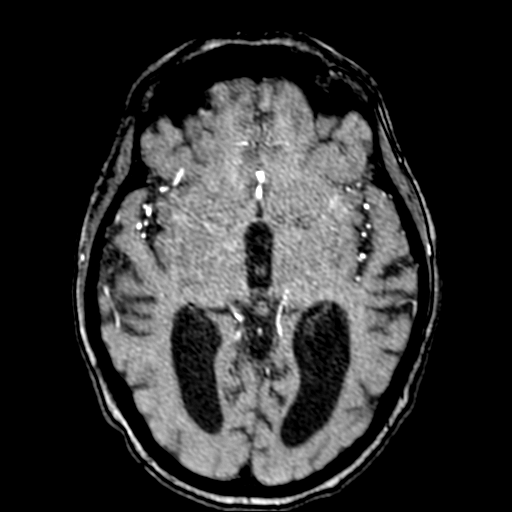
[im 144/205]
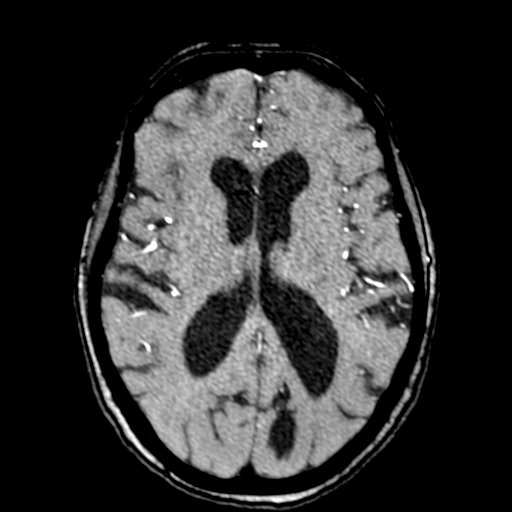
[im 170/205]
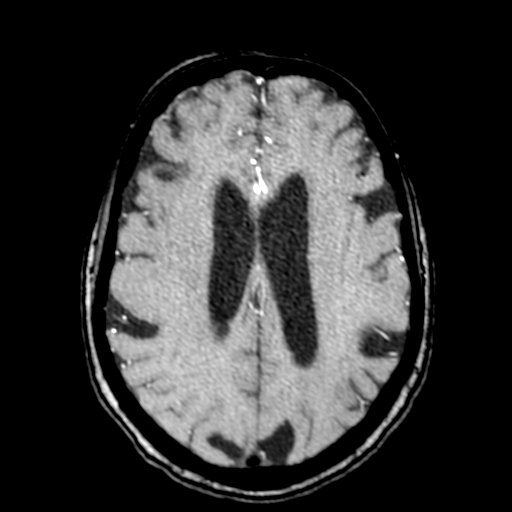
[im 174/205]
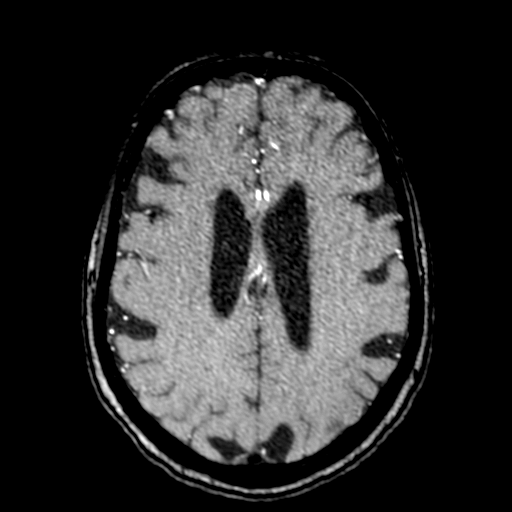
[im 196/205]
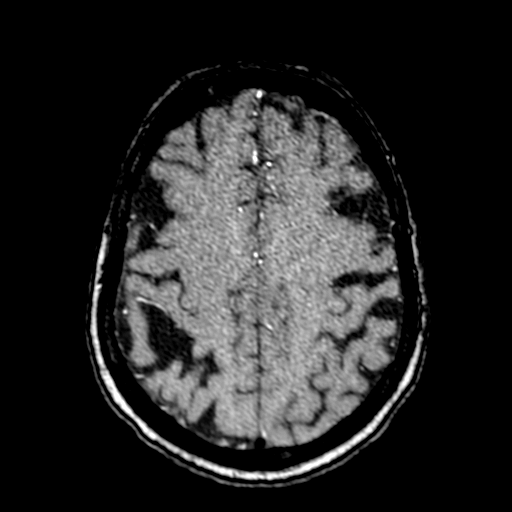

[20 of 48 positions shown; findings below may reference images not displayed]

FINDINGS: POSTERIOR CIRCULATION:

--Vertebral arteries: Normal

--Inferior cerebellar arteries: Normal.

--Basilar artery: Normal.

--Superior cerebellar arteries: Normal.

--Posterior cerebral arteries: Moderate stenosis of the left
proximal P2 segment. Otherwise normal.

ANTERIOR CIRCULATION:

--Intracranial internal carotid arteries: Normal.

--Anterior cerebral arteries (ACA): Normal.

--Middle cerebral arteries (MCA): Normal.

ANATOMIC VARIANTS: None
IMPRESSION: 1. No intracranial arterial occlusion or high-grade stenosis.
2. Moderate stenosis of the left proximal P2 segment.
